# Patient Record
Sex: Male | Born: 1950 | Race: White | Hispanic: Yes | Smoking: Former smoker
Health system: Southern US, Community
[De-identification: ages and names within clinical notes are randomized; demographics above are authoritative.]

## PROBLEM LIST (undated history)

## (undated) DIAGNOSIS — I4891 Unspecified atrial fibrillation: Secondary | ICD-10-CM

## (undated) DIAGNOSIS — J45909 Unspecified asthma, uncomplicated: Secondary | ICD-10-CM

## (undated) DIAGNOSIS — Z993 Dependence on wheelchair: Secondary | ICD-10-CM

## (undated) DIAGNOSIS — I10 Essential (primary) hypertension: Secondary | ICD-10-CM

## (undated) DIAGNOSIS — F1011 Alcohol abuse, in remission: Secondary | ICD-10-CM

## (undated) DIAGNOSIS — Z87442 Personal history of urinary calculi: Secondary | ICD-10-CM

## (undated) DIAGNOSIS — M1990 Unspecified osteoarthritis, unspecified site: Secondary | ICD-10-CM

## (undated) HISTORY — PX: HIP SURGERY: SHX245

## (undated) HISTORY — PX: ANKLE SURGERY: SHX546

## (undated) HISTORY — PX: KNEE SURGERY: SHX244

---

## 2020-02-06 DIAGNOSIS — N401 Enlarged prostate with lower urinary tract symptoms: Secondary | ICD-10-CM | POA: Insufficient documentation

## 2020-02-06 DIAGNOSIS — E119 Type 2 diabetes mellitus without complications: Secondary | ICD-10-CM | POA: Insufficient documentation

## 2020-02-06 DIAGNOSIS — I10 Essential (primary) hypertension: Secondary | ICD-10-CM | POA: Diagnosis present

## 2020-02-06 DIAGNOSIS — G500 Trigeminal neuralgia: Secondary | ICD-10-CM | POA: Insufficient documentation

## 2020-02-06 DIAGNOSIS — I4891 Unspecified atrial fibrillation: Secondary | ICD-10-CM | POA: Diagnosis present

## 2020-02-06 DIAGNOSIS — Z7901 Long term (current) use of anticoagulants: Secondary | ICD-10-CM

## 2020-02-06 DIAGNOSIS — I480 Paroxysmal atrial fibrillation: Secondary | ICD-10-CM | POA: Diagnosis present

## 2020-02-06 DIAGNOSIS — J45909 Unspecified asthma, uncomplicated: Secondary | ICD-10-CM | POA: Insufficient documentation

## 2020-02-06 DIAGNOSIS — M109 Gout, unspecified: Secondary | ICD-10-CM | POA: Insufficient documentation

## 2020-02-06 DIAGNOSIS — G8929 Other chronic pain: Secondary | ICD-10-CM | POA: Insufficient documentation

## 2020-02-06 DIAGNOSIS — F1011 Alcohol abuse, in remission: Secondary | ICD-10-CM | POA: Insufficient documentation

## 2020-02-19 ENCOUNTER — Ambulatory Visit: Payer: Self-pay | Admitting: Cardiology

## 2020-07-02 ENCOUNTER — Other Ambulatory Visit: Payer: Self-pay

## 2020-07-02 ENCOUNTER — Ambulatory Visit (HOSPITAL_COMMUNITY)
Admission: RE | Admit: 2020-07-02 | Discharge: 2020-07-02 | Disposition: A | Discharge status: Home / Self Care | Payer: Medicare Other | Source: Ambulatory Visit | Attending: Internal Medicine | Admitting: Internal Medicine

## 2020-07-02 ENCOUNTER — Other Ambulatory Visit (HOSPITAL_COMMUNITY): Payer: Self-pay | Admitting: Internal Medicine

## 2020-07-02 DIAGNOSIS — M7989 Other specified soft tissue disorders: Secondary | ICD-10-CM | POA: Insufficient documentation

## 2020-09-19 DIAGNOSIS — E8809 Other disorders of plasma-protein metabolism, not elsewhere classified: Secondary | ICD-10-CM | POA: Insufficient documentation

## 2020-11-23 ENCOUNTER — Other Ambulatory Visit: Payer: Self-pay

## 2020-11-23 ENCOUNTER — Ambulatory Visit (HOSPITAL_COMMUNITY): Payer: Self-pay

## 2020-11-23 ENCOUNTER — Encounter (HOSPITAL_COMMUNITY): Payer: Self-pay | Admitting: Emergency Medicine

## 2020-11-23 ENCOUNTER — Emergency Department (HOSPITAL_COMMUNITY)
Admission: EM | Admit: 2020-11-23 | Discharge: 2020-11-23 | Disposition: A | Discharge status: Home / Self Care | Payer: Medicare Other | Attending: Student | Admitting: Student

## 2020-11-23 DIAGNOSIS — H1031 Unspecified acute conjunctivitis, right eye: Secondary | ICD-10-CM | POA: Insufficient documentation

## 2020-11-23 DIAGNOSIS — Z5321 Procedure and treatment not carried out due to patient leaving prior to being seen by health care provider: Secondary | ICD-10-CM | POA: Insufficient documentation

## 2020-11-23 DIAGNOSIS — H538 Other visual disturbances: Secondary | ICD-10-CM | POA: Insufficient documentation

## 2020-11-23 HISTORY — DX: Unspecified osteoarthritis, unspecified site: M19.90

## 2020-11-23 HISTORY — DX: Essential (primary) hypertension: I10

## 2020-11-23 LAB — CBC WITH DIFFERENTIAL/PLATELET
Abs Immature Granulocytes: 0.03 10*3/uL (ref 0.00–0.07)
Basophils Absolute: 0.1 10*3/uL (ref 0.0–0.1)
Basophils Relative: 1 %
Eosinophils Absolute: 0.3 10*3/uL (ref 0.0–0.5)
Eosinophils Relative: 4 %
HCT: 38.8 % — ABNORMAL LOW (ref 39.0–52.0)
Hemoglobin: 12.7 g/dL — ABNORMAL LOW (ref 13.0–17.0)
Immature Granulocytes: 0 %
Lymphocytes Relative: 24 %
Lymphs Abs: 1.8 10*3/uL (ref 0.7–4.0)
MCH: 31.1 pg (ref 26.0–34.0)
MCHC: 32.7 g/dL (ref 30.0–36.0)
MCV: 95.1 fL (ref 80.0–100.0)
Monocytes Absolute: 0.6 10*3/uL (ref 0.1–1.0)
Monocytes Relative: 8 %
Neutro Abs: 4.9 10*3/uL (ref 1.7–7.7)
Neutrophils Relative %: 63 %
Platelets: 203 10*3/uL (ref 150–400)
RBC: 4.08 MIL/uL — ABNORMAL LOW (ref 4.22–5.81)
RDW: 15 % (ref 11.5–15.5)
WBC: 7.7 10*3/uL (ref 4.0–10.5)
nRBC: 0 % (ref 0.0–0.2)

## 2020-11-23 LAB — BASIC METABOLIC PANEL
Anion gap: 7 (ref 5–15)
BUN: 12 mg/dL (ref 8–23)
CO2: 29 mmol/L (ref 22–32)
Calcium: 8.7 mg/dL — ABNORMAL LOW (ref 8.9–10.3)
Chloride: 107 mmol/L (ref 98–111)
Creatinine, Ser: 0.84 mg/dL (ref 0.61–1.24)
GFR, Estimated: >60 mL/min (ref >60)
Glucose, Bld: 92 mg/dL (ref 70–99)
Potassium: 3.5 mmol/L (ref 3.5–5.1)
Sodium: 143 mmol/L (ref 135–145)

## 2020-11-23 NOTE — ED Notes (Signed)
Pt stated "he was going to have to leave. He has his father at home with sundowners and he needed to get home to take care of him". Moving him off the floor.

## 2020-11-23 NOTE — ED Triage Notes (Signed)
C/o R eye infection since Tuesday.  Using antibiotic ointment since Wednesday.  Reports blurred vision.

## 2020-11-23 NOTE — ED Provider Notes (Signed)
Patient placed in Quick Look pathway, seen and evaluated   Chief Complaint: R eye infection  HPI: 70 year old male arrives with worsening right eye infection.  He has been using her conjunctivitis since Wednesday but feels that the eye infection worsening over the weekend despite using antibiotics.  He reports increasing redness and swelling of the eye and increasing purulent drainage.  Blurred vision reported.  ROS: + Eye pain, redness and drainage, blurry vision  -Fever  Physical Exam:   Gen: No distress  Neuro: Awake and Alert  Skin: Warm    Focused Exam: Right is erythematous with purulent drainage noted, no proptosis, slight periorbital swelling, no crepitus or significant periorbital tenderness   Initiation of care has begun. The patient has been counseled on the process, plan, and necessity for staying for the completion/evaluation, and the remainder of the medical screening examination MSE was initiated and I personally evaluated the patient and placed orders (if any) at  3:26 PM on November 23, 2020.  The patient appears stable so that the remainder of the MSE may be completed by another provider.   Dartha Lodge, PA-C 11/23/20 1539    Koleen Distance, MD 11/23/20 [phone]

## 2020-11-28 DIAGNOSIS — Z9889 Other specified postprocedural states: Secondary | ICD-10-CM | POA: Insufficient documentation

## 2020-12-01 ENCOUNTER — Other Ambulatory Visit: Payer: Self-pay

## 2020-12-01 ENCOUNTER — Ambulatory Visit: Payer: Medicare Other | Attending: Internal Medicine

## 2020-12-01 DIAGNOSIS — M6281 Muscle weakness (generalized): Secondary | ICD-10-CM | POA: Diagnosis present

## 2020-12-01 DIAGNOSIS — M25571 Pain in right ankle and joints of right foot: Secondary | ICD-10-CM | POA: Diagnosis present

## 2020-12-01 DIAGNOSIS — R269 Unspecified abnormalities of gait and mobility: Secondary | ICD-10-CM | POA: Diagnosis present

## 2020-12-02 NOTE — Therapy (Signed)
[facility] [address] [place], [place], [address] Phone: [phone]   Fax:  [phone]  Physical Therapy Evaluation  Patient Details  Name: Taylor Myers MRN: [id number] Date of Birth: [birth year] Referring Provider (PT): Tisovec, Adelfa Koh, MD   Encounter Date: 12/01/2020   PT End of Session - 12/01/20 1453    Visit Number 1    Number of Visits 13    Date for PT Re-Evaluation 01/24/21    Authorization Type MEDICARE PART A AND B; [employer] OTHER    Authorization Time Period Re-assess FOTO on the 6th and 10th visits    PT Start Time 1400    PT Stop Time 1455    PT Time Calculation (min) 55 min    Equipment Utilized During Treatment Other (comment)   manual WC   Activity Tolerance Patient tolerated treatment well    Behavior During Therapy [facility] for tasks assessed/performed           Past Medical History:  Diagnosis Date  . Arthritis   . Hypertension     Past Surgical History:  Procedure Laterality Date  . ANKLE SURGERY    . HIP SURGERY    . KNEE SURGERY      There were no vitals filed for this visit.    Subjective Assessment - 12/02/20 0940    Subjective Pt reports a decline in strength, balance, and functional mobility over the last 9 months related to various health problems. Pt notes losing 75lbs during this time frame. See below pertintent Hx. of Trigeminal nerve pain affected eating, while R ankle pain limited activity level. Pt reports his 72 yo father lives with him. Pt expressed a desire to participate in aquatics rehab.    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 Chronic isses- R THA, L and R TKA, R ankle re-built with chronic pain.    Limitations Lifting;Standing;Walking;House hold activities    How long can you sit comfortably? Not an issue    How long can you stand  comfortably? Not long    How long can you walk comfortably? 60 ft    Patient Stated Goals To improve strength so he is able to walk better, assess the community more easily, and do more around his home with cleaning and cooking    Currently in Pain? Yes    Pain Score 4    Pain range 4-7/10   Pain Location Ankle    Pain Orientation Right    Pain Descriptors / Indicators Aching;Sharp    Pain Type Chronic pain    Pain Onset More than a month ago    Pain Frequency Constant    Aggravating Factors  wt bearing    Pain Relieving Factors rest              [facility] PT Assessment - 12/02/20 0001      Assessment   Medical Diagnosis Muscle weakness (generalized); Unspecified abnormalities of gait and mobility    Referring Provider (PT) Tisovec, Adelfa Koh, MD    Onset Date/Surgical Date --   9 months   Hand Dominance Right    Prior Therapy yes      Precautions   Precautions None      Restrictions   Weight Bearing Restrictions No      Balance Screen   Has the patient fallen in the past 6 months No  Home Environment   Living Environment Private residence    Living Arrangements Parent   39 yo father   Type of Home House    Home Access Elevator    Home Layout One level    Home Equipment Wheelchair - manual;Wheelchair - power;Grab bars - tub/shower;Grab bars - toilet;Walker - 2 wheels;Tub bench;Hand held shower head      Prior Function   Level of Independence Independent with basic ADLs    Vocation Retired;On disability      Cognition   Overall Cognitive Status Within Functional Limits for tasks assessed      Observation/Other Assessments   Focus on Therapeutic Outcomes (FOTO)  36% functional ability      Sensation   Light Touch Appears Intact      Posture/Postural Control   Posture/Postural Control Postural limitations    Postural Limitations Flexed trunk   flexed hips and knees     ROM / Strength   AROM / PROM / Strength AROM;Strength      AROM   AROM Assessment  Site Shoulder;Knee    Right/Left Shoulder Right;Left    Right Shoulder Flexion 100 Degrees    Left Shoulder Flexion 100 Degrees    Left Shoulder ABduction --    Right/Left Knee Right;Left    Right Knee Extension -20    Left Knee Extension -20      Strength   Overall Strength Comments B Shoulder flexion = 4/5 bilat; B elb = 5/5; B grip = 5/5; B hip = 4/5; B knee 4+/5; R ankle 4/5, L ankle 5/5      Palpation   Palpation comment TTP to the R ankle area; muscle atrophy noted of the post shoulder girdle      Transfers   Transfers Sit to Stand;Stand to Sit    Sit to Stand 6: Modified independent (Device/Increase time)   squat pivot     Ambulation/Gait   Ambulation/Gait Yes    Ambulation/Gait Assistance 5: Supervision    Ambulation Distance (Feet) 60 Feet    Assistive device Rolling walker    Gait Pattern Step-to pattern   knees flexed with forward flexed trunk.   Gait velocity Decreased pace                      Objective measurements completed on examination: See above findings.               PT Education - 12/02/20 9937    Education Details Eval findings, POC, to continue with HEP provided by HHPT, use of RICE for pain management with R ankle.    Person(s) Educated Patient    Methods Explanation    Comprehension Verbalized understanding            PT Short Term Goals - 12/02/20 1610      PT SHORT TERM GOAL #1   Title Pt will be Ind in an initial HEP    Status New    Target Date 12/23/20      PT SHORT TERM GOAL #2   Title Pt will voice understanding of measure for the management of R ankle pain.    Status New    Target Date 12/23/20             PT Long Term Goals - 12/02/20 1611      PT LONG TERM GOAL #1   Title Pt will be Ind in a final HEP to maintain or progress achieved LOF  Status New    Target Date 01/24/21      PT LONG TERM GOAL #2   Title Improve B shoulder, B hip, B knee and R ankle strength by at least .5 MMT grade     Baseline see flowsheets    Status New    Target Date 01/24/21      PT LONG TERM GOAL #3   Title Pt will improve his walking distance to 300 ft c a RW for improved function for community ambulation    Baseline 60 ft with RW    Status New    Target Date 01/24/21      PT LONG TERM GOAL #4   Title Pt's FOTO score for functional ability will improved to predicted value of 44%    Baseline 36% functional ability    Status New    Target Date 01/24/21                  Plan - 12/02/20 0925    Clinical Impression Statement Pt presents to PT using a WC for mobility. Pt's functional mobility is impacted my weakness of mutliple areas and R ankle pain. Pt is able to walk 73ft c supervision for safety with his distance limited by both weakness and chronic R ankle pain. Pt will benefit from PT 2w6 for strengthening and balance to improve his able to manage his home and to progress to a community ambulator walking 300 ft with a RW. Degree of R ankle pain may limit progress. Will make referral to aquatic therapy.    Personal Factors and Comorbidities Past/Current Experience;Social Background;Comorbidity 1;Comorbidity 2    Comorbidities arthritis, HTN    Examination-Activity Limitations Caring for Others;Carry;Stand;Stairs;Squat;Transfers;Locomotion Level;Lift    Stability/Clinical Decision Making Evolving/Moderate complexity    Clinical Decision Making Moderate    Rehab Potential Fair    PT Frequency 2x / week    PT Duration 6 weeks    PT Treatment/Interventions ADLs/Self Care Home Management;Cryotherapy;Physicist, medical;Therapeutic exercise;Therapeutic activities;Functional mobility training;Stair training;Gait training;Patient/family education;Manual techniques;Dry needling;Passive range of motion;Taping;Vasopneumatic Device;Joint Manipulations;Aquatic Therapy    PT Next Visit Plan Initiate HEP. Complete TUG and 5xSTS and set goals. Review results of  FOTO. Make referral to aquatic therapy.    Consulted and Agree with Plan of Care Patient           Patient will benefit from skilled therapeutic intervention in order to improve the following deficits and impairments:  Abnormal gait,Decreased range of motion,Difficulty walking,Decreased activity tolerance,Pain,Decreased strength,Postural dysfunction  Visit Diagnosis: Muscle weakness (generalized) - Plan: PT plan of care cert/re-cert  Unspecified abnormalities of gait and mobility - Plan: PT plan of care cert/re-cert  Pain in right ankle and joints of right foot - Plan: PT plan of care cert/re-cert     Problem List There are no problems to display for this patient.   Joellyn Rued MS, PT 12/02/20 4:26 PM   [facility] Out[facility] Rehabilitation [facility] [address] [place], [place], [address] Phone: [phone]   Fax:  [phone]  Name: Taylor Myers MRN: [id number] Date of Birth: [birth year]

## 2020-12-16 ENCOUNTER — Other Ambulatory Visit: Payer: Self-pay

## 2020-12-16 ENCOUNTER — Ambulatory Visit: Payer: Medicare Other | Admitting: Physical Therapy

## 2020-12-16 DIAGNOSIS — M25571 Pain in right ankle and joints of right foot: Secondary | ICD-10-CM

## 2020-12-16 DIAGNOSIS — M6281 Muscle weakness (generalized): Secondary | ICD-10-CM | POA: Diagnosis not present

## 2020-12-16 DIAGNOSIS — R269 Unspecified abnormalities of gait and mobility: Secondary | ICD-10-CM

## 2020-12-16 NOTE — Therapy (Signed)
[facility]patient [facility] [address] [place], [place], [address] Phone: [phone]   Fax:  [phone]  Physical Therapy Treatment  Patient Details  Name: Taylor Myers MRN: [id number] Date of Birth: Jul 03, 1951 Referring Provider (PT): Tisovec, Adelfa Koh, MD   Encounter Date: 12/16/2020   PT End of Session - 12/16/20 1302    Visit Number 2    Number of Visits 13    Date for PT Re-Evaluation 01/24/21    Authorization Type MEDICARE PART A AND B; [employer] HE[employer] OTHER    Authorization Time Period Re-assess FOTO on the 6th and 10th visits    PT Start Time 1150    PT Stop Time 1235    PT Time Calculation (min) 45 min    Equipment Utilized During Treatment Other (comment)   manual WC   Activity Tolerance Patient tolerated treatment well    Behavior During Therapy [facility] for tasks assessed/performed           Past Medical History:  Diagnosis Date  . Arthritis   . Hypertension     Past Surgical History:  Procedure Laterality Date  . ANKLE SURGERY    . HIP SURGERY    . KNEE SURGERY      There were no vitals filed for this visit.   Subjective Assessment - 12/16/20 1156    Subjective Pt states he got a R eye implant yesterday -- no precautions. Pt reports he will see orthopedist in 3 weeks to address R ankle.    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 Chronic isses- R THA, L and R TKA, R ankle re-built with chronic pain.    Limitations Lifting;Standing;Walking;House hold activities    How long can you sit comfortably? Not an issue    How long can you stand comfortably? Not long    How long can you walk comfortably? 60 ft    Patient Stated Goals To improve strength so he is able to walk better, assess the community more easily, and do more around his home with cleaning and cooking    Pain Onset More than a month ago               [facility] PT Assessment - 12/16/20 0001      Balance   Balance Assessed Yes      Standardized Balance Assessment   Standardized Balance Assessment Timed Up and Go Test      Timed Up and Go Test   Normal TUG (seconds) 29                         [facility] Adult PT Treatment/Exercise - 12/16/20 0001      Transfers   Five time sit to stand comments  13 sec   Needs use of UEs; had at least 2 LOBs     Ambulation/Gait   Ambulation Distance (Feet) 25 Feet    Assistive device Rolling walker    Gait Pattern Step-through pattern   R ankle/tibia everts/externally rotates prior to stance/weightbearing with steppage pattern     Exercises   Exercises Knee/Hip      Knee/Hip Exercises: Stretches   Active Hamstring Stretch Right;Left;30 seconds    Hip Flexor Stretch Both;30 seconds    Hip Flexor Stretch Limitations Prone (modified thomas pulled on low back too much)      Knee/Hip Exercises: Seated  Clamshell with TheraBand Green    Other Seated Knee/Hip Exercises SLR 2x10 bilat    Other Seated Knee/Hip Exercises Quad set x5 on R    Sit to Sand with UE support;20 reps   "push through heels"     Manual Therapy   Manual Therapy Joint mobilization    Joint Mobilization grade III inferior and lateral hip mob bilat                    PT Short Term Goals - 12/16/20 1256      PT SHORT TERM GOAL #1   Title Pt will be Ind in an initial HEP    Status New    Target Date 12/23/20      PT SHORT TERM GOAL #2   Title Pt will voice understanding of measure for the management of R ankle pain.    Status New    Target Date 12/23/20      PT SHORT TERM GOAL #3   Title Pt will have improved TUG score to </=15 sec    Baseline 20 sec on 12/16/20    Status New    Target Date 01/06/21      PT SHORT TERM GOAL #4   Title Pt will have improved 5x STS to </=13 sec without use of UEs    Baseline 13 sec with UE support and 2 LOBs    Status New    Target Date 01/06/21              PT Long Term Goals - 12/02/20 1611      PT LONG TERM GOAL #1   Title Pt will be Ind in a final HEP to maintain or progress achieved LOF    Status New    Target Date 01/24/21      PT LONG TERM GOAL #2   Title Improve B shoulder, B hip, B knee and R ankle strength by at least .5 MMT grade    Baseline see flowsheets    Status New    Target Date 01/24/21      PT LONG TERM GOAL #3   Title Pt will improve his walking distance to 300 ft c a RW for improved function for community ambulation    Baseline 60 ft with RW    Status New    Target Date 01/24/21      PT LONG TERM GOAL #4   Title Pt's FOTO score for functional ability will improved to predicted value of 44%    Baseline 36% functional ability    Status New    Target Date 01/24/21                 Plan - 12/16/20 1252    Clinical Impression Statement Treatment focused on initiating strengthening HEP. On attempting clamshell, pt notes hip tightness. Provided manual therapy and stretching. Discussed aquatics. Assessed 5x STS and TUG.    Personal Factors and Comorbidities Past/Current Experience;Social Background;Comorbidity 1;Comorbidity 2    Comorbidities arthritis, HTN    Examination-Activity Limitations Caring for Others;Carry;Stand;Stairs;Squat;Transfers;Locomotion Level;Lift    Stability/Clinical Decision Making Evolving/Moderate complexity    Rehab Potential Fair    PT Frequency 2x / week    PT Duration 6 weeks    PT Treatment/Interventions ADLs/Self Care Home Management;Cryotherapy;Physicist, medical;Therapeutic exercise;Therapeutic activities;Functional mobility training;Stair training;Gait training;Patient/family education;Manual techniques;Dry needling;Passive range of motion;Taping;Vasopneumatic Device;Joint Manipulations;Aquatic Therapy    PT Next Visit Plan Assess response to HEP. Review results of FOTO. Continue  knee and hip strengthening. Add hip  stretches and mobilizations as needed. Follow-up on aquatics.    PT Home Exercise Plan Access Code: [email or web address]    Consulted and Agree with Plan of Care Patient           Patient will benefit from skilled therapeutic intervention in order to improve the following deficits and impairments:  Abnormal gait,Decreased range of motion,Difficulty walking,Decreased activity tolerance,Pain,Decreased strength,Postural dysfunction  Visit Diagnosis: Muscle weakness (generalized)  Unspecified abnormalities of gait and mobility  Pain in right ankle and joints of right foot     Problem List There are no problems to display for this patient.   [facility] [address] PT, DPT 12/16/2020, 1:04 PM  [facility] [address] [place], [place], [address] Phone: [phone]   Fax:  [phone]  Name: Jakob Kimberlin MRN: [id number] Date of Birth: [birth year]

## 2020-12-18 ENCOUNTER — Telehealth: Payer: Self-pay | Admitting: Internal Medicine

## 2020-12-18 NOTE — Telephone Encounter (Signed)
   Taylor Myers DOB: 26-Apr-1951 MRN: [id number]   RIDER WAIVER AND RELEASE OF LIABILITY  For purposes of improving physical access to our facilities, [facility] is pleased to partner with third parties to provide [facility] patients or other authorized individuals the option of convenient, on-demand ground transportation services (the [employer]") through use of the technology service that enables users to request on-demand ground transportation from independent third-party providers.  By opting to use and accept these [employer], I, the undersigned, hereby agree on behalf of myself, and on behalf of any minor child using the [employer] for whom I am the parent or legal guardian, as follows:  1. Science writer provided to me are provided by independent third-party transportation providers who are not [employer] or employees and who are unaffiliated with [employer]. 2. [facility] is neither a transportation carrier nor a common or public carrier. 3. [facility] has no control over the quality or safety of the transportation that occurs as a result of the [employer]. 4. [facility] [facility] cannot guarantee that any third-party transportation provider will complete any arranged transportation service. 5. [facility] makes no representation, warranty, or guarantee regarding the reliability, timeliness, quality, safety, suitability, or availability of any of the Transport Services or that they will be error free. 6. I fully understand that traveling by vehicle involves risks and dangers of serious bodily injury, including permanent disability, paralysis, and death. I agree, on behalf of myself and on behalf of any minor child using the Transport Services for whom I am the parent or legal guardian, that the entire risk arising out of my use of the [employer] remains solely with me, to the maximum extent permitted under applicable law. 7. The [employer]  are provided "as is" and "as available." [facility] disclaims all representations and warranties, express, implied or statutory, not expressly set out in these terms, including the implied warranties of merchantability and fitness for a particular purpose. 8. I hereby waive and release [facility], its agents, employees, officers, directors, representatives, insurers, attorneys, assigns, successors, subsidiaries, and affiliates from any and all past, present, or future claims, demands, liabilities, actions, causes of action, or suits of any kind directly or indirectly arising from acceptance and use of the [employer]. 9. I further waive and release [facility] and its affiliates from all present and future liability and responsibility for any injury or death to persons or damages to property caused by or related to the use of the [employer]. 10. I have read this Waiver and Release of Liability, and I understand the terms used in it and their legal significance. This Waiver is freely and voluntarily given with the understanding that my right (as well as the right of any minor child for whom I am the parent or legal guardian using the [employer]) to legal recourse against [place] in connection with the [employer] is knowingly surrendered in return for use of these services.   I attest that I read the consent document to Taylor Myers, gave Taylor Myers the opportunity to ask questions and answered the questions asked (if any). I affirm that Taylor Myers then provided consent for he's participation in this program.     Taylor Myers

## 2020-12-19 ENCOUNTER — Other Ambulatory Visit: Payer: Self-pay

## 2020-12-19 ENCOUNTER — Ambulatory Visit: Payer: Medicare Other | Admitting: Physical Therapy

## 2020-12-19 DIAGNOSIS — M25571 Pain in right ankle and joints of right foot: Secondary | ICD-10-CM

## 2020-12-19 DIAGNOSIS — R269 Unspecified abnormalities of gait and mobility: Secondary | ICD-10-CM

## 2020-12-19 DIAGNOSIS — M6281 Muscle weakness (generalized): Secondary | ICD-10-CM | POA: Diagnosis not present

## 2020-12-19 NOTE — Therapy (Addendum)
[place], [place], [address] Phone: [phone]   Fax:  [phone]  Physical Therapy Treatment/Discharge  Patient Details  Name: Taylor Myers MRN: [id number] Date of Birth: [birth year] Referring Provider (PT): Tisovec, Fransico Him, MD   Encounter Date: 12/19/2020   PT End of Session - 12/19/20 1157    Visit Number 3    Number of Visits 13    Date for PT Re-Evaluation 01/24/21    Authorization Type MEDICARE PART A AND B; [employer] OTHER    Authorization Time Period Re-assess FOTO on the 6th and 10th visits    PT Start Time 1100    PT Stop Time 1145    PT Time Calculation (min) 45 min    Equipment Utilized During Treatment Other (comment)   manual WC   Activity Tolerance Patient tolerated treatment well    Behavior During Therapy [facility] for tasks assessed/performed           Past Medical History:  Diagnosis Date  . Arthritis   . Hypertension     Past Surgical History:  Procedure Laterality Date  . ANKLE SURGERY    . HIP SURGERY    . KNEE SURGERY      There were no vitals filed for this visit.   Subjective Assessment - 12/19/20 1104    Subjective Pt will f/u with opthamologist to see if it would be okay to do aquatics. Otherwise, pt is okay to do aquatics at [employer] with Denton Meek. Pt reports he was okay after last session. Pt states he was able to do everything but the hip stretch.    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 Chronic isses- R THA, L and R TKA, R ankle re-built with chronic pain.    Limitations Lifting;Standing;Walking;House hold activities    How long can you sit comfortably? Not an issue    How long can you stand comfortably? Not long    How long can you walk comfortably? 12 ft    Patient Stated Goals To improve strength so he is able to walk  better, assess the community more easily, and do more around his home with cleaning and cooking    Pain Onset More than a month ago                             [facility] Adult PT Treatment/Exercise - 12/19/20 0001      Knee/Hip Exercises: Aerobic   Nustep L5 x 5 min      Knee/Hip Exercises: Standing   Other Standing Knee Exercises Attempted standing with ankle inversion x10      Knee/Hip Exercises: Seated   Other Seated Knee/Hip Exercises ankle inversion 2x10 yellow tband on R      Knee/Hip Exercises: Supine   Bridges Strengthening;Both;2 sets;10 reps      Knee/Hip Exercises: Sidelying   Clams green tband 2x10 bilat      Manual Therapy   Manual Therapy Joint mobilization    Joint Mobilization grade III inferior and lateral hip mob bilat; patellar mobilizatoins bilat in all directions; R ankle talocrural distraction & PA mobs                    PT Short Term Goals - 12/16/20 1256      PT SHORT TERM GOAL #1  Title Pt will be Ind in an initial HEP    Status New    Target Date 12/23/20      PT SHORT TERM GOAL #2   Title Pt will voice understanding of measure for the management of R ankle pain.    Status New    Target Date 12/23/20      PT SHORT TERM GOAL #3   Title Pt will have improved TUG score to </=15 sec    Baseline 20 sec on 12/16/20    Status New    Target Date 01/06/21      PT SHORT TERM GOAL #4   Title Pt will have improved 5x STS to </=13 sec without use of UEs    Baseline 13 sec with UE support and 2 LOBs    Status New    Target Date 01/06/21             PT Long Term Goals - 12/02/20 1611      PT LONG TERM GOAL #1   Title Pt will be Ind in a final HEP to maintain or progress achieved LOF    Status New    Target Date 01/24/21      PT LONG TERM GOAL #2   Title Improve B shoulder, B hip, B knee and R ankle strength by at least .5 MMT grade    Baseline see flowsheets    Status New    Target Date 01/24/21      PT LONG  TERM GOAL #3   Title Pt will improve his walking distance to 300 ft c a RW for improved function for community ambulation    Baseline 60 ft with RW    Status New    Target Date 01/24/21      PT LONG TERM GOAL #4   Title Pt's FOTO score for functional ability will improved to predicted value of 44%    Baseline 36% functional ability    Status New    Target Date 01/24/21                 Plan - 12/19/20 1155    Clinical Impression Statement Treatment focused on reviewing hip exercises. Added bridges and ankle strengthening this session. Initiated manual therapy and mobilizations to improve joint motion.    Personal Factors and Comorbidities Past/Current Experience;Social Background;Comorbidity 1;Comorbidity 2    Comorbidities arthritis, HTN    Examination-Activity Limitations Caring for Others;Carry;Stand;Stairs;Squat;Transfers;Locomotion Level;Lift    Stability/Clinical Decision Making Evolving/Moderate complexity    Rehab Potential Fair    PT Frequency 2x / week    PT Duration 6 weeks    PT Treatment/Interventions ADLs/Self Care Home Management;Cryotherapy;Presenter, broadcasting;Therapeutic exercise;Therapeutic activities;Functional mobility training;Stair training;Gait training;Patient/family education;Manual techniques;Dry needling;Passive range of motion;Taping;Vasopneumatic Device;Joint Manipulations;Aquatic Therapy    PT Next Visit Plan Assess response to HEP. Review results of FOTO. Continue knee and hip strengthening. Add hip stretches and mobilizations as needed. Pt to f/u with opthamologist about if R eye will be good to do aquatics -- can schedule with [facility] [place].    PT Home Exercise Plan Access Code: [email or web address]    Consulted and Agree with Plan of Care Patient           Patient will benefit from skilled therapeutic intervention in order to improve the following deficits and impairments:  Abnormal gait,Decreased range of  motion,Difficulty walking,Decreased activity tolerance,Pain,Decreased strength,Postural dysfunction  Visit Diagnosis: Muscle weakness (generalized)  Unspecified abnormalities of gait and mobility  Pain  in right ankle and joints of right foot     Problem List There are no problems to display for this patient.   Amandeep Hogston April Ma L Jameria Bradway PT, DPT 12/19/2020, 11:58 AM  [facility] [address] [place], [place], [address] Phone: [phone]   Fax:  [phone]  Name: Taylor Myers MRN: [id number] Date of Birth: [birth year]   PHYSICAL THERAPY DISCHARGE SUMMARY  Visits from Start of Care: 3  Current functional level related to goals / functional outcomes: See above   Remaining deficits: See above   Education / Equipment: HEP Plan: Patient agrees to discharge.  Patient goals were not met. Patient is being discharged due to the physician's request.  ?????        DCed due to upcoming cornea surgery. Pt to obtain a referral for return to PT post surgery when indicated.

## 2020-12-23 ENCOUNTER — Ambulatory Visit: Payer: Medicare Other | Admitting: Physical Therapy

## 2020-12-25 ENCOUNTER — Ambulatory Visit: Payer: Medicare Other | Admitting: Physical Therapy

## 2020-12-30 ENCOUNTER — Ambulatory Visit: Payer: Medicare Other | Admitting: Physical Therapy

## 2021-01-01 ENCOUNTER — Ambulatory Visit: Payer: Medicare Other

## 2021-01-06 ENCOUNTER — Ambulatory Visit: Payer: Medicare Other | Attending: Internal Medicine

## 2021-01-07 ENCOUNTER — Telehealth: Payer: Self-pay

## 2021-01-07 NOTE — Telephone Encounter (Signed)
LVM. Advised pt re: attendance policy. 1st no show visit. Reminded of next appt on 01/13/21.

## 2021-01-08 ENCOUNTER — Ambulatory Visit: Payer: Medicare Other

## 2021-01-13 ENCOUNTER — Encounter: Payer: 59 | Admitting: Physical Therapy

## 2021-03-28 ENCOUNTER — Emergency Department (HOSPITAL_COMMUNITY): Payer: Medicare Other

## 2021-03-28 ENCOUNTER — Other Ambulatory Visit: Payer: Self-pay

## 2021-03-28 ENCOUNTER — Emergency Department (HOSPITAL_COMMUNITY)
Admission: EM | Admit: 2021-03-28 | Discharge: 2021-03-28 | Disposition: A | Discharge status: Other Acute Inpatient Hospital | Payer: Medicare Other | Attending: Emergency Medicine | Admitting: Emergency Medicine

## 2021-03-28 DIAGNOSIS — S1093XA Contusion of unspecified part of neck, initial encounter: Secondary | ICD-10-CM

## 2021-03-28 DIAGNOSIS — R040 Epistaxis: Secondary | ICD-10-CM

## 2021-03-28 DIAGNOSIS — R935 Abnormal findings on diagnostic imaging of other abdominal regions, including retroperitoneum: Secondary | ICD-10-CM

## 2021-03-28 DIAGNOSIS — T1490XA Injury, unspecified, initial encounter: Secondary | ICD-10-CM

## 2021-03-28 DIAGNOSIS — S0285XA Fracture of orbit, unspecified, initial encounter for closed fracture: Secondary | ICD-10-CM

## 2021-03-28 DIAGNOSIS — H05829 Myopathy of extraocular muscles, unspecified orbit: Secondary | ICD-10-CM

## 2021-03-28 DIAGNOSIS — S0231XA Fracture of orbital floor, right side, initial encounter for closed fracture: Secondary | ICD-10-CM | POA: Diagnosis not present

## 2021-03-28 DIAGNOSIS — W19XXXA Unspecified fall, initial encounter: Secondary | ICD-10-CM | POA: Insufficient documentation

## 2021-03-28 DIAGNOSIS — Z20822 Contact with and (suspected) exposure to covid-19: Secondary | ICD-10-CM | POA: Diagnosis not present

## 2021-03-28 DIAGNOSIS — Z8679 Personal history of other diseases of the circulatory system: Secondary | ICD-10-CM | POA: Insufficient documentation

## 2021-03-28 DIAGNOSIS — R0789 Other chest pain: Secondary | ICD-10-CM | POA: Insufficient documentation

## 2021-03-28 DIAGNOSIS — Z7901 Long term (current) use of anticoagulants: Secondary | ICD-10-CM | POA: Insufficient documentation

## 2021-03-28 DIAGNOSIS — S0990XA Unspecified injury of head, initial encounter: Secondary | ICD-10-CM | POA: Diagnosis present

## 2021-03-28 DIAGNOSIS — H50689 Extraocular muscle entrapment, unspecified, unspecified eye: Secondary | ICD-10-CM

## 2021-03-28 LAB — CBC
HCT: 37.8 % — ABNORMAL LOW (ref 39.0–52.0)
Hemoglobin: 12.9 g/dL — ABNORMAL LOW (ref 13.0–17.0)
MCH: 34.2 pg — ABNORMAL HIGH (ref 26.0–34.0)
MCHC: 34.1 g/dL (ref 30.0–36.0)
MCV: 100.3 fL — ABNORMAL HIGH (ref 80.0–100.0)
Platelets: 235 10*3/uL (ref 150–400)
RBC: 3.77 MIL/uL — ABNORMAL LOW (ref 4.22–5.81)
RDW: 13.4 % (ref 11.5–15.5)
WBC: 8.6 10*3/uL (ref 4.0–10.5)
nRBC: 0 % (ref 0.0–0.2)

## 2021-03-28 LAB — BASIC METABOLIC PANEL
Anion gap: 10 (ref 5–15)
BUN: 14 mg/dL (ref 8–23)
CO2: 26 mmol/L (ref 22–32)
Calcium: 8.6 mg/dL — ABNORMAL LOW (ref 8.9–10.3)
Chloride: 103 mmol/L (ref 98–111)
Creatinine, Ser: 0.74 mg/dL (ref 0.61–1.24)
GFR, Estimated: >60 mL/min (ref >60)
Glucose, Bld: 104 mg/dL — ABNORMAL HIGH (ref 70–99)
Potassium: 3.7 mmol/L (ref 3.5–5.1)
Sodium: 139 mmol/L (ref 135–145)

## 2021-03-28 LAB — SARS CORONAVIRUS 2 (TAT 6-24 HRS): SARS Coronavirus 2: NEGATIVE

## 2021-03-28 MED ORDER — MORPHINE SULFATE (PF) 4 MG/ML IV SOLN: 4.0000 mg | INTRAVENOUS | Status: DC | PRN

## 2021-03-28 MED ORDER — TETRACAINE HCL 0.5 % OP SOLN
2.0000 [drp] | Freq: Once | OPHTHALMIC | Status: AC
  Administered 2021-03-28: 2 [drp] via OPHTHALMIC
  Filled 2021-03-28: qty 4

## 2021-03-28 MED ORDER — IOHEXOL 350 MG/ML SOLN
100.0000 mL | Freq: Once | INTRAVENOUS | Status: AC | PRN
  Administered 2021-03-28: 100 mL via INTRAVENOUS

## 2021-03-28 MED ORDER — OXYMETAZOLINE HCL 0.05 % NA SOLN
1.0000 | Freq: Once | NASAL | Status: AC
  Administered 2021-03-28: 1 via NASAL
  Filled 2021-03-28: qty 30

## 2021-03-28 NOTE — Progress Notes (Signed)
   03/28/21 0830  Clinical Encounter Type  Visited With Patient not available  Visit Type Initial;Trauma  Referral From Nurse  Consult/Referral To Chaplain   Chaplain responded to Level 2 trauma. Pt being treated and no support person present. No current need for chaplain. Chaplain remains available.   This note was prepared by Chaplain Resident, Tacy Learn, MDiv. Chaplain remains available as needed through the on-call pager: [phone].

## 2021-03-28 NOTE — ED Notes (Signed)
Patient transported to CT, by RN.

## 2021-03-28 NOTE — ED Notes (Signed)
Pt's nose started to bleed again. I notified Greta Doom, PA and Dr Lockie Mola and obtained orders.

## 2021-03-28 NOTE — ED Notes (Signed)
Clean blood off patient face with Saline and Peroxide.

## 2021-03-28 NOTE — ED Notes (Signed)
Pt has a 0.5 in lac on upper lid of left eye. Left is erythematous and has 2+ swelling. Pt has blood in the entire sclera of left eye. Pt denies new vision issues out of left eye. Pt states has HA, but denies dizziness. Pt denies neck and back pain. Pt denies numbness and tingling.

## 2021-03-28 NOTE — Progress Notes (Signed)
Orthopedic Tech Progress Note Patient Details:  Taylor Myers 1951/02/12 [id number] Level 2 Trauma Patient ID: Taylor Myers, male   DOB: 25-Mar-1951, 70 y.o.   MRN: [id number]  Taylor Myers 03/28/2021, 8:48 AM

## 2021-03-28 NOTE — ED Notes (Signed)
Patient transported to CT by this RN 

## 2021-03-28 NOTE — ED Provider Notes (Addendum)
I personally evaluated the patient during the encounter and completed a history, physical, procedures, medical decision making to contribute to the overall care of the patient and decision making for the patient briefly, the patient is a 70 y.o. male is here after a fall.  History of A. fib on blood thinner.  Larey Seat while try to go to the bathroom.  Thinks that he had a bloody nose.  Does not remember much of what happened because he thinks that he lost consciousness when he fell.  Typically uses a wheelchair and overall suspect mechanical fall.  He is overall well-appearing.  There is no active bleeding from his nose.  There is some swelling around the left eye.  May be some mild entrapment with upward gaze and there is some inflammation of the conjunctiva of the left eye.  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  Will get CT scan of head, face, neck check basic labs.  EKG shows atrial fibrillation but rate controlled.  Nasal tampon has been placed in left nare as bleeding was not able to be controlled with Afrin and direct pressure.  This was placed by my PA.  CT scan of face does show left orbital floor fracture with large fracture fragment displaced up to 1 cm into the left maxillary sinus.  The inferior left ocular musculature does extend inferiorly through the white fracture site causing some inflammation and stranding on the left optic nerve.  He appears to still have good vision in the left eye but as previously stated there is some mild entrapment possibly with upward gaze.  Otherwise CT imaging shows may be a hematoma in the left supraclavicular area.  We will get a CTA of the neck, chest, abdomen, pelvis to further evaluate for any other injuries.  We will touch base with ophthalmology.  Please see PA note for further results, evaluation, disposition of the patient.  This chart was dictated using voice recognition software.  Despite best efforts to proofread,  errors can occur which  can change the documentation meaning.    EKG Interpretation  Date/Time:  Saturday March 28 2021 08:57:49 EDT Ventricular Rate:  63 PR Interval:    QRS Duration: 152 QT Interval:  433 QTC Calculation: 444 R Axis:   88 Text Interpretation: Atrial fibrillation Ventricular premature complex Right bundle branch block Confirmed by Lockie Mola, Chilton Sallade ([id number]) on 03/28/2021 9:00:22 AM            Virgina Norfolk, DO 03/28/21 1607    Virgina Norfolk, DO 03/28/21 1113    Agata Lucente, DO 03/28/21 1118

## 2021-03-28 NOTE — ED Provider Notes (Signed)
[facility] EMERGENCY DEPARTMENT Provider Note   CSN: [id number] Arrival date & time: 03/28/21  8115     History No chief complaint on file.   Taylor Myers is a 70 y.o. male.  The history is provided by the patient and medical records. No language interpreter was used.   70 year old male brought here via EMS as a level 2 trauma due to being in a fall on blood thinner medication.  Does have a significant history of atrial fibrillation currently on Eliquis.  Patient reports morning he was trying to get up to use the bathroom and may have fallen and found himself on the ground.  He is unsure if he may have some loss of consciousness as he did not recall exactly what happened however initially he reportedly he was unable to get up from the floor and however was able to call EMS to seek help.  Initially felt that both of his arms were numb but that has since resolved.  He denies having any significant pain at this time but EMS did found patient to have a nosebleed on initial exam.  At this time he denies any significant headache, neck pain, chest pain, trouble breathing, abdominal pain, back pain or pain to his extremities.  Denies any focal numbness or focal weakness.  He is usually wheelchair-bound.  He lives at home with his father.  At this time he denies feeling confused.  Unsure last tetanus status.  No past medical history on file.  There are no problems to display for this patient.   The histories are not reviewed yet. Please review them in the "History" navigator section and refresh this SmartLink.     No family history on file.     Home Medications Prior to Admission medications   Not on File    Allergies    Patient has no allergy information on record.  Review of Systems   Review of Systems  All other systems reviewed and are negative.  Physical Exam Updated Vital Signs BP 120/82   Pulse (!) 54   Temp 98.2 F (36.8 C) (Oral)   Resp (!) 21   SpO2  99%   Physical Exam Vitals and nursing note reviewed.  Constitutional:      General: He is not in acute distress.    Appearance: He is well-developed.  HENT:     Head: Normocephalic.     Comments: No significant scalp tenderness however there are dried blood noted on his hair.  Dried blood noted to left nares and left side of face without any obvious laceration.  Ecchymosis noted to left orbital region with some tenderness to palpation.  No significant midface tenderness and no malocclusion. Eyes:     Extraocular Movements: Extraocular movements intact.     Conjunctiva/sclera:     Right eye: Right conjunctiva is not injected. No chemosis, exudate or hemorrhage.    Left eye: Left conjunctiva is not injected. Chemosis and hemorrhage present. No exudate.    Pupils: Pupils are equal, round, and reactive to light.     Comments: Left eyelid edematous and ecchymotic.  Left subconjunctival hemorrhage.  Cardiovascular:     Rate and Rhythm: Normal rate and regular rhythm.     Pulses: Normal pulses.     Heart sounds: Normal heart sounds.  Pulmonary:     Effort: Pulmonary effort is normal.     Breath sounds: Normal breath sounds.  Chest:     Chest wall: Tenderness (Tenderness to  left upper chest wall on palpation no crepitus or emphysema noted.) present.  Abdominal:     Palpations: Abdomen is soft.  Musculoskeletal:        General: Normal range of motion.     Cervical back: Neck supple.     Comments: Able to move all 4 extremities with equal strength  No significant midline spine tenderness crepitus or step-off  Skin:    Findings: No rash.  Neurological:     Mental Status: He is alert and oriented to person, place, and time.  Psychiatric:        Mood and Affect: Mood normal.    ED Results / Procedures / Treatments   Labs (all labs ordered are listed, but only abnormal results are displayed) Labs Reviewed  BASIC METABOLIC PANEL - Abnormal; Notable for the following components:       Result Value   Glucose, Bld 104 (*)    Calcium 8.6 (*)    All other components within normal limits  CBC - Abnormal; Notable for the following components:   RBC 3.77 (*)    Hemoglobin 12.9 (*)    HCT 37.8 (*)    MCV 100.3 (*)    MCH 34.2 (*)    All other components within normal limits  SARS CORONAVIRUS 2 (TAT 6-24 HRS)    EKG EKG Interpretation  Date/Time:  Saturday March 28 2021 08:57:49 EDT Ventricular Rate:  63 PR Interval:    QRS Duration: 152 QT Interval:  433 QTC Calculation: 444 R Axis:   88 Text Interpretation: Atrial fibrillation Ventricular premature complex Right bundle branch block Confirmed by Lockie Mola, Adam ([id number]) on 03/28/2021 9:00:22 AM  Radiology CT HEAD WO CONTRAST ( )  Result Date: 03/28/2021 CLINICAL DATA:  70 year old male with head, neck and facial injury from syncopal fall. Patient on blood thinners. EXAM: CT HEAD WITHOUT CONTRAST CT MAXILLOFACIAL WITHOUT CONTRAST CT CERVICAL SPINE WITHOUT CONTRAST TECHNIQUE: Multidetector CT imaging of the head, cervical spine, and maxillofacial structures were performed using the standard protocol without intravenous contrast. Multiplanar CT image reconstructions of the cervical spine and maxillofacial structures were also generated. COMPARISON:  None. FINDINGS: CT HEAD FINDINGS Brain: No evidence of acute infarction, hemorrhage, hydrocephalus, extra-axial collection or mass lesion/mass effect. Calcifications in the region of the RIGHT CP angle is noted. Atrophy, mild probable chronic small-vessel white matter ischemic changes and remote LEFT cerebellar infarct noted. Vascular: No hyperdense vessel or unexpected calcification. Skull: No acute skull abnormality. RIGHT suboccipital craniotomy changes are identified. Other: LEFT facial soft tissue swelling is noted. CT MAXILLOFACIAL FINDINGS Osseous: A LEFT orbital floor fracture is noted with large fracture fragment displaced up to 1 cm into the LEFT maxillary sinus. The INFERIOR  LEFT ocular musculature extends inferiorly through the wide fracture site. LEFT Postseptal and intraconal inflammation is identified with stranding along the LEFT optic nerve. No other acute fractures are identified. Multiple dental caries and periapical abscesses are noted. Orbits: LEFT orbital abnormalities as discussed above. The globes retain their spherical shape. The RIGHT orbit is unremarkable. Sinuses: Blood within the LEFT maxillary sinus is noted. The remainder of the paranasal sinuses, mastoid air cells and middle/INNER ears are clear. Soft tissues: LEFT facial soft tissue swelling is noted. CT CERVICAL SPINE FINDINGS Alignment: 2 mm anterolisthesis of C2 on C3, C4 on C5 and 4 mm anterolisthesis of C7 on T1 are all likely degenerative. Skull base and vertebrae: No acute fracture or suspicious bony lesion. Soft tissues and spinal canal: No prevertebral fluid or swelling.  No visible canal hematoma. Disc levels: Moderate to severe multilevel degenerative disc disease/spondylosis noted with mild to moderate multilevel facet arthropathy. This contributes to some degree of central spinal and foraminal narrowing at several levels. Upper chest: A 2.5 cm ill-defined density in the LEFT supraclavicular soft tissues, deep to the sternocleidomastoid likely represents hemorrhage/hematoma. Other: None IMPRESSION: 1. LEFT orbital floor fracture with large fracture fragment displaced up to 1 cm into the LEFT maxillary sinus. The INFERIOR LEFT ocular musculature extends inferiorly through the wide fracture site. LEFT postseptal and intraconal inflammation with stranding along the LEFT optic nerve. 2. 2.5 cm ill-defined density in the LEFT supraclavicular soft tissues, deep to the sternocleidomastoid, likely an area of hemorrhage/hematoma. 3. No evidence of acute intracranial abnormality. Atrophy, mild probable chronic small-vessel white matter ischemic changes and remote LEFT cerebellar infarct. Calcification in the  region of the RIGHT CP angle, which could represent a calcified mass. Correlate with any prior outside studies or symptoms referable to this area. 4. No evidence of acute injury to the cervical spine. Moderate to severe multilevel degenerative changes. Electronically Signed   By: Harmon PierJeffrey  Hu M.D.   On: 03/28/2021 10:00   CT Angio Neck W and/or Wo Contrast  Result Date: 03/28/2021 CLINICAL DATA:  Facial trauma. EXAM: CT ANGIOGRAPHY NECK TECHNIQUE: Multidetector CT imaging of the neck was performed using the standard protocol during bolus administration of intravenous contrast. Multiplanar CT image reconstructions and MIPs were obtained to evaluate the vascular anatomy. Carotid stenosis measurements (when applicable) are obtained utilizing NASCET criteria, using the distal internal carotid diameter as the denominator. CONTRAST:  100mL OMNIPAQUE IOHEXOL 350 MG/ML SOLN COMPARISON:  Head, maxillofacial, and cervical spine CTs 03/28/2021 FINDINGS: Aortic arch: Normal variant aortic arch branching pattern with common origin of the brachiocephalic and left common carotid arteries. Mild atherosclerotic plaque in the aortic arch without a significant arch vessel origin stenosis. Right carotid system: Patent with a moderate amount of calcified plaque at the carotid bifurcation. No evidence of a significant stenosis or dissection. Tortuous mid cervical ICA. Left carotid system: Patent with a large amount of calcified plaque at the carotid bifurcation resulting in 50% proximal ICA stenosis. Tortuous distal cervical ICA. Vertebral arteries: The vertebral arteries are patent and codominant. There is calcified plaque at the left vertebral artery origin without evidence of a significant stenosis. Skeleton: Left orbital floor fracture more fully evaluated on today's earlier maxillofacial CT. Cervical spine more fully evaluated on today's earlier dedicated spine CT. Other neck: Left facial and left neck hematomas as previously  described. Small stones in the left submandibular duct. Upper chest: More fully evaluated on today's chest CT. IMPRESSION: 1. No evidence of acute arterial injury in the neck. 2. 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 3. Aortic Atherosclerosis (ICD10-I70.0). Electronically Signed   By: Sebastian AcheAllen  Grady M.D.   On: 03/28/2021 12:23   CT Cervical Spine Wo Contrast  Result Date: 03/28/2021 CLINICAL DATA:  70 year old male with head, neck and facial injury from syncopal fall. Patient on blood thinners. EXAM: CT HEAD WITHOUT CONTRAST CT MAXILLOFACIAL WITHOUT CONTRAST CT CERVICAL SPINE WITHOUT CONTRAST TECHNIQUE: Multidetector CT imaging of the head, cervical spine, and maxillofacial structures were performed using the standard protocol without intravenous contrast. Multiplanar CT image reconstructions of the cervical spine and maxillofacial structures were also generated. COMPARISON:  None. FINDINGS: CT HEAD FINDINGS Brain: No evidence of acute infarction, hemorrhage, hydrocephalus, extra-axial collection or mass lesion/mass effect. Calcifications in the region of the RIGHT CP angle is noted.  Atrophy, mild probable chronic small-vessel white matter ischemic changes and remote LEFT cerebellar infarct noted. Vascular: No hyperdense vessel or unexpected calcification. Skull: No acute skull abnormality. RIGHT suboccipital craniotomy changes are identified. Other: LEFT facial soft tissue swelling is noted. CT MAXILLOFACIAL FINDINGS Osseous: A LEFT orbital floor fracture is noted with large fracture fragment displaced up to 1 cm into the LEFT maxillary sinus. The INFERIOR LEFT ocular musculature extends inferiorly through the wide fracture site. LEFT Postseptal and intraconal inflammation is identified with stranding along the LEFT optic nerve. No other acute fractures are identified. Multiple dental caries and periapical abscesses are noted. Orbits: LEFT orbital abnormalities as discussed above. The globes retain  their spherical shape. The RIGHT orbit is unremarkable. Sinuses: Blood within the LEFT maxillary sinus is noted. The remainder of the paranasal sinuses, mastoid air cells and middle/INNER ears are clear. Soft tissues: LEFT facial soft tissue swelling is noted. CT CERVICAL SPINE FINDINGS Alignment: 2 mm anterolisthesis of C2 on C3, C4 on C5 and 4 mm anterolisthesis of C7 on T1 are all likely degenerative. Skull base and vertebrae: No acute fracture or suspicious bony lesion. Soft tissues and spinal canal: No prevertebral fluid or swelling. No visible canal hematoma. Disc levels: Moderate to severe multilevel degenerative disc disease/spondylosis noted with mild to moderate multilevel facet arthropathy. This contributes to some degree of central spinal and foraminal narrowing at several levels. Upper chest: A 2.5 cm ill-defined density in the LEFT supraclavicular soft tissues, deep to the sternocleidomastoid likely represents hemorrhage/hematoma. Other: None IMPRESSION: 1. LEFT orbital floor fracture with large fracture fragment displaced up to 1 cm into the LEFT maxillary sinus. The INFERIOR LEFT ocular musculature extends inferiorly through the wide fracture site. LEFT postseptal and intraconal inflammation with stranding along the LEFT optic nerve. 2. 2.5 cm ill-defined density in the LEFT supraclavicular soft tissues, deep to the sternocleidomastoid, likely an area of hemorrhage/hematoma. 3. No evidence of acute intracranial abnormality. Atrophy, mild probable chronic small-vessel white matter ischemic changes and remote LEFT cerebellar infarct. Calcification in the region of the RIGHT CP angle, which could represent a calcified mass. Correlate with any prior outside studies or symptoms referable to this area. 4. No evidence of acute injury to the cervical spine. Moderate to severe multilevel degenerative changes. Electronically Signed   By: Harmon Pier M.D.   On: 03/28/2021 10:00   CT CHEST ABDOMEN PELVIS W  CONTRAST  Result Date: 03/28/2021 CLINICAL DATA:  70 year old male with chest, abdomen and pelvic pain following fall. Patient on blood thinner. EXAM: CT CHEST, ABDOMEN, AND PELVIS WITH CONTRAST TECHNIQUE: Multidetector CT imaging of the chest, abdomen and pelvis was performed following the standard protocol during bolus administration of intravenous contrast. CONTRAST:  OMNIPAQUE IOHEXOL 350 MG/ML SOLN COMPARISON:  None FINDINGS: CT CHEST FINDINGS Cardiovascular: Cardiomegaly identified. Coronary artery and aortic atherosclerotic calcification noted without evidence of thoracic aortic aneurysm. No pericardial effusion. Mediastinum/Nodes: Ill-defined density in the LEFT supraclavicular region measuring up to 2.5 cm may represent a small area of ill-defined hemorrhage. No active arterial extravasation identified. No mediastinal hematoma. No enlarged mediastinal, hilar, or axillary lymph nodes. Thyroid gland, trachea, and esophagus demonstrate no significant findings. Lungs/Pleura: Mild RIGHT lung scarring noted. There is no evidence of mass, consolidation, airspace disease, nodule, pleural effusion or pneumothorax. Musculoskeletal: No acute or suspicious bony abnormalities are identified. Severe degenerative changes within the shoulders are noted. Degenerative changes in the LOWER cervical spine and LOWER thoracic spine are present. CT ABDOMEN PELVIS FINDINGS Hepatobiliary: The  liver and gallbladder are unremarkable. No biliary dilatation. Pancreas: Unremarkable Spleen: Unremarkable Adrenals/Urinary Tract: Bilateral renal cysts and a nonobstructing 5 mm RIGHT renal calculus are noted. The adrenal glands are unremarkable. There is equivocal wall irregularity along the posterior RIGHT bladder near the UVJ, but only delayed images were performed due to technical difficulties during the scan. No other bladder abnormalities are identified. Stomach/Bowel: Stomach is within normal limits. No evidence of bowel wall  thickening, distention, or inflammatory changes. Vascular/Lymphatic: Aortic atherosclerosis. No enlarged abdominal or pelvic lymph nodes. Reproductive: No definite prostate abnormality. Other: No ascites, focal collection or pneumoperitoneum. Musculoskeletal: RIGHT hip arthroplasty changes obscures some detail within the pelvis. No acute bony abnormalities are identified. Degenerative changes within the lumbar spine and LEFT hip are present. Compression of L4 and L5 appear remote. IMPRESSION: 1. Ill-defined density in the LEFT supraclavicular region measuring up to 2.5 cm, which may represent a small area of ill-defined hemorrhage. No active arterial extravasation identified. 2. No other evidence of acute traumatic injury within the chest abdomen or pelvis. 3. Equivocal wall irregularity along the posterior RIGHT bladder near the UVJ. Recommend urology consultation/possible direct inspection or follow-up to exclude bladder mass/transitional cell carcinoma. 4. Cardiomegaly and coronary artery disease. 5. Nonobstructing 5 mm RIGHT renal calculus. 6. Aortic Atherosclerosis (ICD10-I70.0). Electronically Signed   By: Harmon Pier M.D.   On: 03/28/2021 12:21   CT Maxillofacial Wo Contrast  Result Date: 03/28/2021 CLINICAL DATA:  70 year old male with head, neck and facial injury from syncopal fall. Patient on blood thinners. EXAM: CT HEAD WITHOUT CONTRAST CT MAXILLOFACIAL WITHOUT CONTRAST CT CERVICAL SPINE WITHOUT CONTRAST TECHNIQUE: Multidetector CT imaging of the head, cervical spine, and maxillofacial structures were performed using the standard protocol without intravenous contrast. Multiplanar CT image reconstructions of the cervical spine and maxillofacial structures were also generated. COMPARISON:  None. FINDINGS: CT HEAD FINDINGS Brain: No evidence of acute infarction, hemorrhage, hydrocephalus, extra-axial collection or mass lesion/mass effect. Calcifications in the region of the RIGHT CP angle is noted.  Atrophy, mild probable chronic small-vessel white matter ischemic changes and remote LEFT cerebellar infarct noted. Vascular: No hyperdense vessel or unexpected calcification. Skull: No acute skull abnormality. RIGHT suboccipital craniotomy changes are identified. Other: LEFT facial soft tissue swelling is noted. CT MAXILLOFACIAL FINDINGS Osseous: A LEFT orbital floor fracture is noted with large fracture fragment displaced up to 1 cm into the LEFT maxillary sinus. The INFERIOR LEFT ocular musculature extends inferiorly through the wide fracture site. LEFT Postseptal and intraconal inflammation is identified with stranding along the LEFT optic nerve. No other acute fractures are identified. Multiple dental caries and periapical abscesses are noted. Orbits: LEFT orbital abnormalities as discussed above. The globes retain their spherical shape. The RIGHT orbit is unremarkable. Sinuses: Blood within the LEFT maxillary sinus is noted. The remainder of the paranasal sinuses, mastoid air cells and middle/INNER ears are clear. Soft tissues: LEFT facial soft tissue swelling is noted. CT CERVICAL SPINE FINDINGS Alignment: 2 mm anterolisthesis of C2 on C3, C4 on C5 and 4 mm anterolisthesis of C7 on T1 are all likely degenerative. Skull base and vertebrae: No acute fracture or suspicious bony lesion. Soft tissues and spinal canal: No prevertebral fluid or swelling. No visible canal hematoma. Disc levels: Moderate to severe multilevel degenerative disc disease/spondylosis noted with mild to moderate multilevel facet arthropathy. This contributes to some degree of central spinal and foraminal narrowing at several levels. Upper chest: A 2.5 cm ill-defined density in the LEFT supraclavicular soft tissues, deep to  the sternocleidomastoid likely represents hemorrhage/hematoma. Other: None IMPRESSION: 1. LEFT orbital floor fracture with large fracture fragment displaced up to 1 cm into the LEFT maxillary sinus. The INFERIOR LEFT  ocular musculature extends inferiorly through the wide fracture site. LEFT postseptal and intraconal inflammation with stranding along the LEFT optic nerve. 2. 2.5 cm ill-defined density in the LEFT supraclavicular soft tissues, deep to the sternocleidomastoid, likely an area of hemorrhage/hematoma. 3. No evidence of acute intracranial abnormality. Atrophy, mild probable chronic small-vessel white matter ischemic changes and remote LEFT cerebellar infarct. Calcification in the region of the RIGHT CP angle, which could represent a calcified mass. Correlate with any prior outside studies or symptoms referable to this area. 4. No evidence of acute injury to the cervical spine. Moderate to severe multilevel degenerative changes. Electronically Signed   By: Harmon Pier M.D.   On: 03/28/2021 10:00    Procedures .Epistaxis Management  Date/Time: 03/28/2021 9:52 AM Performed by: Fayrene Helper, PA-C Authorized by: Fayrene Helper, PA-C   Consent:    Consent obtained:  Verbal   Consent given by:  Patient   Risks, benefits, and alternatives were discussed: yes     Risks discussed:  Bleeding, infection, nasal injury and pain   Alternatives discussed:  Alternative treatment Universal protocol:    Procedure explained and questions answered to patient or proxy's satisfaction: yes     Relevant documents present and verified: yes     Patient identity confirmed:  Verbally with patient and arm band Anesthesia:    Anesthesia method:  None Procedure details:    Treatment site:  L anterior   Treatment method:  Nasal tampon   Treatment complexity:  Limited   Treatment episode: initial   Post-procedure details:    Assessment:  Bleeding stopped   Procedure completion:  Tolerated   Medications Ordered in ED Medications  morphine 4 MG/ML injection 4 mg (has no administration in time range)  oxymetazoline (AFRIN) 0.05 % nasal spray 1 spray (1 spray Each Nare Given 03/28/21 0942)  iohexol (OMNIPAQUE) 350 MG/ML injection  100 mL (100 mLs Intravenous Contrast Given 03/28/21 1201)  tetracaine (PONTOCAINE) 0.5 % ophthalmic solution 2 drop (2 drops Right Eye Given by Other 03/28/21 1246)    ED Course  I have reviewed the triage vital signs and the nursing notes.  Pertinent labs & imaging results that were available during my care of the patient were reviewed by me and considered in my medical decision making (see chart for details).    MDM Rules/Calculators/A&P                           BP 137/72   Pulse 72   Temp 98.2 F (36.8 C)   Resp 20   Ht  (1.753 m)   Wt 86.2 kg   SpO2 97%   BMI 28.06 kg/m   Final Clinical Impression(s) / ED Diagnoses Final diagnoses:  Closed fracture of orbit, initial encounter (HCC)  Hematoma of neck, initial encounter  Abnormal CT of the abdomen  Epistaxis due to trauma  Entrapment of extraocular muscle    Rx / DC Orders ED Discharge Orders     None      9:04 AM Patient brought here as a level 2 trauma due to falling while on blood thinner medication.  At this time he is alert and oriented x4 able to answer questions appropriately.  Felt that he was getting up to use the bathroom  and may have fallen to the ground with possible loss of consciousness and was having difficult getting up from that position.  At this time he is able to move all 4 extremities with equal strength, answer questions appropriately.  Exam revealed some dried blood in his left nares and blood into the left side of face without any laceration noted.  He does have ecchymosis involving his left orbital region likely from the impact of the fall.  No chest pain no abdominal pain and no other significant signs of injury.  He is able to move appropriately.  Will obtain head lateral facial and cervical spine CT we will check basic labs.  Care discussed with Dr. Lockie Mola.   9:52 AM Left nares nosebleed persists, was able to insert a nasal tampon and to provide hemostasis after Afrin spray was used.   Patient tolerates procedure.  11:12 AM CT of head, and maxillofacial demonstrate a left orbital floor fracture with a large fracture fragment displaced upwards just 1 cm into the left maxillary sinus.  The inferior left ocular musculature extending.  Through the right fracture site.  This is concerning for potential muscle/nerve entrapment of the left optic nerve.  On reexamination patient does have some decreased left upward gaze.  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  Plan to consult ophthalmology for recommendation.  Additional imaging ordered which include neck CTA, along with chest abdomen pelvis CT with contrast.  I have probably consulted our on-call ophthalmologist, Dr. Allena Katz in regards to potential muscle entrapment of the left eye.  Dr. Allena Katz states he does not specialize in ocular plasty and request consulting either week for [facility] ophthalmology for further management.  12:07 PM Appreciate consultation from on-call ophthalmology resident from [facility] who agrees to accept patient ER to ER for further evaluation of his injury.  Plan is if patient needs surgical intervention, the ENT department can be involved he also request for [place] to check patient's intraocular pressure and if it is greater than 20-25 then he will need to be re-consulted  12:32 PM Fortunately intraocular pressure of the left eye is 18.  I have low suspicion for acute angle glaucoma.  Patient states that he does receive ophthalmology care through [facility] therefore it is appropriate to send him there.  1:12 PM I have spoken with the EDP at [facility] through the PALS line.  I spoke with Dr. Cira Rue who agrees to accept pt. patient made aware of findings on CT scan as well as abnormal incidental finding.  Will need outpatient follow-up for further care.   Fayrene Helper, PA-C 03/28/21 1351    Curatolo, Adam, DO 03/28/21 1452

## 2021-05-11 ENCOUNTER — Other Ambulatory Visit: Payer: Self-pay

## 2021-05-11 ENCOUNTER — Encounter (HOSPITAL_BASED_OUTPATIENT_CLINIC_OR_DEPARTMENT_OTHER): Payer: Self-pay | Admitting: Obstetrics and Gynecology

## 2021-05-11 ENCOUNTER — Emergency Department (HOSPITAL_BASED_OUTPATIENT_CLINIC_OR_DEPARTMENT_OTHER)
Admission: EM | Admit: 2021-05-11 | Discharge: 2021-05-11 | Disposition: A | Discharge status: Home / Self Care | Payer: Medicare Other | Attending: Emergency Medicine | Admitting: Emergency Medicine

## 2021-05-11 ENCOUNTER — Emergency Department (HOSPITAL_BASED_OUTPATIENT_CLINIC_OR_DEPARTMENT_OTHER): Payer: Medicare Other | Admitting: Radiology

## 2021-05-11 DIAGNOSIS — R079 Chest pain, unspecified: Secondary | ICD-10-CM | POA: Insufficient documentation

## 2021-05-11 DIAGNOSIS — Z5321 Procedure and treatment not carried out due to patient leaving prior to being seen by health care provider: Secondary | ICD-10-CM | POA: Insufficient documentation

## 2021-05-11 DIAGNOSIS — R2243 Localized swelling, mass and lump, lower limb, bilateral: Secondary | ICD-10-CM | POA: Insufficient documentation

## 2021-05-11 DIAGNOSIS — R600 Localized edema: Secondary | ICD-10-CM | POA: Insufficient documentation

## 2021-05-11 DIAGNOSIS — R0602 Shortness of breath: Secondary | ICD-10-CM | POA: Insufficient documentation

## 2021-05-11 LAB — CBC
HCT: 34.1 % — ABNORMAL LOW (ref 39.0–52.0)
Hemoglobin: 11.6 g/dL — ABNORMAL LOW (ref 13.0–17.0)
MCH: 33.7 pg (ref 26.0–34.0)
MCHC: 34 g/dL (ref 30.0–36.0)
MCV: 99.1 fL (ref 80.0–100.0)
Platelets: 121 10*3/uL — ABNORMAL LOW (ref 150–400)
RBC: 3.44 MIL/uL — ABNORMAL LOW (ref 4.22–5.81)
RDW: 12.5 % (ref 11.5–15.5)
WBC: 5.4 10*3/uL (ref 4.0–10.5)
nRBC: 0 % (ref 0.0–0.2)

## 2021-05-11 LAB — PROTIME-INR
INR: 1.1 (ref 0.8–1.2)
Prothrombin Time: 14.1 seconds (ref 11.4–15.2)

## 2021-05-11 LAB — BASIC METABOLIC PANEL
Anion gap: 15 (ref 5–15)
BUN: 10 mg/dL (ref 8–23)
CO2: 24 mmol/L (ref 22–32)
Calcium: 8.4 mg/dL — ABNORMAL LOW (ref 8.9–10.3)
Chloride: 105 mmol/L (ref 98–111)
Creatinine, Ser: 0.63 mg/dL (ref 0.61–1.24)
GFR, Estimated: >60 mL/min (ref >60)
Glucose, Bld: 79 mg/dL (ref 70–99)
Potassium: 3.3 mmol/L — ABNORMAL LOW (ref 3.5–5.1)
Sodium: 144 mmol/L (ref 135–145)

## 2021-05-11 LAB — BRAIN NATRIURETIC PEPTIDE: B Natriuretic Peptide: 89.7 pg/mL (ref 0.0–100.0)

## 2021-05-11 NOTE — ED Notes (Signed)
Patient called to update vitals with no answer.  Checked all areas of the lobby and restroom.

## 2021-05-11 NOTE — ED Triage Notes (Signed)
Patient reports bilateral leg swelling. Patient denies hx of CHF. Patient reports he wroked out today and was walking and thinks he overdid it.

## 2021-05-19 ENCOUNTER — Other Ambulatory Visit: Payer: Self-pay

## 2021-05-19 ENCOUNTER — Emergency Department (HOSPITAL_COMMUNITY): Payer: Medicare Other

## 2021-05-19 ENCOUNTER — Emergency Department (HOSPITAL_BASED_OUTPATIENT_CLINIC_OR_DEPARTMENT_OTHER): Payer: Medicare Other

## 2021-05-19 ENCOUNTER — Encounter (HOSPITAL_COMMUNITY): Payer: Self-pay

## 2021-05-19 ENCOUNTER — Inpatient Hospital Stay (HOSPITAL_COMMUNITY)
Admission: EM | Admit: 2021-05-19 | Discharge: 2021-05-22 | DRG: 683 | Disposition: A | Discharge status: Home Health | Payer: Medicare Other | Attending: Internal Medicine | Admitting: Internal Medicine

## 2021-05-19 DIAGNOSIS — Z7901 Long term (current) use of anticoagulants: Secondary | ICD-10-CM | POA: Diagnosis not present

## 2021-05-19 DIAGNOSIS — D7589 Other specified diseases of blood and blood-forming organs: Secondary | ICD-10-CM | POA: Diagnosis present

## 2021-05-19 DIAGNOSIS — Z87891 Personal history of nicotine dependence: Secondary | ICD-10-CM

## 2021-05-19 DIAGNOSIS — R7989 Other specified abnormal findings of blood chemistry: Secondary | ICD-10-CM | POA: Diagnosis not present

## 2021-05-19 DIAGNOSIS — E46 Unspecified protein-calorie malnutrition: Secondary | ICD-10-CM | POA: Insufficient documentation

## 2021-05-19 DIAGNOSIS — E440 Moderate protein-calorie malnutrition: Secondary | ICD-10-CM | POA: Insufficient documentation

## 2021-05-19 DIAGNOSIS — S0232XA Fracture of orbital floor, left side, initial encounter for closed fracture: Secondary | ICD-10-CM

## 2021-05-19 DIAGNOSIS — I480 Paroxysmal atrial fibrillation: Secondary | ICD-10-CM | POA: Diagnosis present

## 2021-05-19 DIAGNOSIS — R609 Edema, unspecified: Secondary | ICD-10-CM

## 2021-05-19 DIAGNOSIS — Z9114 Patient's other noncompliance with medication regimen: Secondary | ICD-10-CM | POA: Insufficient documentation

## 2021-05-19 DIAGNOSIS — F4310 Post-traumatic stress disorder, unspecified: Secondary | ICD-10-CM | POA: Diagnosis present

## 2021-05-19 DIAGNOSIS — Z6828 Body mass index (BMI) 28.0-28.9, adult: Secondary | ICD-10-CM

## 2021-05-19 DIAGNOSIS — R531 Weakness: Secondary | ICD-10-CM

## 2021-05-19 DIAGNOSIS — Z9181 History of falling: Secondary | ICD-10-CM

## 2021-05-19 DIAGNOSIS — K760 Fatty (change of) liver, not elsewhere classified: Secondary | ICD-10-CM | POA: Diagnosis present

## 2021-05-19 DIAGNOSIS — W050XXA Fall from non-moving wheelchair, initial encounter: Secondary | ICD-10-CM | POA: Diagnosis present

## 2021-05-19 DIAGNOSIS — M6281 Muscle weakness (generalized): Secondary | ICD-10-CM | POA: Insufficient documentation

## 2021-05-19 DIAGNOSIS — H547 Unspecified visual loss: Secondary | ICD-10-CM | POA: Diagnosis present

## 2021-05-19 DIAGNOSIS — M6282 Rhabdomyolysis: Secondary | ICD-10-CM | POA: Diagnosis present

## 2021-05-19 DIAGNOSIS — Z79899 Other long term (current) drug therapy: Secondary | ICD-10-CM

## 2021-05-19 DIAGNOSIS — I4891 Unspecified atrial fibrillation: Secondary | ICD-10-CM | POA: Diagnosis present

## 2021-05-19 DIAGNOSIS — W19XXXA Unspecified fall, initial encounter: Secondary | ICD-10-CM

## 2021-05-19 DIAGNOSIS — R7401 Elevation of levels of liver transaminase levels: Secondary | ICD-10-CM

## 2021-05-19 DIAGNOSIS — I6522 Occlusion and stenosis of left carotid artery: Secondary | ICD-10-CM | POA: Insufficient documentation

## 2021-05-19 DIAGNOSIS — Z20822 Contact with and (suspected) exposure to covid-19: Secondary | ICD-10-CM | POA: Diagnosis present

## 2021-05-19 DIAGNOSIS — F10239 Alcohol dependence with withdrawal, unspecified: Secondary | ICD-10-CM | POA: Diagnosis not present

## 2021-05-19 DIAGNOSIS — R296 Repeated falls: Secondary | ICD-10-CM | POA: Diagnosis present

## 2021-05-19 DIAGNOSIS — N179 Acute kidney failure, unspecified: Secondary | ICD-10-CM | POA: Diagnosis not present

## 2021-05-19 DIAGNOSIS — I10 Essential (primary) hypertension: Secondary | ICD-10-CM | POA: Diagnosis present

## 2021-05-19 DIAGNOSIS — M7989 Other specified soft tissue disorders: Secondary | ICD-10-CM | POA: Diagnosis present

## 2021-05-19 DIAGNOSIS — R269 Unspecified abnormalities of gait and mobility: Secondary | ICD-10-CM | POA: Insufficient documentation

## 2021-05-19 DIAGNOSIS — E860 Dehydration: Secondary | ICD-10-CM | POA: Diagnosis present

## 2021-05-19 DIAGNOSIS — Z993 Dependence on wheelchair: Secondary | ICD-10-CM

## 2021-05-19 DIAGNOSIS — N400 Enlarged prostate without lower urinary tract symptoms: Secondary | ICD-10-CM | POA: Diagnosis present

## 2021-05-19 LAB — CBC WITH DIFFERENTIAL/PLATELET
Abs Immature Granulocytes: 0.06 10*3/uL (ref 0.00–0.07)
Basophils Absolute: 0 10*3/uL (ref 0.0–0.1)
Basophils Relative: 0 %
Eosinophils Absolute: 0 10*3/uL (ref 0.0–0.5)
Eosinophils Relative: 0 %
HCT: 36.7 % — ABNORMAL LOW (ref 39.0–52.0)
Hemoglobin: 12.1 g/dL — ABNORMAL LOW (ref 13.0–17.0)
Immature Granulocytes: 1 %
Lymphocytes Relative: 7 %
Lymphs Abs: 0.7 10*3/uL (ref 0.7–4.0)
MCH: 34.6 pg — ABNORMAL HIGH (ref 26.0–34.0)
MCHC: 33 g/dL (ref 30.0–36.0)
MCV: 104.9 fL — ABNORMAL HIGH (ref 80.0–100.0)
Monocytes Absolute: 0.5 10*3/uL (ref 0.1–1.0)
Monocytes Relative: 6 %
Neutro Abs: 7.8 10*3/uL — ABNORMAL HIGH (ref 1.7–7.7)
Neutrophils Relative %: 86 %
Platelets: 106 10*3/uL — ABNORMAL LOW (ref 150–400)
RBC: 3.5 MIL/uL — ABNORMAL LOW (ref 4.22–5.81)
RDW: 12.7 % (ref 11.5–15.5)
WBC: 9.1 10*3/uL (ref 4.0–10.5)
nRBC: 0 % (ref 0.0–0.2)

## 2021-05-19 LAB — URINALYSIS, ROUTINE W REFLEX MICROSCOPIC
Bacteria, UA: NONE SEEN
Glucose, UA: NEGATIVE mg/dL
Hgb urine dipstick: NEGATIVE
Ketones, ur: 20 mg/dL — AB
Leukocytes,Ua: NEGATIVE
Nitrite: NEGATIVE
Protein, ur: 100 mg/dL — AB
Specific Gravity, Urine: 1.03 (ref 1.005–1.030)
pH: 5 (ref 5.0–8.0)

## 2021-05-19 LAB — PROTIME-INR
INR: 1.4 — ABNORMAL HIGH (ref 0.8–1.2)
Prothrombin Time: 17.3 seconds — ABNORMAL HIGH (ref 11.4–15.2)

## 2021-05-19 LAB — COMPREHENSIVE METABOLIC PANEL
ALT: 85 U/L — ABNORMAL HIGH (ref 0–44)
AST: 247 U/L — ABNORMAL HIGH (ref 15–41)
Albumin: 3.1 g/dL — ABNORMAL LOW (ref 3.5–5.0)
Alkaline Phosphatase: 68 U/L (ref 38–126)
Anion gap: 12 (ref 5–15)
BUN: 16 mg/dL (ref 8–23)
CO2: 24 mmol/L (ref 22–32)
Calcium: 8.2 mg/dL — ABNORMAL LOW (ref 8.9–10.3)
Chloride: 103 mmol/L (ref 98–111)
Creatinine, Ser: 1.33 mg/dL — ABNORMAL HIGH (ref 0.61–1.24)
GFR, Estimated: 57 mL/min — ABNORMAL LOW (ref >60)
Glucose, Bld: 103 mg/dL — ABNORMAL HIGH (ref 70–99)
Potassium: 3.5 mmol/L (ref 3.5–5.1)
Sodium: 139 mmol/L (ref 135–145)
Total Bilirubin: 3.3 mg/dL — ABNORMAL HIGH (ref 0.3–1.2)
Total Protein: 5.9 g/dL — ABNORMAL LOW (ref 6.5–8.1)

## 2021-05-19 LAB — RESP PANEL BY RT-PCR (FLU A&B, COVID) ARPGX2
Influenza A by PCR: NEGATIVE
Influenza B by PCR: NEGATIVE
SARS Coronavirus 2 by RT PCR: NEGATIVE

## 2021-05-19 LAB — ACETAMINOPHEN LEVEL: Acetaminophen (Tylenol), Serum: <10 ug/mL — ABNORMAL LOW (ref 10–30)

## 2021-05-19 LAB — AMMONIA: Ammonia: 20 umol/L (ref 9–35)

## 2021-05-19 LAB — ETHANOL: Alcohol, Ethyl (B): <10 mg/dL (ref <10)

## 2021-05-19 MED ORDER — ALLOPURINOL 300 MG PO TABS
300.0000 mg | ORAL_TABLET | Freq: Every day | ORAL | Status: DC
  Administered 2021-05-19 – 2021-05-21 (×3): 300 mg via ORAL
  Filled 2021-05-19 (×3): qty 1

## 2021-05-19 MED ORDER — IOHEXOL 350 MG/ML SOLN
80.0000 mL | Freq: Once | INTRAVENOUS | Status: AC | PRN
  Administered 2021-05-19: 80 mL via INTRAVENOUS

## 2021-05-19 MED ORDER — LORAZEPAM 2 MG/ML IJ SOLN
1.0000 mg | INTRAMUSCULAR | Status: DC | PRN
  Administered 2021-05-20 (×2): 2 mg via INTRAVENOUS
  Administered 2021-05-20: 1 mg via INTRAVENOUS
  Filled 2021-05-19 (×3): qty 1

## 2021-05-19 MED ORDER — ADULT MULTIVITAMIN W/MINERALS CH
1.0000 | ORAL_TABLET | Freq: Every day | ORAL | Status: DC
  Administered 2021-05-20 – 2021-05-22 (×3): 1 via ORAL
  Filled 2021-05-19 (×3): qty 1

## 2021-05-19 MED ORDER — METOPROLOL TARTRATE 25 MG PO TABS
25.0000 mg | ORAL_TABLET | Freq: Two times a day (BID) | ORAL | Status: DC
  Administered 2021-05-19 – 2021-05-22 (×4): 25 mg via ORAL
  Filled 2021-05-19 (×6): qty 1

## 2021-05-19 MED ORDER — AMLODIPINE BESYLATE 5 MG PO TABS
5.0000 mg | ORAL_TABLET | Freq: Every day | ORAL | Status: DC
  Administered 2021-05-20 – 2021-05-22 (×2): 5 mg via ORAL
  Filled 2021-05-19 (×3): qty 1

## 2021-05-19 MED ORDER — OXYBUTYNIN CHLORIDE 5 MG PO TABS
5.0000 mg | ORAL_TABLET | Freq: Every day | ORAL | Status: DC
  Administered 2021-05-19 – 2021-05-21 (×3): 5 mg via ORAL
  Filled 2021-05-19 (×4): qty 1

## 2021-05-19 MED ORDER — POTASSIUM CHLORIDE CRYS ER 20 MEQ PO TBCR
40.0000 meq | EXTENDED_RELEASE_TABLET | Freq: Once | ORAL | Status: AC
  Administered 2021-05-19: 40 meq via ORAL
  Filled 2021-05-19: qty 2

## 2021-05-19 MED ORDER — SODIUM CHLORIDE 0.9 % IV BOLUS
500.0000 mL | Freq: Once | INTRAVENOUS | Status: AC
  Administered 2021-05-19: 500 mL via INTRAVENOUS

## 2021-05-19 MED ORDER — THIAMINE HCL 100 MG PO TABS
100.0000 mg | ORAL_TABLET | Freq: Every day | ORAL | Status: DC
  Administered 2021-05-21 – 2021-05-22 (×2): 100 mg via ORAL
  Filled 2021-05-19 (×2): qty 1

## 2021-05-19 MED ORDER — LACTATED RINGERS IV SOLN: INTRAVENOUS | Status: AC

## 2021-05-19 MED ORDER — ATORVASTATIN CALCIUM 40 MG PO TABS
40.0000 mg | ORAL_TABLET | Freq: Every day | ORAL | Status: DC
  Administered 2021-05-19 – 2021-05-21 (×3): 40 mg via ORAL
  Filled 2021-05-19 (×3): qty 1

## 2021-05-19 MED ORDER — FOLIC ACID 1 MG PO TABS
1.0000 mg | ORAL_TABLET | Freq: Every day | ORAL | Status: DC
  Administered 2021-05-20 – 2021-05-22 (×3): 1 mg via ORAL
  Filled 2021-05-19 (×3): qty 1

## 2021-05-19 MED ORDER — LORAZEPAM 1 MG PO TABS
1.0000 mg | ORAL_TABLET | ORAL | Status: DC | PRN
  Administered 2021-05-19: 3 mg via ORAL
  Filled 2021-05-19: qty 3

## 2021-05-19 MED ORDER — SODIUM CHLORIDE 0.9 % IV SOLN: Freq: Once | INTRAVENOUS | Status: AC

## 2021-05-19 MED ORDER — ONDANSETRON HCL 4 MG/2ML IJ SOLN
4.0000 mg | Freq: Four times a day (QID) | INTRAMUSCULAR | Status: DC | PRN
  Administered 2021-05-20: 4 mg via INTRAVENOUS
  Filled 2021-05-19: qty 2

## 2021-05-19 MED ORDER — TAMSULOSIN HCL 0.4 MG PO CAPS
0.4000 mg | ORAL_CAPSULE | Freq: Every day | ORAL | Status: DC
  Administered 2021-05-19 – 2021-05-21 (×3): 0.4 mg via ORAL
  Filled 2021-05-19 (×4): qty 1

## 2021-05-19 MED ORDER — ONDANSETRON HCL 4 MG PO TABS: 4.0000 mg | ORAL_TABLET | Freq: Four times a day (QID) | ORAL | Status: DC | PRN

## 2021-05-19 MED ORDER — THIAMINE HCL 100 MG/ML IJ SOLN
100.0000 mg | Freq: Every day | INTRAMUSCULAR | Status: DC
  Administered 2021-05-20: 100 mg via INTRAVENOUS
  Filled 2021-05-19: qty 2

## 2021-05-19 NOTE — ED Provider Notes (Signed)
[facility] EMERGENCY DEPARTMENT Provider Note   CSN: [id number] Arrival date & time: 05/19/21  1115     History Chief Complaint  Patient presents with   Weakness    Taylor Myers is a 70 y.o. male.  HPI He presents for evaluation of difficulty with transferring from wheelchair to bed, and apparently has slumped to the floor couple of different times.  He lives with his elderly father, and has to be in a wheelchair because of chronic lower extremity weakness.  He has a complicated past history and states that he is due to have some surgery for a left suborbital fracture, this week.  He is a somewhat vague historian.  He states he has some services at home including an aide, but that he had his father, who is 75, "help each other."  Patient denies fever, chills, vomiting or dizziness.  He states he is taking his usual medications.  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   Past Surgical History:  Procedure Laterality Date   ANKLE SURGERY     HIP SURGERY     KNEE SURGERY         History reviewed. No pertinent family history.  Social History   Tobacco Use   Smoking status: Former    Types: Cigarettes   Smokeless tobacco: Never  Vaping Use   Vaping Use: Never used  Substance Use Topics   Alcohol use: Yes    Alcohol/week: 2.0 standard drinks    Types: 2 Standard drinks or equivalent per week   Drug use: Not Currently    Home Medications Prior to Admission medications   Not on File    Allergies    Patient has no known allergies.  Review of Systems   Review of Systems  All other systems reviewed and are negative.  Physical Exam Updated Vital Signs BP 127/78   Pulse 78   Temp 99.2 F (37.3 C) (Oral)    Resp 18   Ht 5\' 9"  (1.753 m)   Wt 84.4 kg   SpO2 96%   BMI 27.47 kg/m   Physical Exam Vitals and nursing note reviewed.  Constitutional:      Appearance: He is well-developed. He is not ill-appearing.     Comments: Disheveled, appears older than stated age  HENT:     Head: Normocephalic and atraumatic.     Right Ear: External ear normal.     Left Ear: External ear normal.  Eyes:     Comments: Proptotic and erythematous right eye.  External ocular muscles appear intact.  Neck:     Trachea: Phonation normal.  Cardiovascular:     Rate and Rhythm: Normal rate and regular rhythm.     Heart sounds: Normal heart sounds.  Pulmonary:     Effort: Pulmonary effort is normal.     Breath sounds: Normal breath sounds.  Abdominal:     Palpations: Abdomen is soft.     Tenderness: There is no abdominal tenderness.  Musculoskeletal:     Cervical back: Normal range of motion and neck supple.     Comments: Moderate right lower leg swelling.  Chronic appearing foot deformity of the right ankle.  No swelling left leg.  He is able to lift both legs, independently off the stretcher.  No problems with moving arms, bilaterally.  No visual arm injuries.  Skin:    General: Skin is warm and dry.  Neurological:     Mental Status: He is alert and oriented to person, place, and time.     Cranial Nerves: No cranial nerve deficit.     Sensory: No sensory deficit.     Motor: No abnormal muscle tone.     Coordination: Coordination normal.  Psychiatric:        Mood and Affect: Mood normal.        Behavior: Behavior normal.    ED Results / Procedures / Treatments   Labs (all labs ordered are listed, but only abnormal results are displayed) Labs Reviewed  COMPREHENSIVE METABOLIC PANEL - Abnormal; Notable for the following components:      Result Value   Glucose, Bld 103 (*)    Creatinine, Ser 1.33 (*)    Calcium 8.2 (*)    Total Protein 5.9 (*)    Albumin 3.1 (*)    AST 247 (*)    ALT 85 (*)     Total Bilirubin 3.3 (*)    GFR, Estimated 57 (*)    All other components within normal limits  CBC WITH DIFFERENTIAL/PLATELET - Abnormal; Notable for the following components:   RBC 3.50 (*)    Hemoglobin 12.1 (*)    HCT 36.7 (*)    MCV 104.9 (*)    MCH 34.6 (*)    Platelets 106 (*)    Neutro Abs 7.8 (*)    All other components within normal limits  URINALYSIS, ROUTINE W REFLEX MICROSCOPIC - Abnormal; Notable for the following components:   Color, Urine AMBER (*)    APPearance HAZY (*)    Bilirubin Urine SMALL (*)    Ketones, ur 20 (*)    Protein, ur 100 (*)    All other components within normal limits  RESP PANEL BY RT-PCR (FLU A&B, COVID) [id number]    EKG None  Radiology DG Chest Port 1 View  Result Date: 05/19/2021 CLINICAL DATA:  Larey Seat this morning, weakness today, history hypertension EXAM: PORTABLE CHEST 1 VIEW COMPARISON:  Portable exam 1206 hours compared to 05/11/2021 FINDINGS: Rotated to the RIGHT. Enlargement of cardiac silhouette. Mediastinal contours and pulmonary vascularity normal. Atherosclerotic calcification aorta. Lungs clear. No acute infiltrate, pleural effusion, or pneumothorax. Advanced LEFT glenohumeral degenerative changes and old posttraumatic deformity of proximal LEFT humerus. Osseous demineralization. IMPRESSION: Enlargement of cardiac silhouette. No acute abnormalities. Electronically Signed   By: Ulyses Southward M.D.   On: 05/19/2021 12:50   VAS [place] LOWER EXTREMITY VENOUS (DVT) (ONLY [facility] & [facility] 7a-7p)  Result Date: 05/19/2021  Lower Venous DVT Study Patient Name:  Taylor Myers  Date of Exam:   05/19/2021 Medical Rec #: [id number]    Accession #:    [id number] Date of Birth: 1951/08/13   Patient Gender: M Patient Age:   38 years Exam Location:  [facility] Procedure:      VAS [place] LOWER EXTREMITY VENOUS (DVT) Referring Phys: Mancel Bale --------------------------------------------------------------------------------  Indications: Edema.  Limitations: Body  habitus, poor ultrasound/tissue interface and restricted mobility, patient position. Comparison Study: 07/02/2020 negative bilateral lower extremity venous duplex Performing Technologist: Gertie Fey MHA, RDMS, RVT, RDCS  Examination Guidelines: A complete evaluation includes B-mode imaging, spectral Doppler, color Doppler, and power Doppler as needed of all accessible portions of each vessel. Bilateral testing is considered an integral  part of a complete examination. Limited examinations for reoccurring indications may be performed as noted. The reflux portion of the exam is performed with the patient in reverse Trendelenburg.  +---------+---------------+---------+-----------+----------+--------------+ RIGHT    CompressibilityPhasicitySpontaneityPropertiesThrombus Aging +---------+---------------+---------+-----------+----------+--------------+ CFV      Full           Yes      Yes                                 +---------+---------------+---------+-----------+----------+--------------+ SFJ      Full                                                        +---------+---------------+---------+-----------+----------+--------------+ FV Prox  Full                                                        +---------+---------------+---------+-----------+----------+--------------+ FV Mid   Full                                                        +---------+---------------+---------+-----------+----------+--------------+ FV DistalFull                                                        +---------+---------------+---------+-----------+----------+--------------+ PFV      Full                                                        +---------+---------------+---------+-----------+----------+--------------+ POP      Full           Yes      Yes                                 +---------+---------------+---------+-----------+----------+--------------+ PTV       Full                                                        +---------+---------------+---------+-----------+----------+--------------+ PERO     Full                                                        +---------+---------------+---------+-----------+----------+--------------+   +---------+---------------+---------+-----------+----------+--------------+ LEFT     CompressibilityPhasicitySpontaneityPropertiesThrombus Aging +---------+---------------+---------+-----------+----------+--------------+ CFV  Full           Yes      Yes                                 +---------+---------------+---------+-----------+----------+--------------+ SFJ      Full                                                        +---------+---------------+---------+-----------+----------+--------------+ FV Prox  Full                                                        +---------+---------------+---------+-----------+----------+--------------+ FV Mid   Full                                                        +---------+---------------+---------+-----------+----------+--------------+ FV DistalFull                                                        +---------+---------------+---------+-----------+----------+--------------+ PFV      Full                                                        +---------+---------------+---------+-----------+----------+--------------+ POP      Full           Yes      Yes                                 +---------+---------------+---------+-----------+----------+--------------+ PTV      Full                                                        +---------+---------------+---------+-----------+----------+--------------+ PERO     Full                                                        +---------+---------------+---------+-----------+----------+--------------+     Summary: RIGHT: - There is no evidence of deep vein  thrombosis in the lower extremity.  - No cystic structure found in the popliteal fossa.  LEFT: - There is no evidence of deep vein thrombosis in the lower extremity.  - No cystic structure found in the popliteal fossa.  *See table(s) above for measurements and  observations. Electronically signed by Waverly Ferrari MD on 05/19/2021 at 3:44:07 PM.    Final     Procedures Procedures   Medications Ordered in ED Medications  sodium chloride 0.9 % bolus 500 mL (0 mLs Intravenous Stopped 05/19/21 1400)    ED Course  I have reviewed the triage vital signs and the nursing notes.  Pertinent labs & imaging results that were available during my care of the patient were reviewed by me and considered in my medical decision making (see chart for details).  Clinical Course as of 05/19/21 1829  Tue May 19, 2021  1555 Per nursing, the patient had some confusion and had to be redirected back to his bed. [EW]  1611 He has been seen by [facility] who help to arrange some home health services.  I wrote the orders for home health nursing, PT, OT, home health aide, and social work.  This will be useful in case he can be discharged. [EW]    Clinical Course User Index [EW] Mancel Bale, MD   MDM Rules/Calculators/A&P                            Patient Vitals for the past 24 hrs:  BP Temp Temp src Pulse Resp SpO2 Height Weight  05/19/21 1438 127/78 -- -- 78 18 96 % -- --  05/19/21 1300 122/74 -- -- 88 19 99 % -- --  05/19/21 1145 (!) 128/116 -- -- 92 18 99 % -- --  05/19/21 1125 -- -- -- -- -- -- 5\' 9"  (1.753 m) 84.4 kg  05/19/21 1122 116/85 99.2 F (37.3 C) Oral 87 18 100 % -- --      Medical Decision Making:  This patient is presenting for evaluation of multiple falls and weakness, which does require a range of treatment options, and is a complaint that involves a moderate risk of morbidity and mortality. The differential diagnoses include acute illness, chronic weakness, acute injury. I decided to  review old records, and in summary elderly male, living at home, debilitated, chronically, using assistive devices, presenting with weakness and following.  I did not require additional historical information from anyone.  Clinical Laboratory Tests Ordered, included CBC, Metabolic panel, Urinalysis, and viral panel . Review indicates normal except urine with small amount of bili, ketones and protein; hemoglobin low, MCV high, glucose high, creatinine high, calcium low, total protein low, albumin low, AST high, ALT high, total bilirubin high, GFR low. Radiologic Tests Ordered, included chest x-ray, CT abdomen pelvis.  I independently Visualized: Radiographic images, which show chest x-ray no acute abnormalities, CT pending at time of transfer of care.     Critical Interventions-clinical evaluation, laboratory testing, observation and reassessment.  TOC consultation for social services at home if he can be discharged.  After These Interventions, the Patient was reevaluated and was found with nonspecific disabilities.  He has chronic baseline difficulty ambulating due to his orthopedic injuries.  He is apparently had other falls in his injuries, requiring facial fracture repair.  ED evaluation today consistent with new transaminitis and hyperbilirubinemia.  CT abdomen pelvis ordered to evaluate that.  Vital signs stable.  Patient with transient status changes of nonspecific nature.  He appears to be at high risk for discharge home, and with very least would need home health services that he can go there.  CRITICAL CARE- No Performed by: 05/21/21  Nursing Notes Reviewed/ Care Coordinated Applicable Imaging Reviewed Interpretation of Laboratory  Data incorporated into ED treatment  Disposition by DrLockie Molaatolo, following return of imaging and ongoing treatment.    Final Clinical Impression(s) / ED Diagnoses Final diagnoses:  Weakness  Fall, initial encounter  Transaminitis   Hyperbilirubinemia  Right leg swelling    Rx / DC Orders ED Discharge Orders     None        Mancel Bale, MD 05/19/21 1831

## 2021-05-19 NOTE — Assessment & Plan Note (Signed)
Pt sustained orbital floor fracture last month(03-2021) after falling out of wheelchair. Pt seen at [facility]. Has surgery scheduled for 05-22-2021 at [facility]. Holding Eliquis per pt request.

## 2021-05-19 NOTE — Assessment & Plan Note (Signed)
Patient is wheelchair-bound.  He had a fall in August 2022 resulting in a blowout fracture of his orbit.  Reportedly patient lives at home and takes care of his 72 year old father.  Will need social worker involved to see if they need any other home services.

## 2021-05-19 NOTE — Progress Notes (Signed)
Bilateral lower extremity venous duplex completed. Refer to "CV Proc" under chart review to view preliminary results.  05/19/2021 2:18 PM Eula Fried., MHA, RVT, RDCS, RDMS

## 2021-05-19 NOTE — ED Provider Notes (Signed)
Patient handed off to me awaiting CT scan of abdomen and pelvis.  Elevated liver enzymes and bilirubin.  No particular abdominal pain.  Overall generalized weakness.  He has had some right lower leg swelling but DVT studies negative.  Had a recent fall and is due for facial surgery later this week.  Creatinine is also elevated.  Suspect may be some dehydration.  Does admit heavy alcohol use in the past but not much anymore.  Denies any recent Tylenol use.  CT scan of abdomen and pelvis overall unremarkable.  Some fatty liver changes.  Right upper quadrant ultrasound has been ordered.  INR is 1.4.  Tylenol level unremarkable.  Overall suspect may be dehydration causing lab abnormalities.  Will admit for observation stay for hydration.  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  This chart was dictated using voice recognition software.  Despite best efforts to proofread,  errors can occur which can change the documentation meaning.    Taylor Norfolk, DO 05/19/21 2022

## 2021-05-19 NOTE — Assessment & Plan Note (Signed)
Patient reportedly on Eliquis for history of A. fib.

## 2021-05-19 NOTE — Discharge Planning (Addendum)
Taylor Myers J. Lucretia Roers, RN, BSN, [place] [phone] RNCM spoke with pt at bedside regarding discharge planning for Home Health Services. Offered pt medicare.gov list of home health agencies to choose from.  Pt chose [facility] to render services. Elnita Maxwell of [facility] notified. Patient made aware that [facility] will be in contact in 24 hours.  No DME needs identified at this time.

## 2021-05-19 NOTE — Assessment & Plan Note (Signed)
Stable

## 2021-05-19 NOTE — ED Notes (Signed)
Pt reports that he drinks 3/4 a bottle of vodka or gin everyday, pt having constant hallucinations and talking to people in room that are not there as well as visual hallucinations.

## 2021-05-19 NOTE — Assessment & Plan Note (Addendum)
Elevated LFTs noted on labs.  Patient admits to drinking 1/5 liquor bottle per week.  Place him on CIWA.

## 2021-05-19 NOTE — Assessment & Plan Note (Deleted)
Check A1c.  Add sliding Scale insulin.

## 2021-05-19 NOTE — Subjective & Objective (Addendum)
CC: fall out of wheelchair HPI: 70 year old white male with a history of atrial fibrillation on chronic anticoagulation, hypertension, wheelchair-bound status due to weakness presents to the ER today after falling out of his wheelchair.  Patient had a recent ER visit last month where he sustained a blowout orbital fracture requiring transfer to [facility] for ophthalmologic evaluation.  Patient has upcoming orbital surgery on 05/22/2021.  Today patient states he is try to get out of bed.  He states he had his wheelchair placed against the side of his bed.  He states that he normally transfers from his bed over to his wheelchair without difficulty.  He thinks that the wheel was not locked in his wheelchair.  He fell between the bed and the wheelchair.  Patient's brother is visiting from [place].  His brother Became concerned.  Brother called EMS.  Patient brought to the ER.  Patient states that he has been drinking more in the last several weeks.  He states that he is drinking 1/5 of liquor every 7 days.  This equates to about 8 drinks per day.  Initially said he only had 3-4 drinks per day.  Patient states he has gotten lazy has not been doing his physical exercises.  He is also states he has not been drinking a lot of water.  Likely due to him drinking more alcohol.  Patient states that he is dehydrated.  Laboratory evaluation ER demonstrated acute kidney injury with a serum creatinine of 1.33.  Baseline serum creatinine 0.63.  LFTs were also elevated AST of 247 ALT of 85.  Total bili was elevated at 3.3.  Right upper quadrant ultrasound was negative for gallstones.  Patient has increased echogenic texture of his liver consistent with hepatic steatosis.  Patient states that he lives with his 16 year old father (going to be 78 years old next month).  To the patient's acute kidney injury, [facility] hospitalist contacted for admission.

## 2021-05-19 NOTE — Assessment & Plan Note (Addendum)
Patient A. fib on EKG today.  Pt has upcoming eye surgery on 05-22-2021 to repair orbital fracture he sustained last month after falling out of his wheelchair. Pt states he is suppose to stop Eliquis today for his surgery on 05-22-2021. Will hold eliquis. Continue lopressor 25 mg bid.

## 2021-05-19 NOTE — ED Triage Notes (Signed)
[facility] reports pt coming from home. C/o increased weakness and sliding out of his wheelchair. Also having hallucinations of people at the end of his driveway. Pt lives with his 72 year old father.

## 2021-05-19 NOTE — Assessment & Plan Note (Signed)
Admit to observation bed. Continue with IVF. Repeat labs in AM. Check PVR. Pt is on flomax at home. Will need to rule out obstructive prostate as cause of AKI. No hydronephrosis or hydroureter noted on CT scan.  Avoid nephrotoxic agents.

## 2021-05-19 NOTE — ED Notes (Signed)
Went into to room due to hearing him talking to someone. No one in pt room, pt having full conversation with "someone" in the room. When asked who he was talking to he stated he was trying to help someone not hurt themselves. Pt was attempted to get out of bed and able to be redirected back into the bed.

## 2021-05-19 NOTE — H&P (Signed)
History and Physical    Taylor Myers ZOX:[id number] DOB: May 14, 1951 DOA: 05/19/2021  PCP: Gaspar Garbe, MD   Patient coming from: Home  I have personally briefly reviewed patient's old medical records in [facility]  CC: fall out of wheelchair HPI: 70 year old white male with a history of atrial fibrillation on chronic anticoagulation, hypertension, wheelchair-bound status due to weakness presents to the ER today after falling out of his wheelchair.  Patient had a recent ER visit last month where he sustained a blowout orbital fracture requiring transfer to [facility] for ophthalmologic evaluation.  Patient has upcoming orbital surgery on 05/22/2021.  Today patient states he is try to get out of bed.  He states he had his wheelchair placed against the side of his bed.  He states that he normally transfers from his bed over to his wheelchair without difficulty.  He thinks that the wheel was not locked in his wheelchair.  He fell between the bed and the wheelchair.  Patient's brother is visiting from [place].  His brother Became concerned.  Brother called EMS.  Patient brought to the ER.  Patient states that he has been drinking more in the last several weeks.  He states that he is drinking 1/5 of liquor every 7 days.  This equates to about 8 drinks per day.  Initially said he only had 3-4 drinks per day.  Patient states he has gotten lazy has not been doing his physical exercises.  He is also states he has not been drinking a lot of water.  Likely due to him drinking more alcohol.  Patient states that he is dehydrated.  Laboratory evaluation ER demonstrated acute kidney injury with a serum creatinine of 1.33.  Baseline serum creatinine 0.63.  LFTs were also elevated AST of 247 ALT of 85.  Total bili was elevated at 3.3.  Right upper quadrant ultrasound was negative for gallstones.  Patient has increased echogenic texture of his liver consistent with hepatic  steatosis.  Patient states that he lives with his 37 year old father (going to be 91 years old next month).  To the patient's acute kidney injury, [facility] hospitalist contacted for admission.   ED Course: labs showed AKI with Scr 1.33 and elevated LFTs. RUQ negative. Given IVF.  Review of Systems:  Review of Systems  Constitutional:  Positive for malaise/fatigue.  HENT: Negative.    Eyes:  Positive for discharge.       Chronic right eye discharge  Respiratory: Negative.    Cardiovascular: Negative.   Gastrointestinal: Negative.   Genitourinary: Negative.   Musculoskeletal: Negative.   Skin: Negative.   Neurological:  Positive for weakness.  Endo/Heme/Allergies: Negative.   Psychiatric/Behavioral: Negative.    All other systems reviewed and are negative.  Past Medical History:  Diagnosis Date   Arthritis    Hypertension     Past Surgical History:  Procedure Laterality Date   ANKLE SURGERY     HIP SURGERY     KNEE SURGERY       reports that he has quit smoking. His smoking use included cigarettes. He has never used smokeless tobacco. He reports current alcohol use of about 2.0 standard drinks per week. He reports that he does not currently use drugs.  No Known Allergies  History reviewed. No pertinent family history.  Prior to Admission medications   Not on File    Physical Exam: Vitals:   05/19/21 1300 05/19/21 1438 05/19/21 1700 05/19/21 1750  BP: 122/74 127/78 (!) 136/116  140/71  Pulse: 88 78 85 82  Resp: 19 18 18 18   Temp:      TempSrc:      SpO2: 99% 96% 97% 98%  Weight:      Height:        Physical Exam Vitals and nursing note reviewed.  Constitutional:      General: He is not in acute distress.    Appearance: He is obese. He is not ill-appearing, toxic-appearing or diaphoretic.  HENT:     Head: Normocephalic.     Nose: Nose normal.  Eyes:     General:        Right eye: Discharge present.  Cardiovascular:     Rate and Rhythm: Normal rate.  Rhythm irregular.     Pulses: Normal pulses.  Pulmonary:     Effort: Pulmonary effort is normal. No respiratory distress.     Breath sounds: No wheezing or rales.  Abdominal:     General: Abdomen is protuberant. Bowel sounds are normal. There is no distension.     Palpations: Abdomen is soft.     Tenderness: There is no abdominal tenderness. There is no guarding or rebound.  Skin:    General: Skin is warm and dry.     Capillary Refill: Capillary refill takes less than 2 seconds.  Neurological:     General: No focal deficit present.     Mental Status: He is alert and oriented to person, place, and time.     Labs on Admission: I have personally reviewed following labs and imaging studies  CBC: Recent Labs  Lab 05/19/21 1157  WBC 9.1  NEUTROABS 7.8*  HGB 12.1*  HCT 36.7*  MCV 104.9*  PLT 106*   Basic Metabolic Panel: Recent Labs  Lab 05/19/21 1157  NA 139  K 3.5  CL 103  CO2 24  GLUCOSE 103*  BUN 16  CREATININE 1.33*  CALCIUM 8.2*   GFR: Estimated Creatinine Clearance: 50.9 mL/min (A) (by C-G formula based on SCr of 1.33 mg/dL (H)). Liver Function Tests: Recent Labs  Lab 05/19/21 1157  AST 247*  ALT 85*  ALKPHOS 68  BILITOT 3.3*  PROT 5.9*  ALBUMIN 3.1*   No results for input(s): LIPASE, AMYLASE in the last 168 hours. No results for input(s): AMMONIA in the last 168 hours. Coagulation Profile: Recent Labs  Lab 05/19/21 1737  INR 1.4*   Cardiac Enzymes: No results for input(s): CKTOTAL, CKMB, CKMBINDEX, TROPONINI in the last 168 hours. BNP (last 3 results) No results for input(s): PROBNP in the last 8760 hours. HbA1C: No results for input(s): HGBA1C in the last 72 hours. CBG: No results for input(s): GLUCAP in the last 168 hours. Lipid Profile: No results for input(s): CHOL, HDL, LDLCALC, TRIG, CHOLHDL, LDLDIRECT in the last 72 hours. Thyroid Function Tests: No results for input(s): TSH, T4TOTAL, FREET4, T3FREE, THYROIDAB in the last 72  hours. Anemia Panel: No results for input(s): VITAMINB12, FOLATE, FERRITIN, TIBC, IRON, RETICCTPCT in the last 72 hours. Urine analysis:    Component Value Date/Time   COLORURINE AMBER (A) 05/19/2021 1303   APPEARANCEUR HAZY (A) 05/19/2021 1303   LABSPEC 1.030 05/19/2021 1303   PHURINE 5.0 05/19/2021 1303   GLUCOSEU NEGATIVE 05/19/2021 1303   HGBUR NEGATIVE 05/19/2021 1303   BILIRUBINUR SMALL (A) 05/19/2021 1303   KETONESUR 20 (A) 05/19/2021 1303   PROTEINUR 100 (A) 05/19/2021 1303   NITRITE NEGATIVE 05/19/2021 1303   LEUKOCYTESUR NEGATIVE 05/19/2021 1303    Radiological Exams on Admission:  I have personally reviewed images CT Abdomen Pelvis W Contrast  Result Date: 05/19/2021 CLINICAL DATA:  Abdominal abscess/infection suspected EXAM: CT ABDOMEN AND PELVIS WITH CONTRAST TECHNIQUE: Multidetector CT imaging of the abdomen and pelvis was performed using the standard protocol following bolus administration of intravenous contrast. CONTRAST:  80mL OMNIPAQUE IOHEXOL 350 MG/ML SOLN COMPARISON:  None. FINDINGS: Lower chest: Lung bases are clear. Hepatobiliary: Low-attenuation liver consistent hepatic steatosis. Gallbladder normal. Pancreas: Pancreas is normal. No ductal dilatation. No pancreatic inflammation. Spleen: Normal spleen Adrenals/urinary tract: Adrenal glands normal. Nonobstructing calculus in the mid RIGHT kidney. Bilateral renal cortical scarring. Large benign cyst of the RIGHT kidney. Ureters and bladder normal. Stomach/Bowel: Stomach, small bowel, appendix, and cecum are normal. The colon and rectosigmoid colon are normal. Vascular/Lymphatic: Abdominal aorta is normal caliber with atherosclerotic calcification. There is no retroperitoneal or periportal lymphadenopathy. No pelvic lymphadenopathy. Reproductive: Prostate unremarkable Other: No free fluid.  No abscess Musculoskeletal: Degenerative changes of the spine. No acute findings IMPRESSION: 1. No abscess or infection identified in  the abdomen pelvis. 2. Hepatic steatosis. 3. No bowel obstruction or inflammation. Electronically Signed   By: Genevive Bi M.D.   On: 05/19/2021 18:58   DG Chest Port 1 View  Result Date: 05/19/2021 CLINICAL DATA:  Larey Seat this morning, weakness today, history hypertension EXAM: PORTABLE CHEST 1 VIEW COMPARISON:  Portable exam 1206 hours compared to 05/11/2021 FINDINGS: Rotated to the RIGHT. Enlargement of cardiac silhouette. Mediastinal contours and pulmonary vascularity normal. Atherosclerotic calcification aorta. Lungs clear. No acute infiltrate, pleural effusion, or pneumothorax. Advanced LEFT glenohumeral degenerative changes and old posttraumatic deformity of proximal LEFT humerus. Osseous demineralization. IMPRESSION: Enlargement of cardiac silhouette. No acute abnormalities. Electronically Signed   By: Ulyses Southward M.D.   On: 05/19/2021 12:50   VAS [place] LOWER EXTREMITY VENOUS (DVT) (ONLY [facility] & [facility] 7a-7p)  Result Date: 05/19/2021  Lower Venous DVT Study Patient Name:  MARLOS CARMEN  Date of Exam:   05/19/2021 Medical Rec #: [id number]    Accession #:    [id number] Date of Birth: June 19, 1951   Patient Gender: M Patient Age:   46 years Exam Location:  [facility] Procedure:      VAS [place] LOWER EXTREMITY VENOUS (DVT) Referring Phys: Mancel Bale --------------------------------------------------------------------------------  Indications: Edema.  Limitations: Body habitus, poor ultrasound/tissue interface and restricted mobility, patient position. Comparison Study: 07/02/2020 negative bilateral lower extremity venous duplex Performing Technologist: Gertie Fey MHA, RDMS, RVT, RDCS  Examination Guidelines: A complete evaluation includes B-mode imaging, spectral Doppler, color Doppler, and power Doppler as needed of all accessible portions of each vessel. Bilateral testing is considered an integral part of a complete examination. Limited examinations for reoccurring indications may be performed as  noted. The reflux portion of the exam is performed with the patient in reverse Trendelenburg.  +---------+---------------+---------+-----------+----------+--------------+ RIGHT    CompressibilityPhasicitySpontaneityPropertiesThrombus Aging +---------+---------------+---------+-----------+----------+--------------+ CFV      Full           Yes      Yes                                 +---------+---------------+---------+-----------+----------+--------------+ SFJ      Full                                                        +---------+---------------+---------+-----------+----------+--------------+  FV Prox  Full                                                        +---------+---------------+---------+-----------+----------+--------------+ FV Mid   Full                                                        +---------+---------------+---------+-----------+----------+--------------+ FV DistalFull                                                        +---------+---------------+---------+-----------+----------+--------------+ PFV      Full                                                        +---------+---------------+---------+-----------+----------+--------------+ POP      Full           Yes      Yes                                 +---------+---------------+---------+-----------+----------+--------------+ PTV      Full                                                        +---------+---------------+---------+-----------+----------+--------------+ PERO     Full                                                        +---------+---------------+---------+-----------+----------+--------------+   +---------+---------------+---------+-----------+----------+--------------+ LEFT     CompressibilityPhasicitySpontaneityPropertiesThrombus Aging +---------+---------------+---------+-----------+----------+--------------+ CFV      Full            Yes      Yes                                 +---------+---------------+---------+-----------+----------+--------------+ SFJ      Full                                                        +---------+---------------+---------+-----------+----------+--------------+ FV Prox  Full                                                        +---------+---------------+---------+-----------+----------+--------------+  FV Mid   Full                                                        +---------+---------------+---------+-----------+----------+--------------+ FV DistalFull                                                        +---------+---------------+---------+-----------+----------+--------------+ PFV      Full                                                        +---------+---------------+---------+-----------+----------+--------------+ POP      Full           Yes      Yes                                 +---------+---------------+---------+-----------+----------+--------------+ PTV      Full                                                        +---------+---------------+---------+-----------+----------+--------------+ PERO     Full                                                        +---------+---------------+---------+-----------+----------+--------------+     Summary: RIGHT: - There is no evidence of deep vein thrombosis in the lower extremity.  - No cystic structure found in the popliteal fossa.  LEFT: - There is no evidence of deep vein thrombosis in the lower extremity.  - No cystic structure found in the popliteal fossa.  *See table(s) above for measurements and observations. Electronically signed by Waverly Ferrari MD on 05/19/2021 at 3:44:07 PM.    Final    US Abdomen Limited RUQ (LIVER/GB)  Result Date: 05/19/2021 CLINICAL DATA:  Elevated liver function tests EXAM: ULTRASOUND ABDOMEN LIMITED RIGHT UPPER QUADRANT COMPARISON:  CT  05/19/2021 FINDINGS: Gallbladder: No gallstones or wall thickening visualized. No sonographic Murphy sign noted by sonographer. Common bile duct: Diameter: 5 mm in proximal diameter Liver: The hepatic parenchymal echogenicity is diffusely increased and there is coarsening of the hepatic echotexture and poor acoustic through transmission in keeping with changes of moderate hepatic steatosis. No focal intrahepatic masses are seen. There is no intrahepatic biliary ductal dilation. Portal vein is patent on color Doppler imaging with normal direction of blood flow towards the liver. Other: 6.7 cm simple cyst noted within the visualized right kidney. IMPRESSION: Moderate hepatic steatosis. Electronically Signed   By: Helyn Numbers M.D.   On: 05/19/2021 20:34    EKG: I have personally reviewed EKG: shows afib    Assessment/Plan Principal  Problem:   AKI (acute kidney injury) (HCC) Active Problems:   Elevated LFTs   Closed fracture of left orbital floor (HCC)   Essential hypertension   Long term (current) use of anticoagulants   Paroxysmal atrial fibrillation (HCC)   Wheelchair bound    AKI (acute kidney injury) (HCC) Admit to observation bed. Continue with IVF. Repeat labs in AM. Check PVR. Pt is on flomax at home. Will need to rule out obstructive prostate as cause of AKI. No hydronephrosis or hydroureter noted on CT scan.  Avoid nephrotoxic agents.  Elevated LFTs Elevated LFTs noted on labs.  Patient admits to drinking 1/5 liquor bottle per week.  Place him on CIWA.  Wheelchair bound Patient is wheelchair-bound.  He had a fall in August 2022 resulting in a blowout fracture of his orbit.  Reportedly patient lives at home and takes care of his 50 year old father.  Will need social worker involved to see if they need any other home services.  Essential hypertension Stable.  Long term (current) use of anticoagulants Patient reportedly on Eliquis for history of A. fib.  Paroxysmal atrial  fibrillation (HCC) Patient A. fib on EKG today.  Pt has upcoming eye surgery on 05-22-2021 to repair orbital fracture he sustained last month after falling out of his wheelchair. Pt states he is suppose to stop Eliquis today for his surgery on 05-22-2021. Will hold eliquis. Continue lopressor 25 mg bid.  Closed fracture of left orbital floor (HCC) Pt sustained orbital floor fracture last month(03-2021) after falling out of wheelchair. Pt seen at [facility]. Has surgery scheduled for 05-22-2021 at [facility]. Holding Eliquis per pt request.  DVT prophylaxis: SCDs Code Status: Full Code Family Communication: no family at bedside  Disposition Plan: pt desires to return home  Consults called: none  Admission status: Observation, Med-Surg   Carollee Herter, DO [facility] 05/19/2021, 9:01 PM

## 2021-05-20 DIAGNOSIS — F4310 Post-traumatic stress disorder, unspecified: Secondary | ICD-10-CM | POA: Diagnosis present

## 2021-05-20 DIAGNOSIS — F1020 Alcohol dependence, uncomplicated: Secondary | ICD-10-CM | POA: Diagnosis not present

## 2021-05-20 DIAGNOSIS — E440 Moderate protein-calorie malnutrition: Secondary | ICD-10-CM | POA: Diagnosis not present

## 2021-05-20 DIAGNOSIS — W050XXA Fall from non-moving wheelchair, initial encounter: Secondary | ICD-10-CM | POA: Diagnosis present

## 2021-05-20 DIAGNOSIS — Z9181 History of falling: Secondary | ICD-10-CM | POA: Diagnosis not present

## 2021-05-20 DIAGNOSIS — D7589 Other specified diseases of blood and blood-forming organs: Secondary | ICD-10-CM | POA: Diagnosis not present

## 2021-05-20 DIAGNOSIS — M7989 Other specified soft tissue disorders: Secondary | ICD-10-CM | POA: Diagnosis not present

## 2021-05-20 DIAGNOSIS — N179 Acute kidney failure, unspecified: Secondary | ICD-10-CM | POA: Diagnosis not present

## 2021-05-20 DIAGNOSIS — N400 Enlarged prostate without lower urinary tract symptoms: Secondary | ICD-10-CM | POA: Diagnosis present

## 2021-05-20 DIAGNOSIS — Z87891 Personal history of nicotine dependence: Secondary | ICD-10-CM | POA: Diagnosis not present

## 2021-05-20 DIAGNOSIS — H547 Unspecified visual loss: Secondary | ICD-10-CM | POA: Diagnosis present

## 2021-05-20 DIAGNOSIS — Z20822 Contact with and (suspected) exposure to covid-19: Secondary | ICD-10-CM | POA: Diagnosis not present

## 2021-05-20 DIAGNOSIS — R296 Repeated falls: Secondary | ICD-10-CM | POA: Diagnosis present

## 2021-05-20 DIAGNOSIS — Z7901 Long term (current) use of anticoagulants: Secondary | ICD-10-CM | POA: Diagnosis not present

## 2021-05-20 DIAGNOSIS — S0232XA Fracture of orbital floor, left side, initial encounter for closed fracture: Secondary | ICD-10-CM | POA: Diagnosis not present

## 2021-05-20 DIAGNOSIS — I480 Paroxysmal atrial fibrillation: Secondary | ICD-10-CM | POA: Diagnosis not present

## 2021-05-20 DIAGNOSIS — F10239 Alcohol dependence with withdrawal, unspecified: Secondary | ICD-10-CM | POA: Diagnosis not present

## 2021-05-20 DIAGNOSIS — I10 Essential (primary) hypertension: Secondary | ICD-10-CM | POA: Diagnosis not present

## 2021-05-20 DIAGNOSIS — Z79899 Other long term (current) drug therapy: Secondary | ICD-10-CM | POA: Diagnosis not present

## 2021-05-20 DIAGNOSIS — M6282 Rhabdomyolysis: Secondary | ICD-10-CM | POA: Diagnosis not present

## 2021-05-20 DIAGNOSIS — E860 Dehydration: Secondary | ICD-10-CM | POA: Diagnosis not present

## 2021-05-20 DIAGNOSIS — R7989 Other specified abnormal findings of blood chemistry: Secondary | ICD-10-CM | POA: Diagnosis present

## 2021-05-20 DIAGNOSIS — Z6828 Body mass index (BMI) 28.0-28.9, adult: Secondary | ICD-10-CM | POA: Diagnosis not present

## 2021-05-20 DIAGNOSIS — Z993 Dependence on wheelchair: Secondary | ICD-10-CM | POA: Diagnosis not present

## 2021-05-20 DIAGNOSIS — K760 Fatty (change of) liver, not elsewhere classified: Secondary | ICD-10-CM | POA: Diagnosis not present

## 2021-05-20 LAB — CBC WITH DIFFERENTIAL/PLATELET
Abs Immature Granulocytes: 0.05 10*3/uL (ref 0.00–0.07)
Basophils Absolute: 0 10*3/uL (ref 0.0–0.1)
Basophils Relative: 0 %
Eosinophils Absolute: 0.1 10*3/uL (ref 0.0–0.5)
Eosinophils Relative: 1 %
HCT: 32.3 % — ABNORMAL LOW (ref 39.0–52.0)
Hemoglobin: 10.7 g/dL — ABNORMAL LOW (ref 13.0–17.0)
Immature Granulocytes: 1 %
Lymphocytes Relative: 16 %
Lymphs Abs: 1.2 10*3/uL (ref 0.7–4.0)
MCH: 35 pg — ABNORMAL HIGH (ref 26.0–34.0)
MCHC: 33.1 g/dL (ref 30.0–36.0)
MCV: 105.6 fL — ABNORMAL HIGH (ref 80.0–100.0)
Monocytes Absolute: 0.7 10*3/uL (ref 0.1–1.0)
Monocytes Relative: 10 %
Neutro Abs: 5.5 10*3/uL (ref 1.7–7.7)
Neutrophils Relative %: 72 %
Platelets: 123 10*3/uL — ABNORMAL LOW (ref 150–400)
RBC: 3.06 MIL/uL — ABNORMAL LOW (ref 4.22–5.81)
RDW: 12.9 % (ref 11.5–15.5)
WBC: 7.6 10*3/uL (ref 4.0–10.5)
nRBC: 0 % (ref 0.0–0.2)

## 2021-05-20 LAB — COMPREHENSIVE METABOLIC PANEL
ALT: 109 U/L — ABNORMAL HIGH (ref 0–44)
AST: 331 U/L — ABNORMAL HIGH (ref 15–41)
Albumin: 2.6 g/dL — ABNORMAL LOW (ref 3.5–5.0)
Alkaline Phosphatase: 57 U/L (ref 38–126)
Anion gap: 10 (ref 5–15)
BUN: 15 mg/dL (ref 8–23)
CO2: 23 mmol/L (ref 22–32)
Calcium: 7.5 mg/dL — ABNORMAL LOW (ref 8.9–10.3)
Chloride: 106 mmol/L (ref 98–111)
Creatinine, Ser: 1.06 mg/dL (ref 0.61–1.24)
GFR, Estimated: >60 mL/min (ref >60)
Glucose, Bld: 80 mg/dL (ref 70–99)
Potassium: 4 mmol/L (ref 3.5–5.1)
Sodium: 139 mmol/L (ref 135–145)
Total Bilirubin: 2.6 mg/dL — ABNORMAL HIGH (ref 0.3–1.2)
Total Protein: 5 g/dL — ABNORMAL LOW (ref 6.5–8.1)

## 2021-05-20 LAB — MAGNESIUM: Magnesium: 1.4 mg/dL — ABNORMAL LOW (ref 1.7–2.4)

## 2021-05-20 MED ORDER — ACETAMINOPHEN 325 MG PO TABS
650.0000 mg | ORAL_TABLET | Freq: Four times a day (QID) | ORAL | Status: DC | PRN
  Administered 2021-05-20 – 2021-05-22 (×5): 650 mg via ORAL
  Filled 2021-05-20 (×5): qty 2

## 2021-05-20 MED ORDER — HYDRALAZINE HCL 20 MG/ML IJ SOLN: 10.0000 mg | INTRAMUSCULAR | Status: DC | PRN

## 2021-05-20 MED ORDER — METOPROLOL TARTRATE 5 MG/5ML IV SOLN: 5.0000 mg | INTRAVENOUS | Status: DC | PRN

## 2021-05-20 MED ORDER — IPRATROPIUM-ALBUTEROL 0.5-2.5 (3) MG/3ML IN SOLN: 3.0000 mL | RESPIRATORY_TRACT | Status: DC | PRN

## 2021-05-20 MED ORDER — SENNOSIDES-DOCUSATE SODIUM 8.6-50 MG PO TABS: 1.0000 | ORAL_TABLET | Freq: Every evening | ORAL | Status: DC | PRN

## 2021-05-20 MED ORDER — TRAZODONE HCL 50 MG PO TABS: 50.0000 mg | ORAL_TABLET | Freq: Every evening | ORAL | Status: DC | PRN

## 2021-05-20 MED ORDER — LACTATED RINGERS IV SOLN: INTRAVENOUS | Status: DC

## 2021-05-20 NOTE — ED Notes (Signed)
O2 drops to 85% on RA when pt is sleeping. O2 at 2LPM initiated.

## 2021-05-20 NOTE — Plan of Care (Signed)
  Problem: Education: Goal: Knowledge of General Education information will improve Description Including pain rating scale, medication(s)/side effects and non-pharmacologic comfort measures Outcome: Progressing   

## 2021-05-20 NOTE — ED Notes (Signed)
Pt is easy to arouse but falls back to sleep. Talks to himself when awake. Took PO meds then fell back to sleep. Cardiac monitoring in place. O2 initiated for comfort. Pt refused O2 at this time. Will continue to monitor.

## 2021-05-20 NOTE — ED Notes (Signed)
Pt woke up at this time very confused, sweaty and complains of headache. Redirected by staff at this time. Pt continues to talk to himself and started moving around the bed. Removed his primofit which was reapplied by this RN. Pt redirected multiple times. Pt is confused at this time. CIWA done at this time, see documentation. Will continue to monitor.

## 2021-05-20 NOTE — ED Notes (Signed)
Angelique Blonder Difilippi niece [place] [phone] requesting an update on the patient

## 2021-05-20 NOTE — ED Notes (Signed)
Pt is now observed sleeping. Pt is calm and no acute distress observed. Cardiac monitoring in place.

## 2021-05-20 NOTE — Progress Notes (Signed)
PROGRESS NOTE    Taylor Myers  DXA:[id number] DOB: [birth year] DOA: 05/19/2021 PCP: Gaspar Garbe, MD   Brief Narrative:  70 year old with history of A. fib on Eliquis, HTN, wheelchair-bound due to weakness fell out of wheelchair last month and went to [facility] for evaluation of orbital fracture.  He is scheduled for surgery on 05/22/2021.  In the meantime patient has been progressively feeling weak and continues to drink every day.  His brother brought him to the hospital for further evaluation.  He was found to be in acute kidney injury with transaminitis.  He was admitted to the hospital for further evaluation.   Assessment & Plan:   Principal Problem:   AKI (acute kidney injury) (HCC) Active Problems:   Essential hypertension   Long term (current) use of anticoagulants   Paroxysmal atrial fibrillation (HCC)   Elevated LFTs   Wheelchair bound   Closed fracture of left orbital floor (HCC)  Acute kidney injury History of BPH - Baseline creatinine 0.7.  Admission creatinine 1.33>1.06.  Continue IV fluids.  Avoid nephrotoxic agents.  CT abdomen pelvis is negative for any acute obstruction - Continue Flomax and Ditropan  Transaminitis - Alcohol 5 9.  Closely continue to monitor.  RUQ ultrasound shows hepatic steatosis  Closed left orbital fracture - Secondary to trauma he sustained about a month ago, follows with ophthalmology and has scheduled surgery on 05/22/2021.  His Eliquis is on hold  Macrocytosis - Suspect from alcohol use.  Check B12, folate and TSH  Paroxysmal A. fib - Eliquis currently on hold - Lopressor 25 mg twice daily  Wheelchair-bound due to generalized weakness - Supportive care.  Essential hypertension - Stable.  Norvasc 5 mg orally daily     DVT prophylaxis: SCDs Start: 05/19/21 2108 Code Status: Full code Family Communication:  Called His brother  Status is: Observation  The patient remains OBS appropriate and will d/c  before 2 midnights.  Patient is still extremely weak and drowsy.  Requires IV fluids to help with his renal function and monitoring of his LFTs which is trending upwards for now.  Dispo: The patient is from: Home              Anticipated d/c is to: Home              Patient currently is not medically stable to d/c.   Difficult to place patient No       Subjective: Drowsy but wakes up easily.  No focal neurodeficits.  Overall very poor historian  Review of Systems Otherwise negative except as per HPI, including: General: Denies fever, chills, night sweats or unintended weight loss. Resp: Denies cough, wheezing, shortness of breath. Cardiac: Denies chest pain, palpitations, orthopnea, paroxysmal nocturnal dyspnea. GI: Denies abdominal pain, nausea, vomiting, diarrhea or constipation GU: Denies dysuria, frequency, hesitancy or incontinence MS: Denies muscle aches, joint pain or swelling Neuro: Denies headache, neurologic deficits (focal weakness, numbness, tingling), abnormal gait Psych: Denies anxiety, depression, SI/HI/AVH Skin: Denies new rashes or lesions ID: Denies sick contacts, exotic exposures, travel  Examination:  General exam: Not in acute distress.  Drowsy but easily arousable le  Respiratory system: Clear to auscultation. Respiratory effort normal. Cardiovascular system: S1 & S2 heard, RRR. No JVD, murmurs, rubs, gallops or clicks. No pedal edema. Gastrointestinal system: Abdomen is nondistended, soft and nontender. No organomegaly or masses felt. Normal bowel sounds heard. Central nervous system: Alert and oriented. No focal neurological deficits. Extremities: Symmetric 5 x 5  power. Skin: No rashes, lesions or ulcers Psychiatry: Poor judgment and insight    Objective: Vitals:   05/20/21 0342 05/20/21 0500 05/20/21 0712 05/20/21 0800  BP: 104/89 112/70 107/75 112/64  Pulse: 94 75 80 77  Resp: 18  20 18   Temp:   98.9 F (37.2 C)   TempSrc:   Oral   SpO2: 98%  97% 96% 94%  Weight:      Height:        Intake/Output Summary (Last 24 hours) at 05/20/2021 0841 Last data filed at 05/19/2021 2332 Gross per 24 hour  Intake 1305 ml  Output --  Net 1305 ml   Filed Weights   05/19/21 1125  Weight: 84.4 kg     Data Reviewed:   CBC: Recent Labs  Lab 05/19/21 1157 05/20/21 0346  WBC 9.1 7.6  NEUTROABS 7.8* 5.5  HGB 12.1* 10.7*  HCT 36.7* 32.3*  MCV 104.9* 105.6*  PLT 106* 123*   Basic Metabolic Panel: Recent Labs  Lab 05/19/21 1157 05/20/21 0346  NA 139 139  K 3.5 4.0  CL 103 106  CO2 24 23  GLUCOSE 103* 80  BUN 16 15  CREATININE 1.33* 1.06  CALCIUM 8.2* 7.5*  MG  --  1.4*   GFR: Estimated Creatinine Clearance: 63.9 mL/min (by C-G formula based on SCr of 1.06 mg/dL). Liver Function Tests: Recent Labs  Lab 05/19/21 1157 05/20/21 0346  AST 247* 331*  ALT 85* 109*  ALKPHOS 68 57  BILITOT 3.3* 2.6*  PROT 5.9* 5.0*  ALBUMIN 3.1* 2.6*   No results for input(s): LIPASE, AMYLASE in the last 168 hours. Recent Labs  Lab 05/19/21 1952  AMMONIA 20   Coagulation Profile: Recent Labs  Lab 05/19/21 1737  INR 1.4*   Cardiac Enzymes: No results for input(s): CKTOTAL, CKMB, CKMBINDEX, TROPONINI in the last 168 hours. BNP (last 3 results) No results for input(s): PROBNP in the last 8760 hours. HbA1C: No results for input(s): HGBA1C in the last 72 hours. CBG: No results for input(s): GLUCAP in the last 168 hours. Lipid Profile: No results for input(s): CHOL, HDL, LDLCALC, TRIG, CHOLHDL, LDLDIRECT in the last 72 hours. Thyroid Function Tests: No results for input(s): TSH, T4TOTAL, FREET4, T3FREE, THYROIDAB in the last 72 hours. Anemia Panel: No results for input(s): VITAMINB12, FOLATE, FERRITIN, TIBC, IRON, RETICCTPCT in the last 72 hours. Sepsis Labs: No results for input(s): PROCALCITON, LATICACIDVEN in the last 168 hours.  Recent Results (from the past 240 hour(s))  Resp Panel by RT-PCR (Flu A&B, Covid)  Nasopharyngeal Swab     Status: None   Collection Time: 05/19/21 11:57 AM   Specimen: Nasopharyngeal Swab; Nasopharyngeal(NP) swabs in vial transport medium  Result Value Ref Range Status   SARS Coronavirus 2 by RT PCR NEGATIVE NEGATIVE Final    Comment: (NOTE) SARS-CoV-2 target nucleic acids are NOT DETECTED.  The SARS-CoV-2 RNA is generally detectable in upper respiratory specimens during the acute phase of infection. The lowest concentration of SARS-CoV-2 viral copies this assay can detect is 138 copies/mL. A negative result does not preclude SARS-Cov-2 infection and should not be used as the sole basis for treatment or other patient management decisions. A negative result may occur with  improper specimen collection/handling, submission of specimen other than nasopharyngeal swab, presence of viral mutation(s) within the areas targeted by this assay, and inadequate number of viral copies(<138 copies/mL). A negative result must be combined with clinical observations, patient history, and epidemiological information. The expected result is Negative.  Fact Sheet for Patients:  BloggerCourse.com  Fact Sheet for Healthcare Providers:  SeriousBroker.it  This test is no t yet approved or cleared by the [place] FDA and  has been authorized for detection and/or diagnosis of SARS-CoV-2 by FDA under an Emergency Use Authorization (EUA). This EUA will remain  in effect (meaning this test can be used) for the duration of the COVID-19 declaration under Section 564(b)(1) of the Act, 21 U.S.C.section 360bbb-3(b)(1), unless the authorization is terminated  or revoked sooner.       Influenza A by PCR NEGATIVE NEGATIVE Final   Influenza B by PCR NEGATIVE NEGATIVE Final    Comment: (NOTE) The Xpert Xpress SARS-CoV-2/FLU/RSV plus assay is intended as an aid in the diagnosis of influenza from Nasopharyngeal swab specimens and should not be  used as a sole basis for treatment. Nasal washings and aspirates are unacceptable for Xpert Xpress SARS-CoV-2/FLU/RSV testing.  Fact Sheet for Patients: BloggerCourse.com  Fact Sheet for Healthcare Providers: SeriousBroker.it  This test is not yet approved or cleared by the [place] FDA and has been authorized for detection and/or diagnosis of SARS-CoV-2 by FDA under an Emergency Use Authorization (EUA). This EUA will remain in effect (meaning this test can be used) for the duration of the COVID-19 declaration under Section 564(b)(1) of the Act, 21 U.S.C. section 360bbb-3(b)(1), unless the authorization is terminated or revoked.  Performed at [facility], [address] [place], [place] [address]          Radiology Studies: CT Abdomen Pelvis W Contrast  Result Date: 05/19/2021 CLINICAL DATA:  Abdominal abscess/infection suspected EXAM: CT ABDOMEN AND PELVIS WITH CONTRAST TECHNIQUE: Multidetector CT imaging of the abdomen and pelvis was performed using the standard protocol following bolus administration of intravenous contrast. CONTRAST:  81mL OMNIPAQUE IOHEXOL 350 MG/ML SOLN COMPARISON:  None. FINDINGS: Lower chest: Lung bases are clear. Hepatobiliary: Low-attenuation liver consistent hepatic steatosis. Gallbladder normal. Pancreas: Pancreas is normal. No ductal dilatation. No pancreatic inflammation. Spleen: Normal spleen Adrenals/urinary tract: Adrenal glands normal. Nonobstructing calculus in the mid RIGHT kidney. Bilateral renal cortical scarring. Large benign cyst of the RIGHT kidney. Ureters and bladder normal. Stomach/Bowel: Stomach, small bowel, appendix, and cecum are normal. The colon and rectosigmoid colon are normal. Vascular/Lymphatic: Abdominal aorta is normal caliber with atherosclerotic calcification. There is no retroperitoneal or periportal lymphadenopathy. No pelvic lymphadenopathy. Reproductive: Prostate  unremarkable Other: No free fluid.  No abscess Musculoskeletal: Degenerative changes of the spine. No acute findings IMPRESSION: 1. No abscess or infection identified in the abdomen pelvis. 2. Hepatic steatosis. 3. No bowel obstruction or inflammation. Electronically Signed   By: Genevive Bi M.D.   On: 05/19/2021 18:58   DG Chest Port 1 View  Result Date: 05/19/2021 CLINICAL DATA:  Larey Seat this morning, weakness today, history hypertension EXAM: PORTABLE CHEST 1 VIEW COMPARISON:  Portable exam 1206 hours compared to 05/11/2021 FINDINGS: Rotated to the RIGHT. Enlargement of cardiac silhouette. Mediastinal contours and pulmonary vascularity normal. Atherosclerotic calcification aorta. Lungs clear. No acute infiltrate, pleural effusion, or pneumothorax. Advanced LEFT glenohumeral degenerative changes and old posttraumatic deformity of proximal LEFT humerus. Osseous demineralization. IMPRESSION: Enlargement of cardiac silhouette. No acute abnormalities. Electronically Signed   By: Ulyses Southward M.D.   On: 05/19/2021 12:50   VAS [place] LOWER EXTREMITY VENOUS (DVT) (ONLY [facility] & [facility] 7a-7p)  Result Date: 05/19/2021  Lower Venous DVT Study Patient Name:  COLEN ELTZROTH  Date of Exam:   05/19/2021 Medical Rec #: [id number]  Accession #:    [id number] Date of Birth: [birth year]   Patient Gender: M Patient Age:   41 years Exam Location:  [facility] Procedure:      VAS [place] LOWER EXTREMITY VENOUS (DVT) Referring Phys: Mancel Bale --------------------------------------------------------------------------------  Indications: Edema.  Limitations: Body habitus, poor ultrasound/tissue interface and restricted mobility, patient position. Comparison Study: 07/02/2020 negative bilateral lower extremity venous duplex Performing Technologist: Gertie Fey MHA, RDMS, RVT, RDCS  Examination Guidelines: A complete evaluation includes B-mode imaging, spectral Doppler, color Doppler, and power Doppler as needed of all accessible  portions of each vessel. Bilateral testing is considered an integral part of a complete examination. Limited examinations for reoccurring indications may be performed as noted. The reflux portion of the exam is performed with the patient in reverse Trendelenburg.  +---------+---------------+---------+-----------+----------+--------------+ RIGHT    CompressibilityPhasicitySpontaneityPropertiesThrombus Aging +---------+---------------+---------+-----------+----------+--------------+ CFV      Full           Yes      Yes                                 +---------+---------------+---------+-----------+----------+--------------+ SFJ      Full                                                        +---------+---------------+---------+-----------+----------+--------------+ FV Prox  Full                                                        +---------+---------------+---------+-----------+----------+--------------+ FV Mid   Full                                                        +---------+---------------+---------+-----------+----------+--------------+ FV DistalFull                                                        +---------+---------------+---------+-----------+----------+--------------+ PFV      Full                                                        +---------+---------------+---------+-----------+----------+--------------+ POP      Full           Yes      Yes                                 +---------+---------------+---------+-----------+----------+--------------+ PTV      Full                                                        +---------+---------------+---------+-----------+----------+--------------+  PERO     Full                                                        +---------+---------------+---------+-----------+----------+--------------+   +---------+---------------+---------+-----------+----------+--------------+ LEFT      CompressibilityPhasicitySpontaneityPropertiesThrombus Aging +---------+---------------+---------+-----------+----------+--------------+ CFV      Full           Yes      Yes                                 +---------+---------------+---------+-----------+----------+--------------+ SFJ      Full                                                        +---------+---------------+---------+-----------+----------+--------------+ FV Prox  Full                                                        +---------+---------------+---------+-----------+----------+--------------+ FV Mid   Full                                                        +---------+---------------+---------+-----------+----------+--------------+ FV DistalFull                                                        +---------+---------------+---------+-----------+----------+--------------+ PFV      Full                                                        +---------+---------------+---------+-----------+----------+--------------+ POP      Full           Yes      Yes                                 +---------+---------------+---------+-----------+----------+--------------+ PTV      Full                                                        +---------+---------------+---------+-----------+----------+--------------+ PERO     Full                                                        +---------+---------------+---------+-----------+----------+--------------+       Summary: RIGHT: - There is no evidence of deep vein thrombosis in the lower extremity.  - No cystic structure found in the popliteal fossa.  LEFT: - There is no evidence of deep vein thrombosis in the lower extremity.  - No cystic structure found in the popliteal fossa.  *See table(s) above for measurements and observations. Electronically signed by Waverly Ferrari MD on 05/19/2021 at 3:44:07 PM.    Final    US Abdomen Limited  RUQ (LIVER/GB)  Result Date: 05/19/2021 CLINICAL DATA:  Elevated liver function tests EXAM: ULTRASOUND ABDOMEN LIMITED RIGHT UPPER QUADRANT COMPARISON:  CT 05/19/2021 FINDINGS: Gallbladder: No gallstones or wall thickening visualized. No sonographic Murphy sign noted by sonographer. Common bile duct: Diameter: 5 mm in proximal diameter Liver: The hepatic parenchymal echogenicity is diffusely increased and there is coarsening of the hepatic echotexture and poor acoustic through transmission in keeping with changes of moderate hepatic steatosis. No focal intrahepatic masses are seen. There is no intrahepatic biliary ductal dilation. Portal vein is patent on color Doppler imaging with normal direction of blood flow towards the liver. Other: 6.7 cm simple cyst noted within the visualized right kidney. IMPRESSION: Moderate hepatic steatosis. Electronically Signed   By: Helyn Numbers M.D.   On: 05/19/2021 20:34        Scheduled Meds:  allopurinol  300 mg Oral QHS   amLODipine  5 mg Oral Daily   atorvastatin  40 mg Oral QHS   folic acid  1 mg Oral Daily   metoprolol tartrate  25 mg Oral BID   multivitamin with minerals  1 tablet Oral Daily   oxybutynin  5 mg Oral QHS   tamsulosin  0.4 mg Oral QHS   thiamine  100 mg Oral Daily   Or   thiamine  100 mg Intravenous Daily   Continuous Infusions:  lactated ringers 100 mL/hr at 05/20/21 0714     LOS: 0 days   Time spent= 35 mins    Dari Carpenito Joline Maxcy, MD [facility]  If 7PM-7AM, please contact night-coverage  05/20/2021, 8:41 AM

## 2021-05-20 NOTE — ED Notes (Signed)
Pt is currently awake and observed with tremors. Also complains of 10/10 headache. Pt is fidgety on bed. CIWA is currently 15. Will give Ativan accordingly, see MAR. Cardiac monitoring continued.

## 2021-05-21 DIAGNOSIS — E440 Moderate protein-calorie malnutrition: Secondary | ICD-10-CM | POA: Insufficient documentation

## 2021-05-21 LAB — CBC
HCT: 33.6 % — ABNORMAL LOW (ref 39.0–52.0)
Hemoglobin: 10.7 g/dL — ABNORMAL LOW (ref 13.0–17.0)
MCH: 34.1 pg — ABNORMAL HIGH (ref 26.0–34.0)
MCHC: 31.8 g/dL (ref 30.0–36.0)
MCV: 107 fL — ABNORMAL HIGH (ref 80.0–100.0)
Platelets: 104 10*3/uL — ABNORMAL LOW (ref 150–400)
RBC: 3.14 MIL/uL — ABNORMAL LOW (ref 4.22–5.81)
RDW: 12.8 % (ref 11.5–15.5)
WBC: 7.4 10*3/uL (ref 4.0–10.5)
nRBC: 0 % (ref 0.0–0.2)

## 2021-05-21 LAB — CK: Total CK: 2388 U/L — ABNORMAL HIGH (ref 49–397)

## 2021-05-21 LAB — COMPREHENSIVE METABOLIC PANEL
ALT: 160 U/L — ABNORMAL HIGH (ref 0–44)
AST: 467 U/L — ABNORMAL HIGH (ref 15–41)
Albumin: 2.4 g/dL — ABNORMAL LOW (ref 3.5–5.0)
Alkaline Phosphatase: 61 U/L (ref 38–126)
Anion gap: 11 (ref 5–15)
BUN: 10 mg/dL (ref 8–23)
CO2: 23 mmol/L (ref 22–32)
Calcium: 7.7 mg/dL — ABNORMAL LOW (ref 8.9–10.3)
Chloride: 106 mmol/L (ref 98–111)
Creatinine, Ser: 0.98 mg/dL (ref 0.61–1.24)
GFR, Estimated: >60 mL/min (ref >60)
Glucose, Bld: 71 mg/dL (ref 70–99)
Potassium: 3.6 mmol/L (ref 3.5–5.1)
Sodium: 140 mmol/L (ref 135–145)
Total Bilirubin: 2.4 mg/dL — ABNORMAL HIGH (ref 0.3–1.2)
Total Protein: 5.1 g/dL — ABNORMAL LOW (ref 6.5–8.1)

## 2021-05-21 LAB — AMMONIA
Ammonia: 22 umol/L (ref 9–35)
Ammonia: 23 umol/L (ref 9–35)

## 2021-05-21 LAB — MAGNESIUM: Magnesium: 1.3 mg/dL — ABNORMAL LOW (ref 1.7–2.4)

## 2021-05-21 LAB — VITAMIN B12: Vitamin B-12: 995 pg/mL — ABNORMAL HIGH (ref 180–914)

## 2021-05-21 LAB — TSH: TSH: 1.145 u[IU]/mL (ref 0.350–4.500)

## 2021-05-21 MED ORDER — MAGNESIUM SULFATE 4 GM/100ML IV SOLN
4.0000 g | Freq: Once | INTRAVENOUS | Status: AC
  Administered 2021-05-21: 4 g via INTRAVENOUS
  Filled 2021-05-21: qty 100

## 2021-05-21 MED ORDER — ENSURE ENLIVE PO LIQD
237.0000 mL | Freq: Three times a day (TID) | ORAL | Status: DC
  Administered 2021-05-21 – 2021-05-22 (×3): 237 mL via ORAL

## 2021-05-21 MED ORDER — POTASSIUM CHLORIDE CRYS ER 20 MEQ PO TBCR
40.0000 meq | EXTENDED_RELEASE_TABLET | Freq: Once | ORAL | Status: AC
  Administered 2021-05-21: 40 meq via ORAL
  Filled 2021-05-21: qty 2

## 2021-05-21 MED ORDER — LACTATED RINGERS IV SOLN: INTRAVENOUS | Status: AC
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
## 2021-05-21 NOTE — Evaluation (Signed)
Physical Therapy Evaluation Patient Details Name: Taylor Myers MRN: [id number] DOB: October 20, 1950 Today's Date: 05/21/2021  History of Present Illness  70 year old white male presented to the ER 05/19/21 after falling out of his wheelchair. Recent incr consumption ETOH with dehydration and AKI; CIWA initiated   PMH- atrial fibrillation on chronic anticoagulation, hypertension, wheelchair-bound status due to weakness, recent orbital fx with surgery pending 9/30  Clinical Impression  Pt admitted secondary to problem above with deficits below. Patient currently very lethargic and unable to provide a reliable history (his differs from H&P). Per pt, PTA patient was independently mobilizing with wheelchair (including in community). He reports h/o one fall during transfer.  Pt currently requires +2 min assist for standing with simulated grab bar. MD in during session and plans to adjust meds to try to improve pt's alertness. If this improves, he may progress to a point where SNF will not be needed, but currently is unable to mobilize independently and remains high fall risk.  Anticipate patient will benefit from PT to address problems listed below.Will continue to follow acutely to maximize functional mobility independence and safety.          Recommendations for follow up therapy are one component of a multi-disciplinary discharge planning process, led by the attending physician.  Recommendations may be updated based on patient status, additional functional criteria and insurance authorization.  Follow Up Recommendations SNF (if pt becomes more alert and better able to mobilize, may be able to go home with [facility])    Equipment Recommendations  None recommended by PT (will need to further assess when pt more alert)    Recommendations for Other Services       Precautions / Restrictions Precautions Precautions: Fall Precaution Comments: recent fall (?falls) during transfers      Mobility  Bed  Mobility Overal bed mobility: Needs Assistance Bed Mobility: Supine to Sit;Sit to Supine     Supine to sit: Min assist Sit to supine: Mod assist   General bed mobility comments: assist to initiate legs toward EOB, assist to raise torso to fully upright; pt able to raise legs up onto bed but very crosswise in bed and mod assist with max cues to center himself in bed    Transfers Overall transfer level: Needs assistance Equipment used:  (holding handles on back of recliner) Transfers: Sit to/from Stand Sit to Stand: Min assist;+2 physical assistance;+2 safety/equipment         General transfer comment: used handles on recliner to simulate grab bars that pt reports he uses at home to stand  Ambulation/Gait Ambulation/Gait assistance: Min assist;+2 physical assistance;+2 safety/equipment Gait Distance (Feet): 1 Feet Assistive device:  (handles on back of recliner) Gait Pattern/deviations: Step-to pattern     General Gait Details: side-stepping toward [facility]; pt very groggy/lethargic (even in standing with eye-closing) and further gait not safe  Stairs            Wheelchair Mobility    Modified Rankin (Stroke Patients Only)       Balance Overall balance assessment: Needs assistance Sitting-balance support: No upper extremity supported;Feet supported Sitting balance-Leahy Scale: Fair     Standing balance support: Bilateral upper extremity supported Standing balance-Leahy Scale: Poor                               Pertinent Vitals/Pain Pain Assessment: Faces Faces Pain Scale: Hurts a little bit Pain Location: all over Pain Descriptors / Indicators: Aching  Pain Intervention(s): Limited activity within patient's tolerance;Patient requesting pain meds-RN notified;Repositioned    Home Living Family/patient expects to be discharged to:: Private residence Living Arrangements: Parent (Father, 72 yo) Available Help at Discharge: Personal care  attendant;Available PRN/intermittently (2-3 hrs/day, 2-3 days/week)           Home Equipment: Wheelchair - manual Additional Comments: pt very groggy and tangential; unable to get accurate home set-up    Prior Function Level of Independence: Needs assistance   Gait / Transfers Assistance Needed: reports independently transfers to wheelchair (sometimes scooting, sometimes standing with grab bar), self-propels; reports went to jazz concert earlier this week (?accuracy); takes a taxi to get his hair cut  ADL's / Homemaking Assistance Needed: aide assists with homemaking and assists father with ADLs        Hand Dominance        Extremity/Trunk Assessment   Upper Extremity Assessment Upper Extremity Assessment: Defer to OT evaluation    Lower Extremity Assessment Lower Extremity Assessment: Generalized weakness       Communication   Communication: Other (comment) (?confabulating)  Cognition Arousal/Alertness: Lethargic;Suspect due to medications (CIWA protocol) Behavior During Therapy: Flat affect Overall Cognitive Status: Impaired/Different from baseline Area of Impairment: Orientation;Attention;Following commands;Awareness;Problem solving                 Orientation Level: Situation (provides differing history than H&P for reason for admission (?accurate)) Current Attention Level: Sustained   Following Commands: Follows one step commands with increased time   Awareness: Intellectual Problem Solving: Slow processing;Decreased initiation;Difficulty sequencing;Requires verbal cues General Comments: pt reporting he thinks he hallucinated that he saw his brother (who lives in [place]) wearing a wig; tangential talk about rolling in his wheelchair up/down the street at 2 a.m. with cane and samurai sword      General Comments      Exercises     Assessment/Plan    PT Assessment Patient needs continued PT services  PT Problem List Decreased strength;Decreased  activity tolerance;Decreased balance;Decreased mobility;Decreased cognition;Decreased knowledge of use of DME;Decreased safety awareness       PT Treatment Interventions DME instruction;Gait training;Functional mobility training;Therapeutic activities;Therapeutic exercise;Balance training;Cognitive remediation;Patient/family education;Wheelchair mobility training    PT Goals (Current goals can be found in the Care Plan section)  Acute Rehab PT Goals Patient Stated Goal: unable to state; agrees to therapy PT Goal Formulation: With patient Time For Goal Achievement: 06/04/21 Potential to Achieve Goals: Good    Frequency Min 3X/week   Barriers to discharge Decreased caregiver support      Co-evaluation               AM-PAC PT "6 Clicks" Mobility  Outcome Measure Help needed turning from your back to your side while in a flat bed without using bedrails?: A Little Help needed moving from lying on your back to sitting on the side of a flat bed without using bedrails?: A Little Help needed moving to and from a bed to a chair (including a wheelchair)?: Total Help needed standing up from a chair using your arms (e.g., wheelchair or bedside chair)?: Total Help needed to walk in hospital room?: Total Help needed climbing 3-5 steps with a railing? : Total 6 Click Score: 10    End of Session Equipment Utilized During Treatment: Gait belt Activity Tolerance: Patient limited by lethargy Patient left: in bed;with call bell/phone within reach;with bed alarm set Nurse Communication: Patient requests pain meds PT Visit Diagnosis: Unsteadiness on feet (R26.81);History of falling (Z91.81);Muscle  weakness (generalized) (M62.81)    Time: 3532-9924 PT Time Calculation (min) (ACUTE ONLY): 33 min   Charges:   PT Evaluation $PT Eval Moderate Complexity: 1 Mod           Jerolyn Center, PT Pager [phone]   Zena Amos 05/21/2021, 10:42 AM

## 2021-05-21 NOTE — Progress Notes (Signed)
Initial Nutrition Assessment  DOCUMENTATION CODES:   Non-severe (moderate) malnutrition in context of chronic illness  INTERVENTION:  Encourage PO intake Ensure TID  NUTRITION DIAGNOSIS:   Moderate Malnutrition related to chronic illness (alcohol abuse) as evidenced by moderate muscle depletion, moderate fat depletion.  GOAL:   Patient will meet greater than or equal to 90% of their needs  MONITOR:   PO intake, Skin  REASON FOR ASSESSMENT:   Malnutrition Screening Tool    ASSESSMENT:   Pt presents to hospital due to fall from wheelchair. Admitted (9/28) for concerns with AKI and elevated LFTs, possible hepatitis. Prior fall August 2022 resulting in L orbital fracture. PMH includes afib on chronic anticoagulation and HTN.  Pt was drowsy during visit, no family present to obtain information from. Per chart review, pt is wheelchair bound at home for generalized weakness and takes care of his 23 year old father. Recent increase in alcohol use noted, pt on CIWA protocol. Pt on IV fluids for rhabdomyolysis and AKI. PT states may need SNF.  Medications: folic acid, MVI, potassium chloride, thiamine, magnesium sulfate  Labs: hypomagnesemia (1.3), elevated LFTs (AST 467, ALT 160)   NUTRITION - FOCUSED PHYSICAL EXAM:  Flowsheet Row Most Recent Value  Orbital Region Moderate depletion  Upper Arm Region No depletion  Thoracic and Lumbar Region No depletion  Buccal Region Moderate depletion  Temple Region Moderate depletion  Clavicle Bone Region No depletion  Clavicle and Acromion Bone Region Mild depletion  Scapular Bone Region Mild depletion  Dorsal Hand Mild depletion  Patellar Region Moderate depletion  Anterior Thigh Region Moderate depletion  Posterior Calf Region Moderate depletion  Edema (RD Assessment) --  [non-pitting rt side]  Hair Reviewed  Eyes Reviewed  Mouth Reviewed  Skin Reviewed  Nails Reviewed       Diet Order:   Diet Order             Diet  regular Room service appropriate? Yes; Fluid consistency: Thin  Diet effective now                   EDUCATION NEEDS:   Not appropriate for education at this time  Skin:  Skin Assessment: Skin Integrity Issues: Skin Integrity Issues:: DTI DTI: right buttocks  Last BM:  Unknown  Height:   Ht Readings from Last 1 Encounters:  05/19/21 5\' 9"  (1.753 m)    Weight:   Wt Readings from Last 1 Encounters:  05/21/21 87.1 kg    Ideal Body Weight:  72.7 kg  BMI:  Body mass index is 28.36 kg/m.  Estimated Nutritional Needs:   Kcal:  2000-2200  Protein:  90-110g  Fluid:  >2L  05/23/21, RDN, LDN Clinical Nutrition

## 2021-05-21 NOTE — Progress Notes (Signed)
PROGRESS NOTE    Taylor Myers  NWG:[id number] DOB: 1951/03/01 DOA: 05/19/2021 PCP: Gaspar Garbe, MD   Brief Narrative:  70 year old with history of A. fib on Eliquis, HTN, wheelchair-bound due to weakness fell out of wheelchair last month and went to [facility] for evaluation of orbital fracture.  He is scheduled for surgery on 05/22/2021.  In the meantime patient has been progressively feeling weak and continues to drink every day.  His brother brought him to the hospital for further evaluation.  He was found to be in acute kidney injury with transaminitis.  He was admitted to the hospital for further evaluation.   Assessment & Plan:   Principal Problem:   AKI (acute kidney injury) (HCC) Active Problems:   Essential hypertension   Long term (current) use of anticoagulants   Paroxysmal atrial fibrillation (HCC)   Elevated LFTs   Wheelchair bound   Closed fracture of left orbital floor (HCC)  Acute kidney injury, improving History of BPH - Baseline creatinine 0.7.  Admission creatinine 1.33>1.06 >0.98.  Continue IV fluids.  Avoid nephrotoxic agents.  CT abdomen pelvis is negative for any acute obstruction - Continue Flomax and Ditropan  Rhabdomyolysis - CK 2388, continue IV fluids  Transaminitis, worsening - LFTs rising up along with total bilirubin.  Right quadrant ultrasound unremarkable.  Suspect hepatitis from alcohol use which should improve.  INR 1.4, PT 17.3, T bili 2.4. No indication for steroid use.   Closed left orbital fracture - Secondary to trauma he sustained about a month ago, follows with ophthalmology and has scheduled surgery on 05/22/2021.  His Eliquis is on hold  Macrocytosis - Suspect from alcohol use.  B12, TSH - WNL. Folate pending.   Paroxysmal A. fib - Eliquis currently on hold - Lopressor 25 mg twice daily  Wheelchair-bound due to generalized weakness - Supportive care.  Essential hypertension - Stable.  Norvasc 5 mg orally  daily     DVT prophylaxis: SCDs Start: 05/19/21 2108 Code Status: Full code Family Communication:  Called His brother and Jonny Ruiz- no answer.    The patient remains OBS appropriate and will d/c before 2 midnights.  Patient is still extremely weak and drowsy.  Requires IV fluids to help with his renal function and monitoring of his LFTs which is trending upwards for now.  Dispo: The patient is from: Home              Anticipated d/c is to: Home              Patient currently is not medically stable to d/c.   Difficult to place patient No       Subjective: Drowsy but carries on a conversation without any issues.  He is getting ready to work with physical therapy.  Tells me he recently he had quite a bit to drink.  Denies any abdominal pain or other complaints at this time.  I explained him that his eye surgery will need to be postponed until he is medically doing better  Review of Systems Otherwise negative except as per HPI, including: General: Denies fever, chills, night sweats or unintended weight loss. Resp: Denies cough, wheezing, shortness of breath. Cardiac: Denies chest pain, palpitations, orthopnea, paroxysmal nocturnal dyspnea. GI: Denies abdominal pain, nausea, vomiting, diarrhea or constipation GU: Denies dysuria, frequency, hesitancy or incontinence MS: Denies muscle aches, joint pain or swelling Neuro: Denies headache, neurologic deficits (focal weakness, numbness, tingling), abnormal gait Psych: Denies anxiety, depression, SI/HI/AVH Skin: Denies new  rashes or lesions ID: Denies sick contacts, exotic exposures, travel  Examination:  Constitutional: drowsy but answers appropriately. Swollen left eye- ~3 weeks now.  Respiratory: Clear to auscultation bilaterally Cardiovascular: Normal sinus rhythm, no rubs Abdomen: Nontender nondistended good bowel sounds Musculoskeletal: No edema noted Skin: No rashes seen Neurologic: CN 2-12 grossly intact.  And  nonfocal Psychiatric: Normal judgment and insight. Alert and oriented x 3. Normal mood.      Objective: Vitals:   05/21/21 0324 05/21/21 0414 05/21/21 0500 05/21/21 0800  BP:  124/74  (!) 105/52  Pulse:  83  82  Resp:    18  Temp:  99.4 F (37.4 C)  98.3 F (36.8 C)  TempSrc:  Oral  Axillary  SpO2: 94%   98%  Weight:   87.1 kg   Height:       No intake or output data in the 24 hours ending 05/21/21 1100  Filed Weights   05/19/21 1125 05/21/21 0500  Weight: 84.4 kg 87.1 kg     Data Reviewed:   CBC: Recent Labs  Lab 05/19/21 1157 05/20/21 0346 05/21/21 0313  WBC 9.1 7.6 7.4  NEUTROABS 7.8* 5.5  --   HGB 12.1* 10.7* 10.7*  HCT 36.7* 32.3* 33.6*  MCV 104.9* 105.6* 107.0*  PLT 106* 123* 104*   Basic Metabolic Panel: Recent Labs  Lab 05/19/21 1157 05/20/21 0346 05/21/21 0313  NA 139 139 140  K 3.5 4.0 3.6  CL 103 106 106  CO2 24 23 23   GLUCOSE 103* 80 71  BUN 16 15 10   CREATININE 1.33* 1.06 0.98  CALCIUM 8.2* 7.5* 7.7*  MG  --  1.4* 1.3*   GFR: Estimated Creatinine Clearance: 75.6 mL/min (by C-G formula based on SCr of 0.98 mg/dL). Liver Function Tests: Recent Labs  Lab 05/19/21 1157 05/20/21 0346 05/21/21 0313  AST 247* 331* 467*  ALT 85* 109* 160*  ALKPHOS 68 57 61  BILITOT 3.3* 2.6* 2.4*  PROT 5.9* 5.0* 5.1*  ALBUMIN 3.1* 2.6* 2.4*   No results for input(s): LIPASE, AMYLASE in the last 168 hours. Recent Labs  Lab 05/19/21 1952  AMMONIA 20   Coagulation Profile: Recent Labs  Lab 05/19/21 1737  INR 1.4*   Cardiac Enzymes: Recent Labs  Lab 05/21/21 0313  CKTOTAL 2,388*   BNP (last 3 results) No results for input(s): PROBNP in the last 8760 hours. HbA1C: No results for input(s): HGBA1C in the last 72 hours. CBG: No results for input(s): GLUCAP in the last 168 hours. Lipid Profile: No results for input(s): CHOL, HDL, LDLCALC, TRIG, CHOLHDL, LDLDIRECT in the last 72 hours. Thyroid Function Tests: Recent Labs     05/21/21 0313  TSH 1.145   Anemia Panel: Recent Labs    05/21/21 0313  VITAMINB12 995*   Sepsis Labs: No results for input(s): PROCALCITON, LATICACIDVEN in the last 168 hours.  Recent Results (from the past 240 hour(s))  Resp Panel by RT-PCR (Flu A&B, Covid) Nasopharyngeal Swab     Status: None   Collection Time: 05/19/21 11:57 AM   Specimen: Nasopharyngeal Swab; Nasopharyngeal(NP) swabs in vial transport medium  Result Value Ref Range Status   SARS Coronavirus 2 by RT PCR NEGATIVE NEGATIVE Final    Comment: (NOTE) SARS-CoV-2 target nucleic acids are NOT DETECTED.  The SARS-CoV-2 RNA is generally detectable in upper respiratory specimens during the acute phase of infection. The lowest concentration of SARS-CoV-2 viral copies this assay can detect is 138 copies/mL. A negative result does not preclude  SARS-Cov-2 infection and should not be used as the sole basis for treatment or other patient management decisions. A negative result may occur with  improper specimen collection/handling, submission of specimen other than nasopharyngeal swab, presence of viral mutation(s) within the areas targeted by this assay, and inadequate number of viral copies(<138 copies/mL). A negative result must be combined with clinical observations, patient history, and epidemiological information. The expected result is Negative.  Fact Sheet for Patients:  BloggerCourse.com  Fact Sheet for Healthcare Providers:  SeriousBroker.it  This test is no t yet approved or cleared by the [place] FDA and  has been authorized for detection and/or diagnosis of SARS-CoV-2 by FDA under an Emergency Use Authorization (EUA). This EUA will remain  in effect (meaning this test can be used) for the duration of the COVID-19 declaration under Section 564(b)(1) of the Act, 21 U.S.C.section 360bbb-3(b)(1), unless the authorization is terminated  or revoked sooner.        Influenza A by PCR NEGATIVE NEGATIVE Final   Influenza B by PCR NEGATIVE NEGATIVE Final    Comment: (NOTE) The Xpert Xpress SARS-CoV-2/FLU/RSV plus assay is intended as an aid in the diagnosis of influenza from Nasopharyngeal swab specimens and should not be used as a sole basis for treatment. Nasal washings and aspirates are unacceptable for Xpert Xpress SARS-CoV-2/FLU/RSV testing.  Fact Sheet for Patients: BloggerCourse.com  Fact Sheet for Healthcare Providers: SeriousBroker.it  This test is not yet approved or cleared by the [place] FDA and has been authorized for detection and/or diagnosis of SARS-CoV-2 by FDA under an Emergency Use Authorization (EUA). This EUA will remain in effect (meaning this test can be used) for the duration of the COVID-19 declaration under Section 564(b)(1) of the Act, 21 U.S.C. section 360bbb-3(b)(1), unless the authorization is terminated or revoked.  Performed at [facility], [address] [place], [place] [address]          Radiology Studies: CT Abdomen Pelvis W Contrast  Result Date: 05/19/2021 CLINICAL DATA:  Abdominal abscess/infection suspected EXAM: CT ABDOMEN AND PELVIS WITH CONTRAST TECHNIQUE: Multidetector CT imaging of the abdomen and pelvis was performed using the standard protocol following bolus administration of intravenous contrast. CONTRAST:  73mL OMNIPAQUE IOHEXOL 350 MG/ML SOLN COMPARISON:  None. FINDINGS: Lower chest: Lung bases are clear. Hepatobiliary: Low-attenuation liver consistent hepatic steatosis. Gallbladder normal. Pancreas: Pancreas is normal. No ductal dilatation. No pancreatic inflammation. Spleen: Normal spleen Adrenals/urinary tract: Adrenal glands normal. Nonobstructing calculus in the mid RIGHT kidney. Bilateral renal cortical scarring. Large benign cyst of the RIGHT kidney. Ureters and bladder normal. Stomach/Bowel: Stomach, small bowel,  appendix, and cecum are normal. The colon and rectosigmoid colon are normal. Vascular/Lymphatic: Abdominal aorta is normal caliber with atherosclerotic calcification. There is no retroperitoneal or periportal lymphadenopathy. No pelvic lymphadenopathy. Reproductive: Prostate unremarkable Other: No free fluid.  No abscess Musculoskeletal: Degenerative changes of the spine. No acute findings IMPRESSION: 1. No abscess or infection identified in the abdomen pelvis. 2. Hepatic steatosis. 3. No bowel obstruction or inflammation. Electronically Signed   By: Genevive Bi M.D.   On: 05/19/2021 18:58   DG Chest Port 1 View  Result Date: 05/19/2021 CLINICAL DATA:  Larey Seat this morning, weakness today, history hypertension EXAM: PORTABLE CHEST 1 VIEW COMPARISON:  Portable exam 1206 hours compared to 05/11/2021 FINDINGS: Rotated to the RIGHT. Enlargement of cardiac silhouette. Mediastinal contours and pulmonary vascularity normal. Atherosclerotic calcification aorta. Lungs clear. No acute infiltrate, pleural effusion, or pneumothorax. Advanced LEFT glenohumeral degenerative changes  and old posttraumatic deformity of proximal LEFT humerus. Osseous demineralization. IMPRESSION: Enlargement of cardiac silhouette. No acute abnormalities. Electronically Signed   By: Ulyses Southward M.D.   On: 05/19/2021 12:50   VAS [place] LOWER EXTREMITY VENOUS (DVT) (ONLY [facility] & [facility] 7a-7p)  Result Date: 05/19/2021  Lower Venous DVT Study Patient Name:  GREYSYN VANDERBERG  Date of Exam:   05/19/2021 Medical Rec #: [id number]    Accession #:    [id number] Date of Birth: Dec 25, 1950   Patient Gender: M Patient Age:   63 years Exam Location:  [facility] Procedure:      VAS [place] LOWER EXTREMITY VENOUS (DVT) Referring Phys: Mancel Bale --------------------------------------------------------------------------------  Indications: Edema.  Limitations: Body habitus, poor ultrasound/tissue interface and restricted mobility, patient position. Comparison Study:  07/02/2020 negative bilateral lower extremity venous duplex Performing Technologist: Gertie Fey MHA, RDMS, RVT, RDCS  Examination Guidelines: A complete evaluation includes B-mode imaging, spectral Doppler, color Doppler, and power Doppler as needed of all accessible portions of each vessel. Bilateral testing is considered an integral part of a complete examination. Limited examinations for reoccurring indications may be performed as noted. The reflux portion of the exam is performed with the patient in reverse Trendelenburg.  +---------+---------------+---------+-----------+----------+--------------+ RIGHT    CompressibilityPhasicitySpontaneityPropertiesThrombus Aging +---------+---------------+---------+-----------+----------+--------------+ CFV      Full           Yes      Yes                                 +---------+---------------+---------+-----------+----------+--------------+ SFJ      Full                                                        +---------+---------------+---------+-----------+----------+--------------+ FV Prox  Full                                                        +---------+---------------+---------+-----------+----------+--------------+ FV Mid   Full                                                        +---------+---------------+---------+-----------+----------+--------------+ FV DistalFull                                                        +---------+---------------+---------+-----------+----------+--------------+ PFV      Full                                                        +---------+---------------+---------+-----------+----------+--------------+ POP      Full           Yes  Yes                                 +---------+---------------+---------+-----------+----------+--------------+ PTV      Full                                                         +---------+---------------+---------+-----------+----------+--------------+ PERO     Full                                                        +---------+---------------+---------+-----------+----------+--------------+   +---------+---------------+---------+-----------+----------+--------------+ LEFT     CompressibilityPhasicitySpontaneityPropertiesThrombus Aging +---------+---------------+---------+-----------+----------+--------------+ CFV      Full           Yes      Yes                                 +---------+---------------+---------+-----------+----------+--------------+ SFJ      Full                                                        +---------+---------------+---------+-----------+----------+--------------+ FV Prox  Full                                                        +---------+---------------+---------+-----------+----------+--------------+ FV Mid   Full                                                        +---------+---------------+---------+-----------+----------+--------------+ FV DistalFull                                                        +---------+---------------+---------+-----------+----------+--------------+ PFV      Full                                                        +---------+---------------+---------+-----------+----------+--------------+ POP      Full           Yes      Yes                                 +---------+---------------+---------+-----------+----------+--------------+ PTV      Full                                                        +---------+---------------+---------+-----------+----------+--------------+  PERO     Full                                                        +---------+---------------+---------+-----------+----------+--------------+     Summary: RIGHT: - There is no evidence of deep vein thrombosis in the lower extremity.  - No cystic structure found in  the popliteal fossa.  LEFT: - There is no evidence of deep vein thrombosis in the lower extremity.  - No cystic structure found in the popliteal fossa.  *See table(s) above for measurements and observations. Electronically signed by Waverly Ferrari MD on 05/19/2021 at 3:44:07 PM.    Final    US Abdomen Limited RUQ (LIVER/GB)  Result Date: 05/19/2021 CLINICAL DATA:  Elevated liver function tests EXAM: ULTRASOUND ABDOMEN LIMITED RIGHT UPPER QUADRANT COMPARISON:  CT 05/19/2021 FINDINGS: Gallbladder: No gallstones or wall thickening visualized. No sonographic Murphy sign noted by sonographer. Common bile duct: Diameter: 5 mm in proximal diameter Liver: The hepatic parenchymal echogenicity is diffusely increased and there is coarsening of the hepatic echotexture and poor acoustic through transmission in keeping with changes of moderate hepatic steatosis. No focal intrahepatic masses are seen. There is no intrahepatic biliary ductal dilation. Portal vein is patent on color Doppler imaging with normal direction of blood flow towards the liver. Other: 6.7 cm simple cyst noted within the visualized right kidney. IMPRESSION: Moderate hepatic steatosis. Electronically Signed   By: Helyn Numbers M.D.   On: 05/19/2021 20:34        Scheduled Meds:  allopurinol  300 mg Oral QHS   amLODipine  5 mg Oral Daily   atorvastatin  40 mg Oral QHS   folic acid  1 mg Oral Daily   metoprolol tartrate  25 mg Oral BID   multivitamin with minerals  1 tablet Oral Daily   oxybutynin  5 mg Oral QHS   tamsulosin  0.4 mg Oral QHS   thiamine  100 mg Oral Daily   Continuous Infusions:  lactated ringers 125 mL/hr at 05/21/21 1042   magnesium sulfate bolus IVPB 4 g (05/21/21 1046)     LOS: 1 day   Time spent= 35 mins    Carlye Panameno Joline Maxcy, MD [facility]  If 7PM-7AM, please contact night-coverage  05/21/2021, 11:00 AM

## 2021-05-21 NOTE — Evaluation (Signed)
Occupational Therapy Evaluation Patient Details Name: Taylor Myers MRN: [id number] DOB: 1950-11-11 Today's Date: 05/21/2021   History of Present Illness 70 year old white male presented to the ER 05/19/21 after falling out of his wheelchair. Recent incr consumption ETOH with dehydration and AKI; CIWA initiated   PMH- atrial fibrillation on chronic anticoagulation, hypertension, wheelchair-bound status due to weakness, recent orbital fx with surgery pending 9/30   Clinical Impression   PTA patient was living with his 16 y.o. father and was grossly Mod I with ADLs/IADLs with use of DME/AD. Hx of falls with recent admission. Patient with suborbital fx with surgical intervention initially scheduled for 9/30. Patient currently functioning below baseline demonstrating observed ADLs including LB dressing with Mod to Max A. Patient also limited by deficits listed below including decreased balance, decreased activity tolerance and decreased cognition and would benefit from continued acute OT services in prep for safe d/c to next level of care pending patient progress. OT will continue to follow acutely.        Recommendations for follow up therapy are one component of a multi-disciplinary discharge planning process, led by the attending physician.  Recommendations may be updated based on patient status, additional functional criteria and insurance authorization.   Follow Up Recommendations  SNF;Supervision/Assistance - 24 hour;Home health OT    Equipment Recommendations  3 in 1 bedside commode    Recommendations for Other Services       Precautions / Restrictions Precautions Precautions: Fall Precaution Comments: recent fall (?falls) during transfers Restrictions Weight Bearing Restrictions: No      Mobility Bed Mobility Overal bed mobility: Needs Assistance Bed Mobility: Supine to Sit;Sit to Supine     Supine to sit: Min assist Sit to supine: Mod assist   General bed mobility  comments: assist to initiate legs toward EOB, assist to raise torso to fully upright; pt able to raise legs up onto bed but very crosswise in bed and mod assist with max cues to center himself in bed    Transfers Overall transfer level: Needs assistance Equipment used:  (holding handles on back of recliner) Transfers: Sit to/from Stand Sit to Stand: Min assist;+2 physical assistance;+2 safety/equipment         General transfer comment: used handles on recliner to simulate grab bars that pt reports he uses at home to stand    Balance Overall balance assessment: Needs assistance Sitting-balance support: No upper extremity supported;Feet supported Sitting balance-Leahy Scale: Fair     Standing balance support: Bilateral upper extremity supported Standing balance-Leahy Scale: Poor                             ADL either performed or assessed with clinical judgement   ADL Overall ADL's : Needs assistance/impaired     Grooming: Minimal assistance;Bed level   Upper Body Bathing: Minimal assistance;Sitting   Lower Body Bathing: Maximal assistance;Sit to/from stand   Upper Body Dressing : Minimal assistance;Sitting Upper Body Dressing Details (indicate cue type and reason): Min A to doff/don anterior hospital gown seated EOB. Lower Body Dressing: Maximal assistance Lower Body Dressing Details (indicate cue type and reason): Max A to don footwear seated EOB. Toilet Transfer: Maximal Cabin crew Details (indicate cue type and reason): Simulated with lateral steps toward HOB holding onto back of recliner.                 Vision Baseline Vision/History: 1 Wears glasses (Readers) Ability to See in Adequate  Light: 1 Impaired (R suborbital fx. 2/2 fall; surgery initially scheduled for 9/30.) Patient Visual Report: Blurring of vision Vision Assessment?: Vision impaired- to be further tested in functional context Additional Comments: Patient  very lethargic at time of evaluation; unable to formally assess vision.     Perception     Praxis      Pertinent Vitals/Pain Pain Assessment: Faces Faces Pain Scale: Hurts a little bit Pain Location: "all over" Pain Descriptors / Indicators: Aching Pain Intervention(s): Limited activity within patient's tolerance;Monitored during session;Repositioned;Patient requesting pain meds-RN notified     Hand Dominance Right   Extremity/Trunk Assessment Upper Extremity Assessment Upper Extremity Assessment: RUE deficits/detail;LUE deficits/detail RUE Deficits / Details: Limited AROM at shoulder joint; Hx of OA; generalized weakness RUE Coordination: decreased fine motor LUE Deficits / Details: Limited AROM at shoulder joint; Hx of OA; generalized weakness LUE Coordination: decreased fine motor   Lower Extremity Assessment Lower Extremity Assessment: Defer to PT evaluation   Cervical / Trunk Assessment Cervical / Trunk Assessment: Kyphotic   Communication Communication Communication: Other (comment) (?confabulating)   Cognition Arousal/Alertness: Lethargic;Suspect due to medications (CIWA protocol) Behavior During Therapy: Flat affect Overall Cognitive Status: Impaired/Different from baseline Area of Impairment: Orientation;Attention;Following commands;Awareness;Problem solving                 Orientation Level: Situation (provides differing history than H&P for reason for admission (?accurate)) Current Attention Level: Sustained   Following Commands: Follows one step commands with increased time   Awareness: Intellectual Problem Solving: Slow processing;Decreased initiation;Difficulty sequencing;Requires verbal cues General Comments: pt reporting he thinks he hallucinated that he saw his brother (who lives in [place]) wearing a wig; tangential talk about rolling in his wheelchair up/down the street at 2 a.m. with cane and samurai sword   General Comments        Exercises     Shoulder Instructions      Home Living Family/patient expects to be discharged to:: Private residence Living Arrangements: Parent (Father, 48 yo) Available Help at Discharge: Personal care attendant;Available PRN/intermittently (2-3 hrs/day, 2-3 days/week (assists both patient and patient's father))                         Home Equipment: Wheelchair - manual   Additional Comments: pt very groggy and tangential; unable to get accurate home set-up      Prior Functioning/Environment Level of Independence: Needs assistance  Gait / Transfers Assistance Needed: reports independently transfers to wheelchair (sometimes scooting, sometimes standing with grab bar), self-propels; reports went to jazz concert earlier this week (?accuracy); takes a taxi to get his hair cut ADL's / Homemaking Assistance Needed: aide assists with homemaking and assists father with ADLs; patient manages his own medication.            OT Problem List: Decreased strength;Decreased range of motion;Decreased activity tolerance;Impaired balance (sitting and/or standing);Decreased coordination;Decreased cognition;Decreased safety awareness;Decreased knowledge of use of DME or AE;Cardiopulmonary status limiting activity;Impaired UE functional use;Pain      OT Treatment/Interventions: Self-care/ADL training;Therapeutic exercise;Energy conservation;DME and/or AE instruction;Therapeutic activities;Cognitive remediation/compensation;Visual/perceptual remediation/compensation;Patient/family education;Balance training    OT Goals(Current goals can be found in the care plan section) Acute Rehab OT Goals Patient Stated Goal: unable to state; agrees to therapy OT Goal Formulation: With patient Time For Goal Achievement: 06/04/21 Potential to Achieve Goals: Good ADL Goals Pt Will Perform Eating: Independently Pt Will Perform Grooming: Independently;sitting (wc level at sink) Pt Will Perform Upper Body  Dressing: Independently;sitting (wc  level) Pt Will Perform Lower Body Dressing: with modified independence;sitting/lateral leans Pt Will Transfer to Toilet: with modified independence;stand pivot transfer Pt Will Perform Toileting - Clothing Manipulation and hygiene: with modified independence;sitting/lateral leans Pt Will Perform Tub/Shower Transfer: Tub transfer;Shower transfer;tub bench;grab bars;rolling walker Additional ADL Goal #1: Patient will score <4/28 on SBT indicating improved congition in prep for ADLs.  OT Frequency: Min 2X/week   Barriers to D/C: Decreased caregiver support  Lives with 12 y.o. father; limited assist from PCA.       Co-evaluation              AM-PAC OT "6 Clicks" Daily Activity     Outcome Measure Help from another person eating meals?: A Little Help from another person taking care of personal grooming?: A Little Help from another person toileting, which includes using toliet, bedpan, or urinal?: A Lot Help from another person bathing (including washing, rinsing, drying)?: A Lot Help from another person to put on and taking off regular upper body clothing?: A Little Help from another person to put on and taking off regular lower body clothing?: A Lot 6 Click Score: 15   End of Session Equipment Utilized During Treatment: Gait belt;Oxygen Nurse Communication: Mobility status  Activity Tolerance: Patient limited by lethargy;Patient tolerated treatment well Patient left: in bed;with call bell/phone within reach;with bed alarm set  OT Visit Diagnosis: Unsteadiness on feet (R26.81);Other abnormalities of gait and mobility (R26.89);Repeated falls (R29.6);Muscle weakness (generalized) (M62.81);Other symptoms and signs involving cognitive function;Pain                Time: 9767-3419 OT Time Calculation (min): 33 min Charges:  OT General Charges $OT Visit: 1 Visit OT Evaluation $OT Eval Moderate Complexity: 1 Mod  Cottrell Gentles H. OTR/L Supplemental OT,  Department of rehab services [phone]  Hamzeh Tall R H. 05/21/2021, 11:11 AM

## 2021-05-22 DIAGNOSIS — F4310 Post-traumatic stress disorder, unspecified: Secondary | ICD-10-CM

## 2021-05-22 DIAGNOSIS — F1020 Alcohol dependence, uncomplicated: Secondary | ICD-10-CM

## 2021-05-22 LAB — FOLATE RBC
Folate, Hemolysate: 302 ng/mL
Folate, RBC: 953 ng/mL (ref >498)
Hematocrit: 31.7 % — ABNORMAL LOW (ref 37.5–51.0)

## 2021-05-22 LAB — CBC
HCT: 32.9 % — ABNORMAL LOW (ref 39.0–52.0)
Hemoglobin: 10.6 g/dL — ABNORMAL LOW (ref 13.0–17.0)
MCH: 34.2 pg — ABNORMAL HIGH (ref 26.0–34.0)
MCHC: 32.2 g/dL (ref 30.0–36.0)
MCV: 106.1 fL — ABNORMAL HIGH (ref 80.0–100.0)
Platelets: 116 10*3/uL — ABNORMAL LOW (ref 150–400)
RBC: 3.1 MIL/uL — ABNORMAL LOW (ref 4.22–5.81)
RDW: 12.8 % (ref 11.5–15.5)
WBC: 5.6 10*3/uL (ref 4.0–10.5)
nRBC: 0 % (ref 0.0–0.2)

## 2021-05-22 LAB — COMPREHENSIVE METABOLIC PANEL
ALT: 132 U/L — ABNORMAL HIGH (ref 0–44)
AST: 267 U/L — ABNORMAL HIGH (ref 15–41)
Albumin: 2.3 g/dL — ABNORMAL LOW (ref 3.5–5.0)
Alkaline Phosphatase: 73 U/L (ref 38–126)
Anion gap: 6 (ref 5–15)
BUN: 9 mg/dL (ref 8–23)
CO2: 28 mmol/L (ref 22–32)
Calcium: 7.7 mg/dL — ABNORMAL LOW (ref 8.9–10.3)
Chloride: 105 mmol/L (ref 98–111)
Creatinine, Ser: 0.85 mg/dL (ref 0.61–1.24)
GFR, Estimated: >60 mL/min (ref >60)
Glucose, Bld: 142 mg/dL — ABNORMAL HIGH (ref 70–99)
Potassium: 3.6 mmol/L (ref 3.5–5.1)
Sodium: 139 mmol/L (ref 135–145)
Total Bilirubin: 1.2 mg/dL (ref 0.3–1.2)
Total Protein: 4.7 g/dL — ABNORMAL LOW (ref 6.5–8.1)

## 2021-05-22 LAB — CK: Total CK: 1840 U/L — ABNORMAL HIGH (ref 49–397)

## 2021-05-22 LAB — MAGNESIUM: Magnesium: 1.9 mg/dL (ref 1.7–2.4)

## 2021-05-22 MED ORDER — MAGNESIUM OXIDE -MG SUPPLEMENT 400 (240 MG) MG PO TABS
800.0000 mg | ORAL_TABLET | Freq: Once | ORAL | Status: AC
  Administered 2021-05-22: 800 mg via ORAL
  Filled 2021-05-22: qty 2

## 2021-05-22 MED ORDER — POTASSIUM CHLORIDE CRYS ER 20 MEQ PO TBCR
40.0000 meq | EXTENDED_RELEASE_TABLET | Freq: Once | ORAL | Status: AC
  Administered 2021-05-22: 40 meq via ORAL
  Filled 2021-05-22: qty 2

## 2021-05-22 NOTE — Consult Note (Signed)
Reason for Consult: EtOH detox Referring Physician: Dimple Nanas, MD  Taylor Myers is a 70 y.o. male admitted medically for 05/19/2021 11:15 AM for . He carries the psychiatric diagnoses of PTSD and EtOH use disorder and has a past medical history of  atrial fibrillation on chronic anticoagulation, hypertension, wheelchair-bound status .Psychiatry was consulted for EtOH Detox.   He does not meet criteria for involuntary inpatient psych admission and declined both inpt psych and rehab.  On initial examination, patient was lying in bed, pleasant and answered all questions.    When asked why he is here he states because he had a "nervous breakdown" and while he was drinking had hallucinations.  He reports that he fell down and could not get up and that his 21 yr old father is making too much of a big deal out of this.  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  He reports that recently he has been drinking 1 beer a day and he has been over indulging the last couple of days.  He reports having slurred words because of Trigeminal Nerve decompression. He reports that his father not calling 911 when he fell was really hurtful.  He states that since his mother died they having been butting heads.  He states he was previously married, has no kids.  He reports that after 9/11 he saw a therapist but not since.  He reports that he has never seen a psychiatrist and never been on any medications.  He reports that he does have guns at his house but that they are locked up.  He reports that he is not depressed this is all related to 9/11 anniversary.  He reports that he has an irregular sleep schedule but that's just him.  He reports no manic symptoms  He reports hallucinations 10 years ago and after that stopped drinking for about 10 years until recently he started drinking again.  He reports not drinking  every day only 3-4 days a week for about a year.  Reports never having any cravings for drinking.  He reports no SI or HI.   He reports for fun he sits in his wheelchair with a cane and dances like [employer].  He is alert and oriented to name, place and time.  He is able to do DOWB.  Is able to name months backwards except skipped March.  He reports no interest in going to Rehab.  He reports that someone gave him info for AA meetings and is considering going but states no problems. Discussed APS would most likely come over and he reports no issues with this.  When asked again if his guns were locked up he confirms this (see other concerns below).  He reports that if he had thoughts about hurting himself he would tell his nurse here and once discharged would call his friend who is also a Engineer, civil (consulting).  Friend is Taylor Myers [phone] and he gave [place] consent to call her if needed. He also later gave [place] permission to speak with his brother.   Called Denise for collateral. She reports that she thinks his brother might know more information because he talks to him more often. She reports that he went off the rails after 9/11 because she knew he was drinking but did not know how much. She states he continued to ruminate on all of  his academy buddies died on 9/11. She reports that she and her husband never bought him alcohol but she states he started going out on his own to get drinks. She states that he would report that he only "had a couple." She reports he never made any threats of self harm but she states she knew he was depressed and continually asked him to seek help. She stressed the importance of talking with his brother to get a better idea of what's going on since the brother talks with their father often. She states that the father would know what has been going on because he lives with the patient.     Called patient's brother Taylor Myers [phone]. He reports that he is still in  [place]. Discussed that Medical team had filled an APS report to look out for his father. He reports that his brother has "gone through this before" about 2 years ago where he was drinking a lot and taking pain medication. He reports that the father has reported finding empty vodka bottles in his room. He reports that he has talked with his brother and he seems to have a lot of anger towards his father. Discussed that we think the patient would benefit from AA and therapy. Discussed that patient was interested in AA but not in any inpatient stay. Brother states that patient keeps loaded guns in the house often on his wheel chair.  He reports that his father has started the patient before and that he is concerned about an accidental shooting.  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  He asked if a Child psychotherapist was going to call him and discussed that we do not know that answer. Discussed we would have a Social Work reach out to him.    Past Medical History:  Diagnosis Date   Arthritis    Hypertension     Past Surgical History:  Procedure Laterality Date   ANKLE SURGERY     HIP SURGERY     KNEE SURGERY      History reviewed. No pertinent family history.  Social History:  reports that he has quit smoking. His smoking use included cigarettes. He has never used smokeless tobacco. He reports current alcohol use of about 2.0 standard drinks per week. He reports that he does not currently use drugs.  Allergies: No Known Allergies  Medications: I have reviewed the patient's current medications. Prior to Admission:  No medications prior to admission.    Results for orders placed or performed during the hospital encounter of 05/19/21 (from the past 48 hour(s))  Comprehensive metabolic panel     Status: Abnormal   Collection  Time: 05/21/21  3:13 AM  Result Value Ref Range   Sodium 140 135 - 145 mmol/L   Potassium 3.6 3.5 - 5.1 mmol/L   Chloride 106 98 - 111 mmol/L   CO2 23 22 - 32 mmol/L   Glucose, Bld 71 70 - 99 mg/dL    Comment: Glucose reference range applies only to samples taken after fasting for at least 8 hours.   BUN 10 8 - 23 mg/dL   Creatinine, Ser 4.85 0.61 - 1.24 mg/dL   Calcium 7.7 (L) 8.9 - 10.3 mg/dL   Total Protein 5.1 (L) 6.5 - 8.1 g/dL   Albumin 2.4 (L)  3.5 - 5.0 g/dL   AST 893 (H) 15 - 41 U/L   ALT 160 (H) 0 - 44 U/L   Alkaline Phosphatase 61 38 - 126 U/L   Total Bilirubin 2.4 (H) 0.3 - 1.2 mg/dL   GFR, Estimated >81 >01 mL/min    Comment: (NOTE) Calculated using the CKD-EPI Creatinine Equation (2021)    Anion gap 11 5 - 15    Comment: Performed at [facility], [address] [place], [place] [address]  CBC     Status: Abnormal   Collection Time: 05/21/21  3:13 AM  Result Value Ref Range   WBC 7.4 4.0 - 10.5 K/uL   RBC 3.14 (L) 4.22 - 5.81 MIL/uL   Hemoglobin 10.7 (L) 13.0 - 17.0 g/dL   HCT 58.5 (L) 27.7 - 82.4 %   MCV 107.0 (H) 80.0 - 100.0 fL   MCH 34.1 (H) 26.0 - 34.0 pg   MCHC 31.8 30.0 - 36.0 g/dL   RDW 23.5 36.1 - 44.3 %   Platelets 104 (L) 150 - 400 K/uL    Comment: Immature Platelet Fraction may be clinically indicated, consider ordering this additional test [id number] REPEATED TO VERIFY PLATELET COUNT CONFIRMED BY SMEAR    nRBC 0.0 0.0 - 0.2 %    Comment: Performed at [facility], [address], [place], [place] [address]  Magnesium     Status: Abnormal   Collection Time: 05/21/21  3:13 AM  Result Value Ref Range   Magnesium 1.3 (L) 1.7 - 2.4 mg/dL    Comment: Performed at [facility], [address] [place], [place] [address]  Folate RBC     Status: Abnormal   Collection Time: 05/21/21  3:13 AM  Result Value Ref Range   Folate, Hemolysate 302.0 Not Estab. ng/mL   Hematocrit 31.7 (L) 37.5 - 51.0 %   Folate, RBC 953 >498 ng/mL     Comment: (NOTE) Performed At: [facility] [address] [place], [place] [id number] Jolene Schimke MD PJ:[id number]   Vitamin B12     Status: Abnormal   Collection Time: 05/21/21  3:13 AM  Result Value Ref Range   Vitamin B-12 995 (H) 180 - 914 pg/mL    Comment: (NOTE) This assay is not validated for testing neonatal or myeloproliferative syndrome specimens for Vitamin B12 levels. Performed at [facility], [address] [place], [place] [address]   CK     Status: Abnormal   Collection Time: 05/21/21  3:13 AM  Result Value Ref Range   Total CK 2,388 (H) 49 - 397 U/L    Comment: Performed at [facility], [address] [place], [place] [address]  TSH     Status: None   Collection Time: 05/21/21  3:13 AM  Result Value Ref Range   TSH 1.145 0.350 - 4.500 uIU/mL    Comment: Performed by a 3rd Generation assay with a functional sensitivity of <=0.01 uIU/mL. Performed at [facility], [address] [place], [place] [address]   Ammonia     Status: None   Collection Time: 05/21/21 11:13 AM  Result Value Ref Range   Ammonia 22 9 - 35 umol/L    Comment: Performed at [facility], [address] [place], [place] [address]  Ammonia     Status: None   Collection Time: 05/21/21  2:19 PM  Result Value Ref Range   Ammonia 23 9 - 35 umol/L  Comment: Performed at [facility], [address] [place], [place] [address]  Comprehensive metabolic panel     Status: Abnormal   Collection Time: 05/22/21  1:25 AM  Result Value Ref Range   Sodium 139 135 - 145 mmol/L   Potassium 3.6 3.5 - 5.1 mmol/L   Chloride 105 98 - 111 mmol/L   CO2 28 22 - 32 mmol/L   Glucose, Bld 142 (H) 70 - 99 mg/dL    Comment: Glucose reference range applies only to samples taken after fasting for at least 8 hours.   BUN 9 8 - 23 mg/dL   Creatinine, Ser 6.83 0.61 - 1.24 mg/dL   Calcium 7.7 (L) 8.9 - 10.3 mg/dL   Total Protein 4.7 (L) 6.5 - 8.1 g/dL   Albumin 2.3 (L) 3.5  - 5.0 g/dL   AST 419 (H) 15 - 41 U/L   ALT 132 (H) 0 - 44 U/L   Alkaline Phosphatase 73 38 - 126 U/L   Total Bilirubin 1.2 0.3 - 1.2 mg/dL   GFR, Estimated >62 >22 mL/min    Comment: (NOTE) Calculated using the CKD-EPI Creatinine Equation (2021)    Anion gap 6 5 - 15    Comment: Performed at [facility], [address], [place], [place] [address]  CBC     Status: Abnormal   Collection Time: 05/22/21  1:25 AM  Result Value Ref Range   WBC 5.6 4.0 - 10.5 K/uL   RBC 3.10 (L) 4.22 - 5.81 MIL/uL   Hemoglobin 10.6 (L) 13.0 - 17.0 g/dL   HCT 21.1 (L) 94.1 - 74.0 %   MCV 106.1 (H) 80.0 - 100.0 fL   MCH 34.2 (H) 26.0 - 34.0 pg   MCHC 32.2 30.0 - 36.0 g/dL   RDW 81.4 48.1 - 85.6 %   Platelets 116 (L) 150 - 400 K/uL    Comment: Immature Platelet Fraction may be clinically indicated, consider ordering this additional test [id number] CONSISTENT WITH PREVIOUS RESULT REPEATED TO VERIFY    nRBC 0.0 0.0 - 0.2 %    Comment: Performed at [facility], [address] [place], [place] [address]  Magnesium     Status: None   Collection Time: 05/22/21  1:25 AM  Result Value Ref Range   Magnesium 1.9 1.7 - 2.4 mg/dL    Comment: Performed at [facility], [address] [place], [place] [address]  CK     Status: Abnormal   Collection Time: 05/22/21  1:25 AM  Result Value Ref Range   Total CK 1,840 (H) 49 - 397 U/L    Comment: Performed at [facility], [address], [place], [place] [address]    No results found.  Review of Systems  Respiratory:  Negative for shortness of breath.   Cardiovascular:  Negative for chest pain.  Gastrointestinal:  Negative for abdominal pain, constipation, diarrhea, nausea and vomiting.  Neurological:  Negative for headaches.  Psychiatric/Behavioral:  Negative for suicidal ideas.   Blood pressure 109/67, pulse 77, temperature 98.1 F (36.7 C), temperature source Oral, resp. rate 20, height 5\' 9"  (1.753 m), weight 88.3 kg, SpO2 97  %. Physical Exam Vitals and nursing note reviewed.  Constitutional:      Appearance: Normal appearance. He is normal weight.  HENT:     Head: Normocephalic and atraumatic.  Pulmonary:     Effort: Pulmonary effort is normal.  Neurological:     General: No focal  deficit present.     Mental Status: He is alert.     05/22/21 1421  Presentation  General Appearance Disheveled;Casual  Eye Contact Good  Speech Clear and Coherent;Normal Rate  Speech Volume Normal  Mood and Affect  Mood Euthymic  Affect  (congruent but some guarding)  Thought Processes  Thought Process Coherent  Descriptions of Associations Intact  Orientation Full (Time, Place and Person)  Thought Content Logical  Hallucinations None  Ideas of Reference None  Suicidal Thoughts No  Homicidal Thoughts No  Sensorium  Memory Immediate Fair;Recent Fair  Judgment Poor  Insight Poor  Executive Functions  Concentration Good  Attention Span Good  Recall Good  Fund of Knowledge Good  Language Good  Psychomotor Activity  Psychomotor Activity Normal  Assets  Assets Resilience;Housing  Sleep  Sleep Good     Assessment/Plan: Jameil Whitmoyer is a 70 y.o. male admitted medically for 05/19/2021 11:15 AM for . He carries the psychiatric diagnoses of PTSD and EtOH use disorder and has a past medical history of  atrial fibrillation on chronic anticoagulation, hypertension, wheelchair-bound status .Psychiatry was consulted for EtOH Detox.   He does not meet criteria for involuntary inpatient psych admission; declined resources offered (did take outpt referrals from SW).  On initial examination, patient was lying in bed, pleasant and answered all questions.    At this time patient is not interested in Detox or Rehab programs.  He is not currently a threat to himself or others so does not meet criteria for IVC. We do support APS referral for pt's father based on brother's collateral above. We will not start any medications at this  time.  As he does not want to talk to psychiatry any more we will sign off at this time. We have discussed these recommendations with the primary care team.    Continue rest of care per Primary Team Psychiatry will sign off at this time, please reconsult if any questions arise   Taylor Myers 05/22/2021, 11:44 PM    Dr. Renaldo Fiddler functioned as my scribe for this note and placed orders; I was present for and conducted the entirety of the interview and formulated treatment plan.

## 2021-05-22 NOTE — TOC Transition Note (Signed)
Transition of Care [facility]) - CM/SW Discharge Note   Patient Details  Name: Payne Garske MRN: [id number] Date of Birth: 07-Sep-1950  Transition of Care [facility]) CM/SW Contact:  Lockie Pares, RN Phone Number: 05/22/2021, 3:32 PM   Clinical Narratiive Patient will  have [facility] home health Is cleared by Psychiatric services. Wants to return home.  Has transportation home Brother has concerns of him going home but he is alert and oriented  and able to make secisions   Final next level of care: Home w Home Health Services Barriers to Discharge: No Barriers Identified   Patient Goals and CMS Choice Patient states their goals for this hospitalization and ongoing recovery are:: Rehab CMS Medicare.gov Compare Post Acute Care list provided to:: Patient Choice offered to / list presented to : Patient  Discharge Placement                       Discharge Plan and Services In-house Referral: Clinical Social Work Discharge Planning Services: CM Consult Post Acute Care Choice: Home Health                    HH Arranged: RN, PT, OT, Social Work [employer] Agency: [employer] Home Health Services Date [facility] Agency Contacted: 05/22/21 Time HH Agency Contacted: [id number] Representative spoke with at [facility] Agency: Becky Sax  Social Determinants of Health (SDOH) Interventions     Readmission Risk Interventions No flowsheet data found.

## 2021-05-22 NOTE — Discharge Summary (Signed)
Physician Discharge Summary  Taylor Myers WVP:[id number] DOB: 1950/10/16 DOA: 05/19/2021  PCP: Taylor Garbe, MD  Admit date: 05/19/2021 Discharge date: 05/22/2021  Admitted From: Home Disposition: Home  Recommendations for Outpatient Follow-up:  Follow up with PCP in 1-2 weeks Repeat CMP with PCP in next 1-2 weeks Resources for outpatient alcohol use rehab given Advised to hold atorvastatin until LFTs have normalized Counseled to quit using alcohol   Discharge Condition: Stable CODE STATUS: Full code Diet recommendation: Regular  Brief/Interim Summary: 70 year old with history of A. fib on Eliquis, HTN, wheelchair-bound due to weakness fell out of wheelchair last month and went to [facility] for evaluation of orbital fracture.  He is scheduled for surgery on 05/22/2021.  In the meantime patient has been progressively feeling weak and continues to drink every day.  His brother brought him to the hospital for further evaluation.  He was found to be in acute kidney injury with transaminitis.  He was admitted to the hospital for further evaluation.  Upon admission patient was noted to have acute kidney injury, rhabdomyolysis, transaminitis and confusion likely from alcohol use.  He was started on IV fluids and over the course of 24-48 hours his renal function improved, CK levels trended down along with LFTs.  Right upper quadrant ultrasound was negative for any acute pathology but did show hepatic steatosis.  During initial PT evaluation and SNF was recommended but patient was quite drowsy at that time because he was receiving periodic Ativan for alcohol withdrawal.  Eventually he was no longer withdrawing from alcohol and requiring minimal to no Ativan.  On the day of his discharge his mentation was back to baseline, he was alert awake oriented X4.  He was also evaluated by psychiatry and offered detox program but patient refused and wanted to go outpatient.  He was cleared by  their service as well.  I had extensive discussion with the patient's brother and father during the hospital stay and even prior to discharge.  Brother initially mentioned that there was concerns of safety at home due to weapons and that he lives in [place] and patient lives with his father.  I spoke with the father who is alert awake oriented X4 and tells me that he feels overall safe at home but is only concerned about Taylor Myers that he will start drinking again.  I explained them that patient is alert awake oriented X4 and has capacity to make his decisions therefore I am not able to make any decisions against his wishes.  Otherwise patient is medically stable to be discharged.     Assessment & Plan:   Principal Problem:   AKI (acute kidney injury) (HCC) Active Problems:   Essential hypertension   Long term (current) use of anticoagulants   Paroxysmal atrial fibrillation (HCC)   Elevated LFTs   Wheelchair bound   Closed fracture of left orbital floor (HCC)   Acute kidney injury, resolved History of BPH - Baseline creatinine 0.7.  Admission creatinine 1.33 which is resolved with IV fluids..  Avoid nephrotoxic agents.  CT abdomen pelvis is negative for any acute obstruction - Continue Flomax and Ditropan   Rhabdomyolysis - CK 2388 > 1840.  Improved, advised oral hydration at home  Transaminitis, improving - LFTs have alcohol pattern suggestive of mild alcoholic hepatitis.  He does not qualify for steroid use.  Right upper quadrant ultrasound is negative for acute pathology besides alcohol steatosis.  I advised him to continue hold his atorvastatin until his  outpatient lab work has been repeated within the next 1-2 weeks.  Closed left orbital fracture - Currently his Eliquis is on hold.  He follows outpatient ophthalmology who had scheduled him for surgery on 9/30.  I requested him to call his ophthalmologist and reschedule this.  If this is going to take several days, he will need to  resume his Eliquis otherwise continue to hold at their discretion   Macrocytosis - Suspect from alcohol use.  B12, TSH - WNL.   Paroxysmal A. fib - Eliquis currently on hold, but needs to speak with his outpatient ophthalmology and resume if his surgery is going to be postponed for a prolonged period time - Lopressor 25 mg twice daily  Wheelchair-bound due to generalized weakness - Supportive care.  Essential hypertension - Stable.  Norvasc 5 mg orally daily        Body mass index is 28.75 kg/m.  Pressure Injury 05/20/21 Buttocks Right Deep Tissue Pressure Injury - Purple or maroon localized area of discolored intact skin or blood-filled blister due to damage of underlying soft tissue from pressure and/or shear. (Active)  05/20/21 2200  Location: Buttocks  Location Orientation: Right  Staging: Deep Tissue Pressure Injury - Purple or maroon localized area of discolored intact skin or blood-filled blister due to damage of underlying soft tissue from pressure and/or shear.  Wound Description (Comments):   Present on Admission: Yes        Discharge Diagnoses:  Principal Problem:   AKI (acute kidney injury) (HCC) Active Problems:   Essential hypertension   Long term (current) use of anticoagulants   Paroxysmal atrial fibrillation (HCC)   Elevated LFTs   Wheelchair bound   Closed fracture of left orbital floor (HCC)   Malnutrition of moderate degree      Consultations: Psychiatry  Subjective: Patient was seen and examined at bedside this morning, he does not have any complaints and is adamant about going home.  He is alert awake oriented X4 and has capacity to make his decisions.  He understands why he was in the hospital.  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  He understands that him and his father who live at home and there is no safety concern according to him and also his father.  He does not wish to  seek inpatient detox and would like to be discharged.  Discharge Exam: Vitals:   05/22/21 0800 05/22/21 1307  BP:  109/67  Pulse: 74 77  Resp: 19 20  Temp:  98.1 F (36.7 C)  SpO2: 97%    Vitals:   05/22/21 0627 05/22/21 0753 05/22/21 0800 05/22/21 1307  BP:  117/72  109/67  Pulse:  69 74 77  Resp:  17 19 20   Temp:  97.8 F (36.6 C)  98.1 F (36.7 C)  TempSrc:  Oral  Oral  SpO2:  92% 97%   Weight: 88.3 kg     Height:        General: Pt is alert, awake, not in acute distress Cardiovascular: RRR, S1/S2 +, no rubs, no gallops Respiratory: CTA bilaterally, no wheezing, no rhonchi Abdominal: Soft, NT, ND, bowel sounds + Extremities: no edema, no cyanosis  Discharge Instructions   Allergies as of 05/22/2021   No Known Allergies      Medication List     STOP taking these medications    valACYclovir 1000 MG tablet Commonly known as: VALTREX  TAKE these medications    allopurinol 300 MG tablet Commonly known as: ZYLOPRIM Take 300 mg by mouth daily.   amLODipine 5 MG tablet Commonly known as: NORVASC Take 5 mg by mouth daily.   atorvastatin 40 MG tablet Commonly known as: LIPITOR Take 40 mg by mouth daily.   Eliquis 5 MG Tabs tablet Generic drug: apixaban Take 5 mg by mouth 2 (two) times daily.   metoprolol succinate 25 MG 24 hr tablet Commonly known as: TOPROL-XL Take 25 mg by mouth daily.   oxybutynin 5 MG tablet Commonly known as: DITROPAN Take 5 mg by mouth daily.   venlafaxine XR 150 MG 24 hr capsule Commonly known as: EFFEXOR-XR Take 150 mg by mouth daily.        Follow-up Information     [facility] Follow up.   Why: [facility] Contact information: [address] [place] [address] [phone]         Taylor Garbe, MD Follow up in 1 week(s).   Specialty: Internal Medicine Contact information: [address] [place] [address] [phone]                No Known  Allergies  You were cared for by a hospitalist during your hospital stay. If you have any questions about your discharge medications or the care you received while you were in the hospital after you are discharged, you can call the unit and asked to speak with the hospitalist on call if the hospitalist that took care of you is not available. Once you are discharged, your primary care physician will handle any further medical issues. Please note that no refills for any discharge medications will be authorized once you are discharged, as it is imperative that you return to your primary care physician (or establish a relationship with a primary care physician if you do not have one) for your aftercare needs so that they can reassess your need for medications and monitor your lab values.   Procedures/Studies: DG Chest 1 View  Result Date: 05/11/2021 CLINICAL DATA:  Shortness of breath with peripheral swelling. EXAM: CHEST  1 VIEW COMPARISON:  CT chest abdomen and pelvis 03/28/2021. FINDINGS: The heart is enlarged, unchanged. There are atelectatic changes in the lung bases. There is no pleural effusion or pneumothorax. No acute fractures are seen. There are severe degenerative changes of the left shoulder as seen on the prior examination with healed proximal left humeral fracture. IMPRESSION: No active disease. Electronically Signed   By: Darliss Cheney M.D.   On: 05/11/2021 19:18   CT Abdomen Pelvis W Contrast  Result Date: 05/19/2021 CLINICAL DATA:  Abdominal abscess/infection suspected EXAM: CT ABDOMEN AND PELVIS WITH CONTRAST TECHNIQUE: Multidetector CT imaging of the abdomen and pelvis was performed using the standard protocol following bolus administration of intravenous contrast. CONTRAST:  80mL OMNIPAQUE IOHEXOL 350 MG/ML SOLN COMPARISON:  None. FINDINGS: Lower chest: Lung bases are clear. Hepatobiliary: Low-attenuation liver consistent hepatic steatosis. Gallbladder normal. Pancreas: Pancreas is  normal. No ductal dilatation. No pancreatic inflammation. Spleen: Normal spleen Adrenals/urinary tract: Adrenal glands normal. Nonobstructing calculus in the mid RIGHT kidney. Bilateral renal cortical scarring. Large benign cyst of the RIGHT kidney. Ureters and bladder normal. Stomach/Bowel: Stomach, small bowel, appendix, and cecum are normal. The colon and rectosigmoid colon are normal. Vascular/Lymphatic: Abdominal aorta is normal caliber with atherosclerotic calcification. There is no retroperitoneal or periportal lymphadenopathy. No pelvic lymphadenopathy. Reproductive: Prostate unremarkable Other: No free fluid.  No  abscess Musculoskeletal: Degenerative changes of the spine. No acute findings IMPRESSION: 1. No abscess or infection identified in the abdomen pelvis. 2. Hepatic steatosis. 3. No bowel obstruction or inflammation. Electronically Signed   By: Genevive Bi M.D.   On: 05/19/2021 18:58   DG Chest Port 1 View  Result Date: 05/19/2021 CLINICAL DATA:  Larey Seat this morning, weakness today, history hypertension EXAM: PORTABLE CHEST 1 VIEW COMPARISON:  Portable exam 1206 hours compared to 05/11/2021 FINDINGS: Rotated to the RIGHT. Enlargement of cardiac silhouette. Mediastinal contours and pulmonary vascularity normal. Atherosclerotic calcification aorta. Lungs clear. No acute infiltrate, pleural effusion, or pneumothorax. Advanced LEFT glenohumeral degenerative changes and old posttraumatic deformity of proximal LEFT humerus. Osseous demineralization. IMPRESSION: Enlargement of cardiac silhouette. No acute abnormalities. Electronically Signed   By: Ulyses Southward M.D.   On: 05/19/2021 12:50   VAS [place] LOWER EXTREMITY VENOUS (DVT) (ONLY [facility] & [facility] 7a-7p)  Result Date: 05/19/2021  Lower Venous DVT Study Patient Name:  Taylor Myers  Date of Exam:   05/19/2021 Medical Rec #: [id number]    Accession #:    [id number] Date of Birth: [birth year]   Patient Gender: M Patient Age:   70 years Exam Location:  [facility] Procedure:      VAS [place] LOWER EXTREMITY VENOUS (DVT) Referring Phys: Mancel Bale --------------------------------------------------------------------------------  Indications: Edema.  Limitations: Body habitus, poor ultrasound/tissue interface and restricted mobility, patient position. Comparison Study: 07/02/2020 negative bilateral lower extremity venous duplex Performing Technologist: Gertie Fey MHA, RDMS, RVT, RDCS  Examination Guidelines: A complete evaluation includes B-mode imaging, spectral Doppler, color Doppler, and power Doppler as needed of all accessible portions of each vessel. Bilateral testing is considered an integral part of a complete examination. Limited examinations for reoccurring indications may be performed as noted. The reflux portion of the exam is performed with the patient in reverse Trendelenburg.  +---------+---------------+---------+-----------+----------+--------------+ RIGHT    CompressibilityPhasicitySpontaneityPropertiesThrombus Aging +---------+---------------+---------+-----------+----------+--------------+ CFV      Full           Yes      Yes                                 +---------+---------------+---------+-----------+----------+--------------+ SFJ      Full                                                        +---------+---------------+---------+-----------+----------+--------------+ FV Prox  Full                                                        +---------+---------------+---------+-----------+----------+--------------+ FV Mid   Full                                                        +---------+---------------+---------+-----------+----------+--------------+ FV DistalFull                                                        +---------+---------------+---------+-----------+----------+--------------+  PFV      Full                                                         +---------+---------------+---------+-----------+----------+--------------+ POP      Full           Yes      Yes                                 +---------+---------------+---------+-----------+----------+--------------+ PTV      Full                                                        +---------+---------------+---------+-----------+----------+--------------+ PERO     Full                                                        +---------+---------------+---------+-----------+----------+--------------+   +---------+---------------+---------+-----------+----------+--------------+ LEFT     CompressibilityPhasicitySpontaneityPropertiesThrombus Aging +---------+---------------+---------+-----------+----------+--------------+ CFV      Full           Yes      Yes                                 +---------+---------------+---------+-----------+----------+--------------+ SFJ      Full                                                        +---------+---------------+---------+-----------+----------+--------------+ FV Prox  Full                                                        +---------+---------------+---------+-----------+----------+--------------+ FV Mid   Full                                                        +---------+---------------+---------+-----------+----------+--------------+ FV DistalFull                                                        +---------+---------------+---------+-----------+----------+--------------+ PFV      Full                                                        +---------+---------------+---------+-----------+----------+--------------+  POP      Full           Yes      Yes                                 +---------+---------------+---------+-----------+----------+--------------+ PTV      Full                                                         +---------+---------------+---------+-----------+----------+--------------+ PERO     Full                                                        +---------+---------------+---------+-----------+----------+--------------+     Summary: RIGHT: - There is no evidence of deep vein thrombosis in the lower extremity.  - No cystic structure found in the popliteal fossa.  LEFT: - There is no evidence of deep vein thrombosis in the lower extremity.  - No cystic structure found in the popliteal fossa.  *See table(s) above for measurements and observations. Electronically signed by Waverly Ferrari MD on 05/19/2021 at 3:44:07 PM.    Final    US Abdomen Limited RUQ (LIVER/GB)  Result Date: 05/19/2021 CLINICAL DATA:  Elevated liver function tests EXAM: ULTRASOUND ABDOMEN LIMITED RIGHT UPPER QUADRANT COMPARISON:  CT 05/19/2021 FINDINGS: Gallbladder: No gallstones or wall thickening visualized. No sonographic Murphy sign noted by sonographer. Common bile duct: Diameter: 5 mm in proximal diameter Liver: The hepatic parenchymal echogenicity is diffusely increased and there is coarsening of the hepatic echotexture and poor acoustic through transmission in keeping with changes of moderate hepatic steatosis. No focal intrahepatic masses are seen. There is no intrahepatic biliary ductal dilation. Portal vein is patent on color Doppler imaging with normal direction of blood flow towards the liver. Other: 6.7 cm simple cyst noted within the visualized right kidney. IMPRESSION: Moderate hepatic steatosis. Electronically Signed   By: Helyn Numbers M.D.   On: 05/19/2021 20:34     The results of significant diagnostics from this hospitalization (including imaging, microbiology, ancillary and laboratory) are listed below for reference.     Microbiology: Recent Results (from the past 240 hour(s))  Resp Panel by RT-PCR (Flu A&B, Covid) Nasopharyngeal Swab     Status: None   Collection Time: 05/19/21 11:57 AM   Specimen:  Nasopharyngeal Swab; Nasopharyngeal(NP) swabs in vial transport medium  Result Value Ref Range Status   SARS Coronavirus 2 by RT PCR NEGATIVE NEGATIVE Final    Comment: (NOTE) SARS-CoV-2 target nucleic acids are NOT DETECTED.  The SARS-CoV-2 RNA is generally detectable in upper respiratory specimens during the acute phase of infection. The lowest concentration of SARS-CoV-2 viral copies this assay can detect is 138 copies/mL. A negative result does not preclude SARS-Cov-2 infection and should not be used as the sole basis for treatment or other patient management decisions. A negative result may occur with  improper specimen collection/handling, submission of specimen other than nasopharyngeal swab, presence of viral mutation(s) within the areas targeted by this assay, and inadequate number of viral copies(<138 copies/mL). A negative result must be combined with clinical observations,  patient history, and epidemiological information. The expected result is Negative.  Fact Sheet for Patients:  BloggerCourse.com  Fact Sheet for Healthcare Providers:  SeriousBroker.it  This test is no t yet approved or cleared by the [place] FDA and  has been authorized for detection and/or diagnosis of SARS-CoV-2 by FDA under an Emergency Use Authorization (EUA). This EUA will remain  in effect (meaning this test can be used) for the duration of the COVID-19 declaration under Section 564(b)(1) of the Act, 21 U.S.C.section 360bbb-3(b)(1), unless the authorization is terminated  or revoked sooner.       Influenza A by PCR NEGATIVE NEGATIVE Final   Influenza B by PCR NEGATIVE NEGATIVE Final    Comment: (NOTE) The Xpert Xpress SARS-CoV-2/FLU/RSV plus assay is intended as an aid in the diagnosis of influenza from Nasopharyngeal swab specimens and should not be used as a sole basis for treatment. Nasal washings and aspirates are unacceptable for  Xpert Xpress SARS-CoV-2/FLU/RSV testing.  Fact Sheet for Patients: BloggerCourse.com  Fact Sheet for Healthcare Providers: SeriousBroker.it  This test is not yet approved or cleared by the [place] FDA and has been authorized for detection and/or diagnosis of SARS-CoV-2 by FDA under an Emergency Use Authorization (EUA). This EUA will remain in effect (meaning this test can be used) for the duration of the COVID-19 declaration under Section 564(b)(1) of the Act, 21 U.S.C. section 360bbb-3(b)(1), unless the authorization is terminated or revoked.  Performed at [facility], [address] [place], [place] [address]      Labs: BNP (last 3 results) Recent Labs    05/11/21 1840  BNP 89.7   Basic Metabolic Panel: Recent Labs  Lab 05/19/21 1157 05/20/21 0346 05/21/21 0313 05/22/21 0125  NA 139 139 140 139  K 3.5 4.0 3.6 3.6  CL 103 106 106 105  CO2 24 23 23 28   GLUCOSE 103* 80 71 142*  BUN 16 15 10 9   CREATININE 1.33* 1.06 0.98 0.85  CALCIUM 8.2* 7.5* 7.7* 7.7*  MG  --  1.4* 1.3* 1.9   Liver Function Tests: Recent Labs  Lab 05/19/21 1157 05/20/21 0346 05/21/21 0313 05/22/21 0125  AST 247* 331* 467* 267*  ALT 85* 109* 160* 132*  ALKPHOS 68 57 61 73  BILITOT 3.3* 2.6* 2.4* 1.2  PROT 5.9* 5.0* 5.1* 4.7*  ALBUMIN 3.1* 2.6* 2.4* 2.3*   No results for input(s): LIPASE, AMYLASE in the last 168 hours. Recent Labs  Lab 05/19/21 1952 05/21/21 1113 05/21/21 1419  AMMONIA 20 22 23    CBC: Recent Labs  Lab 05/19/21 1157 05/20/21 0346 05/21/21 0313 05/22/21 0125  WBC 9.1 7.6 7.4 5.6  NEUTROABS 7.8* 5.5  --   --   HGB 12.1* 10.7* 10.7* 10.6*  HCT 36.7* 32.3* 33.6*  31.7* 32.9*  MCV 104.9* 105.6* 107.0* 106.1*  PLT 106* 123* 104* 116*   Cardiac Enzymes: Recent Labs  Lab 05/21/21 0313 05/22/21 0125  CKTOTAL 2,388* 1,840*   BNP: Invalid input(s): POCBNP CBG: No results for input(s): GLUCAP  in the last 168 hours. D-Dimer No results for input(s): DDIMER in the last 72 hours. Hgb A1c No results for input(s): HGBA1C in the last 72 hours. Lipid Profile No results for input(s): CHOL, HDL, LDLCALC, TRIG, CHOLHDL, LDLDIRECT in the last 72 hours. Thyroid function studies Recent Labs    05/21/21 0313  TSH 1.145   Anemia work up Recent Labs    05/21/21 0313  VITAMINB12 995*   Urinalysis  Component Value Date/Time   COLORURINE AMBER (A) 05/19/2021 1303   APPEARANCEUR HAZY (A) 05/19/2021 1303   LABSPEC 1.030 05/19/2021 1303   PHURINE 5.0 05/19/2021 1303   GLUCOSEU NEGATIVE 05/19/2021 1303   HGBUR NEGATIVE 05/19/2021 1303   BILIRUBINUR SMALL (A) 05/19/2021 1303   KETONESUR 20 (A) 05/19/2021 1303   PROTEINUR 100 (A) 05/19/2021 1303   NITRITE NEGATIVE 05/19/2021 1303   LEUKOCYTESUR NEGATIVE 05/19/2021 1303   Sepsis Labs Invalid input(s): PROCALCITONIN,  WBC,  LACTICIDVEN Microbiology Recent Results (from the past 240 hour(s))  Resp Panel by RT-PCR (Flu A&B, Covid) Nasopharyngeal Swab     Status: None   Collection Time: 05/19/21 11:57 AM   Specimen: Nasopharyngeal Swab; Nasopharyngeal(NP) swabs in vial transport medium  Result Value Ref Range Status   SARS Coronavirus 2 by RT PCR NEGATIVE NEGATIVE Final    Comment: (NOTE) SARS-CoV-2 target nucleic acids are NOT DETECTED.  The SARS-CoV-2 RNA is generally detectable in upper respiratory specimens during the acute phase of infection. The lowest concentration of SARS-CoV-2 viral copies this assay can detect is 138 copies/mL. A negative result does not preclude SARS-Cov-2 infection and should not be used as the sole basis for treatment or other patient management decisions. A negative result may occur with  improper specimen collection/handling, submission of specimen other than nasopharyngeal swab, presence of viral mutation(s) within the areas targeted by this assay, and inadequate number of viral copies(<138  copies/mL). A negative result must be combined with clinical observations, patient history, and epidemiological information. The expected result is Negative.  Fact Sheet for Patients:  BloggerCourse.com  Fact Sheet for Healthcare Providers:  SeriousBroker.it  This test is no t yet approved or cleared by the [place] FDA and  has been authorized for detection and/or diagnosis of SARS-CoV-2 by FDA under an Emergency Use Authorization (EUA). This EUA will remain  in effect (meaning this test can be used) for the duration of the COVID-19 declaration under Section 564(b)(1) of the Act, 21 U.S.C.section 360bbb-3(b)(1), unless the authorization is terminated  or revoked sooner.       Influenza A by PCR NEGATIVE NEGATIVE Final   Influenza B by PCR NEGATIVE NEGATIVE Final    Comment: (NOTE) The Xpert Xpress SARS-CoV-2/FLU/RSV plus assay is intended as an aid in the diagnosis of influenza from Nasopharyngeal swab specimens and should not be used as a sole basis for treatment. Nasal washings and aspirates are unacceptable for Xpert Xpress SARS-CoV-2/FLU/RSV testing.  Fact Sheet for Patients: BloggerCourse.com  Fact Sheet for Healthcare Providers: SeriousBroker.it  This test is not yet approved or cleared by the [place] FDA and has been authorized for detection and/or diagnosis of SARS-CoV-2 by FDA under an Emergency Use Authorization (EUA). This EUA will remain in effect (meaning this test can be used) for the duration of the COVID-19 declaration under Section 564(b)(1) of the Act, 21 U.S.C. section 360bbb-3(b)(1), unless the authorization is terminated or revoked.  Performed at [facility], [address], [place], [place] [address]      Time coordinating discharge:  I have spent 35 minutes face to face with the patient and on the ward discussing the patients  care, assessment, plan and disposition with other care givers. >50% of the time was devoted counseling the patient about the risks and benefits of treatment/Discharge disposition and coordinating care.   SIGNED:   Dimple Nanas, MD  [facility] 05/22/2021, 3:00 PM   If 7PM-7AM, please contact night-coverage

## 2021-05-22 NOTE — Progress Notes (Signed)
Patient's father notified of discharge today. When asked if he had any concerns, father requested Cloud to attend alcohol rehab. Notified of Mr Dolinger refusing rehab,  and he can not be forced. Father accepting his discharge and stating he is ready.

## 2021-05-22 NOTE — Progress Notes (Signed)
Physical Therapy Treatment Patient Details Name: Taylor Myers MRN: [id number] DOB: 1950-12-14 Today's Date: 05/22/2021   History of Present Illness 70 year old white male presented to the ER 05/19/21 after falling out of his wheelchair. Recent incr consumption ETOH with dehydration and AKI; CIWA initiated   PMH- atrial fibrillation on chronic anticoagulation, hypertension, wheelchair-bound status due to weakness, recent orbital fx with surgery pending 9/30    PT Comments    Patient reports he is feeling okay. Agreeable to PT session. He reports some increased weakness from his baseline. Was able to perform bed mobility with mod independence. Transferred bed><BSC with min guard. Unable to stand fully more of a squat pivot. Patient will continue to benefit from skilled PT while here to improve strength and functional independence.       Recommendations for follow up therapy are one component of a multi-disciplinary discharge planning process, led by the attending physician.  Recommendations may be updated based on patient status, additional functional criteria and insurance authorization.  Follow Up Recommendations  Home health PT     Equipment Recommendations  None recommended by PT -he is not sure where his wheelchair is. If at home or here somewhere. It is not in his room. He will need wheelchair for discharge home.    Recommendations for Other Services       Precautions / Restrictions Precautions Precautions: Fall Precaution Comments: recent fall (?falls) during transfers Restrictions Weight Bearing Restrictions: No     Mobility  Bed Mobility Overal bed mobility: Modified Independent Bed Mobility: Supine to Sit;Sit to Supine     Supine to sit: Modified independent (Device/Increase time) Sit to supine: Modified independent (Device/Increase time)        Transfers Overall transfer level: Needs assistance Equipment used: None Transfers: Squat Pivot Transfers     Squat  pivot transfers: Min guard     General transfer comment: Patient able to pivot transfer bed><BSC min guard. Reports sometimes he is able to stand better than that.  Ambulation/Gait             General Gait Details: patient reports he does not walk, will stand some for transfers but uses wheelchair at baseline.   Stairs             Wheelchair Mobility    Modified Rankin (Stroke Patients Only)       Balance Overall balance assessment: Needs assistance Sitting-balance support: Feet supported Sitting balance-Leahy Scale: Good     Standing balance support: Bilateral upper extremity supported;During functional activity Standing balance-Leahy Scale: Fair Standing balance comment: able to transfer with min guard. B UE support                            Cognition Arousal/Alertness: Awake/alert Behavior During Therapy: WFL for tasks assessed/performed Overall Cognitive Status: Within Functional Limits for tasks assessed                                        Exercises      General Comments        Pertinent Vitals/Pain Pain Assessment: No/denies pain    Home Living                      Prior Function            PT Goals (current goals can now be  found in the care plan section) Acute Rehab PT Goals Patient Stated Goal: wants to go home, is open to HHPT PT Goal Formulation: With patient Time For Goal Achievement: 06/04/21 Potential to Achieve Goals: Good Additional Goals Additional Goal #1: Will demonstrate wheelchair management and propulsion modified independently x 75 feet. Progress towards PT goals: Progressing toward goals    Frequency           PT Plan Discharge plan needs to be updated    Co-evaluation              AM-PAC PT "6 Clicks" Mobility   Outcome Measure  Help needed turning from your back to your side while in a flat bed without using bedrails?: A Little Help needed moving from lying  on your back to sitting on the side of a flat bed without using bedrails?: A Little Help needed moving to and from a bed to a chair (including a wheelchair)?: A Little Help needed standing up from a chair using your arms (e.g., wheelchair or bedside chair)?: A Lot Help needed to walk in hospital room?: Total Help needed climbing 3-5 steps with a railing? : Total 6 Click Score: 13    End of Session Equipment Utilized During Treatment: Gait belt Activity Tolerance: Patient limited by fatigue Patient left: in bed;with call bell/phone within reach;with bed alarm set Nurse Communication: Mobility status PT Visit Diagnosis: Unsteadiness on feet (R26.81);History of falling (Z91.81);Muscle weakness (generalized) (M62.81)     Time: 3419-3790 PT Time Calculation (min) (ACUTE ONLY): 21 min  Charges:  $Therapeutic Activity: 8-22 mins                     Yoona Ishii, PT, GCS 05/22/21,1:05 PM

## 2021-05-22 NOTE — Care Management Important Message (Signed)
Important Message  Patient Details  Name: Taylor Myers MRN: [id number] Date of Birth: 04-15-1951   Medicare Important Message Given:  Yes     Ramsie Ostrander Stefan Church 05/22/2021, 3:40 PM
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
## 2021-05-22 NOTE — Progress Notes (Signed)
Permission obtained from Mr Buckley Bradly for medical team to call his brother for collateral.
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
## 2021-09-21 ENCOUNTER — Other Ambulatory Visit: Payer: Self-pay

## 2021-09-21 ENCOUNTER — Emergency Department (HOSPITAL_BASED_OUTPATIENT_CLINIC_OR_DEPARTMENT_OTHER): Payer: Medicare Other | Admitting: Radiology

## 2021-09-21 ENCOUNTER — Encounter (HOSPITAL_BASED_OUTPATIENT_CLINIC_OR_DEPARTMENT_OTHER): Payer: Self-pay | Admitting: Obstetrics and Gynecology

## 2021-09-21 ENCOUNTER — Emergency Department (HOSPITAL_BASED_OUTPATIENT_CLINIC_OR_DEPARTMENT_OTHER)
Admission: EM | Admit: 2021-09-21 | Discharge: 2021-09-21 | Disposition: A | Discharge status: Home / Self Care | Payer: Medicare Other | Attending: Emergency Medicine | Admitting: Emergency Medicine

## 2021-09-21 DIAGNOSIS — S7012XA Contusion of left thigh, initial encounter: Secondary | ICD-10-CM | POA: Insufficient documentation

## 2021-09-21 DIAGNOSIS — M25552 Pain in left hip: Secondary | ICD-10-CM | POA: Insufficient documentation

## 2021-09-21 DIAGNOSIS — I10 Essential (primary) hypertension: Secondary | ICD-10-CM | POA: Insufficient documentation

## 2021-09-21 DIAGNOSIS — W19XXXA Unspecified fall, initial encounter: Secondary | ICD-10-CM | POA: Diagnosis not present

## 2021-09-21 DIAGNOSIS — S8992XA Unspecified injury of left lower leg, initial encounter: Secondary | ICD-10-CM | POA: Diagnosis present

## 2021-09-21 DIAGNOSIS — I4891 Unspecified atrial fibrillation: Secondary | ICD-10-CM | POA: Diagnosis not present

## 2021-09-21 DIAGNOSIS — Z79899 Other long term (current) drug therapy: Secondary | ICD-10-CM | POA: Diagnosis not present

## 2021-09-21 LAB — BASIC METABOLIC PANEL
Anion gap: 11 (ref 5–15)
BUN: 18 mg/dL (ref 8–23)
CO2: 27 mmol/L (ref 22–32)
Calcium: 9 mg/dL (ref 8.9–10.3)
Chloride: 102 mmol/L (ref 98–111)
Creatinine, Ser: 0.69 mg/dL (ref 0.61–1.24)
GFR, Estimated: >60 mL/min (ref >60)
Glucose, Bld: 82 mg/dL (ref 70–99)
Potassium: 4.1 mmol/L (ref 3.5–5.1)
Sodium: 140 mmol/L (ref 135–145)

## 2021-09-21 LAB — CBC WITH DIFFERENTIAL/PLATELET
Abs Immature Granulocytes: 0.02 10*3/uL (ref 0.00–0.07)
Basophils Absolute: 0 10*3/uL (ref 0.0–0.1)
Basophils Relative: 0 %
Eosinophils Absolute: 0.2 10*3/uL (ref 0.0–0.5)
Eosinophils Relative: 3 %
HCT: 34.3 % — ABNORMAL LOW (ref 39.0–52.0)
Hemoglobin: 11.4 g/dL — ABNORMAL LOW (ref 13.0–17.0)
Immature Granulocytes: 0 %
Lymphocytes Relative: 19 %
Lymphs Abs: 1.2 10*3/uL (ref 0.7–4.0)
MCH: 33.4 pg (ref 26.0–34.0)
MCHC: 33.2 g/dL (ref 30.0–36.0)
MCV: 100.6 fL — ABNORMAL HIGH (ref 80.0–100.0)
Monocytes Absolute: 0.7 10*3/uL (ref 0.1–1.0)
Monocytes Relative: 11 %
Neutro Abs: 4.3 10*3/uL (ref 1.7–7.7)
Neutrophils Relative %: 67 %
Platelets: 239 10*3/uL (ref 150–400)
RBC: 3.41 MIL/uL — ABNORMAL LOW (ref 4.22–5.81)
RDW: 13.5 % (ref 11.5–15.5)
WBC: 6.5 10*3/uL (ref 4.0–10.5)
nRBC: 0 % (ref 0.0–0.2)

## 2021-09-21 LAB — PROTIME-INR
INR: 1.2 (ref 0.8–1.2)
Prothrombin Time: 15 seconds (ref 11.4–15.2)

## 2021-09-21 NOTE — ED Triage Notes (Signed)
Patient reports to the ER for left hip pain. Patient reports x1 week ago he missed his wheelchair and hit the arm with the left hip. Patient has a bruise to the hip and is concerned about  a break

## 2021-09-21 NOTE — Discharge Instructions (Signed)
You were evaluated in the Emergency Department and after careful evaluation, we did not find any emergent condition requiring admission or further testing in the hospital.  Your exam/testing today was overall reassuring.  You have intact distal pulses and the compartments in your left thigh are soft.  Your hematoma will take some time to be resorbed by your body.  Advise rest, ice/heating, compression, elevation of the extremity.  NSAIDs for pain control.  Physical therapy will be helpful.  Please return to the Emergency Department if you experience any worsening of your condition.  Thank you for allowing [place] to be a part of your care.

## 2021-09-21 NOTE — ED Provider Notes (Addendum)
[place] EMERGENCY DEPT Provider Note   CSN: [id number] Arrival date & time: 09/21/21  1246     History  Chief Complaint  Patient presents with   Hip Pain    Taylor Myers is a 71 y.o. male.   Hip Pain   71 year old male with history of atrial fibrillation on Eliquis, HTN, wheelchair-bound due to weakness who fell 1 week ago and struck his left hip/thigh on his wheelchair on the way down.  Denies any head trauma or loss of consciousness.  He states that after the fall, he began to develop swelling/hematoma in his left lateral thigh which continued to grow over the course of 2 to 3 days.  He presents due to worsening pain in the vicinity of the left leg.  Due to persistent pain, he was worried about possible fracture.  He also had questions about whether or not he should continue his anticoagulation.  Home Medications Prior to Admission medications   Medication Sig Start Date End Date Taking? Authorizing Provider  allopurinol (ZYLOPRIM) 300 MG tablet Take 300 mg by mouth daily. 04/18/21   [provider]  amLODipine (NORVASC) 5 MG tablet Take 5 mg by mouth daily. 03/18/21   [provider]  atorvastatin (LIPITOR) 40 MG tablet Take 40 mg by mouth daily. 03/18/21   [provider]  ELIQUIS 5 MG TABS tablet Take 5 mg by mouth 2 (two) times daily. 03/09/21   [provider]  metoprolol succinate (TOPROL-XL) 25 MG 24 hr tablet Take 25 mg by mouth daily. 04/18/21   [provider]  oxybutynin (DITROPAN) 5 MG tablet Take 5 mg by mouth daily. 04/18/21   [provider]  venlafaxine XR (EFFEXOR-XR) 150 MG 24 hr capsule Take 150 mg by mouth daily. 04/18/21   [provider]      Allergies    Patient has no known allergies.    Review of Systems   Review of Systems  All other systems reviewed and are negative.  Physical Exam Updated Vital Signs BP 139/61 (BP Location: Left Arm)    Pulse 61    Temp 98.8 F (37.1 C)     Resp 15    Ht 5\' 9"  (1.753 m)    Wt 72.6 kg    SpO2 100%    BMI 23.63 kg/m  Physical Exam Vitals and nursing note reviewed.  Constitutional:      General: He is not in acute distress. HENT:     Head: Normocephalic and atraumatic.  Eyes:     Conjunctiva/sclera: Conjunctivae normal.     Pupils: Pupils are equal, round, and reactive to light.  Cardiovascular:     Rate and Rhythm: Normal rate and regular rhythm.  Pulmonary:     Effort: Pulmonary effort is normal. No respiratory distress.  Abdominal:     General: There is no distension.     Tenderness: There is no guarding.  Musculoskeletal:        General: No deformity or signs of injury.     Cervical back: Neck supple.     Comments: 2+ DP pulses in the left lower extremity, neurologically intact.  Left thigh lateral hematoma present, mild tenderness to palpation.  Remainder of compartments of the thigh soft.  Skin:    Findings: No lesion or rash.  Neurological:     General: No focal deficit present.     Mental Status: He is alert. Mental status is at baseline.    ED Results / Procedures /  Treatments   Labs (all labs ordered are listed, but only abnormal results are displayed) Labs Reviewed  CBC WITH DIFFERENTIAL/PLATELET - Abnormal; Notable for the following components:      Result Value   RBC 3.41 (*)    Hemoglobin 11.4 (*)    HCT 34.3 (*)    MCV 100.6 (*)    All other components within normal limits  BASIC METABOLIC PANEL  PROTIME-INR    EKG None  Radiology DG Hip Unilat With Pelvis 2-3 Views Left  Result Date: 09/21/2021 CLINICAL DATA:  Left hip pain after injury 1 week prior EXAM: DG HIP (WITH OR WITHOUT PELVIS) 2-3V LEFT COMPARISON:  05/19/2021 CT abdomen/pelvis FINDINGS: Partially visualized right total hip arthroplasty with no evidence of hardware fracture or loosening. No left hip fracture or dislocation. No pelvic fracture or diastasis. Moderate left hip osteoarthritis. Degenerative changes in the visualized  lower lumbar spine. No suspicious focal osseous lesions. IMPRESSION: No left hip fracture or dislocation. Moderate left hip osteoarthritis. Partially visualized right total hip arthroplasty with no evidence of hardware complication. Degenerative changes in the visualized lower lumbar spine. Electronically Signed   By: Ilona Sorrel M.D.   On: 09/21/2021 13:42    Procedures Procedures    Medications Ordered in ED Medications - No data to display  ED Course/ Medical Decision Making/ A&P       CHA2DS2-VASc Score: 3                    Medical Decision Making Amount and/or Complexity of Data Reviewed Labs: ordered. Radiology: ordered.   71 year old male with history of atrial fibrillation on Eliquis, HTN, wheelchair-bound due to weakness who fell 1 week ago and struck his left hip/thigh on his wheelchair on the way down.  Denies any head trauma or loss of consciousness.  He states that after the fall, he began to develop swelling/hematoma in his left lateral thigh which continued to grow over the course of 2 to 3 days.  He presents due to worsening pain in the vicinity of the left leg.  Due to persistent pain, he was worried about possible fracture.  He also had questions about whether or not he should continue his anticoagulation.  On arrival, the patient was afebrile, hemodynamically stable.  Patient presenting with a left lateral thigh hematoma that enlarged over 2 to 3 days 1 week ago after a fall.  No continued enlargement noted.  On arrival and exam, the patient had compartments that were soft with intact distal pulses.  No concern for active vascular compromise at this time.  No concern for compartment syndrome.  Reviewed labs, significant for hemoglobin that is stable at 11.3.  The patient's CHA2DS2-VASc score is a 3.  Advised that he continue his anticoagulation given his history of atrial fibrillation.  The patient was advised rest, ice, elevation of the extremity and compression as  tolerated and follow-up with his PCP.     Final Clinical Impression(s) / ED Diagnoses Final diagnoses:  Hematoma of left thigh, initial encounter    Rx / DC Orders ED Discharge Orders     None         Regan Lemming, MD 09/21/21 1643        Regan Lemming, MD 09/22/21 1525

## 2021-09-21 NOTE — ED Provider Notes (Incomplete Revision)
[facility] DEPT Provider Note   CSN: [id number] Arrival date & time: 09/21/21  1246     History  Chief Complaint  Patient presents with   Hip Pain    Taylor Myers is a 71 y.o. male.   Hip Pain   71 year old male with history of atrial fibrillation on Eliquis, HTN, wheelchair-bound due to weakness who fell 1 week ago and struck his left hip/thigh on his wheelchair on the way down.  Denies any head trauma or loss of consciousness.  He states that after the fall, he began to develop swelling/hematoma in his left lateral thigh which continued to grow over the course of 2 to 3 days.  He presents due to worsening pain in the vicinity of the left leg.  Due to persistent pain, he was worried about possible fracture.  He also had questions about whether or not he should continue his anticoagulation.  Home Medications Prior to Admission medications   Medication Sig Start Date End Date Taking? Authorizing Provider  allopurinol (ZYLOPRIM) 300 MG tablet Take 300 mg by mouth daily. 04/18/21   [provider]  amLODipine (NORVASC) 5 MG tablet Take 5 mg by mouth daily. 03/18/21   [provider]  atorvastatin (LIPITOR) 40 MG tablet Take 40 mg by mouth daily. 03/18/21   [provider]  ELIQUIS 5 MG TABS tablet Take 5 mg by mouth 2 (two) times daily. 03/09/21   [provider]  metoprolol succinate (TOPROL-XL) 25 MG 24 hr tablet Take 25 mg by mouth daily. 04/18/21   [provider]  oxybutynin (DITROPAN) 5 MG tablet Take 5 mg by mouth daily. 04/18/21   [provider]  venlafaxine XR (EFFEXOR-XR) 150 MG 24 hr capsule Take 150 mg by mouth daily. 04/18/21   [provider]      Allergies    Patient has no known allergies.    Review of Systems   Review of Systems  All other systems reviewed and are negative.  Physical Exam Updated Vital Signs BP 139/61 (BP Location: Left Arm)    Pulse 61    Temp 98.8 F (37.1 C)     Resp 15    Ht 5\' 9"  (1.753 m)    Wt 72.6 kg    SpO2 100%    BMI 23.63 kg/m  Physical Exam Vitals and nursing note reviewed.  Constitutional:      General: He is not in acute distress. HENT:     Head: Normocephalic and atraumatic.  Eyes:     Conjunctiva/sclera: Conjunctivae normal.     Pupils: Pupils are equal, round, and reactive to light.  Cardiovascular:     Rate and Rhythm: Normal rate and regular rhythm.  Pulmonary:     Effort: Pulmonary effort is normal. No respiratory distress.  Abdominal:     General: There is no distension.     Tenderness: There is no guarding.  Musculoskeletal:        General: No deformity or signs of injury.     Cervical back: Neck supple.     Comments: 2+ DP pulses in the left lower extremity, neurologically intact.  Left thigh lateral hematoma present, mild tenderness to palpation.  Remainder of compartments of the thigh soft.  Skin:    Findings: No lesion or rash.  Neurological:     General: No focal deficit present.     Mental Status: He is alert. Mental status is at baseline.    ED Results / Procedures /  Treatments   Labs (all labs ordered are listed, but only abnormal results are displayed) Labs Reviewed  CBC WITH DIFFERENTIAL/PLATELET - Abnormal; Notable for the following components:      Result Value   RBC 3.41 (*)    Hemoglobin 11.4 (*)    HCT 34.3 (*)    MCV 100.6 (*)    All other components within normal limits  PROTIME-INR  BASIC METABOLIC PANEL    EKG None  Radiology DG Hip Unilat With Pelvis 2-3 Views Left  Result Date: 09/21/2021 CLINICAL DATA:  Left hip pain after injury 1 week prior EXAM: DG HIP (WITH OR WITHOUT PELVIS) 2-3V LEFT COMPARISON:  05/19/2021 CT abdomen/pelvis FINDINGS: Partially visualized right total hip arthroplasty with no evidence of hardware fracture or loosening. No left hip fracture or dislocation. No pelvic fracture or diastasis. Moderate left hip osteoarthritis. Degenerative changes in the visualized  lower lumbar spine. No suspicious focal osseous lesions. IMPRESSION: No left hip fracture or dislocation. Moderate left hip osteoarthritis. Partially visualized right total hip arthroplasty with no evidence of hardware complication. Degenerative changes in the visualized lower lumbar spine. Electronically Signed   By: Ilona Sorrel M.D.   On: 09/21/2021 13:42    Procedures Procedures    Medications Ordered in ED Medications - No data to display  ED Course/ Medical Decision Making/ A&P       CHA2DS2-VASc Score: 3                    Medical Decision Making Amount and/or Complexity of Data Reviewed Labs: ordered. Radiology: ordered.   71 year old male with history of atrial fibrillation on Eliquis, HTN, wheelchair-bound due to weakness who fell 1 week ago and struck his left hip/thigh on his wheelchair on the way down.  Denies any head trauma or loss of consciousness.  He states that after the fall, he began to develop swelling/hematoma in his left lateral thigh which continued to grow over the course of 2 to 3 days.  He presents due to worsening pain in the vicinity of the left leg.  Due to persistent pain, he was worried about possible fracture.  He also had questions about whether or not he should continue his anticoagulation.  On arrival, the patient was afebrile, hemodynamically stable.  Patient presenting with a left lateral thigh hematoma that enlarged over 2 to 3 days 1 week ago after a fall.  No continued enlargement noted.  On arrival and exam, the patient had compartments that were soft with intact distal pulses.  No concern for active vascular compromise at this time.  No concern for compartment syndrome.  The patient's CHA2DS2-VASc score is a 3.  Advised that he continue his anticoagulation given his history of atrial fibrillation.  The patient was advised rest, ice, elevation of the extremity and compression as tolerated and follow-up with his PCP.     Final Clinical Impression(s)  / ED Diagnoses Final diagnoses:  Hematoma of left thigh, initial encounter    Rx / DC Orders ED Discharge Orders     None         Regan Lemming, MD 09/21/21 1643

## 2022-03-10 ENCOUNTER — Other Ambulatory Visit: Payer: Self-pay

## 2022-03-10 ENCOUNTER — Emergency Department (HOSPITAL_BASED_OUTPATIENT_CLINIC_OR_DEPARTMENT_OTHER)
Admission: EM | Admit: 2022-03-10 | Discharge: 2022-03-10 | Disposition: A | Discharge status: Home / Self Care | Payer: Medicare Other | Attending: Emergency Medicine | Admitting: Emergency Medicine

## 2022-03-10 ENCOUNTER — Emergency Department (HOSPITAL_BASED_OUTPATIENT_CLINIC_OR_DEPARTMENT_OTHER): Payer: Medicare Other

## 2022-03-10 ENCOUNTER — Encounter (HOSPITAL_BASED_OUTPATIENT_CLINIC_OR_DEPARTMENT_OTHER): Payer: Self-pay

## 2022-03-10 DIAGNOSIS — M47812 Spondylosis without myelopathy or radiculopathy, cervical region: Secondary | ICD-10-CM | POA: Diagnosis not present

## 2022-03-10 DIAGNOSIS — S42471A Displaced transcondylar fracture of right humerus, initial encounter for closed fracture: Secondary | ICD-10-CM | POA: Insufficient documentation

## 2022-03-10 DIAGNOSIS — S42401A Unspecified fracture of lower end of right humerus, initial encounter for closed fracture: Secondary | ICD-10-CM

## 2022-03-10 DIAGNOSIS — S42421A Displaced comminuted supracondylar fracture without intercondylar fracture of right humerus, initial encounter for closed fracture: Secondary | ICD-10-CM | POA: Diagnosis not present

## 2022-03-10 DIAGNOSIS — R9082 White matter disease, unspecified: Secondary | ICD-10-CM | POA: Insufficient documentation

## 2022-03-10 DIAGNOSIS — G9389 Other specified disorders of brain: Secondary | ICD-10-CM | POA: Insufficient documentation

## 2022-03-10 DIAGNOSIS — I10 Essential (primary) hypertension: Secondary | ICD-10-CM | POA: Insufficient documentation

## 2022-03-10 DIAGNOSIS — Z7901 Long term (current) use of anticoagulants: Secondary | ICD-10-CM | POA: Diagnosis not present

## 2022-03-10 DIAGNOSIS — W050XXA Fall from non-moving wheelchair, initial encounter: Secondary | ICD-10-CM | POA: Insufficient documentation

## 2022-03-10 DIAGNOSIS — Z79899 Other long term (current) drug therapy: Secondary | ICD-10-CM | POA: Diagnosis not present

## 2022-03-10 DIAGNOSIS — S59901A Unspecified injury of right elbow, initial encounter: Secondary | ICD-10-CM | POA: Diagnosis present

## 2022-03-10 LAB — CBC WITH DIFFERENTIAL/PLATELET
Abs Immature Granulocytes: 0.03 10*3/uL (ref 0.00–0.07)
Basophils Absolute: 0 10*3/uL (ref 0.0–0.1)
Basophils Relative: 0 %
Eosinophils Absolute: 0.1 10*3/uL (ref 0.0–0.5)
Eosinophils Relative: 1 %
HCT: 37.2 % — ABNORMAL LOW (ref 39.0–52.0)
Hemoglobin: 12.7 g/dL — ABNORMAL LOW (ref 13.0–17.0)
Immature Granulocytes: 0 %
Lymphocytes Relative: 11 %
Lymphs Abs: 0.9 10*3/uL (ref 0.7–4.0)
MCH: 34.4 pg — ABNORMAL HIGH (ref 26.0–34.0)
MCHC: 34.1 g/dL (ref 30.0–36.0)
MCV: 100.8 fL — ABNORMAL HIGH (ref 80.0–100.0)
Monocytes Absolute: 0.4 10*3/uL (ref 0.1–1.0)
Monocytes Relative: 5 %
Neutro Abs: 7.3 10*3/uL (ref 1.7–7.7)
Neutrophils Relative %: 83 %
Platelets: 153 10*3/uL (ref 150–400)
RBC: 3.69 MIL/uL — ABNORMAL LOW (ref 4.22–5.81)
RDW: 12.2 % (ref 11.5–15.5)
WBC: 8.8 10*3/uL (ref 4.0–10.5)
nRBC: 0 % (ref 0.0–0.2)

## 2022-03-10 LAB — PROTIME-INR
INR: 1.1 (ref 0.8–1.2)
Prothrombin Time: 13.8 seconds (ref 11.4–15.2)

## 2022-03-10 LAB — BASIC METABOLIC PANEL
Anion gap: 11 (ref 5–15)
BUN: 15 mg/dL (ref 8–23)
CO2: 28 mmol/L (ref 22–32)
Calcium: 9.1 mg/dL (ref 8.9–10.3)
Chloride: 101 mmol/L (ref 98–111)
Creatinine, Ser: 0.65 mg/dL (ref 0.61–1.24)
GFR, Estimated: >60 mL/min (ref >60)
Glucose, Bld: 106 mg/dL — ABNORMAL HIGH (ref 70–99)
Potassium: 3 mmol/L — ABNORMAL LOW (ref 3.5–5.1)
Sodium: 140 mmol/L (ref 135–145)

## 2022-03-10 MED ORDER — OXYCODONE-ACETAMINOPHEN 5-325 MG PO TABS: 1.0000 | ORAL_TABLET | Freq: Four times a day (QID) | ORAL | 0 refills | Status: DC | PRN

## 2022-03-10 MED ORDER — FENTANYL CITRATE PF 50 MCG/ML IJ SOSY
50.0000 ug | PREFILLED_SYRINGE | Freq: Once | INTRAMUSCULAR | Status: AC
  Administered 2022-03-10: 50 ug via INTRAVENOUS
  Filled 2022-03-10: qty 1

## 2022-03-10 MED ORDER — POTASSIUM CHLORIDE CRYS ER 20 MEQ PO TBCR
40.0000 meq | EXTENDED_RELEASE_TABLET | Freq: Once | ORAL | Status: AC
  Administered 2022-03-10: 40 meq via ORAL
  Filled 2022-03-10: qty 2

## 2022-03-10 MED ORDER — HYDROCODONE-ACETAMINOPHEN 5-325 MG PO TABS
1.0000 | ORAL_TABLET | Freq: Once | ORAL | Status: AC
  Administered 2022-03-10: 1 via ORAL
  Filled 2022-03-10: qty 1

## 2022-03-10 NOTE — Discharge Instructions (Addendum)
Please follow-up outpatient with orthopedics outpatient. Percocet has been provided for pain control.

## 2022-03-10 NOTE — ED Triage Notes (Signed)
Pt BIB [facility] from home, pt was in his wheelchair going down the ramp, pt fell out of his wheelchair landing on his Right elbow.   Pt denies hitting his head, denies neck pain or LOC    BP 150/80 HR 70 RR 18 100% RA

## 2022-03-10 NOTE — ED Provider Notes (Signed)
[facility]   CSN: [id number] Arrival date & time: 03/10/22  1553     History  Chief Complaint  Patient presents with   Taylor BumpersFall    Taylor Myers is a 71 y.o. male.   Fall    71 year old male with medical history significant for hypertension, atrial fibrillation on Eliquis, wheelchair-bound due to weakness who presents to the emergency department after a fall from his wheelchair.  The patient states that he did not take his Eliquis this morning.  He did not hit his head or lose consciousness.  He denies any neck pain.  He states that he fell onto his right elbow.  Since then he has had pain with range of motion and difficulty with swelling in the right elbow.  He arrived to the emergency department GCS 15, ABC intact with no other complaints or injuries.  Home Medications Prior to Admission medications   Medication Sig Start Date End Date Taking? Authorizing Provider  oxyCODONE-acetaminophen (PERCOCET/ROXICET) 5-325 MG tablet Take 1 tablet by mouth every 6 (six) hours as needed for severe pain. 03/10/22  Yes Ernie AvenaLawsing, Dempsy Damiano, MD  allopurinol (ZYLOPRIM) 300 MG tablet Take 300 mg by mouth daily. 04/18/21   [provider]  amLODipine (NORVASC) 5 MG tablet Take 5 mg by mouth daily. 03/18/21   [provider]  atorvastatin (LIPITOR) 40 MG tablet Take 40 mg by mouth daily. 03/18/21   [provider]  ELIQUIS 5 MG TABS tablet Take 5 mg by mouth 2 (two) times daily. 03/09/21   [provider]  metoprolol succinate (TOPROL-XL) 25 MG 24 hr tablet Take 25 mg by mouth daily. 04/18/21   [provider]  oxybutynin (DITROPAN) 5 MG tablet Take 5 mg by mouth daily. 04/18/21   [provider]  venlafaxine XR (EFFEXOR-XR) 150 MG 24 hr capsule Take 150 mg by mouth daily. 04/18/21   [provider]      Allergies    Patient has no known allergies.    Review of Systems   Review of Systems  All other  systems reviewed and are negative.   Physical Exam Updated Vital Signs BP (!) 142/98 (BP Location: Right Arm)   Pulse 93   Temp 98.5 F (36.9 C) (Oral)   Resp 16   SpO2 100%  Physical Exam Vitals and nursing note reviewed.  Constitutional:      Appearance: He is well-developed.     Comments: GCS 15, ABC intact  HENT:     Head: Normocephalic.  Eyes:     Conjunctiva/sclera: Conjunctivae normal.  Neck:     Comments: No midline tenderness to palpation of the cervical spine. ROM intact. Cardiovascular:     Rate and Rhythm: Normal rate and regular rhythm.     Heart sounds: No murmur heard. Pulmonary:     Effort: Pulmonary effort is normal. No respiratory distress.     Breath sounds: Normal breath sounds.  Chest:     Comments: Chest wall stable and non-tender to AP and lateral compression. Clavicles stable and non-tender to AP compression Abdominal:     Palpations: Abdomen is soft.     Tenderness: There is no abdominal tenderness.     Comments: Pelvis stable to lateral compression.  Musculoskeletal:        General: Swelling and tenderness present.     Cervical back: Neck supple.     Comments: No midline tenderness to palpation of the thoracic or lumbar spine. Extremities atraumatic with intact  ROM with the exception of pain and swelling to the right elbow noted.  Distally neurovascularly intact.  Skin:    General: Skin is warm and dry.  Neurological:     Mental Status: He is alert.     Comments: CN II-XII grossly intact. Moving all four extremities spontaneously and sensation grossly intact.     ED Results / Procedures / Treatments   Labs (all labs ordered are listed, but only abnormal results are displayed) Labs Reviewed  CBC WITH DIFFERENTIAL/PLATELET - Abnormal; Notable for the following components:      Result Value   RBC 3.69 (*)    Hemoglobin 12.7 (*)    HCT 37.2 (*)    MCV 100.8 (*)    MCH 34.4 (*)    All other components within normal limits  BASIC METABOLIC  PANEL - Abnormal; Notable for the following components:   Potassium 3.0 (*)    Glucose, Bld 106 (*)    All other components within normal limits  PROTIME-INR    EKG None  Radiology CT Elbow Right Wo Contrast  Result Date: 03/10/2022 CLINICAL DATA:  Complex elbow fracture. EXAM: CT OF THE UPPER RIGHT EXTREMITY WITHOUT CONTRAST, 3D CT image rendering on workstation TECHNIQUE: Multidetector CT imaging of the upper right extremity was performed according to the standard protocol. RADIATION DOSE REDUCTION: This exam was performed according to the departmental dose-optimization program which includes automated exposure control, adjustment of the mA and/or kV according to patient size and/or use of iterative reconstruction technique. COMPARISON:  Radiographs, same date. FINDINGS: Bones/Joint/Cartilage Examination is quite limited due to the position of the patent shin and artifact from the patient's body. Complex comminuted transcondylar and supracondylar fracture of the distal humerus. The fracture through the base of the trochlea is comminuted and displaced maximum of 8 mm. The radius and ulna are intact. IMPRESSION: Complex comminuted transcondylar and supracondylar fracture of the distal humerus. Electronically Signed   By: Rudie Meyer M.D.   On: 03/10/2022 18:28   CT 3D RECON AT SCANNER  Result Date: 03/10/2022 CLINICAL DATA:  Complex elbow fracture. EXAM: CT OF THE UPPER RIGHT EXTREMITY WITHOUT CONTRAST, 3D CT image rendering on workstation TECHNIQUE: Multidetector CT imaging of the upper right extremity was performed according to the standard protocol. RADIATION DOSE REDUCTION: This exam was performed according to the departmental dose-optimization program which includes automated exposure control, adjustment of the mA and/or kV according to patient size and/or use of iterative reconstruction technique. COMPARISON:  Radiographs, same date. FINDINGS: Bones/Joint/Cartilage Examination is quite  limited due to the position of the patent shin and artifact from the patient's body. Complex comminuted transcondylar and supracondylar fracture of the distal humerus. The fracture through the base of the trochlea is comminuted and displaced maximum of 8 mm. The radius and ulna are intact. IMPRESSION: Complex comminuted transcondylar and supracondylar fracture of the distal humerus. Electronically Signed   By: Rudie Meyer M.D.   On: 03/10/2022 18:28   CT Head Wo Contrast  Result Date: 03/10/2022 CLINICAL DATA:  Head trauma. EXAM: CT HEAD WITHOUT CONTRAST CT CERVICAL SPINE WITHOUT CONTRAST TECHNIQUE: Multidetector CT imaging of the head and cervical spine was performed following the standard protocol without intravenous contrast. Multiplanar CT image reconstructions of the cervical spine were also generated. RADIATION DOSE REDUCTION: This exam was performed according to the departmental dose-optimization program which includes automated exposure control, adjustment of the mA and/or kV according to patient size and/or use of iterative reconstruction technique. COMPARISON:  CT  scan 03/28/2021 FINDINGS: CT HEAD FINDINGS Brain: Stable age related cerebral atrophy, ventriculomegaly and periventricular white matter disease. No extra-axial fluid collections are identified. No CT findings for acute hemispheric infarction or intracranial hemorrhage. No mass lesions. The brainstem and cerebellum are normal. Vascular: Stable vascular calcifications. No aneurysm or hyperdense vessels. Skull: Remote postsurgical changes in the right occipital area from a prior craniotomy. No acute skull fracture. Sinuses/Orbits: The paranasal sinuses and mastoid air cells are clear. The globes are intact. Other: No scalp lesions or scalp hematoma. Remote left orbital floor fracture. CT CERVICAL SPINE FINDINGS Alignment: Stable degenerative cervical spondylosis with multi level degenerative subluxations. Skull base and vertebrae:  Degenerative changes at the skull base C1 and C1-2 articulations. No acute cervical spine fracture is identified. Again demonstrated are large bridging syndesmophytes anteriorly. Soft tissues and spinal canal: No prevertebral fluid or swelling. No visible canal hematoma. Disc levels: Advanced degenerative cervical spondylosis with multilevel disc disease and facet disease but no significant canal stenosis or obvious large acute disc protrusion. There is a stable large posterior spur at C3-4 with mass effect on the thecal sac. Upper chest: The lung apices are grossly clear. Other: No neck mass or adenopathy. Significant bilateral carotid artery calcifications. IMPRESSION: 1. Stable age related cerebral atrophy, ventriculomegaly and periventricular white matter disease. 2. No acute intracranial findings or skull fracture. 3. Stable degenerative cervical spondylosis with multi level degenerative subluxations. 4. No acute cervical spine fracture. Electronically Signed   By: Rudie Meyer M.D.   On: 03/10/2022 18:20   CT Cervical Spine Wo Contrast  Result Date: 03/10/2022 CLINICAL DATA:  Head trauma. EXAM: CT HEAD WITHOUT CONTRAST CT CERVICAL SPINE WITHOUT CONTRAST TECHNIQUE: Multidetector CT imaging of the head and cervical spine was performed following the standard protocol without intravenous contrast. Multiplanar CT image reconstructions of the cervical spine were also generated. RADIATION DOSE REDUCTION: This exam was performed according to the departmental dose-optimization program which includes automated exposure control, adjustment of the mA and/or kV according to patient size and/or use of iterative reconstruction technique. COMPARISON:  CT scan 03/28/2021 FINDINGS: CT HEAD FINDINGS Brain: Stable age related cerebral atrophy, ventriculomegaly and periventricular white matter disease. No extra-axial fluid collections are identified. No CT findings for acute hemispheric infarction or intracranial hemorrhage.  No mass lesions. The brainstem and cerebellum are normal. Vascular: Stable vascular calcifications. No aneurysm or hyperdense vessels. Skull: Remote postsurgical changes in the right occipital area from a prior craniotomy. No acute skull fracture. Sinuses/Orbits: The paranasal sinuses and mastoid air cells are clear. The globes are intact. Other: No scalp lesions or scalp hematoma. Remote left orbital floor fracture. CT CERVICAL SPINE FINDINGS Alignment: Stable degenerative cervical spondylosis with multi level degenerative subluxations. Skull base and vertebrae: Degenerative changes at the skull base C1 and C1-2 articulations. No acute cervical spine fracture is identified. Again demonstrated are large bridging syndesmophytes anteriorly. Soft tissues and spinal canal: No prevertebral fluid or swelling. No visible canal hematoma. Disc levels: Advanced degenerative cervical spondylosis with multilevel disc disease and facet disease but no significant canal stenosis or obvious large acute disc protrusion. There is a stable large posterior spur at C3-4 with mass effect on the thecal sac. Upper chest: The lung apices are grossly clear. Other: No neck mass or adenopathy. Significant bilateral carotid artery calcifications. IMPRESSION: 1. Stable age related cerebral atrophy, ventriculomegaly and periventricular white matter disease. 2. No acute intracranial findings or skull fracture. 3. Stable degenerative cervical spondylosis with multi level degenerative subluxations. 4. No acute  cervical spine fracture. Electronically Signed   By: Rudie Meyer M.D.   On: 03/10/2022 18:20   DG Elbow Complete Right  Result Date: 03/10/2022 CLINICAL DATA:  Limited elbow mobility after fall EXAM: RIGHT ELBOW - COMPLETE 3+ VIEW COMPARISON:  None Available. FINDINGS: Displaced comminuted supracondylar fracture of the distal right humerus likely with intra-articular extension. The distal fracture fragment is displaced anteriorly  approximately 9 mm. Cortical irregularity about the superior right radial head is suspicious for additional minimally displaced fracture best appreciated on oblique view. Moderate elbow joint effusion. Subcutaneous soft tissue calcifications posterior to the ulna. Vascular calcifications. IMPRESSION: 1. Displaced comminuted supracondylar fracture of the distal right humerus likely with intra articular extension. 2. Possible minimally displaced fracture of the superior radial head. Electronically Signed   By: Minerva Fester M.D.   On: 03/10/2022 16:41    Procedures .Ortho Injury Treatment  Date/Time: 03/10/2022 6:50 PM  Performed by: Ernie Avena, MD Authorized by: Ernie Avena, MD   Consent:    Consent obtained:  Verbal   Consent given by:  Patient   Risks discussed:  Restricted joint movement and vascular damageInjury location: elbow Location details: right elbow Injury type: fracture Pre-procedure neurovascular assessment: neurovascularly intact  Anesthesia: Local anesthesia used: no Immobilization: splint Splint type: long arm Splint Applied by: Milon Dikes Post-procedure neurovascular assessment: post-procedure neurovascularly intact       Medications Ordered in ED Medications  HYDROcodone-acetaminophen (NORCO/VICODIN) 5-325 MG per tablet 1 tablet (has no administration in time range)  fentaNYL (SUBLIMAZE) injection 50 mcg (50 mcg Intravenous Given 03/10/22 1734)  potassium chloride SA (KLOR-CON M) CR tablet 40 mEq (40 mEq Oral Given 03/10/22 1844)    ED Course/ Medical Decision Making/ A&P                           Medical Decision Making Amount and/or Complexity of Data Reviewed Labs: ordered. Radiology: ordered.  Risk Prescription drug management.   71 year old male with medical history significant for hypertension, atrial fibrillation on Eliquis, wheelchair-bound due to weakness who presents to the emergency department after a fall from his wheelchair.  The  patient states that he did not take his Eliquis this morning.  He did not hit his head or lose consciousness.  He denies any neck pain.  He states that he fell onto his right elbow.  Since then he has had pain with range of motion and difficulty with swelling in the right elbow.  He arrived to the emergency department GCS 15, ABC intact with no other complaints or injuries.  On arrival, the patient was vitally stable.  Concern for fracture about the elbow.  X-ray imaging was performed from triage and was concerning for supracondylar fracture that is displaced and intra-articular.  I did speak with on-call orthopedics, Dr. Raynald Kemp who recommended CT imaging of the right elbow with a 3D recon and splinting once complete.  Ct Elbow: IMPRESSION:  Complex comminuted transcondylar and supracondylar fracture of the  distal humerus.   CT elbow did confirm a complex comminuted transcondylar and supracondylar fracture of the distal humerus.  A CT head and CT cervical spine was performed due to a distracting injury and the patient being on anticoagulation which was negative for acute intracranial abnormality, negative for fracture or malalignment of the cervical spine. Screening laboratory evaluation significant for mild hypokalemia to 3.0, replenished orally, PT/INR normal, CBC without a leukocytosis, stable anemia 12.7, improved from prior measurements.  The patient's right arm was splinted in a long-arm splint per the procedural note bed by an orthopedics tech and the patient was a ministered a sling for comfort.  He was advised follow-up with Dr. Roda Shutters outpatient.  He was advised outpatient narcotics for short course for pain control.   Neurovascularly intact status post splinting.  Stable for discharge.   Final Clinical Impression(s) / ED Diagnoses Final diagnoses:  Closed fracture of right elbow, initial encounter    Rx / DC Orders ED Discharge Orders          Ordered    AMB referral to orthopedics         03/10/22 1843    oxyCODONE-acetaminophen (PERCOCET/ROXICET) 5-325 MG tablet  Every 6 hours PRN        03/10/22 1954              Ernie Avena, MD 03/10/22 2001

## 2022-03-12 ENCOUNTER — Ambulatory Visit (INDEPENDENT_AMBULATORY_CARE_PROVIDER_SITE_OTHER): Payer: Medicare Other | Admitting: Orthopaedic Surgery

## 2022-03-12 ENCOUNTER — Encounter: Payer: Self-pay | Admitting: Orthopaedic Surgery

## 2022-03-12 DIAGNOSIS — S42411A Displaced simple supracondylar fracture without intercondylar fracture of right humerus, initial encounter for closed fracture: Secondary | ICD-10-CM

## 2022-03-12 NOTE — Progress Notes (Signed)
Office Visit Note   Patient: Taylor Myers           Date of Birth: [birth year]           MRN: [id number] Visit Date: 03/12/2022              Requested by: Gaspar Garbe, MD [address] [place],  [place] [address] PCP: Gaspar Garbe, MD   Assessment & Plan: Visit Diagnoses:  1. Right supracondylar humerus fracture, closed, initial encounter     Plan: Impression is intra-articular right distal humerus fracture.  I have reviewed the x-rays and CT scan and I have recommended ORIF.  Risk benefits prognosis of the surgery reviewed with the patient.  Given his social situation I think it is best to do this in the hospital and keep him there so he can be evaluated by therapy.  May need short-term stay in a SNF.  We put a long-arm splint back on him.  He will need to stop taking his Eliquis 3 days before the surgery.  Total face to face encounter time was greater than 45 minutes and over half of this time was spent in counseling and/or coordination of care.  Follow-Up Instructions: No follow-ups on file.   Orders:  No orders of the defined types were placed in this encounter.  No orders of the defined types were placed in this encounter.     Procedures: No procedures performed   Clinical Data: No additional findings.   Subjective: No chief complaint on file.   HPI Taylor Myers is a follow-up from the ER for right distal humerus fracture.  Mainly a wheelchair and rolling walker ambulator.  His wheelchair rolled off the left and he hit his elbow directly.  Takes Eliquis for A-fib.  Right-hand-dominant and retired.  Lives at home with 22 year old father.  Review of Systems  Constitutional: Negative.   All other systems reviewed and are negative.   Objective: Vital Signs: There were no vitals taken for this visit.  Physical Exam Vitals and nursing note reviewed.  Constitutional:      Appearance: He is well-developed.  HENT:     Head: Normocephalic and atraumatic.   Eyes:     Pupils: Pupils are equal, round, and reactive to light.  Pulmonary:     Effort: Pulmonary effort is normal.  Abdominal:     Palpations: Abdomen is soft.  Musculoskeletal:        General: Normal range of motion.     Cervical back: Neck supple.  Skin:    General: Skin is warm.  Neurological:     Mental Status: He is alert and oriented to person, place, and time.  Psychiatric:        Behavior: Behavior normal.        Thought Content: Thought content normal.        Judgment: Judgment normal.   Ortho Exam Examination of the right elbow shows mild swelling.  Skin intact.  Light bruising.  Neurovascular intact distally. Specialty Comments:  No specialty comments available.  Imaging: No results found.   PMFS History: Patient Active Problem List   Diagnosis Date Noted   Right supracondylar humerus fracture, closed, initial encounter 03/12/2022   Malnutrition of moderate degree 05/21/2021   Malnutrition (HCC) 05/19/2021   Gait difficulty 05/19/2021   Muscle weakness 05/19/2021   Noncompliance with medication regimen 05/19/2021   Stenosis of left carotid artery 05/19/2021   AKI (acute kidney injury) (HCC) 05/19/2021   Elevated LFTs 05/19/2021  Wheelchair bound 05/19/2021   Closed fracture of left orbital floor (HCC) 05/19/2021   S/P craniotomy 11/28/2020   Hypoalbuminemia 09/19/2020   Alcohol abuse, 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  Past  Surgical History:  Procedure Laterality Date   ANKLE SURGERY     HIP SURGERY     KNEE SURGERY     Social History   Occupational History   Not on file  Tobacco Use   Smoking status: Former    Types: Cigarettes   Smokeless tobacco: Never  Vaping Use   Vaping Use: Never used  Substance and Sexual Activity   Alcohol use: Yes    Alcohol/week: 2.0 standard drinks of alcohol    Types: 2 Standard drinks or equivalent per week   Drug use: Not Currently   Sexual activity: Not on file

## 2022-03-15 ENCOUNTER — Other Ambulatory Visit: Payer: Self-pay

## 2022-03-15 ENCOUNTER — Other Ambulatory Visit: Payer: Self-pay | Admitting: Physician Assistant

## 2022-03-15 MED ORDER — OXYCODONE-ACETAMINOPHEN 5-325 MG PO TABS: 1.0000 | ORAL_TABLET | Freq: Four times a day (QID) | ORAL | 0 refills | Status: DC | PRN

## 2022-03-15 MED ORDER — METHOCARBAMOL 500 MG PO TABS: 500.0000 mg | ORAL_TABLET | Freq: Two times a day (BID) | ORAL | 2 refills | Status: DC | PRN

## 2022-03-15 MED ORDER — ONDANSETRON HCL 4 MG PO TABS: 4.0000 mg | ORAL_TABLET | Freq: Three times a day (TID) | ORAL | 0 refills | Status: DC | PRN

## 2022-03-16 ENCOUNTER — Telehealth: Payer: Self-pay | Admitting: Orthopaedic Surgery

## 2022-03-16 ENCOUNTER — Other Ambulatory Visit: Payer: Self-pay | Admitting: Physician Assistant

## 2022-03-16 NOTE — Telephone Encounter (Signed)
Patient called. He would like pain medication sent to [facility] on [address]. His call back number is [phone]

## 2022-03-16 NOTE — Telephone Encounter (Signed)
I sent this in yesterday to that location

## 2022-03-16 NOTE — Telephone Encounter (Signed)
Tried to call patient. No answer. Mailbox is full. °

## 2022-03-18 ENCOUNTER — Encounter (HOSPITAL_COMMUNITY): Payer: Self-pay | Admitting: Orthopaedic Surgery

## 2022-03-18 ENCOUNTER — Other Ambulatory Visit: Payer: Self-pay

## 2022-03-18 MED ORDER — TRANEXAMIC ACID 1000 MG/10ML IV SOLN
2000.0000 mg | INTRAVENOUS | Status: DC
  Filled 2022-03-18: qty 20

## 2022-03-18 NOTE — Anesthesia Preprocedure Evaluation (Addendum)
Anesthesia Evaluation  Patient identified by MRN, date of birth, ID band Patient awake    Reviewed: Allergy & Precautions, NPO status , Patient's Chart, lab work & pertinent test results  Airway Mallampati: III  TM Distance: >3 FB Neck ROM: Limited    Dental  (+) Poor Dentition, Chipped, Missing   Pulmonary asthma , former smoker,     + decreased breath sounds      Cardiovascular hypertension, Pt. on medications Normal cardiovascular exam+ dysrhythmias (take eliquis) Atrial Fibrillation  Rhythm:Regular Rate:Normal     Neuro/Psych  Neuromuscular disease (trigeminal neuralgia right face) negative psych ROS   GI/Hepatic negative GI ROS, (+)     substance abuse (alcohol use in remission)  ,   Endo/Other  diabetes  Renal/GU negative Renal ROS  negative genitourinary   Musculoskeletal  (+) Arthritis , Muscle weakness, wheelchair bound   Abdominal   Peds negative pediatric ROS (+)  Hematology negative hematology ROS (+)   Anesthesia Other Findings   Reproductive/Obstetrics negative OB ROS                          Anesthesia Physical Anesthesia Plan  ASA: 3  Anesthesia Plan: General and Regional   Post-op Pain Management: Tylenol PO (pre-op)*   Induction:   PONV Risk Score and Plan: 2 and Treatment may vary due to age or medical condition, Midazolam, Ondansetron and Dexamethasone  Airway Management Planned: Oral ETT and Video Laryngoscope Planned  Additional Equipment: None  Intra-op Plan:   Post-operative Plan: Extubation in OR  Informed Consent: I have reviewed the patients History and Physical, chart, labs and discussed the procedure including the risks, benefits and alternatives for the proposed anesthesia with the patient or authorized representative who has indicated his/her understanding and acceptance.     Dental advisory given  Plan Discussed with: CRNA and  Anesthesiologist  Anesthesia Plan Comments: (Supraclavicular block. GETA. Tanna Furry, MD    PAT note by Antionette Poles, PA-C:" History of atrial fibrillation , previously followed by cardiologist in [facility], now followed locally by PCP Dr. Wylene Simmer.  Records requested and reviewed from Dr. Lorenza Chick at [facility] in [place], [place].  Per notes, only cardiac disease was A-fib, no other cardiac conditions.  Echo 05/07/2015 showed grossly normal left ventricular systolic function, normal RV size and function, no significant valvular abnormalities.  He is maintained on Eliquis, metoprolol and digoxin prescribed by Dr. Wylene Simmer.  He reports last dose 03/09/2022 in preparation for surgery.  History of trigeminal neuralgia s/p ablation 2013, gamma knife treatment 01/2020, craniotomy with microvascular decompression 09/22/2020 at [facility].  History of EtOH abuse.  Admitted September 2022 for AKI with transaminitis, rhabdomyolysis, confusion felt secondary to EtOH.  Improved with IVFs.  Right upper quadrant ultrasound negative for acute pathology, but did reveal hepatic steatosis.  Patient is wheelchair-bound secondary to weakness/debility.  BMP and CBC from 03/10/2022 reviewed.  Mild hypokalemia with potassium 3.0 (Per ED notes, he was repleted during admission), mild anemia with hemoglobin 12.7, otherwise unremarkable.  Patient will need day of surgery evaluation.  EKG 05/21/2021: Atrial fibrillation.  Rate 68.  LAD.  Right lateral infarct.  TTE 05/07/2015 (copy on chart): Conclusions: 1.  Increased relative wall thickness with normal left ventricular mass index, consistent with concentric left ventricular remodeling. 2.  Endocardium not well visualized; grossly normal left ventricular systolic function. 3.  Normal right ventricular size and function.  )      Anesthesia Quick Evaluation

## 2022-03-18 NOTE — Progress Notes (Signed)
Anesthesia Chart Review: Same day workup  History of atrial fibrillation , previously followed by cardiologist in [facility], now followed locally by PCP Dr. Wylene Simmer.  Records requested and reviewed from Dr. Lorenza Chick at [facility] in [place], [place].  Per notes, only cardiac disease was A-fib, no other cardiac conditions.  Echo 05/07/2015 showed grossly normal left ventricular systolic function, normal RV size and function, no significant valvular abnormalities.  He is maintained on Eliquis, metoprolol and digoxin prescribed by Dr. Wylene Simmer.  He reports last dose 03/09/2022 in preparation for surgery.  History of trigeminal neuralgia s/p ablation 2013, gamma knife treatment 01/2020, craniotomy with microvascular decompression 09/22/2020 at [facility].  History of EtOH abuse.  Admitted September 2022 for AKI with transaminitis, rhabdomyolysis, confusion felt secondary to EtOH.  Improved with IVFs.  Right upper quadrant ultrasound negative for acute pathology, but did reveal hepatic steatosis.  Patient is wheelchair-bound secondary to weakness/debility.  BMP and CBC from 03/10/2022 reviewed.  Mild hypokalemia with potassium 3.0 (Per ED notes, he was repleted during admission), mild anemia with hemoglobin 12.7, otherwise unremarkable.  Patient will need day of surgery evaluation.  EKG 05/21/2021: Atrial fibrillation.  Rate 68.  LAD.  Right lateral infarct.  TTE 05/07/2015 (copy on chart): Conclusions: 1.  Increased relative wall thickness with normal left ventricular mass index, consistent with concentric left ventricular remodeling. 2.  Endocardium not well visualized; grossly normal left ventricular systolic function. 3.  Normal right ventricular size and function.    Zannie Cove [facility] Stay [facility]/Anesthesiology Phone [phone] 03/18/2022 10:18 AM

## 2022-03-18 NOTE — Pre-Procedure Instructions (Addendum)
SDW CALL  Patient was given pre-op instructions over the phone. The opportunity was given for the patient to ask questions. No further questions asked. Patient verbalized understanding of instructions given.   PCP - Dr Guerry Bruin- last office note requested Cardiologist - reports he had cardiologist in [place], but transferred care to Dr. Wylene Simmer when he moved here  PPM/ICD - denies   Chest x-ray - N/A EKG - 9//29/22 Stress Test - years ago in [place]- normal per patient ECHO - years ago in [place]- normal per patient Cardiac Cath - denies  Sleep Study - denies   Blood Thinner Instructions: last dose of Eliquis 7/18 per patient Aspirin Instructions: N/A  ERAS Protcol -Eras Order   COVID TEST- N/A   Anesthesia review: yes, requested any cardiac records, and last office note from PCP.   Patient with no complaints of shortness of breath, fever, cough and chest pain over the phone call    Surgical Instructions    Your procedure is scheduled on Friday, July 28  Report to [facility] Main Entrance "A" at 9:15 A.M., then check in with the Admitting office.  Call this number if you have problems the morning of surgery:  [phone]    Remember:  Do not eat after midnight the night before your surgery  You may drink clear liquids until 9:15AM the morning of your surgery.   Clear liquids allowed are: Water, Non-Citrus Juices (without pulp), Carbonated Beverages, Clear Tea, Black Coffee ONLY (NO MILK, CREAM OR POWDERED CREAMER of any kind), and Gatorade   Take these medicines the morning of surgery with A SIP OF WATER: NONE (takes meds in PM)   Follow your surgeon's instructions on when to stop Eliquis.  If no instructions were given by your surgeon then you will need to call the office to get those instructions.    As of today, STOP taking any Aspirin (unless otherwise instructed by your surgeon) Aleve, Naproxen, Ibuprofen, Motrin, Advil, Goody's, BC's, all herbal medications, fish  oil, and all vitamins.  [facility] is not responsible for any belongings or valuables.     Contacts, glasses, hearing aids, dentures or partials may not be worn into surgery, please bring cases for these belongings   Patients discharged the day of surgery will not be allowed to drive home, and someone needs to stay with them for 24 hours.   SURGICAL WAITING ROOM VISITATION 1 visitor is allowed in pre-op area with patient.  Patients having surgery or a procedure in a hospital may have two support people in the waiting room. Children under the age of 52 must have an adult with them who is not the patient. They may stay in the waiting area during the procedure and may switch out with other visitors. If the patient needs to stay at the hospital during part of their recovery, the visitor guidelines for inpatient rooms apply.  Please refer to the [facility] website for the visitor guidelines for Inpatients (after your surgery is over and you are in a regular room).     Special instructions:    Day of Surgery:  Take a shower the day of or night before with antibacterial soap. Wear Clean/Comfortable clothing the morning of surgery Do not apply any deodorants/lotions.   Do not wear jewelry or makeup Do not wear lotions, powders, perfumes/colognes, or deodorant. Do not shave 48 hours prior to surgery.  Men may shave face and neck. Do not bring valuables to the hospital. Do not wear nail  polish, gel polish, artificial nails, or any other type of covering on natural nails (fingers and toes) If you have artificial nails or gel coating that need to be removed by a nail salon, please have this removed prior to surgery. Artificial nails or gel coating may interfere with anesthesia's ability to adequately monitor your vital signs. Remember to brush your teeth WITH YOUR REGULAR TOOTHPASTE.

## 2022-03-19 ENCOUNTER — Inpatient Hospital Stay (HOSPITAL_COMMUNITY)
Admission: AD | Admit: 2022-03-19 | Discharge: 2022-03-23 | DRG: 512 | Disposition: A | Discharge status: Skilled Nursing Facility | Payer: Medicare Other | Attending: Orthopaedic Surgery | Admitting: Orthopaedic Surgery

## 2022-03-19 ENCOUNTER — Ambulatory Visit (HOSPITAL_COMMUNITY): Payer: Medicare Other | Admitting: Physician Assistant

## 2022-03-19 ENCOUNTER — Other Ambulatory Visit: Payer: Self-pay

## 2022-03-19 ENCOUNTER — Ambulatory Visit (HOSPITAL_COMMUNITY): Payer: Medicare Other

## 2022-03-19 ENCOUNTER — Encounter (HOSPITAL_COMMUNITY): Payer: Self-pay | Admitting: Orthopaedic Surgery

## 2022-03-19 ENCOUNTER — Encounter (HOSPITAL_COMMUNITY)
Admission: AD | Disposition: A | Discharge status: Skilled Nursing Facility | Payer: Self-pay | Source: Home / Self Care | Attending: Orthopaedic Surgery

## 2022-03-19 ENCOUNTER — Ambulatory Visit (HOSPITAL_BASED_OUTPATIENT_CLINIC_OR_DEPARTMENT_OTHER): Payer: Medicare Other | Admitting: Physician Assistant

## 2022-03-19 DIAGNOSIS — I959 Hypotension, unspecified: Secondary | ICD-10-CM | POA: Diagnosis present

## 2022-03-19 DIAGNOSIS — R001 Bradycardia, unspecified: Secondary | ICD-10-CM | POA: Diagnosis present

## 2022-03-19 DIAGNOSIS — W050XXA Fall from non-moving wheelchair, initial encounter: Secondary | ICD-10-CM | POA: Diagnosis present

## 2022-03-19 DIAGNOSIS — Z79899 Other long term (current) drug therapy: Secondary | ICD-10-CM | POA: Diagnosis not present

## 2022-03-19 DIAGNOSIS — Z7901 Long term (current) use of anticoagulants: Secondary | ICD-10-CM | POA: Diagnosis not present

## 2022-03-19 DIAGNOSIS — Y92009 Unspecified place in unspecified non-institutional (private) residence as the place of occurrence of the external cause: Secondary | ICD-10-CM | POA: Diagnosis not present

## 2022-03-19 DIAGNOSIS — I4891 Unspecified atrial fibrillation: Secondary | ICD-10-CM | POA: Diagnosis present

## 2022-03-19 DIAGNOSIS — Z87891 Personal history of nicotine dependence: Secondary | ICD-10-CM

## 2022-03-19 DIAGNOSIS — F1011 Alcohol abuse, in remission: Secondary | ICD-10-CM | POA: Diagnosis present

## 2022-03-19 DIAGNOSIS — Z993 Dependence on wheelchair: Secondary | ICD-10-CM

## 2022-03-19 DIAGNOSIS — I10 Essential (primary) hypertension: Secondary | ICD-10-CM | POA: Diagnosis present

## 2022-03-19 DIAGNOSIS — S43411A Sprain of right coracohumeral (ligament), initial encounter: Secondary | ICD-10-CM | POA: Diagnosis present

## 2022-03-19 DIAGNOSIS — J45909 Unspecified asthma, uncomplicated: Secondary | ICD-10-CM

## 2022-03-19 DIAGNOSIS — Z9889 Other specified postprocedural states: Secondary | ICD-10-CM

## 2022-03-19 DIAGNOSIS — M810 Age-related osteoporosis without current pathological fracture: Secondary | ICD-10-CM | POA: Diagnosis present

## 2022-03-19 DIAGNOSIS — S42411A Displaced simple supracondylar fracture without intercondylar fracture of right humerus, initial encounter for closed fracture: Secondary | ICD-10-CM

## 2022-03-19 HISTORY — DX: Alcohol abuse, in remission: F10.11

## 2022-03-19 HISTORY — PX: APPLICATION OF WOUND VAC: SHX5189

## 2022-03-19 HISTORY — DX: Dependence on wheelchair: Z99.3

## 2022-03-19 HISTORY — DX: Unspecified atrial fibrillation: I48.91

## 2022-03-19 HISTORY — PX: ORIF HUMERUS FRACTURE: SHX2126

## 2022-03-19 HISTORY — DX: Personal history of urinary calculi: Z87.442

## 2022-03-19 HISTORY — DX: Unspecified asthma, uncomplicated: J45.909

## 2022-03-19 SURGERY — OPEN REDUCTION INTERNAL FIXATION (ORIF) DISTAL HUMERUS FRACTURE
Anesthesia: Regional | Site: Elbow | Laterality: Right

## 2022-03-19 MED ORDER — AMISULPRIDE (ANTIEMETIC) 5 MG/2ML IV SOLN: 10.0000 mg | Freq: Once | INTRAVENOUS | Status: DC | PRN

## 2022-03-19 MED ORDER — LIDOCAINE 2% (20 MG/ML) 5 ML SYRINGE
INTRAMUSCULAR | Status: AC
  Filled 2022-03-19: qty 5

## 2022-03-19 MED ORDER — OXYCODONE HCL 5 MG PO TABS
5.0000 mg | ORAL_TABLET | ORAL | Status: DC | PRN
  Administered 2022-03-20 (×4): 10 mg via ORAL
  Filled 2022-03-19 (×7): qty 2

## 2022-03-19 MED ORDER — ALLOPURINOL 300 MG PO TABS
300.0000 mg | ORAL_TABLET | Freq: Every day | ORAL | Status: DC
Start: 2022-03-19 — End: 2022-03-23
  Administered 2022-03-19 – 2022-03-23 (×5): 300 mg via ORAL
  Filled 2022-03-19 (×5): qty 1

## 2022-03-19 MED ORDER — FENTANYL CITRATE (PF) 250 MCG/5ML IJ SOLN
INTRAMUSCULAR | Status: AC
  Filled 2022-03-19: qty 5

## 2022-03-19 MED ORDER — ONDANSETRON HCL 4 MG/2ML IJ SOLN: 4.0000 mg | Freq: Four times a day (QID) | INTRAMUSCULAR | Status: DC | PRN

## 2022-03-19 MED ORDER — SORBITOL 70 % SOLN: 30.0000 mL | Freq: Every day | Status: DC | PRN

## 2022-03-19 MED ORDER — MIDAZOLAM HCL 2 MG/2ML IJ SOLN
INTRAMUSCULAR | Status: AC
  Administered 2022-03-19: 1 mg via INTRAVENOUS
  Filled 2022-03-19: qty 2

## 2022-03-19 MED ORDER — FENTANYL CITRATE (PF) 100 MCG/2ML IJ SOLN: 25.0000 ug | INTRAMUSCULAR | Status: DC | PRN

## 2022-03-19 MED ORDER — PROPOFOL 10 MG/ML IV BOLUS
INTRAVENOUS | Status: DC | PRN
  Administered 2022-03-19: 150 mg via INTRAVENOUS

## 2022-03-19 MED ORDER — FENTANYL CITRATE (PF) 250 MCG/5ML IJ SOLN
INTRAMUSCULAR | Status: DC | PRN
  Administered 2022-03-19: 25 ug via INTRAVENOUS
  Administered 2022-03-19: 50 ug via INTRAVENOUS
  Administered 2022-03-19: 25 ug via INTRAVENOUS

## 2022-03-19 MED ORDER — LACTATED RINGERS IV SOLN: INTRAVENOUS | Status: DC

## 2022-03-19 MED ORDER — ONDANSETRON HCL 4 MG PO TABS: 4.0000 mg | ORAL_TABLET | Freq: Four times a day (QID) | ORAL | Status: DC | PRN

## 2022-03-19 MED ORDER — ROCURONIUM BROMIDE 10 MG/ML (PF) SYRINGE
PREFILLED_SYRINGE | INTRAVENOUS | Status: AC
  Filled 2022-03-19: qty 10

## 2022-03-19 MED ORDER — CEFAZOLIN SODIUM-DEXTROSE 2-4 GM/100ML-% IV SOLN
2.0000 g | INTRAVENOUS | Status: AC
  Administered 2022-03-19: 2 g via INTRAVENOUS
  Filled 2022-03-19: qty 100

## 2022-03-19 MED ORDER — DEXAMETHASONE SODIUM PHOSPHATE 10 MG/ML IJ SOLN
INTRAMUSCULAR | Status: AC
  Filled 2022-03-19: qty 1

## 2022-03-19 MED ORDER — ONDANSETRON HCL 4 MG/2ML IJ SOLN
INTRAMUSCULAR | Status: DC | PRN
  Administered 2022-03-19: 4 mg via INTRAVENOUS

## 2022-03-19 MED ORDER — ACETAMINOPHEN 500 MG PO TABS
1000.0000 mg | ORAL_TABLET | Freq: Once | ORAL | Status: AC
  Administered 2022-03-19: 1000 mg via ORAL
  Filled 2022-03-19: qty 2

## 2022-03-19 MED ORDER — DIPHENHYDRAMINE HCL 12.5 MG/5ML PO ELIX: 25.0000 mg | ORAL_SOLUTION | ORAL | Status: DC | PRN

## 2022-03-19 MED ORDER — DIGOXIN 250 MCG PO TABS
0.2500 mg | ORAL_TABLET | Freq: Every day | ORAL | Status: DC
  Administered 2022-03-20 – 2022-03-22 (×3): 0.25 mg via ORAL
  Filled 2022-03-19 (×4): qty 1

## 2022-03-19 MED ORDER — ONDANSETRON HCL 4 MG/2ML IJ SOLN
INTRAMUSCULAR | Status: AC
  Filled 2022-03-19: qty 2

## 2022-03-19 MED ORDER — TAMSULOSIN HCL 0.4 MG PO CAPS
0.4000 mg | ORAL_CAPSULE | Freq: Every day | ORAL | Status: DC
  Administered 2022-03-19 – 2022-03-23 (×5): 0.4 mg via ORAL
  Filled 2022-03-19 (×5): qty 1

## 2022-03-19 MED ORDER — APIXABAN 5 MG PO TABS: 5.0000 mg | ORAL_TABLET | Freq: Every day | ORAL | Status: DC

## 2022-03-19 MED ORDER — ORAL CARE MOUTH RINSE
15.0000 mL | Freq: Once | OROMUCOSAL | Status: AC
Start: 2022-03-19 — End: 2022-03-19

## 2022-03-19 MED ORDER — OXYCODONE HCL 5 MG/5ML PO SOLN: 5.0000 mg | Freq: Once | ORAL | Status: DC | PRN

## 2022-03-19 MED ORDER — PHENYLEPHRINE HCL-NACL 20-0.9 MG/250ML-% IV SOLN
INTRAVENOUS | Status: DC | PRN
  Administered 2022-03-19: 25 ug/min via INTRAVENOUS

## 2022-03-19 MED ORDER — MIDAZOLAM HCL 2 MG/2ML IJ SOLN: 1.0000 mg | Freq: Once | INTRAMUSCULAR | Status: AC

## 2022-03-19 MED ORDER — METOPROLOL SUCCINATE ER 25 MG PO TB24
25.0000 mg | ORAL_TABLET | Freq: Every day | ORAL | Status: DC
  Administered 2022-03-19 – 2022-03-22 (×4): 25 mg via ORAL
  Filled 2022-03-19 (×5): qty 1

## 2022-03-19 MED ORDER — ACETAMINOPHEN 325 MG PO TABS
325.0000 mg | ORAL_TABLET | Freq: Four times a day (QID) | ORAL | Status: DC | PRN
  Administered 2022-03-21: 650 mg via ORAL
  Filled 2022-03-19: qty 2

## 2022-03-19 MED ORDER — METOCLOPRAMIDE HCL 5 MG/ML IJ SOLN: 5.0000 mg | Freq: Three times a day (TID) | INTRAMUSCULAR | Status: DC | PRN

## 2022-03-19 MED ORDER — TRANEXAMIC ACID-NACL 1000-0.7 MG/100ML-% IV SOLN
1000.0000 mg | Freq: Once | INTRAVENOUS | Status: AC
  Administered 2022-03-19: 1000 mg via INTRAVENOUS
  Filled 2022-03-19: qty 100

## 2022-03-19 MED ORDER — SODIUM CHLORIDE 0.9 % IV SOLN: INTRAVENOUS | Status: DC

## 2022-03-19 MED ORDER — TRANEXAMIC ACID 1000 MG/10ML IV SOLN
INTRAVENOUS | Status: DC | PRN
  Administered 2022-03-19: 2000 mg via TOPICAL

## 2022-03-19 MED ORDER — ROCURONIUM BROMIDE 10 MG/ML (PF) SYRINGE
PREFILLED_SYRINGE | INTRAVENOUS | Status: DC | PRN
  Administered 2022-03-19: 20 mg via INTRAVENOUS
  Administered 2022-03-19: 80 mg via INTRAVENOUS

## 2022-03-19 MED ORDER — FENTANYL CITRATE (PF) 100 MCG/2ML IJ SOLN
INTRAMUSCULAR | Status: AC
  Administered 2022-03-19: 50 ug via INTRAVENOUS
  Filled 2022-03-19: qty 2

## 2022-03-19 MED ORDER — OXYCODONE HCL 5 MG PO TABS
10.0000 mg | ORAL_TABLET | ORAL | Status: DC | PRN
  Administered 2022-03-20: 15 mg via ORAL
  Administered 2022-03-21: 10 mg via ORAL
  Administered 2022-03-21 (×3): 15 mg via ORAL
  Administered 2022-03-22 – 2022-03-23 (×4): 10 mg via ORAL
  Administered 2022-03-23: 15 mg via ORAL
  Filled 2022-03-19: qty 2
  Filled 2022-03-19 (×3): qty 3
  Filled 2022-03-19: qty 2
  Filled 2022-03-19 (×2): qty 3
  Filled 2022-03-19: qty 2
  Filled 2022-03-19: qty 3
  Filled 2022-03-19: qty 2

## 2022-03-19 MED ORDER — FENTANYL CITRATE (PF) 100 MCG/2ML IJ SOLN: 50.0000 ug | Freq: Once | INTRAMUSCULAR | Status: AC

## 2022-03-19 MED ORDER — PROPOFOL 10 MG/ML IV BOLUS
INTRAVENOUS | Status: AC
  Filled 2022-03-19: qty 20

## 2022-03-19 MED ORDER — CEFAZOLIN SODIUM-DEXTROSE 2-4 GM/100ML-% IV SOLN
2.0000 g | Freq: Four times a day (QID) | INTRAVENOUS | Status: AC
  Administered 2022-03-19 – 2022-03-20 (×3): 2 g via INTRAVENOUS
  Filled 2022-03-19 (×3): qty 100

## 2022-03-19 MED ORDER — OXYCODONE HCL 5 MG PO TABS: 5.0000 mg | ORAL_TABLET | Freq: Once | ORAL | Status: DC | PRN

## 2022-03-19 MED ORDER — VENLAFAXINE HCL ER 150 MG PO CP24
150.0000 mg | ORAL_CAPSULE | Freq: Every day | ORAL | Status: DC
  Administered 2022-03-19 – 2022-03-23 (×5): 150 mg via ORAL
  Filled 2022-03-19 (×5): qty 1

## 2022-03-19 MED ORDER — PHENYLEPHRINE 80 MCG/ML (10ML) SYRINGE FOR IV PUSH (FOR BLOOD PRESSURE SUPPORT)
PREFILLED_SYRINGE | INTRAVENOUS | Status: AC
  Filled 2022-03-19: qty 10

## 2022-03-19 MED ORDER — DOCUSATE SODIUM 100 MG PO CAPS
100.0000 mg | ORAL_CAPSULE | Freq: Two times a day (BID) | ORAL | Status: DC
  Administered 2022-03-20 – 2022-03-23 (×7): 100 mg via ORAL
  Filled 2022-03-19 (×8): qty 1

## 2022-03-19 MED ORDER — BUPIVACAINE HCL (PF) 0.25 % IJ SOLN
INTRAMUSCULAR | Status: AC
  Filled 2022-03-19: qty 30

## 2022-03-19 MED ORDER — DEXAMETHASONE SODIUM PHOSPHATE 10 MG/ML IJ SOLN
INTRAMUSCULAR | Status: DC | PRN
  Administered 2022-03-19: 10 mg via INTRAVENOUS

## 2022-03-19 MED ORDER — POLYETHYLENE GLYCOL 3350 17 G PO PACK
17.0000 g | PACK | Freq: Every day | ORAL | Status: DC | PRN
  Administered 2022-03-23: 17 g via ORAL
  Filled 2022-03-19: qty 1

## 2022-03-19 MED ORDER — PHENYLEPHRINE 80 MCG/ML (10ML) SYRINGE FOR IV PUSH (FOR BLOOD PRESSURE SUPPORT)
PREFILLED_SYRINGE | INTRAVENOUS | Status: DC | PRN
  Administered 2022-03-19: 80 ug via INTRAVENOUS

## 2022-03-19 MED ORDER — AMLODIPINE BESYLATE 5 MG PO TABS
5.0000 mg | ORAL_TABLET | Freq: Every day | ORAL | Status: DC
Start: 2022-03-19 — End: 2022-03-23
  Administered 2022-03-19 – 2022-03-23 (×5): 5 mg via ORAL
  Filled 2022-03-19 (×5): qty 1

## 2022-03-19 MED ORDER — LIDOCAINE 2% (20 MG/ML) 5 ML SYRINGE
INTRAMUSCULAR | Status: DC | PRN
  Administered 2022-03-19: 80 mg via INTRAVENOUS

## 2022-03-19 MED ORDER — ACETAMINOPHEN 500 MG PO TABS
1000.0000 mg | ORAL_TABLET | Freq: Four times a day (QID) | ORAL | Status: AC
  Administered 2022-03-19 – 2022-03-20 (×4): 1000 mg via ORAL
  Filled 2022-03-19 (×4): qty 2

## 2022-03-19 MED ORDER — HYDROMORPHONE HCL 1 MG/ML IJ SOLN
0.5000 mg | INTRAMUSCULAR | Status: DC | PRN
  Administered 2022-03-20 – 2022-03-23 (×6): 1 mg via INTRAVENOUS
  Filled 2022-03-19 (×7): qty 1

## 2022-03-19 MED ORDER — ONDANSETRON HCL 4 MG/2ML IJ SOLN: 4.0000 mg | Freq: Once | INTRAMUSCULAR | Status: DC | PRN

## 2022-03-19 MED ORDER — METHOCARBAMOL 500 MG PO TABS
500.0000 mg | ORAL_TABLET | Freq: Four times a day (QID) | ORAL | Status: DC | PRN
  Administered 2022-03-20 – 2022-03-22 (×5): 500 mg via ORAL
  Filled 2022-03-19 (×6): qty 1

## 2022-03-19 MED ORDER — METHOCARBAMOL 1000 MG/10ML IJ SOLN: 500.0000 mg | Freq: Four times a day (QID) | INTRAVENOUS | Status: DC | PRN

## 2022-03-19 MED ORDER — SUGAMMADEX SODIUM 200 MG/2ML IV SOLN
INTRAVENOUS | Status: DC | PRN
  Administered 2022-03-19: 200 mg via INTRAVENOUS

## 2022-03-19 MED ORDER — TRANEXAMIC ACID-NACL 1000-0.7 MG/100ML-% IV SOLN
1000.0000 mg | INTRAVENOUS | Status: AC
Start: 2022-03-19 — End: 2022-03-19
  Administered 2022-03-19: 1000 mg via INTRAVENOUS
  Filled 2022-03-19: qty 100

## 2022-03-19 MED ORDER — APIXABAN 2.5 MG PO TABS
2.5000 mg | ORAL_TABLET | Freq: Two times a day (BID) | ORAL | Status: DC
  Administered 2022-03-20 – 2022-03-23 (×6): 2.5 mg via ORAL
  Filled 2022-03-19 (×7): qty 1

## 2022-03-19 MED ORDER — METOCLOPRAMIDE HCL 5 MG PO TABS: 5.0000 mg | ORAL_TABLET | Freq: Three times a day (TID) | ORAL | Status: DC | PRN

## 2022-03-19 MED ORDER — 0.9 % SODIUM CHLORIDE (POUR BTL) OPTIME
TOPICAL | Status: DC | PRN
  Administered 2022-03-19 (×2): 1000 mL

## 2022-03-19 MED ORDER — CHLORHEXIDINE GLUCONATE 0.12 % MT SOLN
15.0000 mL | Freq: Once | OROMUCOSAL | Status: AC
  Administered 2022-03-19: 15 mL via OROMUCOSAL
  Filled 2022-03-19: qty 15

## 2022-03-19 SURGICAL SUPPLY — 67 items
BAG COUNTER SPONGE SURGICOUNT (BAG) ×3 IMPLANT
BIT DRILL 4.5 TYB 9 (BIT) ×1 IMPLANT
BIT DRILL CALIBRATED 2.7 (BIT) ×1 IMPLANT
BLADE AVERAGE 25X9 (BLADE) ×1 IMPLANT
BNDG ELASTIC 3X5.8 VLCR STR LF (GAUZE/BANDAGES/DRESSINGS) ×1 IMPLANT
BNDG ELASTIC 4X5.8 VLCR STR LF (GAUZE/BANDAGES/DRESSINGS) ×1 IMPLANT
CANISTER SUCT 3000ML PPV (MISCELLANEOUS) IMPLANT
CANISTER WOUND CARE 500ML ATS (WOUND CARE) ×1 IMPLANT
CORD BIPOLAR FORCEPS 12FT (ELECTRODE) ×1 IMPLANT
COVER SURGICAL LIGHT HANDLE (MISCELLANEOUS) ×3 IMPLANT
CUFF TOURN SGL QUICK 18X4 (TOURNIQUET CUFF) ×3 IMPLANT
CUFF TOURN SGL QUICK 24 (TOURNIQUET CUFF)
CUFF TRNQT CYL 24X4X16.5-23 (TOURNIQUET CUFF) IMPLANT
DRAPE C-ARM 42X72 X-RAY (DRAPES) ×3 IMPLANT
DRAPE EXTREMITY T 121X128X90 (DISPOSABLE) ×1 IMPLANT
DRAPE IMP U-DRAPE 54X76 (DRAPES) ×3 IMPLANT
DRAPE INCISE IOBAN 66X45 STRL (DRAPES) ×3 IMPLANT
DRAPE U-SHAPE 47X51 STRL (DRAPES) ×3 IMPLANT
DRESSING PEEL AND PLAC PRVNA20 (GAUZE/BANDAGES/DRESSINGS) IMPLANT
DRSG PEEL AND PLACE PREVENA 20 (GAUZE/BANDAGES/DRESSINGS) ×3
DRSG TEGADERM 4X4.75 (GAUZE/BANDAGES/DRESSINGS) ×12 IMPLANT
ELECT CAUTERY BLADE 6.4 (BLADE) ×3 IMPLANT
ELECT REM PT RETURN 9FT ADLT (ELECTROSURGICAL) ×3
ELECTRODE REM PT RTRN 9FT ADLT (ELECTROSURGICAL) ×2 IMPLANT
GAUZE SPONGE 4X4 12PLY STRL (GAUZE/BANDAGES/DRESSINGS) ×3 IMPLANT
GAUZE XEROFORM 1X8 LF (GAUZE/BANDAGES/DRESSINGS) ×3 IMPLANT
GAUZE XEROFORM 5X9 LF (GAUZE/BANDAGES/DRESSINGS) ×3 IMPLANT
GLOVE BIOGEL PI IND STRL 7.0 (GLOVE) ×2 IMPLANT
GLOVE BIOGEL PI INDICATOR 7.0 (GLOVE) ×1
GLOVE ECLIPSE 7.0 STRL STRAW (GLOVE) ×3 IMPLANT
GLOVE SURG UNDER POLY LF SZ7 (GLOVE) ×57 IMPLANT
GLOVE SURG UNDER POLY LF SZ7.5 (GLOVE) ×24 IMPLANT
GOWN STRL REIN XL XLG (GOWN DISPOSABLE) ×6 IMPLANT
GUIDEWIRE TYB 2X9 (WIRE) ×1 IMPLANT
KIT BASIN OR (CUSTOM PROCEDURE TRAY) ×3 IMPLANT
KIT TURNOVER KIT B (KITS) ×3 IMPLANT
LOOP VESSEL MAXI BLUE (MISCELLANEOUS) ×1 IMPLANT
MANIFOLD NEPTUNE II (INSTRUMENTS) ×3 IMPLANT
NS IRRIG 1000ML POUR BTL (IV SOLUTION) ×3 IMPLANT
PACK SHOULDER (CUSTOM PROCEDURE TRAY) ×3 IMPLANT
PAD ARMBOARD 7.5X6 YLW CONV (MISCELLANEOUS) ×6 IMPLANT
PAD CAST 3X4 CTTN HI CHSV (CAST SUPPLIES) IMPLANT
PAD CAST 4YDX4 CTTN HI CHSV (CAST SUPPLIES) ×2 IMPLANT
PADDING CAST COTTON 3X4 STRL (CAST SUPPLIES) ×1
PADDING CAST COTTON 4X4 STRL (CAST SUPPLIES) ×2
PLATE LOCK RT SM (Plate) ×1 IMPLANT
PLATE LOCK RT SM 74X10.7X3.5X9 (Plate) IMPLANT
SCREW CANN HDLS 6.5X105 (Screw) ×1 IMPLANT
SCREW CORT T15 30X3.5XST LCK (Screw) IMPLANT
SCREW CORTICAL 3.5X30MM (Screw) ×1 IMPLANT
SCREW NON LOCKING LP 3.5 14MM (Screw) ×1 IMPLANT
SLING ARM FOAM STRAP LRG (SOFTGOODS) ×1 IMPLANT
SLING ARM IMMOBILIZER LRG (SOFTGOODS) ×3 IMPLANT
SPONGE T-LAP 18X18 ~~LOC~~+RFID (SPONGE) ×5 IMPLANT
STRIP CLOSURE SKIN 1/2X4 (GAUZE/BANDAGES/DRESSINGS) IMPLANT
SUCTION FRAZIER HANDLE 10FR (MISCELLANEOUS)
SUCTION TUBE FRAZIER 10FR DISP (MISCELLANEOUS) IMPLANT
SUT ETHILON 3 0 PS 1 (SUTURE) ×8 IMPLANT
SUT VIC AB 0 CT1 27 (SUTURE) ×1
SUT VIC AB 0 CT1 27XBRD ANBCTR (SUTURE) ×2 IMPLANT
SUT VIC AB 2-0 CT1 27 (SUTURE) ×3
SUT VIC AB 2-0 CT1 TAPERPNT 27 (SUTURE) ×2 IMPLANT
SYR CONTROL 10ML LL (SYRINGE) IMPLANT
TOWEL GREEN STERILE (TOWEL DISPOSABLE) ×3 IMPLANT
TOWEL GREEN STERILE FF (TOWEL DISPOSABLE) IMPLANT
UNDERPAD 30X36 HEAVY ABSORB (UNDERPADS AND DIAPERS) ×3 IMPLANT
WATER STERILE IRR 1000ML POUR (IV SOLUTION) ×3 IMPLANT

## 2022-03-19 NOTE — Anesthesia Procedure Notes (Signed)
Procedure Name: Intubation Date/Time: 03/19/2022 12:15 PM  Performed by: Elliot Dally, CRNAPre-anesthesia Checklist: Patient identified, Emergency Drugs available, Suction available and Patient being monitored Patient Re-evaluated:Patient Re-evaluated prior to induction Oxygen Delivery Method: Circle System Utilized Preoxygenation: Pre-oxygenation with 100% oxygen Induction Type: IV induction Ventilation: Mask ventilation without difficulty Laryngoscope Size: Miller and 2 Grade View: Grade I Tube type: Oral Tube size: 7.5 mm Number of attempts: 1 Airway Equipment and Method: Stylet and Oral airway Placement Confirmation: ETT inserted through vocal cords under direct vision, positive ETCO2 and breath sounds checked- equal and bilateral Secured at: 22 cm Tube secured with: Tape Dental Injury: Teeth and Oropharynx as per pre-operative assessment

## 2022-03-19 NOTE — Anesthesia Postprocedure Evaluation (Signed)
Anesthesia Post Note  Patient: Taylor Myers  Procedure(s) Performed: OPEN REDUCTION INTERNAL FIXATION (ORIF) RIGHT DISTAL HUMERUS FRACTURE (Right: Elbow) APPLICATION OF WOUND VAC (Right: Arm Upper)     Patient location during evaluation: PACU Anesthesia Type: Regional and General Level of consciousness: awake Pain management: pain level controlled Vital Signs Assessment: post-procedure vital signs reviewed and stable Respiratory status: spontaneous breathing and respiratory function stable Cardiovascular status: stable Postop Assessment: no apparent nausea or vomiting Anesthetic complications: no   No notable events documented.  Last Vitals:  Vitals:   03/19/22 1545 03/19/22 1600  BP: 123/88 135/68  Pulse: 93 86  Resp: 17   Temp:  37.1 C  SpO2: 94% 93%    Last Pain:  Vitals:   03/19/22 1600  TempSrc:   PainSc: 0-No pain                 Mellody Dance

## 2022-03-19 NOTE — Op Note (Signed)
Date of Surgery: 03/19/2022  INDICATIONS: Mr. Taylor Myers is a 71 y.o.-year-old male with a right distal humerus fracture.  The patient did consent to the procedure after discussion of the risks and benefits.  PREOPERATIVE DIAGNOSIS: Right supracondylar distal humerus fracture with intra-articular extension  POSTOPERATIVE DIAGNOSIS: Same.  OPERATIVE FINDINGS: Comminution of trochlea and capitellum fractures Osteoporotic bone Significant bone loss  PROCEDURE:  Neurolysis of right ulnar nerve Resection arthroplasty of right elbow joint Osteotomy and subsequent ORIF of olecranon Application of incisional wound VAC Application of long arm splint  SURGEON: N. Glee Arvin, M.D.  ASSIST: Starlyn Skeans [place], [place]; necessary for the timely completion of procedure and due to complexity of procedure.  ANESTHESIA:  general  IV FLUIDS AND URINE: See anesthesia.  ESTIMATED BLOOD LOSS: minimal mL.  IMPLANTS:  Implant Name Type Inv. Item Serial No. Manufacturer Lot No. LRB No. Used Action  PLATE LOCK RT SM - [id number] Plate PLATE LOCK RT SM  [employer](ORTH,TRAU,BIO,SG)  Right 1 Implanted and Explanted  SCREW NON LOCKING LP 3.5 - [id number] Screw SCREW NON LOCKING LP 3.5  [employer](ORTH,TRAU,BIO,SG)  Right 1 Implanted and Explanted  SCREW CORTICAL 3.5X30MM - [id number] Screw SCREW CORTICAL 3.5X30MM  [employer](ORTH,TRAU,BIO,SG)  Right 1 Implanted and Explanted  6.5 Headless Compression Screw [id number]      Right 1 Implanted    DRAINS: none  COMPLICATIONS: see description of procedure.  DESCRIPTION OF PROCEDURE: The patient was brought to the operating room.  The patient had been signed prior to the procedure and this was documented. The patient had the anesthesia placed by the anesthesiologist.  A time-out was performed to confirm that this was the correct patient, site, side and location. The patient did receive antibiotics prior to the incision and was re-dosed during the  procedure as needed at indicated intervals.  The patient had the operative extremity prepped and draped in the standard surgical fashion.  A sterile tourniquet was placed.      A posterior incision was made over the elbow.  Full-thickness flaps were raised.  Fracture hematoma was evacuated.  The ulnar nerve was located in the expected anatomic position in the medial arm which was then mobilized anteriorly.  Neurolysis was performed.  The ulnar nerve did appear to be fairly contused likely from the recent elbow injury.  After neurolysis we then turned our attention to fixation of the distal humerus fractures.  Given the intra-articular nature of the fracture olecranon osteotomy was first performed.  The triceps along with the olecranon piece was reflected off of the posterior surface of the humerus.  Soft tissues were cleared away from the fracture site.  We immediately encountered significant comminution to the fracture.  The fracture was quite distal as well.  Innumerable attempts at anatomic reduction of the trochlea and the capitellar and lateral column fracture were unsuccessful.  I was able to bring the lateral column capitellar fragment out to relative length and alignment.  Unfortunately bone quality was poor and was not able to achieve any fixation with the screws.  There was also a significant amount of bone loss.  Given these findings I felt that the best option for Mr. Doughtie given the fact that he has a sedentary low demand lifestyle who is a wheelchair ambulator would be a resection arthroplasty.  Recognized that a total elbow replacement was not an option due to the olecranon osteotomy that was previously made.  Therefore the fracture fragments were resected.  The olecranon osteotomy  was repaired with a single 6.5 mm headless compression screw.  Surgical site was thoroughly irrigated with 3 L of normal saline.  Hemostasis was obtained.  Layered closure was performed.  Due to the tenuous soft tissues I  felt the best thing would be to put an incisional VAC as well.  Long-arm splint was then placed with the elbow at 90 degrees.  Patient tolerated procedure well had no immediate complications.  Tessa Lerner was necessary for opening, closing, retracting, limb positioning and overall facilitation and timely completion of the procedure.  POSTOPERATIVE PLAN: Patient will be admitted to the hospital for evaluation by PT and OT and will likely need a short-term stay in a SNF.  Mayra Reel, MD 3:02 PM

## 2022-03-19 NOTE — H&P (Signed)
PREOPERATIVE H&P  Chief Complaint: RIGHT DISTAL HUMERUS FRACTURE  HPI: Taylor Myers is a 71 y.o. male who presents for surgical treatment of RIGHT DISTAL HUMERUS FRACTURE.  He denies any changes in medical history.  Past Medical History:  Diagnosis Date   A-fib (HCC)    Alcohol abuse, in remission    Arthritis    Asthma    child hood asthma   History of kidney stones    "dormant kidney stone"   Hypertension    Wheelchair dependent    Past Surgical History:  Procedure Laterality Date   ANKLE SURGERY Right    HIP SURGERY Right    total replacement   KNEE SURGERY     bilateral replacements   Social History   Socioeconomic History   Marital status: Divorced    Spouse name: Not on file   Number of children: Not on file   Years of education: Not on file   Highest education level: Not on file  Occupational History   Not on file  Tobacco Use   Smoking status: Former    Types: Cigarettes   Smokeless tobacco: Never  Vaping Use   Vaping Use: Never used  Substance and Sexual Activity   Alcohol use: Yes    Alcohol/week: 2.0 standard drinks of alcohol    Types: 2 Standard drinks or equivalent per week    Comment: occasionally   Drug use: Not Currently   Sexual activity: Not on file  Other Topics Concern   Not on file  Social History Narrative   Not on file   Social Determinants of Health   Financial Resource Strain: Not on file  Food Insecurity: Not on file  Transportation Needs: Not on file  Physical Activity: Not on file  Stress: Not on file  Social Connections: Not on file   No family history on file. No Known Allergies Prior to Admission medications   Medication Sig Start Date End Date Taking? Authorizing Provider  allopurinol (ZYLOPRIM) 300 MG tablet Take 300 mg by mouth daily. 04/18/21  Yes [provider]  amLODipine (NORVASC) 5 MG tablet Take 5 mg by mouth daily. 03/18/21  Yes [provider]  ARTIFICIAL TEAR SOLUTION OP Place 1  drop into both eyes daily.   Yes [provider]  atorvastatin (LIPITOR) 40 MG tablet Take 40 mg by mouth daily. 03/18/21  Yes [provider]  DHA-EPA-Flaxseed Oil-Vitamin E (THERATEARS NUTRITION) CAPS Take 1 capsule by mouth daily.   Yes [provider]  digoxin (LANOXIN) 0.25 MG tablet Take 0.25 mg by mouth daily.   Yes [provider]  erythromycin ophthalmic ointment Place 1 Application into both eyes 2 (two) times daily. 01/04/22  Yes [provider]  Menthol, Topical Analgesic, (ICY HOT EX) Apply 1 Application topically daily as needed (pain).   Yes [provider]  metoprolol succinate (TOPROL-XL) 25 MG 24 hr tablet Take 25 mg by mouth daily. 04/18/21  Yes [provider]  Multiple Vitamins-Minerals (LIVER DETOX PO) Take 2 tablets by mouth daily.   Yes [provider]  naproxen sodium (ALEVE) 220 MG tablet Take 440 mg by mouth 2 (two) times daily as needed (pain).   Yes [provider]  OVER THE COUNTER MEDICATION Take 1 tablet by mouth 2 (two) times daily. Beet extract   Yes [provider]  oxybutynin (DITROPAN) 5 MG tablet Take 5 mg by mouth daily. 04/18/21  Yes [provider]  tamsulosin (FLOMAX) 0.4 MG  CAPS capsule Take 0.4 mg by mouth daily.   Yes [provider]  TURMERIC PO Take 1 tablet by mouth daily.   Yes [provider]  venlafaxine XR (EFFEXOR-XR) 150 MG 24 hr capsule Take 150 mg by mouth daily. 04/18/21  Yes [provider]  Vitamin D, Ergocalciferol, (DRISDOL) 1.25 MG (50000 UNIT) CAPS capsule Take 50,000 Units by mouth every Monday.   Yes [provider]  ELIQUIS 5 MG TABS tablet Take 5 mg by mouth daily. 03/09/21   [provider]  methocarbamol (ROBAXIN) 500 MG tablet Take 1 tablet (500 mg total) by mouth 2 (two) times daily as needed. 03/15/22   Cristie Hem, PA-C  ondansetron (ZOFRAN) 4 MG tablet Take 1 tablet (4 mg total) by mouth  every 8 (eight) hours as needed for nausea or vomiting. 03/15/22   Cristie Hem, PA-C  oxyCODONE-acetaminophen (PERCOCET) 5-325 MG tablet Take 1 tablet by mouth every 6 (six) hours as needed. To be taken after surgery 03/15/22   Cristie Hem, PA-C     Positive ROS: All other systems have been reviewed and were otherwise negative with the exception of those mentioned in the HPI and as above.  Physical Exam: General: Alert, no acute distress Cardiovascular: No pedal edema Respiratory: No cyanosis, no use of accessory musculature GI: abdomen soft Skin: No lesions in the area of chief complaint Neurologic: Sensation intact distally Psychiatric: Patient is competent for consent with normal mood and affect Lymphatic: no lymphedema  MUSCULOSKELETAL: exam stable  Assessment: RIGHT DISTAL HUMERUS FRACTURE  Plan: Plan for Procedure(s): OPEN REDUCTION INTERNAL FIXATION (ORIF) RIGHT DISTAL HUMERUS FRACTURE  The risks benefits and alternatives were discussed with the patient including but not limited to the risks of nonoperative treatment, versus surgical intervention including infection, bleeding, nerve injury,  blood clots, cardiopulmonary complications, morbidity, mortality, among others, and they were willing to proceed.   Glee Arvin, MD 03/19/2022 9:39 AM

## 2022-03-19 NOTE — Discharge Instructions (Signed)

## 2022-03-19 NOTE — Transfer of Care (Signed)
Immediate Anesthesia Transfer of Care Note  Patient: Taylor Myers  Procedure(s) Performed: OPEN REDUCTION INTERNAL FIXATION (ORIF) RIGHT DISTAL HUMERUS FRACTURE (Right: Elbow) APPLICATION OF WOUND VAC (Right: Arm Upper)  Patient Location: PACU  Anesthesia Type:General  Level of Consciousness: drowsy  Airway & Oxygen Therapy: Patient Spontanous Breathing  Post-op Assessment: Report given to RN and Post -op Vital signs reviewed and stable  Post vital signs: Reviewed and stable  Last Vitals:  Vitals Value Taken Time  BP 143/77 03/19/22 1530  Temp    Pulse 91 03/19/22 1533  Resp 20 03/19/22 1533  SpO2 95 % 03/19/22 1533  Vitals shown include unvalidated device data.  Last Pain:  Vitals:   03/19/22 1052  TempSrc:   PainSc: 4       Patients Stated Pain Goal: 0 (03/19/22 1052)  Complications: No notable events documented.

## 2022-03-20 LAB — BASIC METABOLIC PANEL
Anion gap: 6 (ref 5–15)
BUN: 10 mg/dL (ref 8–23)
CO2: 27 mmol/L (ref 22–32)
Calcium: 8 mg/dL — ABNORMAL LOW (ref 8.9–10.3)
Chloride: 101 mmol/L (ref 98–111)
Creatinine, Ser: 0.76 mg/dL (ref 0.61–1.24)
GFR, Estimated: >60 mL/min (ref >60)
Glucose, Bld: 231 mg/dL — ABNORMAL HIGH (ref 70–99)
Potassium: 4.4 mmol/L (ref 3.5–5.1)
Sodium: 134 mmol/L — ABNORMAL LOW (ref 135–145)

## 2022-03-20 NOTE — Progress Notes (Signed)
     Subjective: 1 Day Post-Op Procedure(s) (LRB): OPEN REDUCTION INTERNAL FIXATION (ORIF) RIGHT DISTAL HUMERUS FRACTURE (Right) APPLICATION OF WOUND VAC (Right) Awake, alert and oriented x 4. Dressing is dry and intact, oxygen sat monitor is intact right ring finger. Sats 97% Right hand is warm and sensate  Patient reports pain as moderate.    Objective:   VITALS:  Temp:  [97.6 F (36.4 C)-98.7 F (37.1 C)] 98.6 F (37 C) (07/29 0759) Pulse Rate:  [57-93] 57 (07/29 0759) Resp:  [15-19] 16 (07/29 0759) BP: (98-144)/(43-91) 106/72 (07/29 0759) SpO2:  [93 %-98 %] 97 % (07/29 0759) Weight:  [68 kg] 68 kg (07/28 0942)  Neurologically intact ABD soft Neurovascular intact Sensation intact distally Intact pulses distally Incision: dressing C/D/I and no drainage   LABS No results for input(s): "HGB", "WBC", "PLT" in the last 72 hours. Recent Labs    03/20/22 0159  NA 134*  K 4.4  CL 101  CO2 27  BUN 10  CREATININE 0.76  GLUCOSE 231*   No results for input(s): "LABPT", "INR" in the last 72 hours.   Assessment/Plan: 1 Day Post-Op Procedure(s) (LRB): OPEN REDUCTION INTERNAL FIXATION (ORIF) RIGHT DISTAL HUMERUS FRACTURE (Right) APPLICATION OF WOUND VAC (Right)  Advance diet Up with therapy D/C IV fluids Discharge to SNF  Vira Browns 03/20/2022, 9:39 AM Patient ID: Taylor Myers, male   DOB: April 20, 1951, 71 y.o.   MRN: [id number]

## 2022-03-20 NOTE — NC FL2 (Signed)
[place] MEDICAID FL2 LEVEL OF CARE SCREENING TOOL     IDENTIFICATION  Patient Name: Taylor Myers Birthdate: 19-Oct-1950 Sex: male Admission Date (Current Location): 03/19/2022  [place] and [place] Number:  Producer, television/film/video and Address:  [facility] [place], [address] [place], [place] [address]      Provider Number: [id number]  Attending Physician Name and Address:  Tarry Kos, MD  Relative Name and Phone Number:  Dmoni, [facility])   [phone]    Current Level of Care: Hospital Recommended Level of Care: Skilled Nursing Facility Prior Approval Number:    Date Approved/Denied:   PASRR Number: [id number]  Discharge Plan: SNF    Current Diagnoses: Patient Active Problem List   Diagnosis Date Noted   History of open reduction and internal fixation (ORIF) procedure 03/19/2022   Right supracondylar humerus fracture, closed, initial encounter 03/12/2022   Malnutrition of moderate degree 05/21/2021   Malnutrition (HCC) 05/19/2021   Gait difficulty 05/19/2021   Muscle weakness 05/19/2021   Noncompliance with medication regimen 05/19/2021   Stenosis of left carotid artery 05/19/2021   AKI (acute kidney injury) (HCC) 05/19/2021   Elevated LFTs 05/19/2021   Wheelchair bound 05/19/2021   Closed fracture of left orbital floor (HCC) 05/19/2021   S/P craniotomy 11/28/2020   Hypoalbuminemia 09/19/2020   Alcohol abuse, in remission 02/06/2020   Benign prostatic hyperplasia with lower urinary tract symptoms 02/06/2020   Chronic pain 02/06/2020   Essential hypertension 02/06/2020   Long term (current) use of anticoagulants 02/06/2020   Gout 02/06/2020   Asthma without status asthmaticus 02/06/2020   Paroxysmal atrial fibrillation (HCC) 02/06/2020   Type 2 diabetes mellitus without complications (HCC) 02/06/2020   Trigeminal neuralgia of right side of face 02/06/2020    Orientation RESPIRATION BLADDER Height & Weight     Self, Time,  Situation, Place  Normal Continent Weight: 150 lb (68 kg) Height:  5\' 9"  (175.3 cm)  BEHAVIORAL SYMPTOMS/MOOD NEUROLOGICAL BOWEL NUTRITION STATUS      Continent Diet (see d/c summary)  AMBULATORY STATUS COMMUNICATION OF NEEDS Skin   Extensive Assist Verbally Surgical wounds (closed incision R Arm)                       Personal Care Assistance Level of Assistance  Bathing, Feeding, Dressing Bathing Assistance: Limited assistance Feeding assistance: Independent Dressing Assistance: Limited assistance     Functional Limitations Info  Sight, Hearing, Speech Sight Info: Impaired Hearing Info: Adequate      SPECIAL CARE FACTORS FREQUENCY  PT (By licensed PT), OT (By licensed OT)     PT Frequency: 5x/ week OT Frequency: 5x/ week            Contractures Contractures Info: Not present    Additional Factors Info  Code Status, Allergies Code Status Info: Full Allergies Info: NKA           Current Medications (03/20/2022):  This is the current hospital active medication list Current Facility-Administered Medications  Medication Dose Route Frequency Provider Last Rate Last Admin   0.9 %  sodium chloride infusion   Intravenous Continuous 03/22/2022, MD 125 mL/hr at 03/20/22 0139 New Bag at 03/20/22 0139   acetaminophen (TYLENOL) tablet 325-650 mg  325-650 mg Oral Q6H PRN 03/22/22, MD       allopurinol (ZYLOPRIM) tablet 300 mg  300 mg Oral Daily Tarry Kos, MD   300 mg at 03/20/22 1041   amLODipine (  NORVASC) tablet 5 mg  5 mg Oral Daily Tarry Kos, MD   5 mg at 03/20/22 1041   apixaban (ELIQUIS) tablet 2.5 mg  2.5 mg Oral BID Kerrin Champagne, MD       digoxin Margit Banda) tablet 0.25 mg  0.25 mg Oral Daily Tarry Kos, MD   0.25 mg at 03/20/22 1041   diphenhydrAMINE (BENADRYL) 12.5 MG/5ML elixir 25 mg  25 mg Oral Q4H PRN Tarry Kos, MD       docusate sodium (COLACE) capsule 100 mg  100 mg Oral BID Tarry Kos, MD   100 mg at 03/20/22 1040   HYDROmorphone  (DILAUDID) injection 0.5-1 mg  0.5-1 mg Intravenous Q4H PRN Tarry Kos, MD   1 mg at 03/20/22 1228   methocarbamol (ROBAXIN) tablet 500 mg  500 mg Oral Q6H PRN Tarry Kos, MD       Or   methocarbamol (ROBAXIN) 500 mg in dextrose 5 % 50 mL IVPB  500 mg Intravenous Q6H PRN Tarry Kos, MD       metoCLOPramide (REGLAN) tablet 5-10 mg  5-10 mg Oral Q8H PRN Tarry Kos, MD       Or   metoCLOPramide (REGLAN) injection 5-10 mg  5-10 mg Intravenous Q8H PRN Tarry Kos, MD       metoprolol succinate (TOPROL-XL) 24 hr tablet 25 mg  25 mg Oral Daily Tarry Kos, MD   25 mg at 03/20/22 1040   ondansetron (ZOFRAN) tablet 4 mg  4 mg Oral Q6H PRN Tarry Kos, MD       Or   ondansetron [facility]) injection 4 mg  4 mg Intravenous Q6H PRN Tarry Kos, MD       oxyCODONE (Oxy IR/ROXICODONE) immediate release tablet 10-15 mg  10-15 mg Oral Q4H PRN Tarry Kos, MD       oxyCODONE (Oxy IR/ROXICODONE) immediate release tablet 5-10 mg  5-10 mg Oral Q4H PRN Tarry Kos, MD   10 mg at 03/20/22 1041   polyethylene glycol (MIRALAX / GLYCOLAX) packet 17 g  17 g Oral Daily PRN Tarry Kos, MD       sorbitol 70 % solution 30 mL  30 mL Oral Daily PRN Tarry Kos, MD       tamsulosin [facility]) capsule 0.4 mg  0.4 mg Oral Daily Gershon Mussel M, MD   0.4 mg at 03/20/22 1040   venlafaxine XR (EFFEXOR-XR) 24 hr capsule 150 mg  150 mg Oral Daily Tarry Kos, MD   150 mg at 03/20/22 1041     Discharge Medications: Please see discharge summary for a list of discharge medications.  Relevant Imaging Results:  Relevant Lab Results:   Additional Information SSN: [id number]; COVID vax x2;  5'9" 150lbs  Bayla Mcgovern F Aerianna Losey, LCSWA

## 2022-03-20 NOTE — TOC Initial Note (Signed)
Transition of Care [facility]) - Initial/Assessment Note    Patient Details  Name: Taylor Myers MRN: [id number] Date of Birth: October 31, 1950  [facility] CM/SW Contact:    Ralene Bathe, LCSWA Phone Number: 03/20/2022, 1:14 PM  Clinical Narrative:                 CSW received consult for possible SNF placement at time of discharge. CSW spoke with patient at bedside. Patient expressed understanding of PT recommendation and is agreeable to SNF placement at time of discharge. Patient reports preference for a facility in [place] near his home as he lives with his 73 year old father. CSW discussed insurance authorization process and will provide Medicare SNF ratings list. CSW will send out referrals for review and provide bed offers as available.   Skilled Nursing Rehab Facilities-   [email or web address]   Ratings out of 5 stars (the highest)   Name Address  Phone # Quality Care Staffing Health Inspection Overall  [facility] [address] [place] [phone] 5 5 2 4   [facility]  [address], [place] [phone] 4 2 5 5   [facility] [address] [place], [address] [place] 1 1 1 1   [facility] [address] 2 2 4 4   [facility] [address], [phone] 1 1 2 1   [facility] [id number] [address], [phone] 3 2 4 4   [facility] [address] [place] 5 1 3 3   [facility] [address], [facility] [place] 5 2 3 4   [address] ([place]) [address] [facility] 4 1 2 1   [facility] [phone], [phone] 4 1 1 1   [facility] [address], [facility] [phone] 3 1 2 1   [facility] ([place]) [address], [place] [phone] 4 1 1 1   [address] [facility] 4 2 4 4           [facility] [address] [facility]      [facility] [address] [place] [phone] 4 1 3 2   [facility] [place] [address], Eligha Bridegroom [phone] 3 1 5 4   [address] [place] [address], [place] [phone] 1 1 1 1   [facility] [address], [place] [phone] 2 2 3 3           [address] (no [facility]) [address], [place] [phone] 5 5 5 5   [facility] (No Humana) [address] [place] 4 1 4 3   [facility] (No UHC) [address], [place] [phone] 5 5 5 5   [facility] [address] [phone] 2 3 5 5   [facility] [address], [address] 1 1 2 1   [facility] [address] [place] [phone] 3 1 1 1   [place] [address] [place] [phone] 5 2 4 5   [facility]  [address], [place] 2 2 1 1   [facility] [address] [facility] [place] 3 2 1 1   [facility] [address] [place], [address] [place] 2 2 2 2           [facility] [address] [place] [phone] 1 1 1 1   [place] [address], [phone]  [phone] 2 4 2 2   [facility] [address], [address] [phone] 4  2 3 3   [facility] [place] [address], [place] [phone] 2 1 1 1   [facility] (No Humana) [address] 2 2 3 3   [facility] [address], [phone] 2 1 1 1           [facility] [address] [place], [facility] 5 4 5 5   [facility])  [address], [phone] 2 1 2 1   [facility]) [address], [address] [place] 3 1 4 3   [facility] [place] [address] [place] 3 3 4 4   [address] [place], [place] 2 3 1 1   [facility]) [address] [place] [phone] 2 1  4 3      Expected Discharge Plan: Skilled Nursing Facility Barriers to Discharge: Continued Medical Work up, SNF Pending bed offer   Patient Goals and CMS Choice Patient states their goals for this hospitalization and ongoing recovery are:: To go to rehab near his home CMS Medicare.gov Compare Post Acute Care list provided to:: Patient Choice offered to / list presented to : Patient  Expected Discharge Plan and Services Expected Discharge Plan: Skilled Nursing Facility       Living arrangements for the past 2 months: Single Family Home                                      Prior Living Arrangements/Services Living arrangements for the past 2 months: Single Family Home Lives with:: Self, Parents Patient language and need for interpreter reviewed:: Yes Do you feel safe going back to the place where you live?: Yes      Need for Family Participation in Patient Care: Yes (Comment) Care giver support system in place?: No (comment)   Criminal Activity/Legal Involvement Pertinent to Current Situation/Hospitalization: No - Comment as needed  Activities of Daily Living Home Assistive Devices/Equipment: Blood pressure cuff, Walker (specify type), Wheelchair, Lewayne Bunting (specify quad or straight), Eyeglasses, Grab bars in shower, Grab bars around toilet ADL Screening (condition at time of admission) Patient's cognitive ability adequate to safely complete daily activities?: Yes Is the patient deaf or have difficulty hearing?: No Does the patient have difficulty seeing, even when wearing glasses/contacts?: Yes (Bilateral Cataracts) Does the patient have difficulty concentrating, remembering, or making decisions?: No Patient able to express need for assistance with ADLs?: Yes Does the patient have difficulty dressing or bathing?: Yes Independently performs ADLs?: Yes (appropriate for developmental age) Does the patient have difficulty walking or climbing stairs?: Yes Weakness of Legs:  Both Weakness of Arms/Hands: Both  Permission Sought/Granted Permission sought to share information with : Other (comment) ([facility]) Permission granted to share information with : Yes, Verbal Permission Granted     Permission granted to share info w AGENCY: SNFs        Emotional Assessment Appearance:: Appears stated age Attitude/Demeanor/Rapport: Engaged Affect (typically observed): Accepting Orientation: : Oriented to Self, Oriented to Place, Oriented to  Time, Oriented to Situation Alcohol / Substance Use: Alcohol Use (in remission) Psych Involvement: No (comment)  Admission diagnosis:  History of open reduction and internal fixation (ORIF) procedure [Z98.890] Patient Active Problem List   Diagnosis Date Noted   History of open reduction and internal fixation (ORIF) procedure 03/19/2022   Right supracondylar humerus fracture, closed, initial encounter 03/12/2022   Malnutrition  of moderate degree 05/21/2021   Malnutrition (HCC) 05/19/2021   Gait difficulty 05/19/2021   Muscle weakness 05/19/2021   Noncompliance with medication regimen 05/19/2021   Stenosis of left carotid artery 05/19/2021   AKI (acute kidney injury) (HCC) 05/19/2021   Elevated LFTs 05/19/2021   Wheelchair bound 05/19/2021   Closed fracture of left orbital floor (HCC) 05/19/2021   S/P craniotomy 11/28/2020   Hypoalbuminemia 09/19/2020   Alcohol abuse, in remission 02/06/2020   Benign prostatic hyperplasia with lower urinary tract symptoms 02/06/2020   Chronic pain 02/06/2020   Essential hypertension 02/06/2020   Long term (current) use of anticoagulants 02/06/2020   Gout 02/06/2020   Asthma without status asthmaticus 02/06/2020   Paroxysmal atrial fibrillation (HCC) 02/06/2020   Type 2 diabetes mellitus without complications (HCC) 02/06/2020   Trigeminal neuralgia of right side of face 02/06/2020   PCP:  Gaspar Garbe, MD Pharmacy:   [facility] [phone] [place], [place] - [address] AT [facility] [address] & [facility] [address] [place] [address] Phone: [phone] Fax: [phone]  [facility] at [facility] [address] [place] [address] Phone: [phone] Fax: [phone]  [id number] [place],  - [address] AT [facility] OF [address] [place] [address] Phone: [phone] Fax: [phone]     Social Determinants of Health (SDOH) Interventions    Readmission Risk Interventions     No data to display

## 2022-03-20 NOTE — Evaluation (Signed)
Physical Therapy Evaluation Patient Details Name: Taylor Myers MRN: [id number] DOB: Feb 25, 1951 Today's Date: 03/20/2022  History of Present Illness  Pt is a 71yo M admitted after fall from [facility] at home on 7/19. CT showed complex commuinuted trasncondylar and supracondylar fx of R distal humerus. S/p ORIF on 7/28. PMH: afib, ETOH, HTN, R THA, B TKA  Clinical Impression  PTA, pt was modI with manual WC for mobility and independent with all ADLs. Pt reports ability to ambulate 235ft with RW with OPPT. Pt currently requiring minA for bed mobility and min/modA for transfers. Requiring modA +2 for safety/lines for ambulation of 43ft with hemiwalker. Pt lives with 104yo father that is unable to provide any assist at discharge. Pt would benefit from continued acute PT while inpatient to address deficits in strength, balance, and functional mobility. Recommending SNF at discharge due to pts current level of assistance required and lack of available assistance at home.      Recommendations for follow up therapy are one component of a multi-disciplinary discharge planning process, led by the attending physician.  Recommendations may be updated based on patient status, additional functional criteria and insurance authorization.  Follow Up Recommendations Skilled nursing-short term rehab (<3 hours/day) Can patient physically be transported by private vehicle: Yes    Assistance Recommended at Discharge Frequent or constant Supervision/Assistance  Patient can return home with the following  A lot of help with walking and/or transfers;A lot of help with bathing/dressing/bathroom;Assistance with cooking/housework;Assist for transportation;Help with stairs or ramp for entrance    Equipment Recommendations None recommended by PT (Pt equipped)  Recommendations for Other Services  OT consult    Functional Status Assessment Patient has had a recent decline in their functional status and demonstrates the ability to  make significant improvements in function in a reasonable and predictable amount of time.     Precautions / Restrictions Precautions Precautions: Fall Required Braces or Orthoses: Splint/Cast;Sling Splint/Cast: RUE Restrictions Weight Bearing Restrictions: Yes RUE Weight Bearing: Non weight bearing      Mobility  Bed Mobility Overal bed mobility: Needs Assistance Bed Mobility: Supine to Sit     Supine to sit: Min assist, Min guard     General bed mobility comments: minA to achieve long sitting, min guard for safety to pivot to EOB    Transfers Overall transfer level: Needs assistance Equipment used: 1 person hand held assist, Hemi-walker Transfers: Sit to/from Stand, Bed to chair/wheelchair/BSC Sit to Stand: Min assist   Step pivot transfers: Mod assist       General transfer comment: minA for STS from EOB with HHA and modA to take pivotal steps to chair. MinA for STS transfer with hemiwalker    Ambulation/Gait Ambulation/Gait assistance: Mod assist, +2 safety/equipment Gait Distance (Feet): 10 Feet Assistive device: Hemi-walker Gait Pattern/deviations: Step-to pattern, Decreased stride length, Narrow base of support Gait velocity: decreased Gait velocity interpretation: <1.31 ft/sec, indicative of household ambulator   General Gait Details: slow unsteady gait with difficulty negotiating foot placement with hemiwalker. gait belt assist required to maintain balance  Stairs            Wheelchair Mobility    Modified Rankin (Stroke Patients Only)       Balance Overall balance assessment: Needs assistance Sitting-balance support: Feet supported, Single extremity supported Sitting balance-Leahy Scale: Fair     Standing balance support: Single extremity supported Standing balance-Leahy Scale: Poor Standing balance comment: reliant on HHA and gait belt assist to maintain  Pertinent Vitals/Pain Pain  Assessment Pain Assessment: 0-10 Pain Score: 5  Pain Location: LUE Pain Descriptors / Indicators: Discomfort, Grimacing, Operative site guarding Pain Intervention(s): Limited activity within patient's tolerance    Home Living Family/patient expects to be discharged to:: Private residence Living Arrangements: Parent Available Help at Discharge: Neighbor;Friend(s) Type of Home: House Home Access: Ramped entrance       Home Layout: One level Home Equipment: Wheelchair - manual;Grab bars - tub/shower;Grab bars - toilet;Shower seat Additional Comments: Lives with 104yo father, does not provide any physical assist, has neighbor and a former Higher education careers adviser that checks in on him and father    Prior Function Prior Level of Function : Independent/Modified Independent             Mobility Comments: using WC as primary mode of mobility but states he was working with PT and was able to ambulate 255ft with RW       Hand Dominance        Extremity/Trunk Assessment   Upper Extremity Assessment Upper Extremity Assessment: Defer to OT evaluation;LUE deficits/detail    Lower Extremity Assessment Lower Extremity Assessment: Generalized weakness    Cervical / Trunk Assessment Cervical / Trunk Assessment: Kyphotic  Communication   Communication: No difficulties  Cognition Arousal/Alertness: Awake/alert Behavior During Therapy: WFL for tasks assessed/performed Overall Cognitive Status: Within Functional Limits for tasks assessed                                          General Comments      Exercises     Assessment/Plan    PT Assessment Patient needs continued PT services  PT Problem List Decreased strength;Decreased activity tolerance;Decreased balance;Decreased mobility;Decreased knowledge of use of DME;Pain       PT Treatment Interventions DME instruction;Gait training;Functional mobility training;Therapeutic activities;Therapeutic exercise;Balance  training;Patient/family education;Wheelchair mobility training    PT Goals (Current goals can be found in the Care Plan section)  Acute Rehab PT Goals Patient Stated Goal: to return to PLOF PT Goal Formulation: With patient Time For Goal Achievement: 04/03/22 Potential to Achieve Goals: Good    Frequency Min 3X/week     Co-evaluation               AM-PAC PT "6 Clicks" Mobility  Outcome Measure Help needed turning from your back to your side while in a flat bed without using bedrails?: A Little Help needed moving from lying on your back to sitting on the side of a flat bed without using bedrails?: A Little Help needed moving to and from a bed to a chair (including a wheelchair)?: A Lot Help needed standing up from a chair using your arms (e.g., wheelchair or bedside chair)?: A Little Help needed to walk in hospital room?: A Lot Help needed climbing 3-5 steps with a railing? : Total 6 Click Score: 14    End of Session Equipment Utilized During Treatment: Gait belt Activity Tolerance: Patient tolerated treatment well Patient left: in chair;with chair alarm set;with call bell/phone within reach Nurse Communication: Mobility status PT Visit Diagnosis: Unsteadiness on feet (R26.81);Other abnormalities of gait and mobility (R26.89);History of falling (Z91.81);Difficulty in walking, not elsewhere classified (R26.2)    Time: 4401-0272 PT Time Calculation (min) (ACUTE ONLY): 32 min   Charges:   PT Evaluation $PT Eval Moderate Complexity: 1 Mod PT Treatments $Therapeutic Activity: 8-22 mins  Mackie Pai, SPT Acute Rehabilitation Services  Office: [phone]   Mackie Pai 03/20/2022, 9:32 AM

## 2022-03-21 ENCOUNTER — Inpatient Hospital Stay (HOSPITAL_COMMUNITY): Payer: Medicare Other

## 2022-03-21 NOTE — Evaluation (Signed)
Occupational Therapy Evaluation Patient Details Name: Taylor Myers MRN: [id number] DOB: 08-23-1951 Today's Date: 03/21/2022   History of Present Illness Pt is a 71yo M admitted after fall from [facility] at home on 7/19. CT showed complex commuinuted trasncondylar and supracondylar fx of R distal humerus. S/p ORIF on 7/28. PMH: afib, ETOH, HTN, R THA, B TKA   Clinical Impression   Taylor Myers was evaluated s/p the above admission list. He is generally mod I at baseline, mobilizes at wc level, sponge bathes and completes SP surface transfers. Pt does state he is active with OP PT. Upon evaluation pt was limited by RUE immobilization and pain with any movement, generalized weakness and baseline debility. Overall he requires min A for bed mobility and pivotal stepping. He needs increased assist for ADLs due to LUE impairments, requiring max A for LB ADLs and up to mod A for UB ADLs. Pt's LUE sliign is ill fitting due to positioning of splint, Dr. Louanne Skye made aware with report of no major concerns. OT to continue to follow. Recommend d/c to SNF for continued therapy.      Recommendations for follow up therapy are one component of a multi-disciplinary discharge planning process, led by the attending physician.  Recommendations may be updated based on patient status, additional functional criteria and insurance authorization.   Follow Up Recommendations  Skilled nursing-short term rehab (<3 hours/day)    Assistance Recommended at Discharge Frequent or constant Supervision/Assistance  Patient can return home with the following A little help with walking and/or transfers;A lot of help with bathing/dressing/bathroom;Assistance with cooking/housework;Assistance with feeding;Direct supervision/assist for medications management;Direct supervision/assist for financial management;Assist for transportation;Help with stairs or ramp for entrance    Functional Status Assessment  Patient has had a recent decline in their  functional status and demonstrates the ability to make significant improvements in function in a reasonable and predictable amount of time.  Equipment Recommendations  Other (comment) (defer to next venue)    Recommendations for Other Services       Precautions / Restrictions Precautions Precautions: Fall Required Braces or Orthoses: Splint/Cast;Sling Splint/Cast: RUE Restrictions Weight Bearing Restrictions: Yes RUE Weight Bearing: Non weight bearing      Mobility Bed Mobility Overal bed mobility: Needs Assistance Bed Mobility: Supine to Sit     Supine to sit: Min assist          Transfers Overall transfer level: Needs assistance Equipment used: 1 person hand held assist, Hemi-walker Transfers: Sit to/from Stand, Bed to chair/wheelchair/BSC Sit to Stand: Min assist     Step pivot transfers: Min assist     General transfer comment: pivotal/shuffeling steps ~76ft      Balance Overall balance assessment: Needs assistance Sitting-balance support: Feet supported, Single extremity supported Sitting balance-Leahy Scale: Fair     Standing balance support: Single extremity supported Standing balance-Leahy Scale: Poor Standing balance comment: reliant on HHA and gait belt assist to maintain                           ADL either performed or assessed with clinical judgement   ADL Overall ADL's : Needs assistance/impaired Eating/Feeding: Set up;Sitting Eating/Feeding Details (indicate cue type and reason): for packaging Grooming: Set up;Sitting   Upper Body Bathing: Moderate assistance;Sitting   Lower Body Bathing: Maximal assistance;Sit to/from stand   Upper Body Dressing : Moderate assistance;Sitting Upper Body Dressing Details (indicate cue type and reason): educated on LUE first in and last out Lower Body  Dressing: Maximal assistance;Sit to/from stand   Toilet Transfer: Minimal assistance;Stand-pivot;BSC/3in1   Toileting- Clothing Manipulation  and Hygiene: Maximal assistance;Sit to/from stand       Functional mobility during ADLs: Minimal assistance General ADL Comments: Limited by baseline weakness and RUE NWB/immobilization     Vision Baseline Vision/History: 0 No visual deficits Ability to See in Adequate Light: 0 Adequate Vision Assessment?: Vision impaired- to be further tested in functional context Additional Comments: 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 vision is [facility] for tasks assesses, pt confirms no change from baseline. Would benefit from further testing     Perception     Praxis      Pertinent Vitals/Pain Pain Assessment Pain Assessment: Faces Faces Pain Scale: Hurts even more Pain Location: L elbow with movement of LUE Pain Descriptors / Indicators: Discomfort, Grimacing, Operative site guarding Pain Intervention(s): Limited activity within patient's tolerance, Monitored during session     Hand Dominance Right   Extremity/Trunk Assessment Upper Extremity Assessment Upper Extremity Assessment: RUE deficits/detail RUE Deficits / Details: splinted and in sling. limited shoulder ROM due to pain at the elbow with any movement. Available digit ROM seems WFL, sensation is WFL. RUE Sensation: WNL RUE Coordination: decreased fine motor;decreased gross motor LUE Deficits / Details: generalized weakness   Lower Extremity Assessment Lower Extremity Assessment: Defer to PT evaluation   Cervical / Trunk Assessment Cervical / Trunk Assessment: Kyphotic   Communication Communication Communication: No difficulties   Cognition Arousal/Alertness: Awake/alert Behavior During Therapy: WFL for tasks assessed/performed Overall Cognitive Status: Within Functional Limits for tasks assessed                                 General Comments: great insight     General Comments  VSS on RA - messaged Dr. Otelia Sergeant about ill fitting sling    Exercises Exercises: Other exercises Other Exercises Other  Exercises: R hand composite flexion/extension Other Exercises: R shoulder ROM wtihin pain limit   Shoulder Instructions      Home Living Family/patient expects to be discharged to:: Private residence Living Arrangements: Parent Available Help at Discharge: Neighbor;Friend(s) Type of Home: House Home Access: Ramped entrance     Home Layout: One level     Bathroom Shower/Tub: Estate manager/land agent Accessibility: Yes   Home Equipment: Wheelchair - manual;Grab bars - tub/shower;Grab bars - toilet;Shower seat   Additional Comments: Lives with 104yo father, does not provide any physical assist, has neighbor and a former Higher education careers adviser that checks in on him and father      Prior Functioning/Environment Prior Level of Function : Independent/Modified Independent             Mobility Comments: using WC as primary mode of mobility but states he was working with PT and was able to ambulate 231ft with RW ADLs Comments: sponge bathes, SP to toilet from WC. Home is extremely wc accessible        OT Problem List: Decreased strength;Decreased range of motion;Decreased activity tolerance;Impaired balance (sitting and/or standing);Impaired UE functional use;Pain      OT Treatment/Interventions: Self-care/ADL training;Therapeutic exercise;DME and/or AE instruction;Therapeutic activities;Patient/family education;Balance training    OT Goals(Current goals can be found in the care plan section) Acute Rehab OT Goals Patient Stated Goal: home soon OT Goal Formulation: With patient Time For Goal Achievement: 04/04/22 Potential to Achieve Goals: Good ADL Goals Pt Will Perform Grooming: with modified independence;sitting Pt Will  Perform Upper Body Dressing: with modified independence;sitting Pt Will Perform Lower Body Dressing: sitting/lateral leans;with min guard assist;sit to/from stand Pt Will Transfer to Toilet: with modified independence;ambulating;stand pivot transfer;bedside commode Pt  Will Perform Toileting - Clothing Manipulation and hygiene: with modified independence;sitting/lateral leans  OT Frequency: Min 2X/week    Co-evaluation              AM-PAC OT "6 Clicks" Daily Activity     Outcome Measure Help from another person eating meals?: A Little Help from another person taking care of personal grooming?: A Little Help from another person toileting, which includes using toliet, bedpan, or urinal?: A Lot Help from another person bathing (including washing, rinsing, drying)?: A Lot Help from another person to put on and taking off regular upper body clothing?: A Lot Help from another person to put on and taking off regular lower body clothing?: A Lot 6 Click Score: 14   End of Session Equipment Utilized During Treatment: Oxygen Nurse Communication: Mobility status  Activity Tolerance: Patient tolerated treatment well Patient left: in chair;with call bell/phone within reach;with chair alarm set  OT Visit Diagnosis: Unsteadiness on feet (R26.81);Muscle weakness (generalized) (M62.81);Pain                Time: 1002-1026 OT Time Calculation (min): 24 min Charges:  OT General Charges $OT Visit: 1 Visit OT Evaluation $OT Eval Moderate Complexity: 1 Mod OT Treatments $Therapeutic Activity: 8-22 mins    Jewett Mcgann A Lania Zawistowski 03/21/2022, 12:53 PM

## 2022-03-21 NOTE — Progress Notes (Signed)
     Subjective: 2 Days Post-Op Procedure(s) (LRB): OPEN REDUCTION INTERNAL FIXATION (ORIF) RIGHT DISTAL HUMERUS FRACTURE (Right) APPLICATION OF WOUND VAC (Right)  Patient reports pain as moderate.    Objective:   VITALS:  Temp:  [98.3 F (36.8 C)-98.9 F (37.2 C)] 98.3 F (36.8 C) (07/30 1002) Pulse Rate:  [68-79] 72 (07/30 1002) Resp:  [14-18] 16 (07/30 1002) BP: (91-122)/(55-74) 122/70 (07/30 1002) SpO2:  [96 %-98 %] 98 % (07/30 1002)  Neurologically intact ABD soft Neurovascular intact Sensation intact distally Intact pulses distally Dorsiflexion/Plantar flexion intact Incision: dressing C/D/I, no drainage, and Vac is intact PT requests a different sling to reposition the right arm. The current splint and dressing has the elbow at about  70 degrees flexion and a VAC is inplace. VAC is functioning normally. Likely dressing will be change in next day or so and the VAC reassessed, at that time a splint that is a velpeau splint or flexion of the elbow may be increased But acutely the dressing is functioning normally and the patient will not be able to weight bear through the right arm whether the right elbow is flexed further or not. Will leave as is. Patient indicates that he feels some crepitus with slight movement of the right elbow.    LABS No results for input(s): "HGB", "WBC", "PLT" in the last 72 hours. Recent Labs    03/20/22 0159  NA 134*  K 4.4  CL 101  CO2 27  BUN 10  CREATININE 0.76  GLUCOSE 231*   No results for input(s): "LABPT", "INR" in the last 72 hours.   Assessment/Plan: 2 Days Post-Op Procedure(s) (LRB): OPEN REDUCTION INTERNAL FIXATION (ORIF) RIGHT DISTAL HUMERUS FRACTURE (Right) APPLICATION OF WOUND VAC (Right)  Advance diet Up with therapy Discharge to SNF I will order radiographs of the right elbow to assess hardware and the joint itself for any incongruity. SNF is being undertaken due to patient's inablilty to ambulate without  walker and now with inability to use the right arm Increase dependencies on personnell to assist in ambulation.  Vira Browns 03/21/2022, 11:54 AM Patient ID: Taylor Myers, male   DOB: 08/06/1951, 71 y.o.   MRN: [id number]

## 2022-03-22 ENCOUNTER — Encounter (HOSPITAL_COMMUNITY): Payer: Self-pay | Admitting: Orthopaedic Surgery

## 2022-03-22 ENCOUNTER — Other Ambulatory Visit: Payer: Self-pay | Admitting: Physician Assistant

## 2022-03-22 LAB — CBC
HCT: 31.6 % — ABNORMAL LOW (ref 39.0–52.0)
Hemoglobin: 10.4 g/dL — ABNORMAL LOW (ref 13.0–17.0)
MCH: 34 pg (ref 26.0–34.0)
MCHC: 32.9 g/dL (ref 30.0–36.0)
MCV: 103.3 fL — ABNORMAL HIGH (ref 80.0–100.0)
Platelets: 324 10*3/uL (ref 150–400)
RBC: 3.06 MIL/uL — ABNORMAL LOW (ref 4.22–5.81)
RDW: 12.5 % (ref 11.5–15.5)
WBC: 6.7 10*3/uL (ref 4.0–10.5)
nRBC: 0.3 % — ABNORMAL HIGH (ref 0.0–0.2)

## 2022-03-22 MED ORDER — SODIUM CHLORIDE 0.9 % IV BOLUS
500.0000 mL | Freq: Once | INTRAVENOUS | Status: AC
  Administered 2022-03-22: 500 mL via INTRAVENOUS

## 2022-03-22 MED ORDER — OXYCODONE-ACETAMINOPHEN 5-325 MG PO TABS: 1.0000 | ORAL_TABLET | Freq: Four times a day (QID) | ORAL | 0 refills | Status: AC | PRN

## 2022-03-22 NOTE — Significant Event (Addendum)
Rapid Response Event Note   Reason for Call :  Increased right sided weakness per physical therapist  Initial Focused Assessment: Pt on Eliquis daily. Denies headache. LKW unknown. Per PT, he is weaker on his right side today than when he worked with PT yesterday, 03/21/2022, at 1247. Now with concern for increased weakness on his right side, right side numbness and slurred speech.   NIHSS 6, see documentation for details. Patient with disoriented to month, right facial droop, right arm weakness, right decreased sensation to his face, and dysarthria on exam. 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 He endorses his deficits are unchanged. His right arm is weak related to pain and limited mobility s/p ORIF to his right distal humerus.   VS: T 97.70F, BP 89/52 , HR 56, RR 20, SpO2 97% on room air   Interventions:  No intervention from RR RN  Plan of Care:  Due to his unknown last known well and him endorsing these are chronic deficits for him; he is not activated as an acute stroke workup. He is on Eliquis. He is LVO negative on my evaluation.   -Q shift NIHSS recommended -Neurology consult if his symptoms are to deviate from baseline  Call rapid response for additional needs  Event Summary:  MD Notified: Dr. Jacklynn Barnacle, per RN Call Time: 1200  Arrival Time: 1205 End Time: 1225  Jennye Moccasin, RN

## 2022-03-22 NOTE — Progress Notes (Signed)
Physical Therapy Treatment Patient Details Name: Taylor Myers MRN: [id number] DOB: [birth year] Today's Date: 03/22/2022   History of Present Illness Pt is a 71yo M admitted after fall from [facility] at home on 7/19. CT showed complex commuinuted trasncondylar and supracondylar fx of R distal humerus. S/p ORIF on 7/28. PMH: afib, ETOH, HTN, R THA, B TKA    PT Comments    Pt demonstrating decreased tolerance to activity this session and increased lethargy. Pt reporting he did not get much sleep last night. Requiring minA for bed mobility and ModA+2 for transfers. Ambulation deferred this session due to increased unsteadiness and inability to march in place. Noted R lateral lean in sitting in standing and cueing required to maintain NWB on RUE when leaning. RN notified of change in behavior and functional status. PT will continue to follow while inpatient to address deficits in mobility and tolerance to activity.    Recommendations for follow up therapy are one component of a multi-disciplinary discharge planning process, led by the attending physician.  Recommendations may be updated based on patient status, additional functional criteria and insurance authorization.  Follow Up Recommendations  Skilled nursing-short term rehab (<3 hours/day) Can patient physically be transported by private vehicle: Yes   Assistance Recommended at Discharge Frequent or constant Supervision/Assistance  Patient can return home with the following A lot of help with walking and/or transfers;A lot of help with bathing/dressing/bathroom;Assistance with cooking/housework;Assist for transportation;Help with stairs or ramp for entrance   Equipment Recommendations  None recommended by PT    Recommendations for Other Services       Precautions / Restrictions Precautions Precautions: Fall Required Braces or Orthoses: Splint/Cast;Sling Splint/Cast: RUE Restrictions Weight Bearing Restrictions: Yes RUE Weight Bearing: Non  weight bearing     Mobility  Bed Mobility Overal bed mobility: Needs Assistance Bed Mobility: Supine to Sit     Supine to sit: Min assist     General bed mobility comments: minA for trunk assist to sit EOB. Increased time required and multimodal cueing for NWB to RUE    Transfers Overall transfer level: Needs assistance Equipment used: Hemi-walker, 2 person hand held assist Transfers: Sit to/from Stand, Bed to chair/wheelchair/BSC Sit to Stand: Mod assist, +2 physical assistance   Step pivot transfers: Mod assist, +2 physical assistance       General transfer comment: 3 STS trials from EOB with pt requesting to sit back due to feeling unsteady. Step pivot with 2 person HHA to recliner, pt overshot recliner requiring manual assist to maintain balance and correct to sit    Ambulation/Gait                   Stairs             Wheelchair Mobility    Modified Rankin (Stroke Patients Only)       Balance Overall balance assessment: Needs assistance Sitting-balance support: Feet supported, Single extremity supported Sitting balance-Leahy Scale: Fair Sitting balance - Comments: R lateral lean in sitting Postural control: Right lateral lean Standing balance support: Single extremity supported Standing balance-Leahy Scale: Poor Standing balance comment: required +2 support to maintain with R lateral lean                            Cognition Arousal/Alertness: Lethargic Behavior During Therapy: Flat affect Overall Cognitive Status: Impaired/Different from baseline Area of Impairment: Memory, Following commands, Safety/judgement, Awareness, Problem solving  Memory: Decreased short-term memory Following Commands: Follows one step commands with increased time Safety/Judgement: Decreased awareness of safety, Decreased awareness of deficits Awareness: Emergent Problem Solving: Slow processing, Decreased initiation,  Difficulty sequencing General Comments: decreased cognition versus last session. increased time required for command following. stating he feels confused but was oriented.        Exercises      General Comments        Pertinent Vitals/Pain Pain Assessment Pain Assessment: Faces Faces Pain Scale: Hurts little more Pain Location: RUE Pain Descriptors / Indicators: Discomfort, Grimacing, Operative site guarding Pain Intervention(s): Limited activity within patient's tolerance, Monitored during session    Home Living                          Prior Function            PT Goals (current goals can now be found in the care plan section) Acute Rehab PT Goals Patient Stated Goal: to return to PLOF PT Goal Formulation: With patient Time For Goal Achievement: 04/03/22 Potential to Achieve Goals: Good Progress towards PT goals: Progressing toward goals    Frequency    Min 3X/week      PT Plan Current plan remains appropriate    Co-evaluation              AM-PAC PT "6 Clicks" Mobility   Outcome Measure  Help needed turning from your back to your side while in a flat bed without using bedrails?: A Little Help needed moving from lying on your back to sitting on the side of a flat bed without using bedrails?: A Little Help needed moving to and from a bed to a chair (including a wheelchair)?: A Lot Help needed standing up from a chair using your arms (e.g., wheelchair or bedside chair)?: A Lot Help needed to walk in hospital room?: Total Help needed climbing 3-5 steps with a railing? : Total 6 Click Score: 12    End of Session Equipment Utilized During Treatment: Gait belt Activity Tolerance: Patient limited by fatigue;Patient limited by lethargy Patient left: in chair;with call bell/phone within reach;with chair alarm set Nurse Communication: Mobility status PT Visit Diagnosis: Unsteadiness on feet (R26.81);Other abnormalities of gait and mobility  (R26.89);History of falling (Z91.81);Difficulty in walking, not elsewhere classified (R26.2)     Time: 3557-3220 PT Time Calculation (min) (ACUTE ONLY): 28 min  Charges:  $Therapeutic Activity: 23-37 mins                     Davina Poke, SPT Acute Rehabilitation Services  Office: [phone]    Davina Poke 03/22/2022, 1:24 PM

## 2022-03-22 NOTE — Progress Notes (Signed)
Patient has some right sided facial weakness, numbness and slurred speech. 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  Paged Roda Shutters, MD who verified this is patients baseline.

## 2022-03-22 NOTE — Progress Notes (Addendum)
Subjective: 3 Days Post-Op Procedure(s) (LRB): OPEN REDUCTION INTERNAL FIXATION (ORIF) RIGHT DISTAL HUMERUS FRACTURE (Right) APPLICATION OF WOUND VAC (Right) Patient reports pain as mild.  Patient with a history of a-fibb currently on eliquis has not not felt right today and is having a hard time finding his words.  Mentions he did not sleep well last night.  He denies any lightheadedness/dizziness, chest pain, pressure, palpitations.    Objective: Vital signs in last 24 hours: Temp:  [97.5 F (36.4 C)-98.9 F (37.2 C)] 98.9 F (37.2 C) (07/31 1351) Pulse Rate:  [55-64] 64 (07/31 0754) Resp:  [17-22] 17 (07/31 1351) BP: (89-141)/(52-75) 98/59 (07/31 1351) SpO2:  [95 %-98 %] 98 % (07/31 1351)  Intake/Output from previous day: 07/30 0701 - 07/31 0700 In: -  Out: 400 [Urine:400] Intake/Output this shift: Total I/O In: 220 [P.O.:220] Out: 200 [Urine:200]  No results for input(s): "HGB" in the last 72 hours. No results for input(s): "WBC", "RBC", "HCT", "PLT" in the last 72 hours. Recent Labs    03/20/22 0159  NA 134*  K 4.4  CL 101  CO2 27  BUN 10  CREATININE 0.76  GLUCOSE 231*  CALCIUM 8.0*   No results for input(s): "LABPT", "INR" in the last 72 hours.  Neurovascular intact Sensation intact distally RUE- wound vac in place without fluid in canister.  Well fitting splint.  Fingers are warm and well perfused.  Able to wiggle all 5 fingers.    Assessment/Plan: 3 Days Post-Op Procedure(s) (LRB): OPEN REDUCTION INTERNAL FIXATION (ORIF) RIGHT DISTAL HUMERUS FRACTURE (Right) APPLICATION OF WOUND VAC (Right) PLAN RUE- NWB. Continue to elevate for pain and swelling.  Continue with would vac.  Please replace hospital unit for portable prevena DAY of discharge Patient with complaints of "not feeling right and not able to find words"- rapid response called earlier and did not feel "code stroke" was indicated.  Hospitalist has been consulted by Dr. Roda Shutters for further  evaluation. Currently hypotensive and bradycardic.  Stat cbc ordered.  Will order fluid bolus.   F/u with Dr. Roda Shutters 2 weeks post-op      Cristie Hem 03/22/2022, 3:07 PM

## 2022-03-22 NOTE — Care Management Important Message (Signed)
Important Message  Patient Details  Name: Taylor Myers MRN: [id number] Date of Birth: 1951/03/11   Medicare Important Message Given:  Yes     Dorena Bodo 03/22/2022, 2:25 PM

## 2022-03-22 NOTE — Plan of Care (Signed)

## 2022-03-23 NOTE — Progress Notes (Signed)
Report called to Dejah at [facility]. IV removed. Wound vac changed to prevena.

## 2022-03-23 NOTE — TOC Transition Note (Signed)
Transition of Care [facility]) - CM/SW Discharge Note   Patient Details  Name: Taylor Myers MRN: [id number] Date of Birth: [birth year]  [facility] CM/SW Contact:  Geralynn Ochs, LCSW Phone Number: 03/23/2022, 2:58 PM   Clinical Narrative:   CSW noting per chart review that patient discharged. CSW met with patient to discuss SNF offers and patient chose [facility]. CSW confirmed bed availability with [facility] and sent discharge information. CSW discussed changing wound vac to preveena with RN and confirmed change. Transport scheduled with [facility] for next available.  Nurse to call report to [phone], Room 216.    Final next level of care: Skilled Nursing Facility Barriers to Discharge: Barriers Resolved   Patient Goals and CMS Choice Patient states their goals for this hospitalization and ongoing recovery are:: To go to rehab near his home CMS Medicare.gov Compare Post Acute Care list provided to:: Patient Choice offered to / list presented to : Patient  Discharge Placement              Patient chooses bed at: [facility] Patient to be transferred to facility by: [place] Name of family member notified: Self Patient and family notified of of transfer: 03/23/22  Discharge Plan and Services                                     Social Determinants of Health (SDOH) Interventions     Readmission Risk Interventions     No data to display

## 2022-03-23 NOTE — Care Management Important Message (Signed)
Important Message  Patient Details  Name: Taylor Myers MRN: [id number] Date of Birth: 16-Jan-1951   Medicare Important Message Given:  Yes     Sherilyn Banker 03/23/2022, 11:58 AM

## 2022-03-23 NOTE — Progress Notes (Signed)
Patient left the floor via stretcher with [facility]

## 2022-03-23 NOTE — Progress Notes (Signed)
Physical Therapy Treatment Patient Details Name: Taylor Myers MRN: [id number] DOB: 19-Dec-1950 Today's Date: 03/23/2022   History of Present Illness Pt is a 71yo M admitted after fall from [facility] at home on 7/19. CT showed complex commuinuted trasncondylar and supracondylar fx of R distal humerus. S/p ORIF on 7/28. PMH: afib, ETOH, HTN, R THA, B TKA    PT Comments    Pt appearing more alert this session than previously and stating he was able to get rest last night. Requiring modA for bed mobility and minA-modA of 2 for transfers. Pt remains limited by decreased strength and balance. Continuing to have difficulty maintaining NWB status of RUE. PT will continue to follow while inpatient.    Recommendations for follow up therapy are one component of a multi-disciplinary discharge planning process, led by the attending physician.  Recommendations may be updated based on patient status, additional functional criteria and insurance authorization.  Follow Up Recommendations  Skilled nursing-short term rehab (<3 hours/day) Can patient physically be transported by private vehicle: Yes   Assistance Recommended at Discharge Frequent or constant Supervision/Assistance  Patient can return home with the following A lot of help with walking and/or transfers;A lot of help with bathing/dressing/bathroom;Assistance with cooking/housework;Assist for transportation;Help with stairs or ramp for entrance   Equipment Recommendations  None recommended by PT    Recommendations for Other Services       Precautions / Restrictions Precautions Precautions: Fall Required Braces or Orthoses: Splint/Cast;Sling Splint/Cast: RUE Restrictions Weight Bearing Restrictions: Yes RUE Weight Bearing: Non weight bearing     Mobility  Bed Mobility Overal bed mobility: Needs Assistance Bed Mobility: Supine to Sit     Supine to sit: Mod assist     General bed mobility comments: modA for trunk assist to sit EOB,  increased time required and difficulty maintaining NWB    Transfers Overall transfer level: Needs assistance Equipment used: Hemi-walker Transfers: Sit to/from Stand, Bed to chair/wheelchair/BSC Sit to Stand: Min assist, +2 physical assistance   Step pivot transfers: Mod assist, +2 safety/equipment       General transfer comment: minA of 2 from elevated bed to stand. mod of 2 for safety due to poor balance to take steps to recliner. multimodal cueing required to keep pt from overshooting chair. Able to march in place with modA for balance    Ambulation/Gait                   Stairs             Wheelchair Mobility    Modified Rankin (Stroke Patients Only)       Balance Overall balance assessment: Needs assistance Sitting-balance support: Feet supported, Single extremity supported Sitting balance-Leahy Scale: Fair     Standing balance support: Single extremity supported Standing balance-Leahy Scale: Poor Standing balance comment: reliane on hemiwalker and 2 person assist                            Cognition Arousal/Alertness: Awake/alert Behavior During Therapy: Flat affect Overall Cognitive Status: Within Functional Limits for tasks assessed                                 General Comments: appeared more alert and aware this session        Exercises      General Comments        Pertinent Vitals/Pain  Pain Assessment Pain Assessment: Faces Faces Pain Scale: Hurts little more Pain Location: RUE Pain Descriptors / Indicators: Discomfort, Grimacing, Operative site guarding Pain Intervention(s): Limited activity within patient's tolerance, Monitored during session    Home Living                          Prior Function            PT Goals (current goals can now be found in the care plan section) Acute Rehab PT Goals Patient Stated Goal: to return to PLOF PT Goal Formulation: With patient Time For Goal  Achievement: 04/03/22 Potential to Achieve Goals: Good Progress towards PT goals: Progressing toward goals    Frequency    Min 3X/week      PT Plan Current plan remains appropriate    Co-evaluation              AM-PAC PT "6 Clicks" Mobility   Outcome Measure  Help needed turning from your back to your side while in a flat bed without using bedrails?: A Little Help needed moving from lying on your back to sitting on the side of a flat bed without using bedrails?: A Lot Help needed moving to and from a bed to a chair (including a wheelchair)?: A Lot Help needed standing up from a chair using your arms (e.g., wheelchair or bedside chair)?: A Lot Help needed to walk in hospital room?: Total Help needed climbing 3-5 steps with a railing? : Total 6 Click Score: 11    End of Session Equipment Utilized During Treatment: Gait belt Activity Tolerance: Patient tolerated treatment well;Patient limited by fatigue Patient left: in chair;with call bell/phone within reach;with chair alarm set;with nursing/sitter in room Nurse Communication: Mobility status PT Visit Diagnosis: Unsteadiness on feet (R26.81);Other abnormalities of gait and mobility (R26.89);History of falling (Z91.81);Difficulty in walking, not elsewhere classified (R26.2)     Time: 6144-3154 PT Time Calculation (min) (ACUTE ONLY): 14 min  Charges:  $Therapeutic Activity: 8-22 mins                     Davina Poke, SPT Acute Rehabilitation Services  Office: [phone]    Davina Poke 03/23/2022, 11:32 AM

## 2022-03-23 NOTE — Progress Notes (Addendum)
Subjective: 4 Days Post-Op Procedure(s) (LRB): OPEN REDUCTION INTERNAL FIXATION (ORIF) RIGHT DISTAL HUMERUS FRACTURE (Right) APPLICATION OF WOUND VAC (Right) Patient reports pain as mild.  Feeling much better today as he was able to rest last night.  Mind is much more clear.  Vital signs stable  Objective: Vital signs in last 24 hours: Temp:  [97.5 F (36.4 C)-98.9 F (37.2 C)] 98.4 F (36.9 C) (08/01 0446) Pulse Rate:  [64-80] 80 (08/01 0446) Resp:  [14-22] 20 (08/01 0446) BP: (89-141)/(52-80) 103/71 (08/01 0446) SpO2:  [95 %-100 %] 98 % (08/01 0446)  Intake/Output from previous day: 07/31 0701 - 08/01 0700 In: 460 [P.O.:460] Out: 1450 [Urine:1450] Intake/Output this shift: No intake/output data recorded.  Recent Labs    03/22/22 1527  HGB 10.4*   Recent Labs    03/22/22 1527  WBC 6.7  RBC 3.06*  HCT 31.6*  PLT 324   No results for input(s): "NA", "K", "CL", "CO2", "BUN", "CREATININE", "GLUCOSE", "CALCIUM" in the last 72 hours. No results for input(s): "LABPT", "INR" in the last 72 hours.  Neurologically intact Neurovascular intact Sensation intact distally Intact pulses distally Dorsiflexion/Plantar flexion intact Compartment soft RUE- wound vac in place without fluid in canister.  Well fitting splint.  Fingers are warm and well perfused.  Able to wiggle all 5 fingers.   Assessment/Plan: 4 Days Post-Op Procedure(s) (LRB): OPEN REDUCTION INTERNAL FIXATION (ORIF) RIGHT DISTAL HUMERUS FRACTURE (Right) APPLICATION OF WOUND VAC (Right) PLAN Vital signs and cbc are stable RUE- NWB. Continue to elevate for pain and swelling.  Continue with would vac. Please replace hospital unit for portable prevena DAY of discharge D/c to SNF once insurance approves and bed available.  Patient does not feel comfortable going home as he lives with his 36 y/o father who will not be able to provide much support F/u with Dr. Roda Shutters 1 week after discharge if comes before 2 weeks  post-op      Cristie Hem 03/23/2022, 7:35 AM

## 2022-03-23 NOTE — Discharge Summary (Signed)
Patient ID: Taylor Myers MRN: [id number] DOB/AGE: 12-06-1950 71 y.o.  Admit date: 03/19/2022 Discharge date: 03/23/2022  Admission Diagnoses:  Principal Problem:   Right supracondylar humerus fracture, closed, initial encounter Active Problems:   History of open reduction and internal fixation (ORIF) procedure   Discharge Diagnoses:  Same  Past Medical History:  Diagnosis Date   A-fib (HCC)    Alcohol abuse, in remission    Arthritis    Asthma    child [place] asthma   History of kidney stones    "dormant kidney stone"   Hypertension    Wheelchair dependent     Surgeries: Procedure(s): OPEN REDUCTION INTERNAL FIXATION (ORIF) RIGHT DISTAL HUMERUS FRACTURE APPLICATION OF WOUND VAC on 03/19/2022   Consultants:   Discharged Condition: Improved  Hospital Course: Taylor Myers is an 71 y.o. male who was admitted 03/19/2022 for operative treatment ofRight supracondylar humerus fracture, closed, initial encounter. Patient has severe unremitting pain that affects sleep, daily activities, and work/hobbies. After pre-op clearance the patient was taken to the operating room on 03/19/2022 and underwent  Procedure(s): OPEN REDUCTION INTERNAL FIXATION (ORIF) RIGHT DISTAL HUMERUS FRACTURE APPLICATION OF WOUND VAC.    Patient was given perioperative antibiotics:  Anti-infectives (From admission, onward)    Start     Dose/Rate Route Frequency Ordered Stop   03/19/22 1830  ceFAZolin (ANCEF) IVPB 2g/100 mL premix        2 g 200 mL/hr over 30 Minutes Intravenous Every 6 hours 03/19/22 1604 03/20/22 0616   03/19/22 0945  ceFAZolin (ANCEF) IVPB 2g/100 mL premix        2 g 200 mL/hr over 30 Minutes Intravenous On call to O.R. 03/19/22 0935 03/19/22 1220        Patient was given sequential compression devices, early ambulation, and chemoprophylaxis to prevent DVT.  Patient benefited maximally from hospital stay and there were no complications.    Recent vital signs: Patient Vitals for the past  24 hrs:  BP Temp Temp src Pulse Resp SpO2  03/23/22 0446 103/71 98.4 F (36.9 C) -- 80 20 98 %  03/22/22 2350 137/80 98.8 F (37.1 C) Oral 78 14 95 %  03/22/22 1922 130/75 98.3 F (36.8 C) Oral 76 18 100 %  03/22/22 1554 (!) 108/52 -- -- -- 20 --  03/22/22 1351 (!) 98/59 98.9 F (37.2 C) Oral -- 17 98 %  03/22/22 1248 (!) 91/58 98.9 F (37.2 C) Oral -- 20 96 %  03/22/22 1225 (!) 89/52 (!) 97.5 F (36.4 C) Oral -- (!) 22 98 %  03/22/22 1210 -- -- -- -- 19 --  03/22/22 0754 (!) 141/75 98.8 F (37.1 C) Oral 64 18 96 %     Recent laboratory studies:  Recent Labs    03/22/22 1527  WBC 6.7  HGB 10.4*  HCT 31.6*  PLT 324     Discharge Medications:   Allergies as of 03/23/2022   No Known Allergies      Medication List     STOP taking these medications    naproxen sodium 220 MG tablet Commonly known as: ALEVE       TAKE these medications    allopurinol 300 MG tablet Commonly known as: ZYLOPRIM Take 300 mg by mouth daily.   amLODipine 5 MG tablet Commonly known as: NORVASC Take 5 mg by mouth daily.   ARTIFICIAL TEAR SOLUTION OP Place 1 drop into both eyes daily.   atorvastatin 40 MG tablet Commonly known as: LIPITOR Take 40 mg  by mouth daily.   digoxin 0.25 MG tablet Commonly known as: LANOXIN Take 0.25 mg by mouth daily.   Eliquis 5 MG Tabs tablet Generic drug: apixaban Take 5 mg by mouth daily.   erythromycin ophthalmic ointment Place 1 Application into both eyes 2 (two) times daily.   ICY HOT EX Apply 1 Application topically daily as needed (pain).   LIVER DETOX PO Take 2 tablets by mouth daily.   methocarbamol 500 MG tablet Commonly known as: ROBAXIN Take 1 tablet (500 mg total) by mouth 2 (two) times daily as needed.   metoprolol succinate 25 MG 24 hr tablet Commonly known as: TOPROL-XL Take 25 mg by mouth daily.   ondansetron 4 MG tablet Commonly known as: Zofran Take 1 tablet (4 mg total) by mouth every 8 (eight) hours as needed  for nausea or vomiting.   OVER THE COUNTER MEDICATION Take 1 tablet by mouth 2 (two) times daily. Beet extract   oxybutynin 5 MG tablet Commonly known as: DITROPAN Take 5 mg by mouth daily.   oxyCODONE-acetaminophen 5-325 MG tablet Commonly known as: Percocet Take 1-2 tablets by mouth every 6 (six) hours as needed for severe pain. What changed:  how much to take reasons to take this additional instructions   tamsulosin 0.4 MG Caps capsule Commonly known as: FLOMAX Take 0.4 mg by mouth daily.   TheraTears Nutrition Caps Take 1 capsule by mouth daily.   TURMERIC PO Take 1 tablet by mouth daily.   venlafaxine XR 150 MG 24 hr capsule Commonly known as: EFFEXOR-XR Take 150 mg by mouth daily.   Vitamin D (Ergocalciferol) 1.25 MG (50000 UNIT) Caps capsule Commonly known as: DRISDOL Take 50,000 Units by mouth every Monday.               Durable Medical Equipment  (From admission, onward)           Start     Ordered   03/19/22 1605  DME Walker rolling  Once       Question:  Patient needs a walker to treat with the following condition  Answer:  History of open reduction and internal fixation (ORIF) procedure   03/19/22 1604   03/19/22 1605  DME 3 n 1  Once        03/19/22 1604   03/19/22 1605  DME Bedside commode  Once       Question:  Patient needs a bedside commode to treat with the following condition  Answer:  History of open reduction and internal fixation (ORIF) procedure   03/19/22 1604            Diagnostic Studies: DG Elbow 2 Views Right  Result Date: 03/21/2022 CLINICAL DATA:  Right elbow ORIF EXAM: RIGHT ELBOW - 2 VIEW COMPARISON:  03/10/2022 FINDINGS: Partially threaded fixation screw within the proximal ulna traversing nondisplaced intra-articular fracture or osteotomy of the proximal ulna which not well seen on the preoperative exams. Proximal radius intact. Osteotomy of the distal humerus including the capitellum. Bony alignment is near  anatomic. Diffuse soft tissue swelling with air in the soft tissues likely related to prior surgery. Overlying splint material. Advanced atherosclerotic vascular calcifications. IMPRESSION: Status post ORIF and osteotomy of the right elbow in near anatomic alignment. Electronically Signed   By: Davina Poke D.O.   On: 03/21/2022 15:47   DG ELBOW COMPLETE RIGHT (3+VIEW)  Result Date: 03/19/2022 CLINICAL DATA:  Open reduction and internal fixation of the right distal humerus EXAM: RIGHT ELBOW -  COMPLETE 3+ VIEW COMPARISON:  Radiographs and CT from 03/10/2022 FINDINGS: Initial images demonstrate the supracondylar fracture from lateral and frontal projections. Fracture involving the sublime tubercle noted. The final 2 images cannulated screw with distal and shorter proximal threaded margins noted extending axially through the olecranon into the proximal ulnar shaft. There is gas density in the joint and along regions of prior fracture, and possible resection of some of the prior bony fragments, correlate with operative note. IMPRESSION: 1. Longitudinal proximally and distally threaded fixation screw in the olecranon and ulna. 2. Gas density along the original distal humeral fracture sites on the later intraoperative images, possibly with some of the fragments removed. 3. There a fracture of the sublime tubercle. Electronically Signed   By: Gaylyn RongWalter  Liebkemann M.D.   On: 03/19/2022 15:05   DG C-Arm 1-60 Min-No Report  Result Date: 03/19/2022 Fluoroscopy was utilized by the requesting physician.  No radiographic interpretation.   DG C-Arm 1-60 Min-No Report  Result Date: 03/19/2022 Fluoroscopy was utilized by the requesting physician.  No radiographic interpretation.   CT Elbow Right Wo Contrast  Result Date: 03/10/2022 CLINICAL DATA:  Complex elbow fracture. EXAM: CT OF THE UPPER RIGHT EXTREMITY WITHOUT CONTRAST, 3D CT image rendering on workstation TECHNIQUE: Multidetector CT imaging of the upper  right extremity was performed according to the standard protocol. RADIATION DOSE REDUCTION: This exam was performed according to the departmental dose-optimization program which includes automated exposure control, adjustment of the mA and/or kV according to patient size and/or use of iterative reconstruction technique. COMPARISON:  Radiographs, same date. FINDINGS: Bones/Joint/Cartilage Examination is quite limited due to the position of the patent shin and artifact from the patient's body. Complex comminuted transcondylar and supracondylar fracture of the distal humerus. The fracture through the base of the trochlea is comminuted and displaced maximum of 8 mm. The radius and ulna are intact. IMPRESSION: Complex comminuted transcondylar and supracondylar fracture of the distal humerus. Electronically Signed   By: Rudie MeyerP.  Gallerani M.D.   On: 03/10/2022 18:28   CT 3D RECON AT SCANNER  Result Date: 03/10/2022 CLINICAL DATA:  Complex elbow fracture. EXAM: CT OF THE UPPER RIGHT EXTREMITY WITHOUT CONTRAST, 3D CT image rendering on workstation TECHNIQUE: Multidetector CT imaging of the upper right extremity was performed according to the standard protocol. RADIATION DOSE REDUCTION: This exam was performed according to the departmental dose-optimization program which includes automated exposure control, adjustment of the mA and/or kV according to patient size and/or use of iterative reconstruction technique. COMPARISON:  Radiographs, same date. FINDINGS: Bones/Joint/Cartilage Examination is quite limited due to the position of the patent shin and artifact from the patient's body. Complex comminuted transcondylar and supracondylar fracture of the distal humerus. The fracture through the base of the trochlea is comminuted and displaced maximum of 8 mm. The radius and ulna are intact. IMPRESSION: Complex comminuted transcondylar and supracondylar fracture of the distal humerus. Electronically Signed   By: Rudie MeyerP.  Gallerani M.D.    On: 03/10/2022 18:28   CT Head Wo Contrast  Result Date: 03/10/2022 CLINICAL DATA:  Head trauma. EXAM: CT HEAD WITHOUT CONTRAST CT CERVICAL SPINE WITHOUT CONTRAST TECHNIQUE: Multidetector CT imaging of the head and cervical spine was performed following the standard protocol without intravenous contrast. Multiplanar CT image reconstructions of the cervical spine were also generated. RADIATION DOSE REDUCTION: This exam was performed according to the departmental dose-optimization program which includes automated exposure control, adjustment of the mA and/or kV according to patient size and/or use of iterative reconstruction technique.  COMPARISON:  CT scan 03/28/2021 FINDINGS: CT HEAD FINDINGS Brain: Stable age related cerebral atrophy, ventriculomegaly and periventricular white matter disease. No extra-axial fluid collections are identified. No CT findings for acute hemispheric infarction or intracranial hemorrhage. No mass lesions. The brainstem and cerebellum are normal. Vascular: Stable vascular calcifications. No aneurysm or hyperdense vessels. Skull: Remote postsurgical changes in the right occipital area from a prior craniotomy. No acute skull fracture. Sinuses/Orbits: The paranasal sinuses and mastoid air cells are clear. The globes are intact. Other: No scalp lesions or scalp hematoma. Remote left orbital floor fracture. CT CERVICAL SPINE FINDINGS Alignment: Stable degenerative cervical spondylosis with multi level degenerative subluxations. Skull base and vertebrae: Degenerative changes at the skull base C1 and C1-2 articulations. No acute cervical spine fracture is identified. Again demonstrated are large bridging syndesmophytes anteriorly. Soft tissues and spinal canal: No prevertebral fluid or swelling. No visible canal hematoma. Disc levels: Advanced degenerative cervical spondylosis with multilevel disc disease and facet disease but no significant canal stenosis or obvious large acute disc  protrusion. There is a stable large posterior spur at C3-4 with mass effect on the thecal sac. Upper chest: The lung apices are grossly clear. Other: No neck mass or adenopathy. Significant bilateral carotid artery calcifications. IMPRESSION: 1. Stable age related cerebral atrophy, ventriculomegaly and periventricular white matter disease. 2. No acute intracranial findings or skull fracture. 3. Stable degenerative cervical spondylosis with multi level degenerative subluxations. 4. No acute cervical spine fracture. Electronically Signed   By: Marijo Sanes M.D.   On: 03/10/2022 18:20   CT Cervical Spine Wo Contrast  Result Date: 03/10/2022 CLINICAL DATA:  Head trauma. EXAM: CT HEAD WITHOUT CONTRAST CT CERVICAL SPINE WITHOUT CONTRAST TECHNIQUE: Multidetector CT imaging of the head and cervical spine was performed following the standard protocol without intravenous contrast. Multiplanar CT image reconstructions of the cervical spine were also generated. RADIATION DOSE REDUCTION: This exam was performed according to the departmental dose-optimization program which includes automated exposure control, adjustment of the mA and/or kV according to patient size and/or use of iterative reconstruction technique. COMPARISON:  CT scan 03/28/2021 FINDINGS: CT HEAD FINDINGS Brain: Stable age related cerebral atrophy, ventriculomegaly and periventricular white matter disease. No extra-axial fluid collections are identified. No CT findings for acute hemispheric infarction or intracranial hemorrhage. No mass lesions. The brainstem and cerebellum are normal. Vascular: Stable vascular calcifications. No aneurysm or hyperdense vessels. Skull: Remote postsurgical changes in the right occipital area from a prior craniotomy. No acute skull fracture. Sinuses/Orbits: The paranasal sinuses and mastoid air cells are clear. The globes are intact. Other: No scalp lesions or scalp hematoma. Remote left orbital floor fracture. CT CERVICAL  SPINE FINDINGS Alignment: Stable degenerative cervical spondylosis with multi level degenerative subluxations. Skull base and vertebrae: Degenerative changes at the skull base C1 and C1-2 articulations. No acute cervical spine fracture is identified. Again demonstrated are large bridging syndesmophytes anteriorly. Soft tissues and spinal canal: No prevertebral fluid or swelling. No visible canal hematoma. Disc levels: Advanced degenerative cervical spondylosis with multilevel disc disease and facet disease but no significant canal stenosis or obvious large acute disc protrusion. There is a stable large posterior spur at C3-4 with mass effect on the thecal sac. Upper chest: The lung apices are grossly clear. Other: No neck mass or adenopathy. Significant bilateral carotid artery calcifications. IMPRESSION: 1. Stable age related cerebral atrophy, ventriculomegaly and periventricular white matter disease. 2. No acute intracranial findings or skull fracture. 3. Stable degenerative cervical spondylosis with multi level degenerative subluxations.  4. No acute cervical spine fracture. Electronically Signed   By: Rudie Meyer M.D.   On: 03/10/2022 18:20   DG Elbow Complete Right  Result Date: 03/10/2022 CLINICAL DATA:  Limited elbow mobility after fall EXAM: RIGHT ELBOW - COMPLETE 3+ VIEW COMPARISON:  None Available. FINDINGS: Displaced comminuted supracondylar fracture of the distal right humerus likely with intra-articular extension. The distal fracture fragment is displaced anteriorly approximately 9 mm. Cortical irregularity about the superior right radial head is suspicious for additional minimally displaced fracture best appreciated on oblique view. Moderate elbow joint effusion. Subcutaneous soft tissue calcifications posterior to the ulna. Vascular calcifications. IMPRESSION: 1. Displaced comminuted supracondylar fracture of the distal right humerus likely with intra articular extension. 2. Possible minimally  displaced fracture of the superior radial head. Electronically Signed   By: Minerva Fester M.D.   On: 03/10/2022 16:41    Disposition: Discharge disposition: 03-Skilled Nursing Facility          Follow-up Information     Tarry Kos, MD Follow up in 1 week(s).   Specialty: Orthopedic Surgery Why: For suture removal, For wound re-check Contact information: [address] [place] [address] [phone]                  Signed: Cristie Hem 03/23/2022, 7:41 AM

## 2022-03-30 ENCOUNTER — Ambulatory Visit (INDEPENDENT_AMBULATORY_CARE_PROVIDER_SITE_OTHER): Payer: Medicare Other

## 2022-03-30 ENCOUNTER — Other Ambulatory Visit: Payer: Self-pay | Admitting: *Deleted

## 2022-03-30 ENCOUNTER — Ambulatory Visit (INDEPENDENT_AMBULATORY_CARE_PROVIDER_SITE_OTHER): Payer: Medicare Other | Admitting: Physician Assistant

## 2022-03-30 DIAGNOSIS — Z8781 Personal history of (healed) traumatic fracture: Secondary | ICD-10-CM

## 2022-03-30 DIAGNOSIS — Z9889 Other specified postprocedural states: Secondary | ICD-10-CM

## 2022-03-30 NOTE — Patient Outreach (Signed)
Mr. Mcadory resides in [facility]. Screening for potential [facility] care coordination/care management needs as a benefit of Mr. Hammerschmidt insurance plan and PCP.   Facility site visit to [facility]. Met with Myiah, SNF SW to discuss transition plans. Mr. Gill plans to return home with his father. Mr. Naclerio participating with therapy. Goal is for independence.  Will continue to follow while Mr. Doyel resides in SNF.    Marthenia Rolling, MSN, RN,BSN [place] Acute Care Coordinator [phone] [facility]) [phone]  (Toll free office)

## 2022-03-30 NOTE — Progress Notes (Signed)
Post-Op Visit Note   Patient: Taylor Myers           Date of Birth: 1950-11-02           MRN: [id number] Visit Date: 03/30/2022 PCP: Gaspar Garbe, MD   Assessment & Plan:  Chief Complaint:  Chief Complaint  Patient presents with   Right Arm - Routine Post Op   Visit Diagnoses:  1. S/P ORIF (open reduction internal fixation) fracture     Plan: Patient is a pleasant 71 year old gentleman who comes in today approximately 11 days status right elbow resection arthroplasty, date of surgery 03/19/2022.  He has been in minimal pain.  He has been compliant wearing his sling and wound VAC.  He is currently at a skilled nursing facility.  He does note he is having some difficulty with transfers.  Examination right elbow reveals a well-healing surgical incision with nylon sutures in place.  No evidence of infection or cellulitis.  Fingers warm well-perfused.  He is neurovascular intact distally.  Today, his sutures were removed and Steri-Strips applied.  He will continue wearing his sling we have also provided him with a [employer] prescription for a hinged elbow brace unlocked.  He will follow-up with [place] next week for suture removal.  Call with concerns or questions in the meantime. Follow-Up Instructions: Return in about 1 week (around 04/06/2022) for no acute interim changes .   Orders:  Orders Placed This Encounter  Procedures   XR Elbow 2 Views Right   No orders of the defined types were placed in this encounter.   Imaging: No results found.  PMFS History: Patient Active Problem List   Diagnosis Date Noted   History of open reduction and internal fixation (ORIF) procedure 03/19/2022   Right supracondylar humerus fracture, closed, initial encounter 03/12/2022   Malnutrition of moderate degree 05/21/2021   Malnutrition (HCC) 05/19/2021   Gait difficulty 05/19/2021   Muscle weakness 05/19/2021   Noncompliance with medication regimen 05/19/2021   Stenosis of left carotid artery  05/19/2021   AKI (acute kidney injury) (HCC) 05/19/2021   Elevated LFTs 05/19/2021   Wheelchair bound 05/19/2021   Closed fracture of left orbital floor (HCC) 05/19/2021   S/P craniotomy 11/28/2020   Hypoalbuminemia 09/19/2020   Alcohol abuse, in remission 02/06/2020   Benign prostatic hyperplasia with lower urinary tract symptoms 02/06/2020   Chronic pain 02/06/2020   Essential hypertension 02/06/2020   Long term (current) use of anticoagulants 02/06/2020   Gout 02/06/2020   Asthma without status asthmaticus 02/06/2020   Paroxysmal atrial fibrillation (HCC) 02/06/2020   Type 2 diabetes mellitus without complications (HCC) 02/06/2020   Trigeminal neuralgia of right side of face 02/06/2020   Past Medical History:  Diagnosis Date   A-fib (HCC)    Alcohol abuse, in remission    Arthritis    Asthma    [place] asthma   History of kidney stones    "dormant kidney stone"   Hypertension    Wheelchair dependent     No family history on file.  Past Surgical History:  Procedure Laterality Date   ANKLE SURGERY Right    APPLICATION OF WOUND VAC Right 03/19/2022   Procedure: APPLICATION OF WOUND VAC;  Surgeon: Tarry Kos, MD;  Location: [facility] OR;  Service: Orthopedics;  Laterality: Right;   HIP SURGERY Right    total replacement   KNEE SURGERY     bilateral replacements   ORIF HUMERUS FRACTURE Right 03/19/2022   Procedure: OPEN REDUCTION  INTERNAL FIXATION (ORIF) RIGHT DISTAL HUMERUS FRACTURE;  Surgeon: Tarry Kos, MD;  Location: [facility] OR;  Service: Orthopedics;  Laterality: Right;   Social History   Occupational History   Not on file  Tobacco Use   Smoking status: Former    Types: Cigarettes   Smokeless tobacco: Never  Vaping Use   Vaping Use: Never used  Substance and Sexual Activity   Alcohol use: Yes    Alcohol/week: 2.0 standard drinks of alcohol    Types: 2 Standard drinks or equivalent per week    Comment: occasionally   Drug use: Not Currently   Sexual activity:  Not on file

## 2022-04-06 ENCOUNTER — Ambulatory Visit (INDEPENDENT_AMBULATORY_CARE_PROVIDER_SITE_OTHER): Payer: Medicare Other | Admitting: Physician Assistant

## 2022-04-06 ENCOUNTER — Other Ambulatory Visit: Payer: Self-pay | Admitting: *Deleted

## 2022-04-06 DIAGNOSIS — S42411A Displaced simple supracondylar fracture without intercondylar fracture of right humerus, initial encounter for closed fracture: Secondary | ICD-10-CM

## 2022-04-06 NOTE — Progress Notes (Signed)
Post-Op Visit Note   Patient: Taylor Myers           Date of Birth: August 30, 1950           MRN: [id number] Visit Date: 04/06/2022 PCP: Gaspar Garbe, MD   Assessment & Plan:  Chief Complaint:  Chief Complaint  Patient presents with   Right Elbow - Routine Post Op   Visit Diagnoses:  1. Right supracondylar humerus fracture, closed, initial encounter     Plan: Patient is a pleasant 71 year old gentleman who comes in today almost 3 weeks status post right elbow resection arthroplasty, date of surgery 03/19/2022.  He has been doing well.  He is still in a rehab facility.  He was fitted for his tinged elbow brace but is not scheduled to pick this up until September 8.  In the meantime, he has been wearing his sling.  Is examination of the right elbow reveals a well-healed surgical incision with nylon sutures in place.  No evidence of infection or cellulitis.  Fingers are warm well perfused.  He is neurovascular intact distally.  Today, sutures were removed and Steri-Strips applied.  I discussed that he may come out of his sling when he is at rest to work on gentle range of motion as tolerated.  He will pick up the hinged elbow brace when it is ready.  He will follow-up with [place] in 3 weeks time for recheck.  Call with concerns or questions in the meantime.  Follow-Up Instructions: Return in about 3 weeks (around 04/27/2022).   Orders:  No orders of the defined types were placed in this encounter.  No orders of the defined types were placed in this encounter.   Imaging: No new imaging  PMFS History: Patient Active Problem List   Diagnosis Date Noted   History of open reduction and internal fixation (ORIF) procedure 03/19/2022   Right supracondylar humerus fracture, closed, initial encounter 03/12/2022   Malnutrition of moderate degree 05/21/2021   Malnutrition (HCC) 05/19/2021   Gait difficulty 05/19/2021   Muscle weakness 05/19/2021   Noncompliance with medication regimen  05/19/2021   Stenosis of left carotid artery 05/19/2021   AKI (acute kidney injury) (HCC) 05/19/2021   Elevated LFTs 05/19/2021   Wheelchair bound 05/19/2021   Closed fracture of left orbital floor (HCC) 05/19/2021   S/P craniotomy 11/28/2020   Hypoalbuminemia 09/19/2020   Alcohol abuse, in remission 02/06/2020   Benign prostatic hyperplasia with lower urinary tract symptoms 02/06/2020   Chronic pain 02/06/2020   Essential hypertension 02/06/2020   Long term (current) use of anticoagulants 02/06/2020   Gout 02/06/2020   Asthma without status asthmaticus 02/06/2020   Paroxysmal atrial fibrillation (HCC) 02/06/2020   Type 2 diabetes mellitus without complications (HCC) 02/06/2020   Trigeminal neuralgia of right side of face 02/06/2020   Past Medical History:  Diagnosis Date   A-fib (HCC)    Alcohol abuse, in remission    Arthritis    Asthma    child hood asthma   History of kidney stones    "dormant kidney stone"   Hypertension    Wheelchair dependent     No family history on file.  Past Surgical History:  Procedure Laterality Date   ANKLE SURGERY Right    APPLICATION OF WOUND VAC Right 03/19/2022   Procedure: APPLICATION OF WOUND VAC;  Surgeon: Tarry Kos, MD;  [facility];  Service: Orthopedics;  Laterality: Right;   HIP SURGERY Right    total replacement  KNEE SURGERY     bilateral replacements   ORIF HUMERUS FRACTURE Right 03/19/2022   Procedure: OPEN REDUCTION INTERNAL FIXATION (ORIF) RIGHT DISTAL HUMERUS FRACTURE;  Surgeon: Tarry Kos, MD;  Location: [facility];  Service: Orthopedics;  Laterality: Right;   Social History   Occupational History   Not on file  Tobacco Use   Smoking status: Former    Types: Cigarettes   Smokeless tobacco: Never  Vaping Use   Vaping Use: Never used  Substance and Sexual Activity   Alcohol use: Yes    Alcohol/week: 2.0 standard drinks of alcohol    Types: 2 Standard drinks or equivalent per week    Comment: occasionally    Drug use: Not Currently   Sexual activity: Not on file

## 2022-04-06 NOTE — Patient Outreach (Signed)
[facility] Post-Acute Care Coordinator follow up. Mr. Rosol resides in [facility].  Screening for potential [place] care coordination/care management needs as a benefit of insurance plan and PCP.  Facility site visit to [facility]. Collaboration with Myiah, SNF SW and Ana, therapy manager to discuss progression and transition plans. Mr. Scully plans to return home with his father post SNF. Has necessary DME at home.   Went to visit Mr. Renstrom at bedside. However, he was off the unit. Left [facility] Care Management brochure, 24-hr nurse advice line magnet, and writer's contact information placed on bedside table.   Will continue to follow.    Raiford Noble, MSN, RN,BSN [facility] Post Acute Care Coordinator [phone] [facility]) [phone]  (Toll free office)

## 2022-04-13 ENCOUNTER — Other Ambulatory Visit: Payer: Self-pay | Admitting: *Deleted

## 2022-04-13 NOTE — Patient Outreach (Signed)
[place] Coordinator follow up.   Mr. Patriarca remains in [facility]. Met with Wilhemena Durie, Brewing technologist who reports Mr. Yurchak will transition home on 04/14/22. Lives with 73 year old father. Will have home health.   Met with Mr. Stadler at bedside to discuss [facility] follow up. Mr. Goyne is agreeable. Confirms PCP is Dr. Osborne Casco with [facility]. Confirmed best contact number for Mr. Chaloux is [phone]. States friends/neighbors and use of cab for transportation assistance. Mr. Pagliuca reports he is anxious to return home.   Will plan to make referral to [facility] care coordination team upon SNF discharge.    Marthenia Rolling, MSN, RN,BSN [place] Acute Care Coordinator [phone] [facility]) [phone]  (Toll free office)

## 2022-04-16 ENCOUNTER — Other Ambulatory Visit: Payer: Self-pay | Admitting: *Deleted

## 2022-04-16 DIAGNOSIS — I10 Essential (primary) hypertension: Secondary | ICD-10-CM

## 2022-04-16 NOTE — Patient Outreach (Signed)
[facility] Post-Acute Care Coordinator follow up.   Mr. Taylor Myers was scheduled to discharge from [facility] on 04/14/22. Mr. Taylor Myers previously agreed to [facility] services during bedside visit on 04/13/22.   Will make referral to [facility] RNCM for care coordination.  Mr. Taylor Myers has medical history of HTN, atrial fib, s/p ORIF right distal humerus fracture.  Confirmed best contact number for Mr. Taylor Myers as [phone].  Raiford Noble, MSN, RN,BSN [facility] Post Acute Care Coordinator [phone] [facility]) [phone]  (Toll free office)

## 2022-04-19 ENCOUNTER — Telehealth: Payer: Self-pay | Admitting: *Deleted

## 2022-04-19 ENCOUNTER — Ambulatory Visit: Payer: Self-pay

## 2022-04-19 ENCOUNTER — Telehealth: Payer: Self-pay | Admitting: Orthopaedic Surgery

## 2022-04-19 NOTE — Patient Outreach (Signed)
  Care Coordination   04/19/2022 Name: Taylor Myers MRN: [id number] DOB: 1950/11/19   Care Coordination Outreach Attempts:  An unsuccessful telephone outreach was attempted today to offer the patient information about available care coordination services as a benefit of their health plan.   Follow Up Plan:  Additional outreach attempts will be made to offer the patient care coordination information and services.   Encounter Outcome:  No Answer  Care Coordination Interventions Activated:  No   Care Coordination Interventions:  No, not indicated    Delsa Sale, RN, BSN, CCM Care Management Coordinator [facility] Care Management Direct Phone: [phone]

## 2022-04-19 NOTE — Telephone Encounter (Signed)
Until he f/u with me

## 2022-04-19 NOTE — Telephone Encounter (Signed)
Received call from Jim Hoffman-(PT) with [facility] needing to know if patient has an weight baring or mobility restrictions? The number to contact Rosanne Ashing is [phone]

## 2022-04-19 NOTE — Chronic Care Management (AMB) (Signed)
  Care Coordination   Note   04/19/2022 Name: Taylor Myers MRN: [id number] DOB: 19-Jul-1951  Taylor Myers is a 71 y.o. year old male who sees Tisovec, Adelfa Koh, MD for primary care. I reached out to [name] by phone today to offer care coordination services for a referral to [facility].  Taylor Myers was given information about Care Coordination services today including:   The Care Coordination services include support from the care team which includes your Nurse Coordinator, Clinical Social Worker, or Pharmacist.  The Care Coordination team is here to help remove barriers to the health concerns and goals most important to you. Care Coordination services are voluntary, and the patient may decline or stop services at any time by request to their care team member.   Care Coordination Consent Status: Patient agreed to services and verbal consent obtained.   Follow up plan:  Telephone appointment with care coordination team member scheduled for:  04/19/22  Encounter Outcome:  Pt. Scheduled  [facility] Coordination Care Guide  Direct Dial: [phone]

## 2022-04-19 NOTE — Telephone Encounter (Signed)
ROM as tolerated.  Non weight bearing.

## 2022-04-20 NOTE — Telephone Encounter (Signed)
Called and left information on his confidential voicemail.

## 2022-04-23 ENCOUNTER — Telehealth: Payer: Self-pay | Admitting: *Deleted

## 2022-04-23 NOTE — Chronic Care Management (AMB) (Unsigned)
  Care Coordination  Outreach Note  04/23/2022 Name: Taylor Myers MRN: [id number] DOB: [birth year]   Care Coordination Outreach Attempts  A second unsuccessful outreach was attempted today to offer the patient with information about available care coordination services as a benefit of their health plan.     Reschedule initial referral call with [facility]   Follow Up Plan:  Additional outreach attempts will be made to offer the patient care coordination information and services.   Encounter Outcome:  No Answer  Christie Nottingham  Care Coordination Care Guide  Direct Dial: [phone]

## 2022-04-27 NOTE — Chronic Care Management (AMB) (Signed)
  Care Coordination  Outreach Note  04/27/2022 Name: Carman Essick MRN: [id number] DOB: 05-Dec-1950   Care Coordination Outreach Attempts  A third unsuccessful outreach was attempted today to offer the patient with information about available care coordination services as a benefit of their health plan.   Follow Up Plan:  No further outreach attempts will be made at this time. We have been unable to contact the patient to offer or enroll patient in care coordination services  Encounter Outcome:  No Answer  Gwenevere Ghazi  Care Coordination Care Guide  Direct Dial: [phone]

## 2022-04-28 ENCOUNTER — Ambulatory Visit (INDEPENDENT_AMBULATORY_CARE_PROVIDER_SITE_OTHER): Payer: Medicare Other

## 2022-04-28 ENCOUNTER — Encounter: Payer: Self-pay | Admitting: Orthopaedic Surgery

## 2022-04-28 ENCOUNTER — Ambulatory Visit (INDEPENDENT_AMBULATORY_CARE_PROVIDER_SITE_OTHER): Payer: Medicare Other | Admitting: Orthopaedic Surgery

## 2022-04-28 ENCOUNTER — Encounter: Payer: 59 | Admitting: Orthopaedic Surgery

## 2022-04-28 DIAGNOSIS — Z8781 Personal history of (healed) traumatic fracture: Secondary | ICD-10-CM

## 2022-04-28 DIAGNOSIS — Z9889 Other specified postprocedural states: Secondary | ICD-10-CM

## 2022-04-28 NOTE — Progress Notes (Signed)
Post-Op Visit Note   Patient: Taylor Myers           Date of Birth: Nov 04, 1950           MRN: [id number] Visit Date: 04/28/2022 PCP: Gaspar Garbe, MD   Assessment & Plan:  Chief Complaint:  Chief Complaint  Patient presents with   Right Elbow - Follow-up    ORIF distal humerus fracture 03/19/2022   Visit Diagnoses:  1. S/P ORIF (open reduction internal fixation) fracture     Plan: Taylor Myers is following up status post right distal humerus resection arthroplasty.  He is about 6 weeks from the surgery.  He is doing gentle range of motion and PT and OT at his facility.  He has a caretaker with him.  His elbow feels a lot less unstable.  Examination of the right elbow shows a fully healed surgical scar.  There are some mild erythema and swelling around the elbow.  No signs of infection.  He has limited elbow flexion and extension.  Medial and lateral laxity are greatly improved.  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  At this point he can wean from the hinged elbow brace.  Continue nonweightbearing to the right upper extremity.  To a certain degree I hope that his elbow can be stable and ankylosis from the heterotopic ossification so that he can weight-bear through the extremity.  Would like to recheck him in 6 weeks for two-view x-rays of the right elbow.  Follow-Up Instructions: Return in about 6 weeks (around 06/09/2022).   Orders:  Orders Placed This Encounter  Procedures   XR Elbow 2 Views Right   No orders of the defined types were placed in this encounter.   Imaging: XR Elbow 2 Views Right  Result Date: 04/28/2022 Large amount of heterotopic ossification formation around the elbow.  Stable elbow joint.  Stable osteotomy site.     PMFS History: Patient Active Problem List   Diagnosis Date Noted   History of open reduction and internal fixation (ORIF) procedure 03/19/2022   Right supracondylar humerus  fracture, closed, initial encounter 03/12/2022   Malnutrition of moderate degree 05/21/2021   Malnutrition (HCC) 05/19/2021   Gait difficulty 05/19/2021   Muscle weakness 05/19/2021   Noncompliance with medication regimen 05/19/2021   Stenosis of left carotid artery 05/19/2021   AKI (acute kidney injury) (HCC) 05/19/2021   Elevated LFTs 05/19/2021   Wheelchair bound 05/19/2021   Closed fracture of left orbital floor (HCC) 05/19/2021   S/P craniotomy 11/28/2020   Hypoalbuminemia 09/19/2020   Alcohol abuse, in remission 02/06/2020   Benign prostatic hyperplasia with lower urinary tract symptoms 02/06/2020   Chronic pain 02/06/2020   Essential hypertension 02/06/2020   Long term (current) use of anticoagulants 02/06/2020   Gout 02/06/2020   Asthma without status asthmaticus 02/06/2020   Paroxysmal atrial fibrillation (HCC) 02/06/2020   Type 2 diabetes mellitus without complications (HCC) 02/06/2020   Trigeminal neuralgia of right side of face 02/06/2020   Past Medical History:  Diagnosis Date   A-fib (HCC)    Alcohol abuse, in remission    Arthritis    Asthma    child hood asthma   History of kidney stones    "dormant kidney stone"   Hypertension    Wheelchair dependent     No family history on file.  Past Surgical History:  Procedure Laterality Date   ANKLE SURGERY Right    APPLICATION OF  WOUND VAC Right 03/19/2022   Procedure: APPLICATION OF WOUND VAC;  Surgeon: Tarry Kos, MD;  Location: [facility] OR;  Service: Orthopedics;  Laterality: Right;   HIP SURGERY Right    total replacement   KNEE SURGERY     bilateral replacements   ORIF HUMERUS FRACTURE Right 03/19/2022   Procedure: OPEN REDUCTION INTERNAL FIXATION (ORIF) RIGHT DISTAL HUMERUS FRACTURE;  Surgeon: Tarry Kos, MD;  Location: [facility] OR;  Service: Orthopedics;  Laterality: Right;   Social History   Occupational History   Not on file  Tobacco Use   Smoking status: Former    Types: Cigarettes   Smokeless tobacco:  Never  Vaping Use   Vaping Use: Never used  Substance and Sexual Activity   Alcohol use: Yes    Alcohol/week: 2.0 standard drinks of alcohol    Types: 2 Standard drinks or equivalent per week    Comment: occasionally   Drug use: Not Currently   Sexual activity: Not on file

## 2022-04-29 ENCOUNTER — Encounter: Payer: 59 | Admitting: Orthopaedic Surgery

## 2022-05-25 ENCOUNTER — Telehealth: Payer: Self-pay | Admitting: Orthopaedic Surgery

## 2022-05-25 NOTE — Telephone Encounter (Signed)
Herbert Deaner (PT) from [facility] called in with notifications of patient's pt visit. Clair Gulling states pt has not been taking pain medication due to side affects, also reports pt has not had a bowel movement in 3 days and rate pain 7 of 10 in upper extremity. Jim phone number is [phone].

## 2022-05-25 NOTE — Telephone Encounter (Signed)
Dexter, thanks.  Can you call patient and have him pick up a bottle of mag citrate from pharmacy if he has still not had a bm

## 2022-06-03 ENCOUNTER — Telehealth: Payer: Self-pay | Admitting: *Deleted

## 2022-06-03 NOTE — Chronic Care Management (AMB) (Signed)
  Care Coordination   Note   06/03/2022 Name: Taylor Myers MRN: [id number] DOB: 1950/12/21  Taylor Myers is a 72 y.o. year old male who sees Tisovec, Fransico Him, MD for primary care. I reached out to Vivi Ferns by phone today to offer care coordination services.  Taylor Myers was given information about Care Coordination services today including:   The Care Coordination services include support from the care team which includes your Nurse Coordinator, Clinical Social Worker, or Pharmacist.  The Care Coordination team is here to help remove barriers to the health concerns and goals most important to you. Care Coordination services are voluntary, and the patient may decline or stop services at any time by request to their care team member.   Care Coordination Consent Status: Patient agreed to services and verbal consent obtained.   Follow up plan:  Telephone appointment with care coordination team member scheduled for:  06/10/22  Encounter Outcome:  Pt. Scheduled  [place]  Direct Dial: [phone]

## 2022-06-08 ENCOUNTER — Ambulatory Visit (INDEPENDENT_AMBULATORY_CARE_PROVIDER_SITE_OTHER): Payer: Medicare Other | Admitting: Orthopaedic Surgery

## 2022-06-08 ENCOUNTER — Encounter: Payer: 59 | Admitting: Orthopaedic Surgery

## 2022-06-08 ENCOUNTER — Ambulatory Visit (INDEPENDENT_AMBULATORY_CARE_PROVIDER_SITE_OTHER): Payer: Medicare Other

## 2022-06-08 ENCOUNTER — Encounter: Payer: Self-pay | Admitting: Orthopaedic Surgery

## 2022-06-08 DIAGNOSIS — Z8781 Personal history of (healed) traumatic fracture: Secondary | ICD-10-CM | POA: Diagnosis not present

## 2022-06-08 DIAGNOSIS — Z9889 Other specified postprocedural states: Secondary | ICD-10-CM

## 2022-06-08 DIAGNOSIS — S42411A Displaced simple supracondylar fracture without intercondylar fracture of right humerus, initial encounter for closed fracture: Secondary | ICD-10-CM

## 2022-06-08 MED ORDER — HYDROCODONE-ACETAMINOPHEN 5-325 MG PO TABS
1.0000 | ORAL_TABLET | Freq: Every day | ORAL | 0 refills | Status: DC | PRN
Start: 2022-06-08 — End: 2022-07-15

## 2022-06-08 NOTE — Addendum Note (Signed)
Addended by: Lendon Collar on: 06/08/2022 04:20 PM   Modules accepted: Orders

## 2022-06-08 NOTE — Progress Notes (Signed)
Post-Op Visit Note   Patient: Taylor Myers           Date of Birth: 11-27-1950           MRN: [id number] Visit Date: 06/08/2022 PCP: Taylor Pao, MD   Assessment & Plan:  Chief Complaint:  Chief Complaint  Patient presents with   Right Shoulder - Follow-up    ORIF right distal humerus 03/19/2022   Visit Diagnoses:  1. S/P ORIF (open reduction internal fixation) fracture   2. Right supracondylar humerus fracture, closed, initial encounter     Plan: [name] is status post right distal humerus resection arthroplasty on 03/11/2022.  He mainly complains of limited range of motion.  He has soreness with use of the arm.  Examination of the right elbow shows a fully healed surgical scar.  He has a significant limitation to range of motion.  He has good varus valgus stability.  X-rays demonstrate abundant heterotopic ossification formation that is nearly completely bridging.  Explained that I am pleased with the fact that he is developed a lot of heterotopic ossification which is created as a stable elbow joint rather than the alternative of a flexible but unstable elbow.  The osteotomy site has fully consolidated.  I explained that he will permanently have significant limitation range of motion.  Overall it does seem like the pain is improving.  I will order some more home health OT for strengthening of his right upper extremity.  I would like to recheck him in 3 months with two-view x-rays of the right elbow.  Follow-Up Instructions: Return in about 3 months (around 09/08/2022).   Orders:  Orders Placed This Encounter  Procedures   XR Elbow 2 Views Right   Meds ordered this encounter  Medications   HYDROcodone-acetaminophen (NORCO) 5-325 MG tablet    Sig: Take 1 tablet by mouth daily as needed.    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  The previous osteotomy site has fully healed.   PMFS History: Patient Active Problem List   Diagnosis Date Noted   S/P ORIF (open reduction internal fixation) fracture 06/08/2022   History of open reduction and internal fixation (ORIF) procedure 03/19/2022   Right supracondylar humerus fracture, closed, initial encounter 03/12/2022   Malnutrition of moderate degree 05/21/2021   Malnutrition ([place]) 05/19/2021   Gait difficulty 05/19/2021   Muscle weakness 05/19/2021   Noncompliance with medication regimen 05/19/2021   Stenosis of left carotid artery 05/19/2021   AKI (acute kidney injury) ([place]) 05/19/2021   Elevated LFTs 05/19/2021   Wheelchair bound 05/19/2021   Closed fracture of left orbital floor ([place]) 05/19/2021   S/P craniotomy 11/28/2020   Hypoalbuminemia 09/19/2020   Alcohol abuse, in remission 02/06/2020   Benign prostatic hyperplasia with lower urinary tract symptoms 02/06/2020   Chronic pain 02/06/2020   Essential hypertension 02/06/2020   Long term (current) use of anticoagulants 02/06/2020   Gout 02/06/2020   Asthma without status asthmaticus 02/06/2020   Paroxysmal atrial fibrillation ([place]) 02/06/2020   Type 2 diabetes mellitus without complications ([place]) [id number]   Trigeminal neuralgia of right side of face 02/06/2020   Past Medical History:  Diagnosis Date   A-fib ([place])    Alcohol abuse, in remission    Arthritis    Asthma    child hood asthma   History of kidney  stones    "dormant kidney stone"   Hypertension    Wheelchair dependent     No family history on file.  Past Surgical History:  Procedure Laterality Date   ANKLE SURGERY Right    APPLICATION OF WOUND VAC Right 03/19/2022   Procedure: APPLICATION OF WOUND VAC;  Surgeon: Leandrew Koyanagi, MD;  Location: [place];  Service: Orthopedics;  Laterality: Right;   HIP SURGERY Right    total replacement   KNEE SURGERY     bilateral replacements   ORIF HUMERUS FRACTURE Right 03/19/2022   Procedure:  OPEN REDUCTION INTERNAL FIXATION (ORIF) RIGHT DISTAL HUMERUS FRACTURE;  Surgeon: Leandrew Koyanagi, MD;  Location: [place];  Service: Orthopedics;  Laterality: Right;   Social History   Occupational History   Not on file  Tobacco Use   Smoking status: Former    Types: Cigarettes   Smokeless tobacco: Never  Vaping Use   Vaping Use: Never used  Substance and Sexual Activity   Alcohol use: Yes    Alcohol/week: 2.0 standard drinks of alcohol    Types: 2 Standard drinks or equivalent per week    Comment: occasionally   Drug use: Not Currently   Sexual activity: Not on file

## 2022-06-10 ENCOUNTER — Ambulatory Visit: Payer: Self-pay

## 2022-06-10 NOTE — Patient Outreach (Signed)
  Care Coordination   06/10/2022 Name: Amareon Phung MRN: [id number] DOB: 12-29-1950   Care Coordination Outreach Attempts:  An unsuccessful telephone outreach was attempted for a scheduled appointment today.  Follow Up Plan:  Additional outreach attempts will be made to offer the patient care coordination information and services.   Encounter Outcome:  No Answer  Care Coordination Interventions Activated:  No   Care Coordination Interventions:  No, not indicated    Barb Merino, RN, BSN, CCM Care Management Coordinator [facility] Care Management Direct Phone: [phone]

## 2022-06-30 ENCOUNTER — Telehealth: Payer: Self-pay | Admitting: *Deleted

## 2022-06-30 NOTE — Progress Notes (Signed)
  Care Coordination Note  06/30/2022 Name: Taylor Myers MRN: [id number] DOB: 08/09/1951  Taylor Myers is a 71 y.o. year old male who is a primary care patient of Tisovec, Adelfa Koh, MD and is actively engaged with the care management team. I reached out to [name] by phone today to assist with re-scheduling an initial visit with the RN Case Manager  Follow up plan: Unsuccessful telephone outreach attempt made.   [facility]  Care Coordination Care Guide  Direct Dial: [phone]

## 2022-07-07 NOTE — Progress Notes (Signed)
  Care Coordination Note  07/07/2022 Name: Taylor Myers MRN: [id number] DOB: [birth year]  Taylor Myers is a 71 y.o. year old male who is a primary care patient of Tisovec, Adelfa Koh, MD and is actively engaged with the care management team. I reached out to [name] by phone today to assist with re-scheduling an initial visit with the RN Case Manager  Follow up plan: Unsuccessful telephone outreach attempt made.   [facility]  Care Coordination Care Guide  Direct Dial: [phone]

## 2022-07-12 ENCOUNTER — Inpatient Hospital Stay (HOSPITAL_COMMUNITY)
Admission: EM | Admit: 2022-07-12 | Discharge: 2022-07-15 | DRG: 309 | Disposition: A | Discharge status: Home Health | Payer: Medicare Other | Attending: Internal Medicine | Admitting: Internal Medicine

## 2022-07-12 ENCOUNTER — Emergency Department (HOSPITAL_COMMUNITY): Payer: Medicare Other

## 2022-07-12 DIAGNOSIS — I4510 Unspecified right bundle-branch block: Secondary | ICD-10-CM | POA: Diagnosis present

## 2022-07-12 DIAGNOSIS — Z789 Other specified health status: Secondary | ICD-10-CM | POA: Insufficient documentation

## 2022-07-12 DIAGNOSIS — E1151 Type 2 diabetes mellitus with diabetic peripheral angiopathy without gangrene: Secondary | ICD-10-CM | POA: Diagnosis present

## 2022-07-12 DIAGNOSIS — Z87891 Personal history of nicotine dependence: Secondary | ICD-10-CM

## 2022-07-12 DIAGNOSIS — Z7901 Long term (current) use of anticoagulants: Secondary | ICD-10-CM

## 2022-07-12 DIAGNOSIS — Z87442 Personal history of urinary calculi: Secondary | ICD-10-CM

## 2022-07-12 DIAGNOSIS — N400 Enlarged prostate without lower urinary tract symptoms: Secondary | ICD-10-CM | POA: Diagnosis present

## 2022-07-12 DIAGNOSIS — E876 Hypokalemia: Secondary | ICD-10-CM | POA: Diagnosis present

## 2022-07-12 DIAGNOSIS — H547 Unspecified visual loss: Secondary | ICD-10-CM | POA: Diagnosis present

## 2022-07-12 DIAGNOSIS — K760 Fatty (change of) liver, not elsewhere classified: Secondary | ICD-10-CM | POA: Diagnosis present

## 2022-07-12 DIAGNOSIS — R001 Bradycardia, unspecified: Secondary | ICD-10-CM | POA: Diagnosis not present

## 2022-07-12 DIAGNOSIS — F4310 Post-traumatic stress disorder, unspecified: Secondary | ICD-10-CM | POA: Diagnosis present

## 2022-07-12 DIAGNOSIS — H109 Unspecified conjunctivitis: Secondary | ICD-10-CM | POA: Diagnosis not present

## 2022-07-12 DIAGNOSIS — I480 Paroxysmal atrial fibrillation: Secondary | ICD-10-CM | POA: Diagnosis present

## 2022-07-12 DIAGNOSIS — J45909 Unspecified asthma, uncomplicated: Secondary | ICD-10-CM | POA: Diagnosis present

## 2022-07-12 DIAGNOSIS — I69354 Hemiplegia and hemiparesis following cerebral infarction affecting left non-dominant side: Secondary | ICD-10-CM

## 2022-07-12 DIAGNOSIS — R296 Repeated falls: Secondary | ICD-10-CM | POA: Diagnosis present

## 2022-07-12 DIAGNOSIS — B372 Candidiasis of skin and nail: Secondary | ICD-10-CM | POA: Diagnosis not present

## 2022-07-12 DIAGNOSIS — I4891 Unspecified atrial fibrillation: Secondary | ICD-10-CM | POA: Diagnosis present

## 2022-07-12 DIAGNOSIS — R410 Disorientation, unspecified: Secondary | ICD-10-CM | POA: Diagnosis present

## 2022-07-12 DIAGNOSIS — Z993 Dependence on wheelchair: Secondary | ICD-10-CM

## 2022-07-12 DIAGNOSIS — L89316 Pressure-induced deep tissue damage of right buttock: Secondary | ICD-10-CM | POA: Diagnosis present

## 2022-07-12 DIAGNOSIS — F1010 Alcohol abuse, uncomplicated: Secondary | ICD-10-CM | POA: Diagnosis present

## 2022-07-12 DIAGNOSIS — E860 Dehydration: Secondary | ICD-10-CM | POA: Diagnosis present

## 2022-07-12 DIAGNOSIS — M1990 Unspecified osteoarthritis, unspecified site: Secondary | ICD-10-CM | POA: Diagnosis present

## 2022-07-12 DIAGNOSIS — Z79899 Other long term (current) drug therapy: Secondary | ICD-10-CM

## 2022-07-12 DIAGNOSIS — I10 Essential (primary) hypertension: Secondary | ICD-10-CM | POA: Diagnosis present

## 2022-07-12 DIAGNOSIS — I959 Hypotension, unspecified: Secondary | ICD-10-CM | POA: Diagnosis present

## 2022-07-12 DIAGNOSIS — H1089 Other conjunctivitis: Secondary | ICD-10-CM | POA: Diagnosis present

## 2022-07-12 DIAGNOSIS — W19XXXA Unspecified fall, initial encounter: Secondary | ICD-10-CM | POA: Diagnosis present

## 2022-07-12 DIAGNOSIS — S0501XA Injury of conjunctiva and corneal abrasion without foreign body, right eye, initial encounter: Secondary | ICD-10-CM | POA: Diagnosis present

## 2022-07-12 DIAGNOSIS — S42401D Unspecified fracture of lower end of right humerus, subsequent encounter for fracture with routine healing: Secondary | ICD-10-CM

## 2022-07-12 DIAGNOSIS — R404 Transient alteration of awareness: Principal | ICD-10-CM

## 2022-07-12 DIAGNOSIS — Z91199 Patient's noncompliance with other medical treatment and regimen due to unspecified reason: Secondary | ICD-10-CM

## 2022-07-12 LAB — CBC WITH DIFFERENTIAL/PLATELET
Abs Immature Granulocytes: 0.03 10*3/uL (ref 0.00–0.07)
Basophils Absolute: 0 10*3/uL (ref 0.0–0.1)
Basophils Relative: 0 %
Eosinophils Absolute: 0.1 10*3/uL (ref 0.0–0.5)
Eosinophils Relative: 2 %
HCT: 33.7 % — ABNORMAL LOW (ref 39.0–52.0)
Hemoglobin: 11.2 g/dL — ABNORMAL LOW (ref 13.0–17.0)
Immature Granulocytes: 0 %
Lymphocytes Relative: 12 %
Lymphs Abs: 0.8 10*3/uL (ref 0.7–4.0)
MCH: 31.4 pg (ref 26.0–34.0)
MCHC: 33.2 g/dL (ref 30.0–36.0)
MCV: 94.4 fL (ref 80.0–100.0)
Monocytes Absolute: 0.8 10*3/uL (ref 0.1–1.0)
Monocytes Relative: 11 %
Neutro Abs: 5.1 10*3/uL (ref 1.7–7.7)
Neutrophils Relative %: 75 %
Platelets: 83 10*3/uL — ABNORMAL LOW (ref 150–400)
RBC: 3.57 MIL/uL — ABNORMAL LOW (ref 4.22–5.81)
RDW: 14.4 % (ref 11.5–15.5)
WBC: 6.9 10*3/uL (ref 4.0–10.5)
nRBC: 0 % (ref 0.0–0.2)

## 2022-07-12 LAB — COMPREHENSIVE METABOLIC PANEL
ALT: 17 U/L (ref 0–44)
AST: 34 U/L (ref 15–41)
Albumin: 3.4 g/dL — ABNORMAL LOW (ref 3.5–5.0)
Alkaline Phosphatase: 70 U/L (ref 38–126)
Anion gap: 14 (ref 5–15)
BUN: 21 mg/dL (ref 8–23)
CO2: 23 mmol/L (ref 22–32)
Calcium: 8.6 mg/dL — ABNORMAL LOW (ref 8.9–10.3)
Chloride: 103 mmol/L (ref 98–111)
Creatinine, Ser: 0.98 mg/dL (ref 0.61–1.24)
GFR, Estimated: >60 mL/min (ref >60)
Glucose, Bld: 105 mg/dL — ABNORMAL HIGH (ref 70–99)
Potassium: 2.9 mmol/L — ABNORMAL LOW (ref 3.5–5.1)
Sodium: 140 mmol/L (ref 135–145)
Total Bilirubin: 1.6 mg/dL — ABNORMAL HIGH (ref 0.3–1.2)
Total Protein: 6.3 g/dL — ABNORMAL LOW (ref 6.5–8.1)

## 2022-07-12 LAB — URINALYSIS, ROUTINE W REFLEX MICROSCOPIC
Bacteria, UA: NONE SEEN
Bilirubin Urine: NEGATIVE
Glucose, UA: NEGATIVE mg/dL
Hgb urine dipstick: NEGATIVE
Ketones, ur: 20 mg/dL — AB
Leukocytes,Ua: NEGATIVE
Nitrite: NEGATIVE
Protein, ur: 30 mg/dL — AB
Specific Gravity, Urine: 1.021 (ref 1.005–1.030)
pH: 5 (ref 5.0–8.0)

## 2022-07-12 LAB — MAGNESIUM: Magnesium: 1.5 mg/dL — ABNORMAL LOW (ref 1.7–2.4)

## 2022-07-12 LAB — LACTIC ACID, PLASMA
Lactic Acid, Venous: 1.3 mmol/L (ref 0.5–1.9)
Lactic Acid, Venous: 0.9 mmol/L (ref 0.5–1.9)

## 2022-07-12 LAB — DIGOXIN LEVEL: Digoxin Level: 0.7 ng/mL — ABNORMAL LOW (ref 0.8–2.0)

## 2022-07-12 MED ORDER — POTASSIUM CHLORIDE 10 MEQ/100ML IV SOLN
10.0000 meq | INTRAVENOUS | Status: AC
  Administered 2022-07-12 (×2): 10 meq via INTRAVENOUS
  Filled 2022-07-12 (×2): qty 100

## 2022-07-12 MED ORDER — POTASSIUM CHLORIDE CRYS ER 20 MEQ PO TBCR
40.0000 meq | EXTENDED_RELEASE_TABLET | Freq: Once | ORAL | Status: AC
  Administered 2022-07-12: 40 meq via ORAL
  Filled 2022-07-12: qty 2

## 2022-07-12 MED ORDER — NYSTATIN 100000 UNIT/GM EX CREA
TOPICAL_CREAM | Freq: Two times a day (BID) | CUTANEOUS | Status: DC
  Administered 2022-07-13: 1 via TOPICAL
  Filled 2022-07-12 (×2): qty 30

## 2022-07-12 MED ORDER — LACTATED RINGERS IV BOLUS
1000.0000 mL | Freq: Once | INTRAVENOUS | Status: AC
  Administered 2022-07-12: 1000 mL via INTRAVENOUS

## 2022-07-12 MED ORDER — ERYTHROMYCIN 5 MG/GM OP OINT
TOPICAL_OINTMENT | Freq: Four times a day (QID) | OPHTHALMIC | Status: DC
  Administered 2022-07-13 – 2022-07-14 (×3): 1 via OPHTHALMIC
  Filled 2022-07-12 (×4): qty 3.5

## 2022-07-12 MED ORDER — MAGNESIUM SULFATE 2 GM/50ML IV SOLN
2.0000 g | Freq: Once | INTRAVENOUS | Status: AC
  Administered 2022-07-12: 2 g via INTRAVENOUS
  Filled 2022-07-12: qty 50

## 2022-07-12 NOTE — ED Notes (Signed)
Patient transported to CT 

## 2022-07-12 NOTE — ED Notes (Signed)
MD notified of hypotension. Fluids ordered. See MAR. Pt remains oriented though somnolent-wakes to voice.

## 2022-07-12 NOTE — ED Provider Notes (Signed)
[facility] EMERGENCY DEPARTMENT Provider Note   CSN: [id number] Arrival date & time: 07/12/22  1518     History  Chief Complaint  Patient presents with   Altered Mental Status    Taylor Myers is a 71 y.o. male.   Altered Mental Status  The patient is a 71 year old male with past medical history of A-fib (on Eliquis), HTN, wheelchair dependence presenting by EMS for evaluation of falls and altered mental status.  Per the patient, he has been having recent falls within the last 1 occurring yesterday.  He states that when he fell he did not hit his head in the fall occurred during transferring.  He also has been feeling confused lately and reports subjective fevers overnight.  He denies vomiting, diarrhea, cough, shortness of breath, chest pain, or urinary symptoms.  During transport with EMS, the patient was noted to be in atrial fibrillation with transient bradycardia episodes down to the 30s with rapid resolution.  The patient receives home health care 3 times weekly.     Home Medications Prior to Admission medications   Medication Sig Start Date End Date Taking? Authorizing Provider  allopurinol (ZYLOPRIM) 300 MG tablet Take 300 mg by mouth daily. 04/18/21   [provider]  amLODipine (NORVASC) 5 MG tablet Take 5 mg by mouth daily. 03/18/21   [provider]  ARTIFICIAL TEAR SOLUTION OP Place 1 drop into both eyes daily.    [provider]  atorvastatin (LIPITOR) 40 MG tablet Take 40 mg by mouth daily. 03/18/21   [provider]  DHA-EPA-Flaxseed Oil-Vitamin E (THERATEARS NUTRITION) CAPS Take 1 capsule by mouth daily.    [provider]  digoxin (LANOXIN) 0.25 MG tablet Take 0.25 mg by mouth daily.    [provider]  ELIQUIS 5 MG TABS tablet Take 5 mg by mouth daily. 03/09/21   [provider]  erythromycin ophthalmic ointment Place 1 Application into both eyes 2 (two) times daily. 01/04/22   [provider]  HYDROcodone-acetaminophen (NORCO) 5-325 MG tablet Take 1 tablet by mouth daily as needed. 06/08/22   Tarry Kos, MD  Menthol, Topical Analgesic, (ICY HOT EX) Apply 1 Application topically daily as needed (pain).    [provider]  methocarbamol (ROBAXIN) 500 MG tablet Take 1 tablet (500 mg total) by mouth 2 (two) times daily as needed. 03/15/22   Cristie Hem, PA-C  metoprolol succinate (TOPROL-XL) 25 MG 24 hr tablet Take 25 mg by mouth daily. 04/18/21   [provider]  Multiple Vitamins-Minerals (LIVER DETOX PO) Take 2 tablets by mouth daily.    [provider]  ondansetron (ZOFRAN) 4 MG tablet Take 1 tablet (4 mg total) by mouth every 8 (eight) hours as needed for nausea or vomiting. 03/15/22   Cristie Hem, PA-C  OVER THE COUNTER MEDICATION Take 1 tablet by mouth 2 (two) times daily. Beet extract    [provider]  oxybutynin (DITROPAN) 5 MG tablet Take 5 mg by mouth daily. 04/18/21   [provider]  oxyCODONE-acetaminophen (PERCOCET) 5-325 MG tablet Take 1-2 tablets by mouth every 6 (six) hours as needed for severe pain. 03/22/22 03/22/23  Cristie Hem, PA-C  tamsulosin (FLOMAX) 0.4 MG CAPS capsule Take 0.4 mg by mouth daily.    [provider]  TURMERIC PO Take 1 tablet by mouth daily.    [provider]  venlafaxine XR (EFFEXOR-XR) 150 MG 24 hr capsule Take 150 mg by mouth  daily. 04/18/21   [provider]  Vitamin D, Ergocalciferol, (DRISDOL) 1.25 MG (50000 UNIT) CAPS capsule Take 50,000 Units by mouth every Monday.    [provider]      Allergies    Patient has no known allergies.    Review of Systems   Review of Systems  Physical Exam Updated Vital Signs BP (!) 103/48 (BP Location: Left Arm)   Pulse 61   Temp 98.7 F (37.1 C) (Rectal)   Resp 19   Ht 5\' 9"  (1.753 m)   Wt 68 kg   SpO2 98%   BMI 22.14 kg/m  Physical Exam Vitals and nursing note reviewed.   Constitutional:      General: He is not in acute distress.    Appearance: He is well-developed.  HENT:     Head: Normocephalic and atraumatic.     Nose: Nose normal.     Mouth/Throat:     Mouth: Mucous membranes are moist.     Pharynx: Oropharynx is clear.  Eyes:     Conjunctiva/sclera: Conjunctivae normal.     Comments: Conjunctivitis  noted in the right eye  Cardiovascular:     Rate and Rhythm: Normal rate. Rhythm irregular.     Heart sounds: No murmur heard.    Comments: Normal rate with intermittent bradycardia Pulmonary:     Effort: Pulmonary effort is normal. No respiratory distress.     Breath sounds: Normal breath sounds.  Abdominal:     Palpations: Abdomen is soft.     Tenderness: There is no abdominal tenderness.  Musculoskeletal:        General: No swelling.     Cervical back: Neck supple.     Comments: Bilateral lower extremity wounds to the feet  Skin:    General: Skin is warm and dry.     Capillary Refill: Capillary refill takes less than 2 seconds.  Neurological:     Mental Status: He is alert and oriented to person, place, and time.  Psychiatric:        Mood and Affect: Mood normal.     ED Results / Procedures / Treatments   Labs (all labs ordered are listed, but only abnormal results are displayed) Labs Reviewed  CBC WITH DIFFERENTIAL/PLATELET - Abnormal; Notable for the following components:      Result Value   RBC 3.57 (*)    Hemoglobin 11.2 (*)    HCT 33.7 (*)    Platelets 83 (*)    All other components within normal limits  COMPREHENSIVE METABOLIC PANEL - Abnormal; Notable for the following components:   Potassium 2.9 (*)    Glucose, Bld 105 (*)    Calcium 8.6 (*)    Total Protein 6.3 (*)    Albumin 3.4 (*)    Total Bilirubin 1.6 (*)    All other components within normal limits  URINALYSIS, ROUTINE W REFLEX MICROSCOPIC - Abnormal; Notable for the following components:   Color, Urine AMBER (*)    Ketones, ur 20 (*)    Protein, ur 30 (*)     All other components within normal limits  CULTURE, BLOOD (ROUTINE X 2)  CULTURE, BLOOD (ROUTINE X 2)  LACTIC ACID, PLASMA  LACTIC ACID, PLASMA  DIGOXIN LEVEL  MAGNESIUM    EKG EKG Interpretation  Date/Time:  Monday July 12 2022 15:20:58 EST Ventricular Rate:  75 PR Interval:    QRS Duration: 154 QT Interval:  433 QTC Calculation: 484 R Axis:   154 Text Interpretation:  Atrial fibrillation Ventricular premature complex Right bundle branch block Confirmed by Benjiman Core [id number]) on 07/12/2022 4:33:14 PM  Radiology CT Head Wo Contrast  Result Date: 07/12/2022 CLINICAL DATA:  Mental status change, unknown cause; Neck trauma (Age >= 65y) fall EXAM: CT HEAD WITHOUT CONTRAST CT CERVICAL SPINE WITHOUT CONTRAST TECHNIQUE: Multidetector CT imaging of the head and cervical spine was performed following the standard protocol without intravenous contrast. Multiplanar CT image reconstructions of the cervical spine were also generated. RADIATION DOSE REDUCTION: This exam was performed according to the departmental dose-optimization program which includes automated exposure control, adjustment of the mA and/or kV according to patient size and/or use of iterative reconstruction technique. COMPARISON:  CT head and C-spine 03/10/2022 FINDINGS: CT HEAD FINDINGS BRAIN: BRAIN Cerebral ventricle sizes are concordant with the degree of cerebral volume loss. Left cerebellar encephalomalacia. No evidence of large-territorial acute infarction. No parenchymal hemorrhage. No mass lesion. No extra-axial collection. No mass effect or midline shift. No hydrocephalus. Basilar cisterns are patent. Vascular: No hyperdense vessel. Skull: No acute fracture or focal lesion. Chronic right temporal occipital region craniotomy. Sinuses/Orbits: Paranasal sinuses and mastoid air cells are clear. The orbits are unremarkable. Other: None. CT CERVICAL SPINE FINDINGS Alignment: Stable grade grade 1/grade 2 anterolisthesis of  C7 on T1. Mild retrolisthesis of C3 on C4 that is stable. Grade 1 anterolisthesis of C4 on C5 that is stable. Skull base and vertebrae: Similar-appearing multilevel severe degenerative changes of the spine with bulky osteophyte formation. Bulky posterior disc osteophyte complex formation at the C3-C4 level. Associated severe left osseous neural foraminal stenosis at C7-T1 level. Associated at least mild to moderate central osseous central canal stenosis at the C3 C4 level. No acute fracture. No aggressive appearing focal osseous lesion or focal pathologic process. Soft tissues and spinal canal: No prevertebral fluid or swelling. No visible canal hematoma. Upper chest: Unremarkable. Other: None. IMPRESSION: 1. No acute intracranial abnormality. 2. No acute displaced fracture or traumatic listhesis of the cervical spine. 3. 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 Electronically Signed   By: Tish Frederickson M.D.   On: 07/12/2022 17:29   CT Cervical Spine Wo Contrast  Result Date: 07/12/2022 CLINICAL DATA:  Mental status change, unknown cause; Neck trauma (Age >= 65y) fall EXAM: CT HEAD WITHOUT CONTRAST CT CERVICAL SPINE WITHOUT CONTRAST TECHNIQUE: Multidetector CT imaging of the head and cervical spine was performed following the standard protocol without intravenous contrast. Multiplanar CT image reconstructions of the cervical spine were also generated. RADIATION DOSE REDUCTION: This exam was performed according to the departmental dose-optimization program which includes automated exposure control, adjustment of the mA and/or kV according to patient size and/or use of iterative reconstruction technique. COMPARISON:  CT head and C-spine 03/10/2022 FINDINGS: CT HEAD FINDINGS BRAIN: BRAIN Cerebral ventricle sizes are concordant with the degree of cerebral volume loss. Left cerebellar encephalomalacia. No  evidence of large-territorial acute infarction. No parenchymal hemorrhage. No mass lesion. No extra-axial collection. No mass effect or midline shift. No hydrocephalus. Basilar cisterns are patent. Vascular: No hyperdense vessel. Skull: No acute fracture or focal lesion. Chronic right temporal occipital region craniotomy. Sinuses/Orbits: Paranasal sinuses and mastoid air cells are clear. The orbits are unremarkable. Other: None. CT CERVICAL SPINE FINDINGS Alignment: Stable grade grade 1/grade 2 anterolisthesis of C7 on T1. Mild retrolisthesis of C3 on C4 that is stable. Grade 1 anterolisthesis of C4 on C5 that is stable. Skull base and vertebrae: Similar-appearing  multilevel severe degenerative changes of the spine with bulky osteophyte formation. Bulky posterior disc osteophyte complex formation at the C3-C4 level. Associated severe left osseous neural foraminal stenosis at C7-T1 level. Associated at least mild to moderate central osseous central canal stenosis at the C3 C4 level. No acute fracture. No aggressive appearing focal osseous lesion or focal pathologic process. Soft tissues and spinal canal: No prevertebral fluid or swelling. No visible canal hematoma. Upper chest: Unremarkable. Other: None. IMPRESSION: 1. No acute intracranial abnormality. 2. No acute displaced fracture or traumatic listhesis of the cervical spine. 3. 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 Electronically Signed   By: Tish FredericksonMorgane  Naveau M.D.   On: 07/12/2022 17:29   DG Chest Portable 1 View  Result Date: 07/12/2022 CLINICAL DATA:  Altered mental status.  Multiple recent falls. EXAM: PORTABLE CHEST 1 VIEW COMPARISON:  Radiographs 05/19/2021 and 05/11/2021.  CT 03/28/2021. FINDINGS: 1636 hours. Moderate patient rotation to the right. Allowing for the rotation, stable mild volume loss of the right lung base. No confluent airspace  opacity, pleural effusion or pneumothorax. The heart size and mediastinal contours are stable with aortic atherosclerosis. No acute osseous findings are identified. Glenohumeral degenerative changes are present bilaterally with chronic posttraumatic deformity of the proximal left humerus. Multiple telemetry leads overlie the chest. IMPRESSION: No evidence of acute cardiopulmonary process. Electronically Signed   By: Carey BullocksWilliam  Veazey M.D.   On: 07/12/2022 16:51    Procedures Procedures    Medications Ordered in ED Medications  potassium chloride 10 mEq in 100 mL IVPB (10 mEq Intravenous New Bag/Given 07/12/22 2243)  potassium chloride SA (KLOR-CON M) CR tablet 40 mEq (40 mEq Oral Given 07/12/22 1724)  lactated ringers bolus 1,000 mL (0 mLs Intravenous Stopped 07/12/22 2013)    ED Course/ Medical Decision Making/ A&P                           Medical Decision Making  The patient is a 71 year old male with past medical history of A-fib (on Eliquis), HTN, wheelchair dependence presenting by EMS for evaluation of falls and altered mental status.  The differential diagnosis includes: Dehydration, infection, sepsis, symptomatic bradycardia, medication side effect, hypoglycemia, thyroid dysfunction, heart block.  On initial evaluation, patient is noted to be hemodynamically stable and afebrile.  An EKG showed atrial fibrillation, with occasional PVCs but no signs of ST segment elevation.  Shortly after his arrival, patient was noted to have an episode of bradycardia down to 29 with rapid return to heart rate greater than 60.  The patient's diagnostic work-up included a CMP with potassium low at 2.9 and glucose of 105 with T. bili elevated to 1.6 but otherwise no additional metabolic abnormalities; he received a CBC with white blood cell count of 6.9 hemoglobin of 11.2; blood cultures x2; digoxin level which was pending; and urinalysis which had been collected and was in process.   The patient received  a CT head which showed no acute intracranial abnormality; CT C-spine without fracture or malalignment; and chest x-ray without signs of focal consolidation to suggest pneumonia or cardiopulmonary abnormality.  The patient is noted to have a drop in his blood pressure to 84/56.  He received a bolus of IV fluids with resolution of his hypotension to 106/71.  The patient's potassium was repleted in the emergency department.  Based on the patient's history of symptoms, physical exam, and  diagnostic work-up symptomatic bradycardia is favored as the result of patient's recent falls.  He was admitted to the hospitalist service for further evaluation.    Amount and/or Complexity of Data Reviewed Labs: ordered. Radiology: ordered.  Risk Prescription drug management. Decision regarding hospitalization.           Final Clinical Impression(s) / ED Diagnoses Final diagnoses:  Transient alteration of awareness  Falls, initial encounter    Rx / DC Orders ED Discharge Orders     None         Knox Saliva, MD 07/12/22 2318    Benjiman Core, MD 07/13/22 1431

## 2022-07-12 NOTE — ED Notes (Addendum)
[phone] pts father called for a update

## 2022-07-12 NOTE — H&P (Signed)
History and Physical    Patient: Taylor Myers RJJ:[id number] DOB: [birth year] DOA: 07/12/2022 DOS: the patient was seen and examined on 07/13/2022 PCP: Gaspar Garbe, MD  Patient coming from: Home  Chief Complaint:  Chief Complaint  Patient presents with   Altered Mental Status   HPI: Taylor Myers is a 71 y.o. male with medical history significant of Paroxysmal atrial fibrillation on Eliquis, hypertension, peripheral vascular disease, asthma, type 2 diabetes, hx of craniotomy, ongoing alcohol abuse with hx of withdrawal who presents with recurrent falls and AMS.   Pt lethargic, awake to voice but not cooperative with answering questions. Contacted father who lives with him who was a limited historian. Reportedly pt has long standing hx of alcohol abuse with withdrawal in the past. Father found lots of empty liquor bottles in his room tonight. Not sure if pt takes his medications or other drugs.   In the ED, temperature 98.7 with intermittent bradycardia into the 40s per EDP.  BP also fluctuating and borderline with SBP of 98-103.  WBC of 6.9, hemoglobin 11.2, platelet of 83.  Sodium of 140, K of 2.9, CBG of 105, calcium of 8.6 with albumin of 3.4, creatinine of 0.98.  UA was negative for leukocyte, nitrite and no bacteria.  CT head and cervical spine without any acute intracranial abnormalities, no acute fracture.  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 Review of Systems: unable to review all systems due to the inability of the patient to answer questions. Past Medical History:  Diagnosis Date   A-fib (HCC)    Alcohol abuse, in remission    Arthritis    Asthma    child hood asthma   History of kidney stones    "dormant kidney stone"   Hypertension    Wheelchair dependent    Past Surgical History:  Procedure Laterality Date   ANKLE SURGERY Right    APPLICATION OF WOUND VAC Right 03/19/2022   Procedure: APPLICATION OF  WOUND VAC;  Surgeon: Tarry Kos, MD;  Location: [facility] OR;  Service: Orthopedics;  Laterality: Right;   HIP SURGERY Right    total replacement   KNEE SURGERY     bilateral replacements   ORIF HUMERUS FRACTURE Right 03/19/2022   Procedure: OPEN REDUCTION INTERNAL FIXATION (ORIF) RIGHT DISTAL HUMERUS FRACTURE;  Surgeon: Tarry Kos, MD;  Location: [facility] OR;  Service: Orthopedics;  Laterality: Right;   Social History:  reports that he has quit smoking. His smoking use included cigarettes. He has never used smokeless tobacco. He reports current alcohol use of about 2.0 standard drinks of alcohol per week. He reports that he does not currently use drugs.  No Known Allergies  No family history on file.  Prior to Admission medications   Medication Sig Start Date End Date Taking? Authorizing Provider  allopurinol (ZYLOPRIM) 300 MG tablet Take 300 mg by mouth daily. 04/18/21   [provider]  amLODipine (NORVASC) 5 MG tablet Take 5 mg by mouth daily. 03/18/21   [provider]  ARTIFICIAL TEAR SOLUTION OP Place 1 drop into both eyes daily.    [provider]  atorvastatin (LIPITOR) 40 MG tablet Take 40 mg by mouth daily. 03/18/21   [provider]  DHA-EPA-Flaxseed Oil-Vitamin E (THERATEARS NUTRITION) CAPS Take 1 capsule by mouth daily.    [provider]  digoxin (LANOXIN) 0.25 MG tablet Take 0.25 mg by mouth daily.    [provider]  ELIQUIS 5 MG TABS  tablet Take 5 mg by mouth daily. 03/09/21   [provider]  erythromycin ophthalmic ointment Place 1 Application into both eyes 2 (two) times daily. 01/04/22   [provider]  HYDROcodone-acetaminophen (NORCO) 5-325 MG tablet Take 1 tablet by mouth daily as needed. 06/08/22   Tarry Kos, MD  Menthol, Topical Analgesic, (ICY HOT EX) Apply 1 Application topically daily as needed (pain).    [provider]  methocarbamol (ROBAXIN) 500 MG tablet Take 1 tablet (500 mg total)  by mouth 2 (two) times daily as needed. 03/15/22   Cristie Hem, PA-C  metoprolol succinate (TOPROL-XL) 25 MG 24 hr tablet Take 25 mg by mouth daily. 04/18/21   [provider]  Multiple Vitamins-Minerals (LIVER DETOX PO) Take 2 tablets by mouth daily.    [provider]  ondansetron (ZOFRAN) 4 MG tablet Take 1 tablet (4 mg total) by mouth every 8 (eight) hours as needed for nausea or vomiting. 03/15/22   Cristie Hem, PA-C  OVER THE COUNTER MEDICATION Take 1 tablet by mouth 2 (two) times daily. Beet extract    [provider]  oxybutynin (DITROPAN) 5 MG tablet Take 5 mg by mouth daily. 04/18/21   [provider]  oxyCODONE-acetaminophen (PERCOCET) 5-325 MG tablet Take 1-2 tablets by mouth every 6 (six) hours as needed for severe pain. 03/22/22 03/22/23  Cristie Hem, PA-C  tamsulosin (FLOMAX) 0.4 MG CAPS capsule Take 0.4 mg by mouth daily.    [provider]  TURMERIC PO Take 1 tablet by mouth daily.    [provider]  venlafaxine XR (EFFEXOR-XR) 150 MG 24 hr capsule Take 150 mg by mouth daily. 04/18/21   [provider]  Vitamin D, Ergocalciferol, (DRISDOL) 1.25 MG (50000 UNIT) CAPS capsule Take 50,000 Units by mouth every Monday.    [provider]    Physical Exam: Vitals:   07/12/22 2115 07/12/22 2126 07/12/22 2145 07/12/22 2330  BP: 106/71  (!) 103/48 (!) 94/59  Pulse: 76  61 60  Resp: 18  19 16   Temp:  98.7 F (37.1 C)    TempSrc:  Rectal    SpO2: 97%  98% 99%  Weight:      Height:       Constitutional: NAD, unkept lethargic nontoxic-appearing elderly male laying in bed.  Awoke easily to voice but was mostly uncooperative with answering questions. Eyes: Right eye with yellow crusting and discharge with edema, clouding of the conjunctiva due to cataracts. ENMT: Mucous membranes are moist.  Neck: normal, supple Respiratory: clear to auscultation bilaterally, no wheezing, no crackles. Normal respiratory  effort. No accessory muscle use.  Cardiovascular: Regular rate and rhythm, no murmurs / rubs / gallops. No extremity edema.   Abdomen: Soft, nondistended nontender.  Bowel sounds positive.  Musculoskeletal: no clubbing / cyanosis.  Hammertoe deformity to feet. Skin: Moist erythematous rash throughout the right antitragus/groin region Neurologic: CN 2-12 grossly intact. Strength 5/5 in all 4.  Good bilateral hand grip. Psychiatric: Unable to fully assess as patient is lethargic.    Data Reviewed:  See HPI  Assessment and Plan: * Bradycardia -Has intermittent bradycardia. On my evaluation, HR in the 40s at rest and up to 60s with exertion. Suspect likely induced by heavy alcohol use. Pt has hx of withdrawal in the past and father found empty liquor bottles throughout room today. -check ETOH, UDS -keep on continuous telemetry overnight -EKG without any AV blocks  Candidal dermatitis Nystatin ointment twice daily  to right intertriginous/groin region  Bacterial conjunctivitis of right eye Erythromycin ointment TID  Alcohol use -no signs of withdrawal at this time but has ongoing heavy alcohol use per father who lives with him -keep on CIWA protocol  Hypocalcemia Corrected calcium of 8.7. Will replete Hypomagnesium and follow repeat Ca tomorrow  Hypomagnesemia Mg of 1.5. Replete with IV Mg.  Hypokalemia -repleted with IV and oral K   Paroxysmal atrial fibrillation (HCC) Continue Eliquis -digoxin is on medication list but levels are low most likely due to non-compliance. Will hold for now.  Essential hypertension Borderline soft BP with SBP in the 90s. Continue fluid hydration and hold any home antihypertensives      Advance Care Planning:   Code Status: Full Code   Consults: none  Family Communication: Discussed with father although speech was difficult to understand and he was a limited historian  Severity of Illness: The appropriate patient status for this patient  is OBSERVATION. Observation status is judged to be reasonable and necessary in order to provide the required intensity of service to ensure the patient's safety. The patient's presenting symptoms, physical exam findings, and initial radiographic and laboratory data in the context of their medical condition is felt to place them at decreased risk for further clinical deterioration. Furthermore, it is anticipated that the patient will be medically stable for discharge from the hospital within 2 midnights of admission.   Author: Anselm Jungling, DO 07/13/2022 12:16 AM  For on call review [email or web address].

## 2022-07-12 NOTE — Assessment & Plan Note (Signed)
Mg of 1.5. Replete with IV Mg.

## 2022-07-12 NOTE — ED Triage Notes (Signed)
BIB [facility] from home. Multiple falls recently most recent yesterday-eliquis. Afib-brady down to 30's with EMS.Warm to touch. AMS/confused/hallucinating- reported by niece. Pt lives with Dad and they receive home health 3 times a week.

## 2022-07-12 NOTE — Assessment & Plan Note (Signed)
Corrected calcium of 8.7. Will replete Hypomagnesium and follow repeat Ca tomorrow

## 2022-07-12 NOTE — ED Notes (Signed)
Condom cath applied. Pt rolled and brief applied. Irritation seen around buttocks-no open wounds seen.

## 2022-07-12 NOTE — Assessment & Plan Note (Signed)
-  repleted with IV and oral K

## 2022-07-13 ENCOUNTER — Inpatient Hospital Stay (HOSPITAL_COMMUNITY): Payer: Medicare Other

## 2022-07-13 DIAGNOSIS — J45909 Unspecified asthma, uncomplicated: Secondary | ICD-10-CM | POA: Diagnosis present

## 2022-07-13 DIAGNOSIS — K760 Fatty (change of) liver, not elsewhere classified: Secondary | ICD-10-CM | POA: Diagnosis present

## 2022-07-13 DIAGNOSIS — B372 Candidiasis of skin and nail: Secondary | ICD-10-CM | POA: Diagnosis present

## 2022-07-13 DIAGNOSIS — F4310 Post-traumatic stress disorder, unspecified: Secondary | ICD-10-CM | POA: Diagnosis present

## 2022-07-13 DIAGNOSIS — I4891 Unspecified atrial fibrillation: Secondary | ICD-10-CM

## 2022-07-13 DIAGNOSIS — H109 Unspecified conjunctivitis: Secondary | ICD-10-CM | POA: Insufficient documentation

## 2022-07-13 DIAGNOSIS — M1990 Unspecified osteoarthritis, unspecified site: Secondary | ICD-10-CM | POA: Diagnosis present

## 2022-07-13 DIAGNOSIS — L89316 Pressure-induced deep tissue damage of right buttock: Secondary | ICD-10-CM | POA: Diagnosis present

## 2022-07-13 DIAGNOSIS — Z993 Dependence on wheelchair: Secondary | ICD-10-CM | POA: Diagnosis not present

## 2022-07-13 DIAGNOSIS — I69354 Hemiplegia and hemiparesis following cerebral infarction affecting left non-dominant side: Secondary | ICD-10-CM | POA: Diagnosis not present

## 2022-07-13 DIAGNOSIS — Z87891 Personal history of nicotine dependence: Secondary | ICD-10-CM | POA: Diagnosis not present

## 2022-07-13 DIAGNOSIS — W19XXXA Unspecified fall, initial encounter: Secondary | ICD-10-CM

## 2022-07-13 DIAGNOSIS — N400 Enlarged prostate without lower urinary tract symptoms: Secondary | ICD-10-CM | POA: Diagnosis present

## 2022-07-13 DIAGNOSIS — F1010 Alcohol abuse, uncomplicated: Secondary | ICD-10-CM | POA: Diagnosis present

## 2022-07-13 DIAGNOSIS — I10 Essential (primary) hypertension: Secondary | ICD-10-CM | POA: Diagnosis present

## 2022-07-13 DIAGNOSIS — H1089 Other conjunctivitis: Secondary | ICD-10-CM | POA: Diagnosis present

## 2022-07-13 DIAGNOSIS — E860 Dehydration: Secondary | ICD-10-CM | POA: Diagnosis present

## 2022-07-13 DIAGNOSIS — R001 Bradycardia, unspecified: Secondary | ICD-10-CM | POA: Diagnosis present

## 2022-07-13 DIAGNOSIS — Z789 Other specified health status: Secondary | ICD-10-CM | POA: Insufficient documentation

## 2022-07-13 DIAGNOSIS — I480 Paroxysmal atrial fibrillation: Secondary | ICD-10-CM | POA: Diagnosis present

## 2022-07-13 DIAGNOSIS — E876 Hypokalemia: Secondary | ICD-10-CM | POA: Diagnosis present

## 2022-07-13 DIAGNOSIS — H547 Unspecified visual loss: Secondary | ICD-10-CM | POA: Diagnosis present

## 2022-07-13 DIAGNOSIS — R454 Irritability and anger: Secondary | ICD-10-CM | POA: Diagnosis not present

## 2022-07-13 DIAGNOSIS — Z79899 Other long term (current) drug therapy: Secondary | ICD-10-CM | POA: Diagnosis not present

## 2022-07-13 DIAGNOSIS — I959 Hypotension, unspecified: Secondary | ICD-10-CM | POA: Diagnosis present

## 2022-07-13 DIAGNOSIS — S42401D Unspecified fracture of lower end of right humerus, subsequent encounter for fracture with routine healing: Secondary | ICD-10-CM | POA: Diagnosis not present

## 2022-07-13 DIAGNOSIS — E1151 Type 2 diabetes mellitus with diabetic peripheral angiopathy without gangrene: Secondary | ICD-10-CM | POA: Diagnosis present

## 2022-07-13 DIAGNOSIS — F1090 Alcohol use, unspecified, uncomplicated: Secondary | ICD-10-CM | POA: Diagnosis not present

## 2022-07-13 LAB — ECHOCARDIOGRAM COMPLETE
AR max vel: 2.28 cm2
AV Area VTI: 2.57 cm2
AV Area mean vel: 2.41 cm2
AV Mean grad: 4.3 mmHg
AV Peak grad: 8.4 mmHg
Ao pk vel: 1.45 m/s
Area-P 1/2: 3.59 cm2
Calc EF: 58.5 %
Height: 69 in
MV M vel: 3.35 m/s
MV Peak grad: 44.9 mmHg
S' Lateral: 3.5 cm
Single Plane A2C EF: 55 %
Single Plane A4C EF: 63.5 %
Weight: 2398.6 oz

## 2022-07-13 LAB — COMPREHENSIVE METABOLIC PANEL
ALT: 17 U/L (ref 0–44)
AST: 26 U/L (ref 15–41)
Albumin: 2.9 g/dL — ABNORMAL LOW (ref 3.5–5.0)
Alkaline Phosphatase: 63 U/L (ref 38–126)
Anion gap: 12 (ref 5–15)
BUN: 12 mg/dL (ref 8–23)
CO2: 24 mmol/L (ref 22–32)
Calcium: 8 mg/dL — ABNORMAL LOW (ref 8.9–10.3)
Chloride: 103 mmol/L (ref 98–111)
Creatinine, Ser: 0.79 mg/dL (ref 0.61–1.24)
GFR, Estimated: >60 mL/min (ref >60)
Glucose, Bld: 80 mg/dL (ref 70–99)
Potassium: 3.7 mmol/L (ref 3.5–5.1)
Sodium: 139 mmol/L (ref 135–145)
Total Bilirubin: 1.6 mg/dL — ABNORMAL HIGH (ref 0.3–1.2)
Total Protein: 5.1 g/dL — ABNORMAL LOW (ref 6.5–8.1)

## 2022-07-13 LAB — BASIC METABOLIC PANEL
Anion gap: 13 (ref 5–15)
BUN: 13 mg/dL (ref 8–23)
CO2: 23 mmol/L (ref 22–32)
Calcium: 7.8 mg/dL — ABNORMAL LOW (ref 8.9–10.3)
Chloride: 104 mmol/L (ref 98–111)
Creatinine, Ser: 0.77 mg/dL (ref 0.61–1.24)
GFR, Estimated: >60 mL/min (ref >60)
Glucose, Bld: 85 mg/dL (ref 70–99)
Potassium: 3.7 mmol/L (ref 3.5–5.1)
Sodium: 140 mmol/L (ref 135–145)

## 2022-07-13 LAB — CBC
HCT: 31 % — ABNORMAL LOW (ref 39.0–52.0)
Hemoglobin: 10.6 g/dL — ABNORMAL LOW (ref 13.0–17.0)
MCH: 32.7 pg (ref 26.0–34.0)
MCHC: 34.2 g/dL (ref 30.0–36.0)
MCV: 95.7 fL (ref 80.0–100.0)
Platelets: 81 10*3/uL — ABNORMAL LOW (ref 150–400)
RBC: 3.24 MIL/uL — ABNORMAL LOW (ref 4.22–5.81)
RDW: 14.4 % (ref 11.5–15.5)
WBC: 5.1 10*3/uL (ref 4.0–10.5)
nRBC: 0 % (ref 0.0–0.2)

## 2022-07-13 LAB — RAPID URINE DRUG SCREEN, HOSP PERFORMED
Amphetamines: NOT DETECTED
Barbiturates: NOT DETECTED
Benzodiazepines: NOT DETECTED
Cocaine: NOT DETECTED
Opiates: POSITIVE — AB
Tetrahydrocannabinol: NOT DETECTED

## 2022-07-13 LAB — ETHANOL: Alcohol, Ethyl (B): <10 mg/dL (ref <10)

## 2022-07-13 LAB — VITAMIN B12: Vitamin B-12: 654 pg/mL (ref 180–914)

## 2022-07-13 LAB — FOLATE: Folate: 12.3 ng/mL (ref >5.9)

## 2022-07-13 LAB — BRAIN NATRIURETIC PEPTIDE: B Natriuretic Peptide: 263.3 pg/mL — ABNORMAL HIGH (ref 0.0–100.0)

## 2022-07-13 LAB — TSH: TSH: 0.354 u[IU]/mL (ref 0.350–4.500)

## 2022-07-13 LAB — MAGNESIUM: Magnesium: 1.9 mg/dL (ref 1.7–2.4)

## 2022-07-13 LAB — C-REACTIVE PROTEIN: CRP: 3.5 mg/dL — ABNORMAL HIGH (ref <1.0)

## 2022-07-13 MED ORDER — LORAZEPAM 1 MG PO TABS
1.0000 mg | ORAL_TABLET | ORAL | Status: DC
  Administered 2022-07-13 – 2022-07-14 (×2): 1 mg via ORAL
  Filled 2022-07-13 (×2): qty 1

## 2022-07-13 MED ORDER — FOLIC ACID 1 MG PO TABS
1.0000 mg | ORAL_TABLET | Freq: Every day | ORAL | Status: DC
  Administered 2022-07-13 – 2022-07-15 (×3): 1 mg via ORAL
  Filled 2022-07-13 (×3): qty 1

## 2022-07-13 MED ORDER — VITAMIN D (ERGOCALCIFEROL) 1.25 MG (50000 UNIT) PO CAPS: 50000.0000 [IU] | ORAL_CAPSULE | ORAL | Status: DC

## 2022-07-13 MED ORDER — TAMSULOSIN HCL 0.4 MG PO CAPS
0.4000 mg | ORAL_CAPSULE | Freq: Every day | ORAL | Status: DC
  Administered 2022-07-13 – 2022-07-15 (×3): 0.4 mg via ORAL
  Filled 2022-07-13 (×3): qty 1

## 2022-07-13 MED ORDER — THIAMINE MONONITRATE 100 MG PO TABS
100.0000 mg | ORAL_TABLET | Freq: Every day | ORAL | Status: DC
  Administered 2022-07-13 – 2022-07-15 (×3): 100 mg via ORAL
  Filled 2022-07-13 (×3): qty 1

## 2022-07-13 MED ORDER — LORAZEPAM 2 MG/ML IJ SOLN: 1.0000 mg | INTRAMUSCULAR | Status: DC

## 2022-07-13 MED ORDER — THIAMINE HCL 100 MG/ML IJ SOLN: 100.0000 mg | Freq: Every day | INTRAMUSCULAR | Status: DC

## 2022-07-13 MED ORDER — THERATEARS NUTRITION PO CAPS: 1.0000 | ORAL_CAPSULE | Freq: Every day | ORAL | Status: DC

## 2022-07-13 MED ORDER — LACTATED RINGERS IV BOLUS
500.0000 mL | Freq: Once | INTRAVENOUS | Status: AC
  Administered 2022-07-13: 500 mL via INTRAVENOUS

## 2022-07-13 MED ORDER — APIXABAN 5 MG PO TABS
5.0000 mg | ORAL_TABLET | Freq: Two times a day (BID) | ORAL | Status: DC
  Administered 2022-07-13 – 2022-07-15 (×5): 5 mg via ORAL
  Filled 2022-07-13 (×5): qty 1

## 2022-07-13 MED ORDER — NAPHAZOLINE-GLYCERIN 0.012-0.25 % OP SOLN
2.0000 [drp] | Freq: Four times a day (QID) | OPHTHALMIC | Status: DC
  Administered 2022-07-13 – 2022-07-15 (×8): 2 [drp] via OPHTHALMIC
  Filled 2022-07-13 (×2): qty 15

## 2022-07-13 MED ORDER — LACTATED RINGERS IV SOLN: INTRAVENOUS | Status: DC

## 2022-07-13 MED ORDER — ADULT MULTIVITAMIN W/MINERALS CH
1.0000 | ORAL_TABLET | Freq: Every day | ORAL | Status: DC
  Administered 2022-07-13 – 2022-07-15 (×3): 1 via ORAL
  Filled 2022-07-13 (×3): qty 1

## 2022-07-13 MED ORDER — ACETAMINOPHEN 325 MG PO TABS
650.0000 mg | ORAL_TABLET | Freq: Four times a day (QID) | ORAL | Status: DC | PRN
  Administered 2022-07-13 – 2022-07-15 (×5): 650 mg via ORAL
  Filled 2022-07-13 (×5): qty 2

## 2022-07-13 MED ORDER — ALLOPURINOL 300 MG PO TABS
300.0000 mg | ORAL_TABLET | Freq: Every day | ORAL | Status: DC
  Administered 2022-07-13 – 2022-07-15 (×3): 300 mg via ORAL
  Filled 2022-07-13 (×2): qty 1
  Filled 2022-07-13: qty 3

## 2022-07-13 MED ORDER — HYDROCODONE-ACETAMINOPHEN 5-325 MG PO TABS
1.0000 | ORAL_TABLET | ORAL | Status: AC | PRN
  Administered 2022-07-14 – 2022-07-15 (×2): 1 via ORAL
  Filled 2022-07-13 (×2): qty 1

## 2022-07-13 MED ORDER — LACTATED RINGERS IV SOLN: INTRAVENOUS | Status: AC

## 2022-07-13 MED ORDER — CALCIUM GLUCONATE-NACL 1-0.675 GM/50ML-% IV SOLN
1.0000 g | Freq: Once | INTRAVENOUS | Status: AC
  Administered 2022-07-13: 1000 mg via INTRAVENOUS
  Filled 2022-07-13: qty 50

## 2022-07-13 MED ORDER — ATORVASTATIN CALCIUM 40 MG PO TABS
40.0000 mg | ORAL_TABLET | Freq: Every day | ORAL | Status: DC
  Administered 2022-07-13 – 2022-07-15 (×3): 40 mg via ORAL
  Filled 2022-07-13 (×3): qty 1
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
## 2022-07-13 NOTE — Assessment & Plan Note (Signed)
Borderline soft BP with SBP in the 90s. Continue fluid hydration and hold any home antihypertensives

## 2022-07-13 NOTE — Progress Notes (Signed)
  Echocardiogram 2D Echocardiogram has been performed.  Taylor Myers 07/13/2022, 4:47 PM

## 2022-07-13 NOTE — Consult Note (Addendum)
Cardiology Consultation   Patient ID: Taylor Myers MRN: [id number]; DOB: [birth year]  Admit date: 07/12/2022 Date of Consult: 07/13/2022  PCP:  Gaspar Garbe, MD   [facility] Providers Cardiologist:  None        Patient Profile:   Taylor Myers is a 71 y.o. male with a hx of paroxysmal atrial fibrillation (on Eliquis), hypertension, peripheral vascular disease, asthma, type II diabetes, temporal occipital region craniotomy/trigeminal nerve decompression, significant arthritis s/p multiple surgeries, alcohol abuse who is being seen 07/13/2022 for the evaluation of atrial fibrillation at the request of Dr. Thedore Mins.  History of Present Illness:   Taylor Myers is a 71 year old male with above noted medical history. Patient presented to the ED on 11/20 via EMS for evaluation of frequent falls and altered mental status. Patient reported 2 falls and recent confusion in the days prior to admission. Patient has a history of alcohol abuse with withdrawal and admitting physician reportedly spoke with patient's father who found "lots of empty liquor bottles" in the patient's room the night he presented to the hospital.   On my exam today, patient admits that he has limited recollection of the last 48 hours. He tells me that he believes his last drink was approximately 10 days ago. Patient confirms recent falls but strongly denies any prodromal lightheadedness/dizziness. He says that both falls were a result of physical weakness/loss of balance. His right eye peripheral vision is reportedly decreased 2/2 previous temporal/occipital craniotomy and this make his spatial awareness poor. Patient also denies any recent chest pain, palpitations, shortness of breath. He says he was originally diagnosed with afib approximately 15 years ago while living in [place]. He was initially placed on Warfarin but suffered frequent nosebleeds. Eventually was transitioned to Eliquis and did not have recurrent epistaxis.  Patient reports compliance with Eliquis, Digoxin, and Metoprolol at home. Since arriving in [place], patient's cardiac medications have been managed by his PCP, Dr. Wylene Simmer.  ED tele review shows intermittent bradycardia into the upper 20s, low 30s with occasional 2-2.5 second pauses. Patient denies any sensation of lightheadedness while in the hospital that might correlate with this tele observation.   Past Medical History:  Diagnosis Date   A-fib (HCC)    Alcohol abuse, in remission    Arthritis    Asthma    child hood asthma   History of kidney stones    "dormant kidney stone"   Hypertension    Wheelchair dependent     Past Surgical History:  Procedure Laterality Date   ANKLE SURGERY Right    APPLICATION OF WOUND VAC Right 03/19/2022   Procedure: APPLICATION OF WOUND VAC;  Surgeon: Tarry Kos, MD;  Location: [facility] OR;  Service: Orthopedics;  Laterality: Right;   HIP SURGERY Right    total replacement   KNEE SURGERY     bilateral replacements   ORIF HUMERUS FRACTURE Right 03/19/2022   Procedure: OPEN REDUCTION INTERNAL FIXATION (ORIF) RIGHT DISTAL HUMERUS FRACTURE;  Surgeon: Tarry Kos, MD;  Location: [facility] OR;  Service: Orthopedics;  Laterality: Right;     Home Medications:  Prior to Admission medications   Medication Sig Start Date End Date Taking? Authorizing Provider  allopurinol (ZYLOPRIM) 300 MG tablet Take 300 mg by mouth daily. 04/18/21   [provider]  amLODipine (NORVASC) 5 MG tablet Take 5 mg by mouth daily. 03/18/21   [provider]  ARTIFICIAL TEAR SOLUTION OP Place 1 drop into both eyes daily.  [provider]  atorvastatin (LIPITOR) 40 MG tablet Take 40 mg by mouth daily. 03/18/21   [provider]  DHA-EPA-Flaxseed Oil-Vitamin E (THERATEARS NUTRITION) CAPS Take 1 capsule by mouth daily.    [provider]  digoxin (LANOXIN) 0.25 MG tablet Take 0.25 mg by mouth daily.    [provider]  ELIQUIS 5 MG TABS tablet  Take 5 mg by mouth daily. 03/09/21   [provider]  erythromycin ophthalmic ointment Place 1 Application into both eyes 2 (two) times daily. 01/04/22   [provider]  HYDROcodone-acetaminophen (NORCO) 5-325 MG tablet Take 1 tablet by mouth daily as needed. 06/08/22   Tarry Kos, MD  Menthol, Topical Analgesic, (ICY HOT EX) Apply 1 Application topically daily as needed (pain).    [provider]  methocarbamol (ROBAXIN) 500 MG tablet Take 1 tablet (500 mg total) by mouth 2 (two) times daily as needed. 03/15/22   Cristie Hem, PA-C  metoprolol succinate (TOPROL-XL) 25 MG 24 hr tablet Take 25 mg by mouth daily. 04/18/21   [provider]  Multiple Vitamins-Minerals (LIVER DETOX PO) Take 2 tablets by mouth daily.    [provider]  ondansetron (ZOFRAN) 4 MG tablet Take 1 tablet (4 mg total) by mouth every 8 (eight) hours as needed for nausea or vomiting. 03/15/22   Cristie Hem, PA-C  OVER THE COUNTER MEDICATION Take 1 tablet by mouth 2 (two) times daily. Beet extract    [provider]  oxybutynin (DITROPAN) 5 MG tablet Take 5 mg by mouth daily. 04/18/21   [provider]  oxyCODONE-acetaminophen (PERCOCET) 5-325 MG tablet Take 1-2 tablets by mouth every 6 (six) hours as needed for severe pain. 03/22/22 03/22/23  Cristie Hem, PA-C  tamsulosin (FLOMAX) 0.4 MG CAPS capsule Take 0.4 mg by mouth daily.    [provider]  TURMERIC PO Take 1 tablet by mouth daily.    [provider]  venlafaxine XR (EFFEXOR-XR) 150 MG 24 hr capsule Take 150 mg by mouth daily. 04/18/21   [provider]  Vitamin D, Ergocalciferol, (DRISDOL) 1.25 MG (50000 UNIT) CAPS capsule Take 50,000 Units by mouth every Monday.    [provider]    Inpatient Medications: Scheduled Meds:  allopurinol  300 mg Oral Daily   apixaban  5 mg Oral BID   atorvastatin  40 mg Oral Daily   erythromycin   Right Eye Q6H   folic acid  1  mg Oral Daily   LORazepam  1-4 mg Oral Q4H   Or   LORazepam  1-4 mg Intravenous Q4H   multivitamin with minerals  1 tablet Oral Daily   naphazoline-glycerin  2 drop Both Eyes Q6H   nystatin cream   Topical BID   tamsulosin  0.4 mg Oral Daily   thiamine  100 mg Oral Daily   Or   thiamine  100 mg Intravenous Daily   [START ON 07/19/2022] Vitamin D (Ergocalciferol)  50,000 Units Oral Q Mon   Continuous Infusions:  lactated ringers 75 mL/hr at 07/13/22 0534   PRN Meds: acetaminophen, HYDROcodone-acetaminophen  Allergies:   No Known Allergies  Social History:   Social History   Socioeconomic History   Marital status: Divorced    Spouse name: Not on file   Number of children: Not on file   Years of education: Not on file   Highest education level: Not on file  Occupational History   Not on file  Tobacco  Use   Smoking status: Former    Types: Cigarettes   Smokeless tobacco: Never  Vaping Use   Vaping Use: Never used  Substance and Sexual Activity   Alcohol use: Yes    Alcohol/week: 2.0 standard drinks of alcohol    Types: 2 Standard drinks or equivalent per week    Comment: occasionally   Drug use: Not Currently   Sexual activity: Not on file  Other Topics Concern   Not on file  Social History Narrative   Not on file   Social Determinants of Health   Financial Resource Strain: Not on file  Food Insecurity: Not on file  Transportation Needs: Not on file  Physical Activity: Not on file  Stress: Not on file  Social Connections: Not on file  Intimate Partner Violence: Not on file    Family History:   No family history on file.   ROS:  Please see the history of present illness.   All other ROS reviewed and negative.     Physical Exam/Data:   Vitals:   07/13/22 0815 07/13/22 0905 07/13/22 1230 07/13/22 1300  BP: 101/66  (!) 109/57 114/63  Pulse: (!) 46  72 86  Resp:   (!) 23 18  Temp:  98.2 F (36.8 C)    TempSrc:  Oral    SpO2:   95% 100%  Weight:       Height:        Intake/Output Summary (Last 24 hours) at 07/13/2022 1359 Last data filed at 07/13/2022 0913 Gross per 24 hour  Intake 550 ml  Output --  Net 550 ml      07/12/2022    3:29 PM 03/19/2022    9:42 AM 09/21/2021    1:04 PM  Last 3 Weights  Weight (lbs) 149 lb 14.6 oz 150 lb 160 lb  Weight (kg) 68 kg 68.04 kg 72.576 kg     Body mass index is 22.14 kg/m.  General:  Well nourished, well developed, in no acute distress HEENT: baseline bulging of right eye with visible changes 2/2 cataracts. Neck: no JVD Vascular: No carotid bruits; Distal pulses 2+ bilaterally Cardiac:  normal S1, S2; irregularly irregular. no murmur  Lungs:  clear to auscultation bilaterally, no wheezing, rhonchi or rales  Abd: soft, nontender, no hepatomegaly  Ext: no edema Musculoskeletal:  No deformities, BUE and BLE strength normal and equal Skin: warm and dry  Neuro:  CNs 2-12 intact, no focal abnormalities noted Psych:  Normal affect   EKG:  The EKG was personally reviewed and demonstrates: atrial fibrillation with RBBB morphology Telemetry:  Telemetry was personally reviewed and demonstrates:  atrial fibrillation with intermittent bradycardia and ~2 second pauses.  Relevant CV Studies: Echocardiogram pending  Laboratory Data:  High Sensitivity Troponin:  No results for input(s): "TROPONINIHS" in the last 720 hours.   Chemistry Recent Labs  Lab 07/12/22 1534 07/12/22 2145 07/13/22 0109 07/13/22 0610  NA 140  --  140 139  K 2.9*  --  3.7 3.7  CL 103  --  104 103  CO2 23  --  23 24  GLUCOSE 105*  --  85 80  BUN 21  --  13 12  CREATININE 0.98  --  0.77 0.79  CALCIUM 8.6*  --  7.8* 8.0*  MG  --  1.5*  --  1.9  GFRNONAA >60  --  >60 >60  ANIONGAP 14  --  13 12    Recent Labs  Lab 07/12/22 1534  07/13/22 0610  PROT 6.3* 5.1*  ALBUMIN 3.4* 2.9*  AST 34 26  ALT 17 17  ALKPHOS 70 63  BILITOT 1.6* 1.6*   Lipids No results for input(s): "CHOL", "TRIG", "HDL", "LABVLDL",  "LDLCALC", "CHOLHDL" in the last 168 hours.  Hematology Recent Labs  Lab 07/12/22 1534 07/13/22 0400  WBC 6.9 5.1  RBC 3.57* 3.24*  HGB 11.2* 10.6*  HCT 33.7* 31.0*  MCV 94.4 95.7  MCH 31.4 32.7  MCHC 33.2 34.2  RDW 14.4 14.4  PLT 83* 81*   Thyroid  Recent Labs  Lab 07/13/22 0610  TSH 0.354    BNP Recent Labs  Lab 07/13/22 0400  BNP 263.3*    DDimer No results for input(s): "DDIMER" in the last 168 hours.   Radiology/Studies:  CT Head Wo Contrast  Result Date: 07/12/2022 CLINICAL DATA:  Mental status change, unknown cause; Neck trauma (Age >= 65y) fall EXAM: CT HEAD WITHOUT CONTRAST CT CERVICAL SPINE WITHOUT CONTRAST TECHNIQUE: Multidetector CT imaging of the head and cervical spine was performed following the standard protocol without intravenous contrast. Multiplanar CT image reconstructions of the cervical spine were also generated. RADIATION DOSE REDUCTION: This exam was performed according to the departmental dose-optimization program which includes automated exposure control, adjustment of the mA and/or kV according to patient size and/or use of iterative reconstruction technique. COMPARISON:  CT head and C-spine 03/10/2022 FINDINGS: CT HEAD FINDINGS BRAIN: BRAIN Cerebral ventricle sizes are concordant with the degree of cerebral volume loss. Left cerebellar encephalomalacia. No evidence of large-territorial acute infarction. No parenchymal hemorrhage. No mass lesion. No extra-axial collection. No mass effect or midline shift. No hydrocephalus. Basilar cisterns are patent. Vascular: No hyperdense vessel. Skull: No acute fracture or focal lesion. Chronic right temporal occipital region craniotomy. Sinuses/Orbits: Paranasal sinuses and mastoid air cells are clear. The orbits are unremarkable. Other: None. CT CERVICAL SPINE FINDINGS Alignment: Stable grade grade 1/grade 2 anterolisthesis of C7 on T1. Mild retrolisthesis of C3 on C4 that is stable. Grade 1 anterolisthesis of C4  on C5 that is stable. Skull base and vertebrae: Similar-appearing multilevel severe degenerative changes of the spine with bulky osteophyte formation. Bulky posterior disc osteophyte complex formation at the C3-C4 level. Associated severe left osseous neural foraminal stenosis at C7-T1 level. Associated at least mild to moderate central osseous central canal stenosis at the C3 C4 level. No acute fracture. No aggressive appearing focal osseous lesion or focal pathologic process. Soft tissues and spinal canal: No prevertebral fluid or swelling. No visible canal hematoma. Upper chest: Unremarkable. Other: None. IMPRESSION: 1. No acute intracranial abnormality. 2. No acute displaced fracture or traumatic listhesis of the cervical spine. 3. 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 Electronically Signed   By: Tish Frederickson M.D.   On: 07/12/2022 17:29   CT Cervical Spine Wo Contrast  Result Date: 07/12/2022 CLINICAL DATA:  Mental status change, unknown cause; Neck trauma (Age >= 65y) fall EXAM: CT HEAD WITHOUT CONTRAST CT CERVICAL SPINE WITHOUT CONTRAST TECHNIQUE: Multidetector CT imaging of the head and cervical spine was performed following the standard protocol without intravenous contrast. Multiplanar CT image reconstructions of the cervical spine were also generated. RADIATION DOSE REDUCTION: This exam was performed according to the departmental dose-optimization program which includes automated exposure control, adjustment of the mA and/or kV according to patient size and/or use of iterative reconstruction technique. COMPARISON:  CT head and C-spine 03/10/2022 FINDINGS: CT HEAD FINDINGS BRAIN:  BRAIN Cerebral ventricle sizes are concordant with the degree of cerebral volume loss. Left cerebellar encephalomalacia. No evidence of large-territorial acute infarction. No parenchymal hemorrhage. No mass lesion.  No extra-axial collection. No mass effect or midline shift. No hydrocephalus. Basilar cisterns are patent. Vascular: No hyperdense vessel. Skull: No acute fracture or focal lesion. Chronic right temporal occipital region craniotomy. Sinuses/Orbits: Paranasal sinuses and mastoid air cells are clear. The orbits are unremarkable. Other: None. CT CERVICAL SPINE FINDINGS Alignment: Stable grade grade 1/grade 2 anterolisthesis of C7 on T1. Mild retrolisthesis of C3 on C4 that is stable. Grade 1 anterolisthesis of C4 on C5 that is stable. Skull base and vertebrae: Similar-appearing multilevel severe degenerative changes of the spine with bulky osteophyte formation. Bulky posterior disc osteophyte complex formation at the C3-C4 level. Associated severe left osseous neural foraminal stenosis at C7-T1 level. Associated at least mild to moderate central osseous central canal stenosis at the C3 C4 level. No acute fracture. No aggressive appearing focal osseous lesion or focal pathologic process. Soft tissues and spinal canal: No prevertebral fluid or swelling. No visible canal hematoma. Upper chest: Unremarkable. Other: None. IMPRESSION: 1. No acute intracranial abnormality. 2. No acute displaced fracture or traumatic listhesis of the cervical spine. 3. 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 Electronically Signed   By: Tish Frederickson M.D.   On: 07/12/2022 17:29   DG Chest Portable 1 View  Result Date: 07/12/2022 CLINICAL DATA:  Altered mental status.  Multiple recent falls. EXAM: PORTABLE CHEST 1 VIEW COMPARISON:  Radiographs 05/19/2021 and 05/11/2021.  CT 03/28/2021. FINDINGS: 1636 hours. Moderate patient rotation to the right. Allowing for the rotation, stable mild volume loss of the right lung base. No confluent airspace opacity, pleural effusion or pneumothorax. The heart size and mediastinal contours are  stable with aortic atherosclerosis. No acute osseous findings are identified. Glenohumeral degenerative changes are present bilaterally with chronic posttraumatic deformity of the proximal left humerus. Multiple telemetry leads overlie the chest. IMPRESSION: No evidence of acute cardiopulmonary process. Electronically Signed   By: Carey Bullocks M.D.   On: 07/12/2022 16:51     Assessment and Plan:   Atrial fibrillation, secondary hypercoagulable state  Patient with history of afib, first diagnosed 15 years ago in [place]. He has been on Eliquis 5mg  for quite some time, initially anticoagulated with Warfarin. Since his admission on 11/20, telemetry shows atrial fibrillation with intermittent bradycardia. Patient's home med regimen includes digoxin 0.25mg , metoprolol succinate 25mg . Both have been held given paroxysmal bradycardia.  Given patient's known alcohol abuse history and now reports of falls, use of Eliquis is concerning for elevated bleeding risk.  Discussed risk and benefits of anticoagulation with patient.  He reports he is mostly wheelchair-bound and feels comfortable continuing anticoagulation for now Appears that patient does not have any symptoms with bradycardia. He sustained rates >65 while awake and talking with me on my exam.  Bradycardia seems primarily while he is sleeping, will plan for outpatient sleep study For now, continue holding home Digoxin and Metoprolol Succinate. If patient were to develop symptoms of slow HR or have frequent occurrence while awake, would need to consider involving EP.  Check echocardiogram   Hypertension  Patient normotensive to hypotensive this admission. Hold home amlodipine and metoprolol.   Frequent falls  Patient reporting falls of mechanical and poor vision etiology. Support PT/OT evaluation. Would continue Eliquis while inpatient. Given patient's physical limitations, it might be worth  considering assisted living placement in the near  future.  Concern for cirrhosis  Patient with elevated bilirubin, 1.6, decreased albumin 2.9, total protein 5.1. 05/19/2021 RUQ ultrasound with moderate hepatic steatosis. Could consider repeat abdominal imaging this admission.   Risk Assessment/Risk Scores:          CHA2DS2-VASc Score = 4   This indicates a 4.8% annual risk of stroke. The patient's score is based upon: CHF History: 0 HTN History: 1 Diabetes History: 1 Stroke History: 0 Vascular Disease History: 1 Age Score: 1 Gender Score: 0         For questions or updates, please contact [facility] Please consult [email or web address] for contact info under    Signed, Perlie Gold, PA-C  07/13/2022 1:59 PM  Patient seen and examined.  Agree with above documentation.  Taylor Myers is a 71 year old male with a history of atrial fibrillation on Eliquis, PAD, T2DM, hypertension, alcohol abuse who we are consulted by Dr. Thedore Mins for evaluation of atrial fibrillation.  He is currently alert and oriented but reports he does not remember much of the last 48 hours.  He had presented to the ED on 11/20 with falls and altered mental status.  His father found empty liquor bottles in his room.  In ED, initial vital signs notable for BP 109/72, pulse 90.  EKG shows atrial fibrillation, rate 75, right bundle blanch block, PVC.  Labs notable for potassium 2.9, magnesium 1.5, albumin 3.4, creatinine 0.98, lactate 1.3, hemoglobin 1.2, WBC 6.9, platelets 83.  Chest x-ray unremarkable.  Head CT showed no acute intracranial abnormality.  Telemetry shows atrial fibrillation with rates down to 30s to 40s overnight but currently in 80s, had pauses up to 3 seconds.  On exam, patient is alert and oriented, irregular rhythm, normal rate, no murmurs, lungs CTAB, no LE edema or JVD.  For his atrial fibrillation, given his bradycardia overnight, would continue to hold metoprolol and digoxin.  Heart rate has improved, currently in 80s.  He is at elevated risk on  Eliquis given his falls, but he tells me his last fall was 2 months ago and as he is primarily wheelchair-bound, he feels comfortable on anticoagulation.  Would continue Eliquis for now.  Continue to monitor on telemetry.  Will check echocardiogram.  Little Ishikawa, MD

## 2022-07-13 NOTE — Assessment & Plan Note (Signed)
Nystatin ointment twice daily to right intertriginous/groin region

## 2022-07-13 NOTE — ED Notes (Signed)
Per previous RN- MD Tu already aware of patient's HR that is consistently drops to 30-40s BPM. No new orders received at this time

## 2022-07-13 NOTE — Progress Notes (Signed)
Physical Therapy Evaluation Patient Details Name: Taylor Myers MRN: [id number] DOB: 09-May-1951 Today's Date: 07/13/2022  History of Present Illness  Pt is a 71 y/o malewho presented with AMS, falls and weakness in setting of dehydration, hypotension and bradycardia. PMH: a fib, HTN, PVD, DM2, hx of craniotomy, R elbow sx, R facial reconstruction sx with visual deficits, etoh use  Clinical Impression  Pt was seen for mobility on side of bed with assist to stand and then was unsteady and using bed for standing balance control.  Pt is from home with last month being a gradual mobility decline since discharge from rehab setting.  Pt is not clear on how this has evolved, but seems strong enough to get back to  mobility soon.  Follow acutely for new chair order, to work on further standing tolerance, to work on LE strengthening and to increase safety with all wb activity.  Pt is motivated, but has significant issues of health such as corneal abrasion on R eye, that are slowing him down for full recovery.  Follow for goals of acute PT as outlined below.       Recommendations for follow up therapy are one component of a multi-disciplinary discharge planning process, led by the attending physician.  Recommendations may be updated based on patient status, additional functional criteria and insurance authorization.  Follow Up Recommendations Home health PT      Assistance Recommended at Discharge Intermittent Supervision/Assistance  Patient can return home with the following  A little help with walking and/or transfers;A little help with bathing/dressing/bathroom;Assistance with cooking/housework;Assist for transportation;Help with stairs or ramp for entrance    Equipment Recommendations Wheelchair (measurements PT);Wheelchair cushion (measurements PT)  Recommendations for Other Services       Functional Status Assessment Patient has had a recent decline in their functional status and demonstrates the  ability to make significant improvements in function in a reasonable and predictable amount of time.     Precautions / Restrictions Precautions Precautions: Fall Restrictions Weight Bearing Restrictions: No      Mobility  Bed Mobility Overal bed mobility: Needs Assistance Bed Mobility: Supine to Sit, Sit to Supine     Supine to sit: Min assist Sit to supine: Min assist   General bed mobility comments: trunk to get up and manage lines    Transfers Overall transfer level: Needs assistance Equipment used: 1 person hand held assist Transfers: Sit to/from Stand Sit to Stand: Min assist           General transfer comment: pt is mildly unsteady enough to use gurney to support and control balance, but then could help with hips to get back to bed    Ambulation/Gait               General Gait Details: deferred  Stairs            Wheelchair Mobility    Modified Rankin (Stroke Patients Only)       Balance Overall balance assessment: Needs assistance Sitting-balance support: Feet supported Sitting balance-Leahy Scale: Fair     Standing balance support: Bilateral upper extremity supported, During functional activity, Single extremity supported Standing balance-Leahy Scale: Poor                               Pertinent Vitals/Pain Pain Assessment Pain Assessment: Faces Faces Pain Scale: Hurts little more Pain Location: R eye Pain Descriptors / Indicators: Guarding, Grimacing Pain Intervention(s): Other (comment) (  pt is being managed with nursing)    Home Living Family/patient expects to be discharged to:: Private residence Living Arrangements: Parent Available Help at Discharge: Neighbor;Friend(s) Type of Home: House Home Access: Ramped entrance       Home Layout: One level Home Equipment: Wheelchair - manual;Grab bars - tub/shower;Grab bars - toilet;Shower seat;Hand held shower head;Adaptive equipment      Prior Function Prior  Level of Function : Independent/Modified Independent             Mobility Comments: has walked in past few months but since leaving rehab a month ago has gradually relied on WC       Hand Dominance   Dominant Hand: Right    Extremity/Trunk Assessment   Upper Extremity Assessment Upper Extremity Assessment: Defer to OT evaluation    Lower Extremity Assessment Lower Extremity Assessment: Overall WFL for tasks assessed    Cervical / Trunk Assessment Cervical / Trunk Assessment: Normal  Communication   Communication: No difficulties  Cognition Arousal/Alertness: Awake/alert Behavior During Therapy: WFL for tasks assessed/performed Overall Cognitive Status: Within Functional Limits for tasks assessed                                          General Comments General comments (skin integrity, edema, etc.): pt is in need of wheelchair for home with help available for getting into home on ramp    Exercises     Assessment/Plan    PT Assessment Patient needs continued PT services  PT Problem List Decreased activity tolerance;Decreased balance;Decreased coordination;Decreased knowledge of use of DME;Decreased safety awareness       PT Treatment Interventions DME instruction;Gait training;Functional mobility training;Therapeutic activities;Therapeutic exercise;Balance training;Neuromuscular re-education;Patient/family education    PT Goals (Current goals can be found in the Care Plan section)  Acute Rehab PT Goals Patient Stated Goal: to get home and recover walking skills PT Goal Formulation: With patient Time For Goal Achievement: 07/27/22 Potential to Achieve Goals: Good    Frequency Min 3X/week     Co-evaluation               AM-PAC PT "6 Clicks" Mobility  Outcome Measure Help needed turning from your back to your side while in a flat bed without using bedrails?: A Little Help needed moving from lying on your back to sitting on the side  of a flat bed without using bedrails?: A Little Help needed moving to and from a bed to a chair (including a wheelchair)?: A Little Help needed standing up from a chair using your arms (e.g., wheelchair or bedside chair)?: A Little Help needed to walk in hospital room?: A Little Help needed climbing 3-5 steps with a railing? : Total 6 Click Score: 16    End of Session Equipment Utilized During Treatment: Gait belt Activity Tolerance: Patient limited by fatigue;Treatment limited secondary to medical complications (Comment) Patient left: in bed;with call bell/phone within reach Nurse Communication: Mobility status PT Visit Diagnosis: Unsteadiness on feet (R26.81);Difficulty in walking, not elsewhere classified (R26.2)    Time: 4166-0630 PT Time Calculation (min) (ACUTE ONLY): 21 min   Charges:   PT Evaluation $PT Eval Moderate Complexity: 1 Mod         Ivar Drape 07/13/2022, 3:59 PM Samul Dada, PT PhD Acute Rehab Dept. Number: [facility] [id number] and [facility] [phone]

## 2022-07-13 NOTE — H&P (Incomplete)
History and Physical    Patient: Taylor Myers BSW:[id number] DOB: [birth year] DOA: 07/12/2022 DOS: the patient was seen and examined on 07/12/2022 PCP: Tisovec, Adelfa Koh, MD  Patient coming from: Home  Chief Complaint:  Chief Complaint  Patient presents with  . Altered Mental Status   HPI: Taylor Myers is a 71 y.o. male with medical history significant of Paroxysmal atrial fibrillation on Eliquis, hypertension, peripheral vascular disease, asthma, type 2 diabetes, hx of craniotomy, ongoing alcohol abuse with hx of withdrawal who presents with recurrent falls and AMS.   Pt lethargic, awake to voice but not cooperative with answering questions. Contacted father who lives with him who was a limited historian. Reportedly pt has long standing hx of alcohol abuse with withdrawal in the past. Father found lots of empty liquor bottles in his room tonight. Not sure if pt takes his medications or other drugs.   In the ED, temperature 98.7 with intermittent bradycardia into the 40s per EDP.  BP also fluctuating and borderline with SBP of 98-103.  WBC of 6.9, hemoglobin 11.2, platelet of 83.  Sodium of 140, K of 2.9, CBG of 105, calcium of 8.6 with albumin of 3.4, creatinine of 0.98.  UA was negative for leukocyte, nitrite and no bacteria.  CT head and cervical spine without any acute intracranial abnormalities, no acute fracture.  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 Review of Systems: unable to review all systems due to the inability of the patient to answer questions. Past Medical History:  Diagnosis Date  . A-fib (HCC)   . Alcohol abuse, in remission   . Arthritis   . Asthma    child hood asthma  . History of kidney stones    "dormant kidney stone"  . Hypertension   . Wheelchair dependent    Past Surgical History:  Procedure Laterality Date  . ANKLE SURGERY Right   . APPLICATION OF WOUND VAC Right 03/19/2022   Procedure:  APPLICATION OF WOUND VAC;  Surgeon: Tarry Kos, MD;  Location: [facility] OR;  Service: Orthopedics;  Laterality: Right;  . HIP SURGERY Right    total replacement  . KNEE SURGERY     bilateral replacements  . ORIF HUMERUS FRACTURE Right 03/19/2022   Procedure: OPEN REDUCTION INTERNAL FIXATION (ORIF) RIGHT DISTAL HUMERUS FRACTURE;  Surgeon: Tarry Kos, MD;  Location: [facility] OR;  Service: Orthopedics;  Laterality: Right;   Social History:  reports that he has quit smoking. His smoking use included cigarettes. He has never used smokeless tobacco. He reports current alcohol use of about 2.0 standard drinks of alcohol per week. He reports that he does not currently use drugs.  No Known Allergies  No family history on file.  Prior to Admission medications   Medication Sig Start Date End Date Taking? Authorizing Provider  allopurinol (ZYLOPRIM) 300 MG tablet Take 300 mg by mouth daily. 04/18/21   [provider]  amLODipine (NORVASC) 5 MG tablet Take 5 mg by mouth daily. 03/18/21   [provider]  ARTIFICIAL TEAR SOLUTION OP Place 1 drop into both eyes daily.    [provider]  atorvastatin (LIPITOR) 40 MG tablet Take 40 mg by mouth daily. 03/18/21   [provider]  DHA-EPA-Flaxseed Oil-Vitamin E (THERATEARS NUTRITION) CAPS Take 1 capsule by mouth daily.    [provider]  digoxin (LANOXIN) 0.25 MG tablet Take 0.25 mg by mouth daily.    [provider]  ELIQUIS 5 MG TABS  tablet Take 5 mg by mouth daily. 03/09/21   [provider]  erythromycin ophthalmic ointment Place 1 Application into both eyes 2 (two) times daily. 01/04/22   [provider]  HYDROcodone-acetaminophen (NORCO) 5-325 MG tablet Take 1 tablet by mouth daily as needed. 06/08/22   Tarry Kos, MD  Menthol, Topical Analgesic, (ICY HOT EX) Apply 1 Application topically daily as needed (pain).    [provider]  methocarbamol (ROBAXIN) 500 MG tablet Take 1  tablet (500 mg total) by mouth 2 (two) times daily as needed. 03/15/22   Cristie Hem, PA-C  metoprolol succinate (TOPROL-XL) 25 MG 24 hr tablet Take 25 mg by mouth daily. 04/18/21   [provider]  Multiple Vitamins-Minerals (LIVER DETOX PO) Take 2 tablets by mouth daily.    [provider]  ondansetron (ZOFRAN) 4 MG tablet Take 1 tablet (4 mg total) by mouth every 8 (eight) hours as needed for nausea or vomiting. 03/15/22   Cristie Hem, PA-C  OVER THE COUNTER MEDICATION Take 1 tablet by mouth 2 (two) times daily. Beet extract    [provider]  oxybutynin (DITROPAN) 5 MG tablet Take 5 mg by mouth daily. 04/18/21   [provider]  oxyCODONE-acetaminophen (PERCOCET) 5-325 MG tablet Take 1-2 tablets by mouth every 6 (six) hours as needed for severe pain. 03/22/22 03/22/23  Cristie Hem, PA-C  tamsulosin (FLOMAX) 0.4 MG CAPS capsule Take 0.4 mg by mouth daily.    [provider]  TURMERIC PO Take 1 tablet by mouth daily.    [provider]  venlafaxine XR (EFFEXOR-XR) 150 MG 24 hr capsule Take 150 mg by mouth daily. 04/18/21   [provider]  Vitamin D, Ergocalciferol, (DRISDOL) 1.25 MG (50000 UNIT) CAPS capsule Take 50,000 Units by mouth every Monday.    [provider]    Physical Exam: Vitals:   07/12/22 2030 07/12/22 2115 07/12/22 2126 07/12/22 2145  BP: (!) 84/56 106/71  (!) 103/48  Pulse: (!) 48 76  61  Resp: 13 18  19   Temp:   98.7 F (37.1 C)   TempSrc:   Rectal   SpO2: 97% 97%  98%  Weight:      Height:       *** Data Reviewed: {Tip this will not be part of the note when signed- Document your independent interpretation of telemetry tracing, EKG, lab, Radiology test or any other diagnostic tests. Add any new diagnostic test ordered today. (Optional):26781} {Results:26384}  Assessment and Plan: No notes have been filed under this hospital service. Service: Hospitalist     Advance Care  Planning:   Code Status: Prior ***  Consults: ***  Family Communication: ***  Severity of Illness: {Observation/Inpatient:21159}  Author: , DO 07/12/2022 11:20 PM  For on call review [email or web address].

## 2022-07-13 NOTE — ED Notes (Signed)
Lunch tray delivered.

## 2022-07-13 NOTE — Assessment & Plan Note (Signed)
-  Has intermittent bradycardia. On my evaluation, HR in the 40s at rest and up to 60s with exertion. Suspect likely induced by heavy alcohol use. Pt has hx of withdrawal in the past and father found empty liquor bottles throughout room today. -check ETOH, UDS -keep on continuous telemetry overnight -EKG without any AV blocks
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
## 2022-07-13 NOTE — Assessment & Plan Note (Signed)
-  no signs of withdrawal at this time but has ongoing heavy alcohol use per father who lives with him -keep on CIWA protocol

## 2022-07-13 NOTE — Consult Note (Signed)
  Attempted to see patient x 2, pharmacy and other interdisciplinary services were present both times. Psychiatry consult service will reassess tomorrow once patient is on the floor. Per nursing staff he has been admitted and bed is now available.

## 2022-07-13 NOTE — Assessment & Plan Note (Signed)
Erythromycin ointment TID
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
## 2022-07-13 NOTE — ED Notes (Signed)
Condom cath was off when nurse went in room. IV re-taped. Gown changed. Transport in room to get pt. Pt just finished eating lunch.

## 2022-07-13 NOTE — Progress Notes (Signed)
Necrosis on toes b/l noted. MD paged. Consider wound consult.

## 2022-07-13 NOTE — ED Notes (Addendum)
Bed wet. Bed and gown changed. New condom cath placed. Warm blankets provided.

## 2022-07-13 NOTE — Progress Notes (Signed)
Pt admitted to 6E AxOx4, VS wnL and as per flow. Pt oriented to 6E processes. Pt familiar with the [facility] system. All questions and concerns addressed. Call bell placed within reach, will continue to monitor and maintain safety.  ?

## 2022-07-13 NOTE — Assessment & Plan Note (Signed)
Continue Eliquis -digoxin is on medication list but levels are low most likely due to non-compliance. Will hold for now.

## 2022-07-13 NOTE — ED Notes (Signed)
DO Tu made aware of pts HR of 30's/40's.

## 2022-07-14 DIAGNOSIS — F1090 Alcohol use, unspecified, uncomplicated: Secondary | ICD-10-CM | POA: Diagnosis not present

## 2022-07-14 DIAGNOSIS — R001 Bradycardia, unspecified: Secondary | ICD-10-CM | POA: Diagnosis not present

## 2022-07-14 DIAGNOSIS — R454 Irritability and anger: Secondary | ICD-10-CM

## 2022-07-14 DIAGNOSIS — F4310 Post-traumatic stress disorder, unspecified: Secondary | ICD-10-CM | POA: Diagnosis not present

## 2022-07-14 LAB — CBC WITH DIFFERENTIAL/PLATELET
Abs Immature Granulocytes: 0.02 10*3/uL (ref 0.00–0.07)
Basophils Absolute: 0 10*3/uL (ref 0.0–0.1)
Basophils Relative: 1 %
Eosinophils Absolute: 0.2 10*3/uL (ref 0.0–0.5)
Eosinophils Relative: 4 %
HCT: 33 % — ABNORMAL LOW (ref 39.0–52.0)
Hemoglobin: 10.9 g/dL — ABNORMAL LOW (ref 13.0–17.0)
Immature Granulocytes: 1 %
Lymphocytes Relative: 15 %
Lymphs Abs: 0.6 10*3/uL — ABNORMAL LOW (ref 0.7–4.0)
MCH: 31.9 pg (ref 26.0–34.0)
MCHC: 33 g/dL (ref 30.0–36.0)
MCV: 96.5 fL (ref 80.0–100.0)
Monocytes Absolute: 0.7 10*3/uL (ref 0.1–1.0)
Monocytes Relative: 16 %
Neutro Abs: 2.7 10*3/uL (ref 1.7–7.7)
Neutrophils Relative %: 63 %
Platelets: 95 10*3/uL — ABNORMAL LOW (ref 150–400)
RBC: 3.42 MIL/uL — ABNORMAL LOW (ref 4.22–5.81)
RDW: 14.5 % (ref 11.5–15.5)
WBC: 4.2 10*3/uL (ref 4.0–10.5)
nRBC: 0 % (ref 0.0–0.2)

## 2022-07-14 LAB — BASIC METABOLIC PANEL
Anion gap: 9 (ref 5–15)
BUN: 13 mg/dL (ref 8–23)
CO2: 26 mmol/L (ref 22–32)
Calcium: 8.2 mg/dL — ABNORMAL LOW (ref 8.9–10.3)
Chloride: 104 mmol/L (ref 98–111)
Creatinine, Ser: 0.88 mg/dL (ref 0.61–1.24)
GFR, Estimated: >60 mL/min (ref >60)
Glucose, Bld: 119 mg/dL — ABNORMAL HIGH (ref 70–99)
Potassium: 3.6 mmol/L (ref 3.5–5.1)
Sodium: 139 mmol/L (ref 135–145)

## 2022-07-14 LAB — MAGNESIUM: Magnesium: 1.6 mg/dL — ABNORMAL LOW (ref 1.7–2.4)

## 2022-07-14 LAB — BRAIN NATRIURETIC PEPTIDE: B Natriuretic Peptide: 385.9 pg/mL — ABNORMAL HIGH (ref 0.0–100.0)

## 2022-07-14 NOTE — Consult Note (Addendum)
[facility] Face-to-Face Psychiatry Consult   Reason for Consult: Arguments with father at home, states he took out a gun, currently no intentions of suicide or homicide. Referring Physician:  Dr. Thedore Myers Patient Identification: Taylor Myers MRN:  [id number] Principal Diagnosis: Bradycardia Diagnosis:  Principal Problem:   Bradycardia Active Problems:   Essential hypertension   Atrial fibrillation (HCC)   Hypokalemia   Hypomagnesemia   Hypocalcemia   Alcohol use   Bacterial conjunctivitis of right eye   Candidal dermatitis   Falls, initial encounter   Total Time spent with patient: 45 minutes  Subjective:   Taylor Myers is a 71 y.o. male patient admitted with afib with bradycardia. He carries the psychiatric diagnoses of PTSD and EtOH use disorder and has a past medical history of  atrial fibrillation on chronic anticoagulation, hypertension, wheelchair-bound status. Psychiatry was consulted for argument with dad, and pulled gun.    On initial examination, patient was lying in bed, pleasant and answered all questions. Patient tells me that he moved to [place] from [place], with his father who was 57 years old.  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  He reports he has 1 brother who resides in [place].  He reports history of alcohol use, however has been sober for almost 1 year.  He denies any acute alcohol withdrawal related symptoms.  Currently on interview, the patient. Alert and oriented x4. Patient reports presenting to the emergency room after suffering a fall, and argument with his father in which he pulled out a gun.  Patient states "it was a stupid decision on both of our behalf.  We have to offer meals, with [employer] experience.  Her father is also sundowning, becoming very forgetful. " Over the past couple months he reports increased in arguments, secondary to his forgetfulness and  suspected dementia.  Patient states "I cannot lock myself in the room all the time to avoid these things."  Patient denies any depression, anxiety, mania, psychosis, and or trauma/fear symptoms.  He does report some symptoms of irritability, when arguing with his father however has been previously well-controlled and unclear as to why he pulled his gun out.  He denies having any intentions on using it to inflict harm to self and/or others.  He further reports police have confiscated his gun.  He now denies access to guns, weapons.    Patient does not appear acutely manic on exam and he does not elicit or endorse any current psychotic symptoms. Patient currently denies SI/HI/AVH.  Recommend outpatient psychiatric therapy and group support for caregiver role strain and irritability.   He reports not having any suicidal thoughts. He continues to deny suicidal ideations at this time.  Denies any homicidal ideations, intent or history of violence, aggression, combativeness.   Patient is receiving outpatient services through [facility] community care coordination.  His current primary care provider is Dr. Wylene Myers at [facility].  Last outpatient visit October 12, for low heart rate.   Collateral from father Taylor Myers): He has cleared the house of all weapons and guns at this time. He reports this is the first time he has pulled out a gun on him. I took away his Taylor Myers swords, and that is his hobby. He had them laying all around the house. He didn't like that, and it is probably a one time thing. He is usually nice to me and we help  each other. I do miss him and hope he comes home today.   HPI:  Taylor Myers is a 71 year old male with above noted medical history. Patient presented to the ED on 11/20 via EMS for evaluation of frequent falls and altered mental status. Patient reported 2 falls and recent confusion in the days prior to admission. Patient has a history of alcohol abuse with  withdrawal and admitting physician reportedly spoke with patient's father who found "lots of empty liquor bottles" in the patient's room the night he presented to the hospital.  Past Psychiatric History: PTSD and ETOH use disorder. Currently on no psychotropic medications at this time.  He is prescribed venlafaxine extended release 150 mg p.o. daily, last dispense August 22 90-day prescription.  However patient has not started this medication.  Risk to Self:  Denies Risk to Others:   Denies Prior Inpatient Therapy:   Denies Prior Outpatient Therapy:   Denies  Past Medical History:  Past Medical History:  Diagnosis Date   A-fib (HCC)    Alcohol abuse, in remission    Arthritis    Asthma    child hood asthma   History of kidney stones    "dormant kidney stone"   Hypertension    Wheelchair dependent     Past Surgical History:  Procedure Laterality Date   ANKLE SURGERY Right    APPLICATION OF WOUND VAC Right 03/19/2022   Procedure: APPLICATION OF WOUND VAC;  Surgeon: Tarry KosXu, Naiping M, MD;  Location: [facility] OR;  Service: Orthopedics;  Laterality: Right;   HIP SURGERY Right    total replacement   KNEE SURGERY     bilateral replacements   ORIF HUMERUS FRACTURE Right 03/19/2022   Procedure: OPEN REDUCTION INTERNAL FIXATION (ORIF) RIGHT DISTAL HUMERUS FRACTURE;  Surgeon: Tarry KosXu, Naiping M, MD;  Location: [facility] OR;  Service: Orthopedics;  Laterality: Right;   Family History: No family history on file. Family Psychiatric  History: Dementia Social History:  Social History   Substance and Sexual Activity  Alcohol Use Yes   Alcohol/week: 2.0 standard drinks of alcohol   Types: 2 Standard drinks or equivalent per week   Comment: occasionally     Social History   Substance and Sexual Activity  Drug Use Not Currently    Social History   Socioeconomic History   Marital status: Divorced    Spouse name: Not on file   Number of children: Not on file   Years of education: Not on file   Highest  education level: Not on file  Occupational History   Not on file  Tobacco Use   Smoking status: Former    Types: Cigarettes   Smokeless tobacco: Never  Vaping Use   Vaping Use: Never used  Substance and Sexual Activity   Alcohol use: Yes    Alcohol/week: 2.0 standard drinks of alcohol    Types: 2 Standard drinks or equivalent per week    Comment: occasionally   Drug use: Not Currently   Sexual activity: Not on file  Other Topics Concern   Not on file  Social History Narrative   Not on file   Social Determinants of Health   Financial Resource Strain: Not on file  Food Insecurity: Not on file  Transportation Needs: Not on file  Physical Activity: Not on file  Stress: Not on file  Social Connections: Not on file   Additional Social History:    Allergies:  No Known Allergies  Labs:  Results for orders placed  or performed during the hospital encounter of 07/12/22 (from the past 48 hour(s))  CBC with Differential     Status: Abnormal   Collection Time: 07/12/22  3:34 PM  Result Value Ref Range   WBC 6.9 4.0 - 10.5 K/uL   RBC 3.57 (L) 4.22 - 5.81 MIL/uL   Hemoglobin 11.2 (L) 13.0 - 17.0 g/dL   HCT 16.1 (L) 09.6 - 04.5 %   MCV 94.4 80.0 - 100.0 fL   MCH 31.4 26.0 - 34.0 pg   MCHC 33.2 30.0 - 36.0 g/dL   RDW 40.9 81.1 - 91.4 %   Platelets 83 (L) 150 - 400 K/uL    Comment: Immature Platelet Fraction may be clinically indicated, consider ordering this additional test [id number] REPEATED TO VERIFY PLATELET COUNT CONFIRMED BY SMEAR    nRBC 0.0 0.0 - 0.2 %   Neutrophils Relative % 75 %   Neutro Abs 5.1 1.7 - 7.7 K/uL   Lymphocytes Relative 12 %   Lymphs Abs 0.8 0.7 - 4.0 K/uL   Monocytes Relative 11 %   Monocytes Absolute 0.8 0.1 - 1.0 K/uL   Eosinophils Relative 2 %   Eosinophils Absolute 0.1 0.0 - 0.5 K/uL   Basophils Relative 0 %   Basophils Absolute 0.0 0.0 - 0.1 K/uL   Immature Granulocytes 0 %   Abs Immature Granulocytes 0.03 0.00 - 0.07 K/uL    Comment:  Performed at [facility], [address] [place], [place] [address]  Comprehensive metabolic panel     Status: Abnormal   Collection Time: 07/12/22  3:34 PM  Result Value Ref Range   Sodium 140 135 - 145 mmol/L   Potassium 2.9 (L) 3.5 - 5.1 mmol/L   Chloride 103 98 - 111 mmol/L   CO2 23 22 - 32 mmol/L   Glucose, Bld 105 (H) 70 - 99 mg/dL    Comment: Glucose reference range applies only to samples taken after fasting for at least 8 hours.   BUN 21 8 - 23 mg/dL   Creatinine, Ser 7.84 0.61 - 1.24 mg/dL   Calcium 8.6 (L) 8.9 - 10.3 mg/dL   Total Protein 6.3 (L) 6.5 - 8.1 g/dL   Albumin 3.4 (L) 3.5 - 5.0 g/dL   AST 34 15 - 41 U/L   ALT 17 0 - 44 U/L   Alkaline Phosphatase 70 38 - 126 U/L   Total Bilirubin 1.6 (H) 0.3 - 1.2 mg/dL   GFR, Estimated >69 >62 mL/min    Comment: (NOTE) Calculated using the CKD-EPI Creatinine Equation (2021)    Anion gap 14 5 - 15    Comment: Performed at [facility], [address] [place], [place] [address]  Lactic acid, plasma     Status: None   Collection Time: 07/12/22  3:34 PM  Result Value Ref Range   Lactic Acid, Venous 1.3 0.5 - 1.9 mmol/L    Comment: Performed at [facility], [address], [place], [place] [address]  Blood culture (routine x 2)     Status: None (Preliminary result)   Collection Time: 07/12/22  3:37 PM   Specimen: BLOOD LEFT FOREARM  Result Value Ref Range   Specimen Description BLOOD LEFT FOREARM    Special Requests      BOTTLES DRAWN AEROBIC AND ANAEROBIC Blood Culture adequate volume   Culture      NO GROWTH 2 DAYS Performed at [facility], [address] [place], [place] [address]  Report Status PENDING   Blood culture (routine x 2)     Status: None (Preliminary result)   Collection Time: 07/12/22  3:43 PM   Specimen: BLOOD LEFT FOREARM  Result Value Ref Range   Specimen Description BLOOD LEFT FOREARM    Special Requests      BOTTLES DRAWN AEROBIC AND ANAEROBIC Blood Culture results  may not be optimal due to an inadequate volume of blood received in culture bottles   Culture      NO GROWTH 2 DAYS Performed at [facility] Lab, [address] [place], [place] [address]    Report Status PENDING   Lactic acid, plasma     Status: None   Collection Time: 07/12/22  5:52 PM  Result Value Ref Range   Lactic Acid, Venous 0.9 0.5 - 1.9 mmol/L    Comment: Performed at [facility], [address], [place], [place] [address]  Urinalysis, Routine w reflex microscopic Urine, In & Out Cath     Status: Abnormal   Collection Time: 07/12/22  9:24 PM  Result Value Ref Range   Color, Urine AMBER (A) YELLOW    Comment: BIOCHEMICALS MAY BE AFFECTED BY COLOR   APPearance CLEAR CLEAR   Specific Gravity, Urine 1.021 1.005 - 1.030   pH 5.0 5.0 - 8.0   Glucose, UA NEGATIVE NEGATIVE mg/dL   Hgb urine dipstick NEGATIVE NEGATIVE   Bilirubin Urine NEGATIVE NEGATIVE   Ketones, ur 20 (A) NEGATIVE mg/dL   Protein, ur 30 (A) NEGATIVE mg/dL   Nitrite NEGATIVE NEGATIVE   Leukocytes,Ua NEGATIVE NEGATIVE   RBC / HPF 0-5 0 - 5 RBC/hpf   WBC, UA 0-5 0 - 5 WBC/hpf   Bacteria, UA NONE SEEN NONE SEEN   Squamous Epithelial / LPF 0-5 0 - 5   Mucus PRESENT     Comment: Performed at [facility], [address], [place], [place] [address]  Rapid urine drug screen (hospital performed)     Status: Abnormal   Collection Time: 07/12/22  9:24 PM  Result Value Ref Range   Opiates POSITIVE (A) NONE DETECTED   Cocaine NONE DETECTED NONE DETECTED   Benzodiazepines NONE DETECTED NONE DETECTED   Amphetamines NONE DETECTED NONE DETECTED   Tetrahydrocannabinol NONE DETECTED NONE DETECTED   Barbiturates NONE DETECTED NONE DETECTED    Comment: (NOTE) DRUG SCREEN FOR MEDICAL PURPOSES ONLY.  IF CONFIRMATION IS NEEDED FOR ANY PURPOSE, NOTIFY LAB WITHIN 5 DAYS.  LOWEST DETECTABLE LIMITS FOR URINE DRUG SCREEN Drug Class                     Cutoff (ng/mL) Amphetamine and metabolites     1000 Barbiturate and metabolites    200 Benzodiazepine                 200 Opiates and metabolites        300 Cocaine and metabolites        300 THC                            50 Performed at [facility] Lab, [address], [place], [place] [address]   Digoxin level     Status: Abnormal   Collection Time: 07/12/22  9:45 PM  Result Value Ref Range   Digoxin Level 0.7 (L) 0.8 - 2.0 ng/mL    Comment: Performed at [facility], [address], [place], [place] [address]  Magnesium     Status: Abnormal   Collection Time: 07/12/22  9:45 PM  Result Value Ref Range   Magnesium 1.5 (L) 1.7 - 2.4 mg/dL    Comment: Performed at [facility], [address], [place], [place] [address]  Ethanol     Status: None   Collection Time: 07/13/22  1:09 AM  Result Value Ref Range   Alcohol, Ethyl (B) <10 <10 mg/dL    Comment: (NOTE) Lowest detectable limit for serum alcohol is 10 mg/dL.  For medical purposes only. Performed at [facility], [address], [place], [place] [address]   Basic metabolic panel     Status: Abnormal   Collection Time: 07/13/22  1:09 AM  Result Value Ref Range   Sodium 140 135 - 145 mmol/L   Potassium 3.7 3.5 - 5.1 mmol/L   Chloride 104 98 - 111 mmol/L   CO2 23 22 - 32 mmol/L   Glucose, Bld 85 70 - 99 mg/dL    Comment: Glucose reference range applies only to samples taken after fasting for at least 8 hours.   BUN 13 8 - 23 mg/dL   Creatinine, Ser 9.14 0.61 - 1.24 mg/dL   Calcium 7.8 (L) 8.9 - 10.3 mg/dL   GFR, Estimated >78 >29 mL/min    Comment: (NOTE) Calculated using the CKD-EPI Creatinine Equation (2021)    Anion gap 13 5 - 15    Comment: Performed at [facility], [address], [place], [place] [address]  Brain natriuretic peptide     Status: Abnormal   Collection Time: 07/13/22  4:00 AM  Result Value Ref Range   B Natriuretic Peptide 263.3 (H) 0.0 - 100.0 pg/mL    Comment: Performed at [facility], [address], [place], [place] [address]  CBC     Status: Abnormal   Collection Time: 07/13/22  4:00 AM  Result Value Ref Range   WBC 5.1 4.0 - 10.5 K/uL   RBC 3.24 (L) 4.22 - 5.81 MIL/uL   Hemoglobin 10.6 (L) 13.0 - 17.0 g/dL   HCT 84.6 (L) 96.2 - 95.2 %   MCV 95.7 80.0 - 100.0 fL   MCH 32.7 26.0 - 34.0 pg   MCHC 34.2 30.0 - 36.0 g/dL   RDW 84.1 32.4 - 40.1 %   Platelets 81 (L) 150 - 400 K/uL    Comment: Immature Platelet Fraction may be clinically indicated, consider ordering this additional test [id number] REPEATED TO VERIFY    nRBC 0.0 0.0 - 0.2 %    Comment: Performed at [facility], [address] [place], [place] [address]  C-reactive protein     Status: Abnormal   Collection Time: 07/13/22  6:10 AM  Result Value Ref Range   CRP 3.5 (H) <1.0 mg/dL    Comment: Performed at [facility], [address] [place], [place] [address]  Magnesium     Status: None   Collection Time: 07/13/22  6:10 AM  Result Value Ref Range   Magnesium 1.9 1.7 - 2.4 mg/dL    Comment: Performed at [facility], [address] [place], [place] [address]  TSH     Status: None   Collection Time: 07/13/22  6:10 AM  Result Value Ref Range   TSH 0.354 0.350 - 4.500 uIU/mL    Comment: Performed by a 3rd Generation assay with a functional sensitivity of <=0.01 uIU/mL.  Performed at [facility], [address] [place], [place] [address]   Comprehensive metabolic panel     Status: Abnormal   Collection Time: 07/13/22  6:10 AM  Result Value Ref Range   Sodium 139 135 - 145 mmol/L   Potassium 3.7 3.5 - 5.1 mmol/L   Chloride 103 98 - 111 mmol/L   CO2 24 22 - 32 mmol/L   Glucose, Bld 80 70 - 99 mg/dL    Comment: Glucose reference range applies only to samples taken after fasting for at least 8 hours.   BUN 12 8 - 23 mg/dL   Creatinine, Ser 1.01 0.61 - 1.24 mg/dL   Calcium 8.0 (L) 8.9 - 10.3 mg/dL   Total Protein 5.1 (L) 6.5 - 8.1 g/dL   Albumin 2.9 (L) 3.5 - 5.0 g/dL   AST 26 15 - 41 U/L    ALT 17 0 - 44 U/L   Alkaline Phosphatase 63 38 - 126 U/L   Total Bilirubin 1.6 (H) 0.3 - 1.2 mg/dL   GFR, Estimated >75 >10 mL/min    Comment: (NOTE) Calculated using the CKD-EPI Creatinine Equation (2021)    Anion gap 12 5 - 15    Comment: Performed at [facility], [address] [place], [place] [address]  Folate     Status: None   Collection Time: 07/13/22  6:10 AM  Result Value Ref Range   Folate 12.3 >5.9 ng/mL    Comment: Performed at [facility], [address], [place], [place] [address]  Vitamin B12     Status: None   Collection Time: 07/13/22  6:10 AM  Result Value Ref Range   Vitamin B-12 654 180 - 914 pg/mL    Comment: (NOTE) This assay is not validated for testing neonatal or myeloproliferative syndrome specimens for Vitamin B12 levels. Performed at [facility], [address] [place], [place] [address]   Magnesium     Status: Abnormal   Collection Time: 07/14/22  3:25 AM  Result Value Ref Range   Magnesium 1.6 (L) 1.7 - 2.4 mg/dL    Comment: Performed at [facility], [address] [place], [place] [address]  CBC with Differential/Platelet     Status: Abnormal   Collection Time: 07/14/22  3:25 AM  Result Value Ref Range   WBC 4.2 4.0 - 10.5 K/uL   RBC 3.42 (L) 4.22 - 5.81 MIL/uL   Hemoglobin 10.9 (L) 13.0 - 17.0 g/dL   HCT 54.0 (L) 08.6 - 76.1 %   MCV 96.5 80.0 - 100.0 fL   MCH 31.9 26.0 - 34.0 pg   MCHC 33.0 30.0 - 36.0 g/dL   RDW 95.0 93.2 - 67.1 %   Platelets 95 (L) 150 - 400 K/uL    Comment: REPEATED TO VERIFY   nRBC 0.0 0.0 - 0.2 %   Neutrophils Relative % 63 %   Neutro Abs 2.7 1.7 - 7.7 K/uL   Lymphocytes Relative 15 %   Lymphs Abs 0.6 (L) 0.7 - 4.0 K/uL   Monocytes Relative 16 %   Monocytes Absolute 0.7 0.1 - 1.0 K/uL   Eosinophils Relative 4 %   Eosinophils Absolute 0.2 0.0 - 0.5 K/uL   Basophils Relative 1 %   Basophils Absolute 0.0 0.0 - 0.1 K/uL   Immature Granulocytes 1 %   Abs Immature Granulocytes 0.02  0.00 - 0.07 K/uL    Comment: Performed at [facility], [address], [place], [place] [address]  Brain natriuretic peptide     Status: Abnormal   Collection Time: 07/14/22  3:25 AM  Result Value Ref Range   B Natriuretic Peptide 385.9 (H) 0.0 - 100.0 pg/mL    Comment: Performed at [facility] Lab, [address], [place], [place] [address]  Basic metabolic panel     Status: Abnormal   Collection Time: 07/14/22  3:25 AM  Result Value Ref Range   Sodium 139 135 - 145 mmol/L   Potassium 3.6 3.5 - 5.1 mmol/L   Chloride 104 98 - 111 mmol/L   CO2 26 22 - 32 mmol/L   Glucose, Bld 119 (H) 70 - 99 mg/dL    Comment: Glucose reference range applies only to samples taken after fasting for at least 8 hours.   BUN 13 8 - 23 mg/dL   Creatinine, Ser 6.07 0.61 - 1.24 mg/dL   Calcium 8.2 (L) 8.9 - 10.3 mg/dL   GFR, Estimated >37 >10 mL/min    Comment: (NOTE) Calculated using the CKD-EPI Creatinine Equation (2021)    Anion gap 9 5 - 15    Comment: Performed at [facility], [address] [place], [place] [address]    Current Facility-Administered Medications  Medication Dose Route Frequency Provider Last Rate Last Admin   acetaminophen (TYLENOL) tablet 650 mg  650 mg Oral Q6H PRN Opyd, Lavone Neri, MD   650 mg at 07/14/22 1027   allopurinol (ZYLOPRIM) tablet 300 mg  300 mg Oral Daily Leroy Sea, MD   300 mg at 07/14/22 1027   apixaban (ELIQUIS) tablet 5 mg  5 mg Oral BID Daylene Posey, RPH   5 mg at 07/14/22 1027   atorvastatin (LIPITOR) tablet 40 mg  40 mg Oral Daily Leroy Sea, MD   40 mg at 07/14/22 1027   erythromycin ophthalmic ointment   Right Eye Q6H Tu, Ching T, DO   1 Application at 07/14/22 8546   folic acid (FOLVITE) tablet 1 mg  1 mg Oral Daily Tu, Ching T, DO   1 mg at 07/14/22 1027   HYDROcodone-acetaminophen (NORCO/VICODIN) 5-325 MG per tablet 1 tablet  1 tablet Oral Q4H PRN Opyd, Lavone Neri, MD       LORazepam (ATIVAN) tablet 1-4 mg  1-4 mg Oral Q4H  Tu, Ching T, DO   1 mg at 07/14/22 0050   Or   LORazepam (ATIVAN) injection 1-4 mg  1-4 mg Intravenous Q4H Tu, Ching T, DO       multivitamin with minerals tablet 1 tablet  1 tablet Oral Daily Tu, Ching T, DO   1 tablet at 07/14/22 1027   naphazoline-glycerin (CLEAR EYES REDNESS) ophth solution 2 drop  2 drop Both Eyes Q6H Leroy Sea, MD   2 drop at 07/14/22 2703   nystatin cream (MYCOSTATIN)   Topical BID Tu, Ching T, DO   Given at 07/14/22 1028   tamsulosin (FLOMAX) capsule 0.4 mg  0.4 mg Oral Daily Susa Raring K, MD   0.4 mg at 07/14/22 1027   thiamine (VITAMIN B1) tablet 100 mg  100 mg Oral Daily Tu, Ching T, DO   100 mg at 07/14/22 1027   Or   thiamine (VITAMIN B1) injection 100 mg  100 mg Intravenous Daily Tu, Ching T, DO       [START ON 07/19/2022] Vitamin D (Ergocalciferol) (DRISDOL) 1.25 MG (50000 UNIT) capsule 50,000 Units  50,000 Units Oral Q Juliene Pina, Franquez  K, MD        Musculoskeletal: Strength & Muscle Tone: decreased Gait & Station:  uta, wheel chair boundd Patient leans: Right            Psychiatric Specialty Exam:  Presentation  General Appearance: Appropriate for Environment; Casual  Eye Contact:Good  Speech:Clear and Coherent; Normal Rate  Speech Volume:Normal  Handedness:Right   Mood and Affect  Mood:Euthymic  Affect:Appropriate; Congruent   Thought Process  Thought Processes:Coherent; Linear  Descriptions of Associations:Intact  Orientation:Full (Time, Place and Person)  Thought Content:Logical  History of Schizophrenia/Schizoaffective disorder:No data recorded Duration of Psychotic Symptoms:No data recorded Hallucinations:Hallucinations: None  Ideas of Reference:None  Suicidal Thoughts:Suicidal Thoughts: No  Homicidal Thoughts:Homicidal Thoughts: No   Sensorium  Memory:Immediate Good; Remote Good; Recent Good  Judgment:Fair  Insight:Good   Executive Functions  Concentration:Good  Attention  Span:Fair  Recall:Good  Fund of Knowledge:Good  Language:Good   Psychomotor Activity  Psychomotor Activity:Psychomotor Activity: Normal   Assets  Assets:Financial Resources/Insurance; Housing; Leisure Time; Talents/Skills; Social Support; Resilience; Physical Health; Desire for Improvement; Communication Skills   Sleep  Sleep:Sleep: Good   Physical Exam: Physical Exam Vitals and nursing note reviewed.  Constitutional:      Appearance: Normal appearance. He is normal weight.  HENT:     Head: Normocephalic.     Mouth/Throat:     Mouth: Mucous membranes are moist.  Skin:    General: Skin is warm.     Capillary Refill: Capillary refill takes less than 2 seconds.  Neurological:     General: No focal deficit present.     Mental Status: He is alert and oriented to person, place, and time. Mental status is at baseline.  Psychiatric:        Mood and Affect: Mood normal.        Behavior: Behavior normal.        Thought Content: Thought content normal.        Judgment: Judgment normal.    Review of Systems  Psychiatric/Behavioral: Negative.  Negative for depression, hallucinations, memory loss, substance abuse and suicidal ideas. The patient is not nervous/anxious and does not have insomnia.   All other systems reviewed and are negative.  Blood pressure (!) 140/77, pulse 93, temperature 99.9 F (37.7 C), temperature source Oral, resp. rate 20, height 5\' 9"  (1.753 m), weight 68 kg, SpO2 100 %. Body mass index is 22.14 kg/m.  In brief, 71 year old male, history of PTSD ,ETOH abuse who was admitted for afib. He endorsed to admitting provider, after argument he pulled gun out.  At this time he is alert and oriented, and appears to be much more improved. He has no evidence of imminent risk to self or others at present.  Psychiatric admission not warranted at this time. He has the capacity to make decisions regarding treatment, alternatives, and placement. Will remove from consult  list at this time.   Treatment Plan Summary: Plan continue current recommendations. PTSD; stable  Irritability: -Recommend support groups for caregiver role strain. -May benefit from low-dose hydroxyzine, for agitation.  Disposition and safety Patient currently does not meet criteria for involuntary commitment and on a voluntary inpatient hospitalization.  While he does have risk factors and suicide ideation/homicide ideations cannot be predicted.  Patient has low risk for both. He denies having access to guns at this time.  Alcohol use disorder; stable -Blood alcohol level negative on admission.  Patient denies any recent alcohol use.  No acute concerns at this time.  Psychiatric consult service to sign off at this time. Disposition: No evidence of imminent risk to self or others at present.   Patient does not meet criteria for psychiatric inpatient admission. Supportive therapy provided about ongoing stressors. Discussed crisis plan, support from social network, calling 911, coming to the Emergency Department, and calling Suicide Hotline.  Maryagnes Amos, FNP 07/14/2022 12:47 PM

## 2022-07-14 NOTE — Progress Notes (Signed)
PROGRESS NOTE  Taylor Myers UYQ:[id number] DOB: [birth year] DOA: 07/12/2022 PCP: Gaspar Garbe, MD   LOS: 1 day   Brief Narrative / Interim history: 71 year old male with PAF on Eliquis, HTN, PVD, asthma, DM2, recent right elbow surgery, recent facial reconstruction surgery, ongoing EtOH use comes to the hospital with confusion, hypotension, bradycardia.  Cardiology was consulted.  Subjective / 24h Interval events: He is doing much better this morning, feels back to normal.  Assesement and Plan: Principal Problem:   Bradycardia Active Problems:   Essential hypertension   Atrial fibrillation (HCC)   Hypokalemia   Hypomagnesemia   Hypocalcemia   Alcohol use   Bacterial conjunctivitis of right eye   Candidal dermatitis   Falls, initial encounter   Principal problem Generalized weakness, falls, intermittent confusion in the setting of dehydration, hypotension and bradycardia -he appears dehydrated on admission, received IV fluids with improvement.  He feels stronger, back to baseline.  Cardiology was consulted for his bradycardia and his digoxin and metoprolol have been held.  Rates better.  TSH unremarkable at 0.354. -Continue Eliquis  Active problems EtOH use-patient tells me he has not had anything to drink in the past 10 days.  He is not exhibiting any withdrawals.  EtOH level was negative on admission  Concern for homicidal ideation-apparently he got into an argument with his dad and said he took out the gun.  Psychiatry consulted, evaluated patient today, appreciate input.  He denies any harmful intent towards his father, states that it was a simple argument.  Per psychiatry, he does not meet criteria for IVC or psych hospitalization  Paroxysmal atrial fibrillation with bradycardia-he has previous history of stroke with minimal residual left-sided weakness, as above, digoxin as well as metoprolol are on hold.  Appreciate cardiology follow-up   History of severe right eye  dryness with corneal abrasion and injury-Continue eyedrops, eye ointment, he is due for corneal reconstruction surgery with his ophthalmologist, postdischarge follow-up with him.   History of stroke with minimal left-sided residual weakness-Supportive care.  Continue Eliquis and statin for secondary prevention.  PT OT.   Recent right elbow injury requiring surgery with Dr. Lorenza Evangelist has improving right elbow swelling, continue PT OT for range of motion follow-up with orthopedics postdischarge.   Candidal dermatitis - Nystatin ointment twice daily to right intertriginous/groin region    Hypocalcemia, hypokalemia, hypomagnesemia-replace and continue to monitor    History of hypertension -Dehydrated on admission, status post IV fluids.  Blood pressure better, stable off metoprolol   Generalized weakness and deconditioning-Wheelchair-bound.  PT OT.  Lives with elderly father, monitor closely.   BPH-Flomax.  Scheduled Meds:  allopurinol  300 mg Oral Daily   apixaban  5 mg Oral BID   atorvastatin  40 mg Oral Daily   erythromycin   Right Eye Q6H   folic acid  1 mg Oral Daily   LORazepam  1-4 mg Oral Q4H   Or   LORazepam  1-4 mg Intravenous Q4H   multivitamin with minerals  1 tablet Oral Daily   naphazoline-glycerin  2 drop Both Eyes Q6H   nystatin cream   Topical BID   tamsulosin  0.4 mg Oral Daily   thiamine  100 mg Oral Daily   Or   thiamine  100 mg Intravenous Daily   [START ON 07/19/2022] Vitamin D (Ergocalciferol)  50,000 Units Oral Q Mon   Continuous Infusions: PRN Meds:.acetaminophen, HYDROcodone-acetaminophen  Current Outpatient Medications  Medication Instructions   Aleve 220-440 mg, Oral, 2 times daily  PRN   allopurinol (ZYLOPRIM) 300 mg, Oral, Daily   amLODipine (NORVASC) 5 mg, Oral, Daily   atorvastatin (LIPITOR) 40 mg, Oral, Daily   CLEAR EYES PURE RELIEF PF 0.25 % SOLN 1 drop, Both Eyes, See admin instructions, Instill 1 drop into both eyes two to three times a day as  needed for irritation   DHA-EPA-Flaxseed Oil-Vitamin E (THERATEARS NUTRITION) CAPS 1 capsule, Oral, Daily   digoxin (DIGOX) 0.125 mg, Oral, Daily   Eliquis 5 mg, Oral, Daily   erythromycin ophthalmic ointment 1 Application, Right Eye, 2 times daily   HYDROcodone-acetaminophen (NORCO) 5-325 MG tablet 1 tablet, Oral, Daily PRN   Menthol-Camphor (TIGER BALM EXTRA STRENGTH) 11-10 % OINT 1 application , Apply externally, See admin instructions, Apply to affected hips, shoulders, joints, or other painful sites daily as needed for discomfort   methocarbamol (ROBAXIN) 500 mg, Oral, 2 times daily PRN   metoprolol succinate (TOPROL-XL) 25 mg, Oral, Daily   Multiple Vitamins-Minerals (LIVER DETOX PO) 2 tablets, Oral, Daily   NON FORMULARY 2 capsules, Oral, See admin instructions, Beet extract capsules- Take 2 capsules by mouth once a day   ondansetron (ZOFRAN) 4 mg, Oral, Every 8 hours PRN   OVER THE COUNTER MEDICATION 1 tablet, Oral, 2 times daily, Beet extract    oxybutynin (DITROPAN) 5 mg, Oral, Daily   oxyCODONE-acetaminophen (PERCOCET) 5-325 MG tablet 1-2 tablets, Oral, Every 6 hours PRN   PRESCRIPTION MEDICATION 1 drop, Right Eye, See admin instructions, Autologous serum eye drops- Instill 1 drop into the right eye 2 times a day   tamsulosin (FLOMAX) 0.4 mg, Oral, Daily   TURMERIC PO 1 capsule, Oral, Daily   Tylenol 8 Hour Arthritis Pain 650-1,300 mg, Oral, Every 8 hours PRN   venlafaxine XR (EFFEXOR-XR) 150 mg, Oral, Daily   Vitamin D (Ergocalciferol) (DRISDOL) 50,000 Units, Oral, Every Mon    Diet Orders (From admission, onward)     Start     Ordered   07/13/22 1559  Diet Heart Room service appropriate? Yes; Fluid consistency: Thin  Diet effective now       Question Answer Comment  Room service appropriate? Yes   Fluid consistency: Thin      07/13/22 1558            DVT prophylaxis:  apixaban (ELIQUIS) tablet 5 mg   Lab Results  Component Value Date   PLT 95 (L) 07/14/2022       Code Status: Full Code  Family Communication: no family at bedside  Status is: Inpatient Remains inpatient appropriate because: Continue to monitor on telemetry  Level of care: Telemetry Cardiac  Consultants:  Cardiology   Objective: Vitals:   07/14/22 0030 07/14/22 0425 07/14/22 1022 07/14/22 1339  BP: 111/67 118/70 (!) 140/77 102/64  Pulse: 81 94 93 78  Resp: 19 20 20 19   Temp: 98.9 F (37.2 C) 98.2 F (36.8 C) 99.9 F (37.7 C) 98.2 F (36.8 C)  TempSrc: Oral Oral Oral Oral  SpO2: 96% 98% 100% 95%  Weight:      Height:        Intake/Output Summary (Last 24 hours) at 07/14/2022 1437 Last data filed at 07/14/2022 0428 Gross per 24 hour  Intake --  Output 600 ml  Net -600 ml   Wt Readings from Last 3 Encounters:  07/12/22 68 kg  03/19/22 68 kg  09/21/21 72.6 kg    Examination:  Constitutional: NAD Eyes: no scleral icterus ENMT: Mucous membranes are moist.  Neck: normal,  supple Respiratory: clear to auscultation bilaterally, no wheezing, no crackles. Normal respiratory effort. No accessory muscle use.  Cardiovascular: Regular rate and rhythm, no murmurs / rubs / gallops. No LE edema.  Abdomen: non distended, no tenderness. Bowel sounds positive.  Musculoskeletal: no clubbing / cyanosis.   Data Reviewed: I have independently reviewed following labs and imaging studies   CBC Recent Labs  Lab 07/12/22 1534 07/13/22 0400 07/14/22 0325  WBC 6.9 5.1 4.2  HGB 11.2* 10.6* 10.9*  HCT 33.7* 31.0* 33.0*  PLT 83* 81* 95*  MCV 94.4 95.7 96.5  MCH 31.4 32.7 31.9  MCHC 33.2 34.2 33.0  RDW 14.4 14.4 14.5  LYMPHSABS 0.8  --  0.6*  MONOABS 0.8  --  0.7  EOSABS 0.1  --  0.2  BASOSABS 0.0  --  0.0    Recent Labs  Lab 07/12/22 1534 07/12/22 1752 07/12/22 2145 07/13/22 0109 07/13/22 0400 07/13/22 0610 07/14/22 0325  NA 140  --   --  140  --  139 139  K 2.9*  --   --  3.7  --  3.7 3.6  CL 103  --   --  104  --  103 104  CO2 23  --   --  23  --  24  26  GLUCOSE 105*  --   --  85  --  80 119*  BUN 21  --   --  13  --  12 13  CREATININE 0.98  --   --  0.77  --  0.79 0.88  CALCIUM 8.6*  --   --  7.8*  --  8.0* 8.2*  AST 34  --   --   --   --  26  --   ALT 17  --   --   --   --  17  --   ALKPHOS 70  --   --   --   --  63  --   BILITOT 1.6*  --   --   --   --  1.6*  --   ALBUMIN 3.4*  --   --   --   --  2.9*  --   MG  --   --  1.5*  --   --  1.9 1.6*  CRP  --   --   --   --   --  3.5*  --   LATICACIDVEN 1.3 0.9  --   --   --   --   --   TSH  --   --   --   --   --  0.354  --   BNP  --   --   --   --  263.3*  --  385.9*    ------------------------------------------------------------------------------------------------------------------ No results for input(s): "CHOL", "HDL", "LDLCALC", "TRIG", "CHOLHDL", "LDLDIRECT" in the last 72 hours.  No results found for: "HGBA1C" ------------------------------------------------------------------------------------------------------------------ Recent Labs    07/13/22 0610  TSH 0.354    Cardiac Enzymes No results for input(s): "CKMB", "TROPONINI", "MYOGLOBIN" in the last 168 hours.  Invalid input(s): "CK" ------------------------------------------------------------------------------------------------------------------    Component Value Date/Time   BNP 385.9 (H) 07/14/2022 0325    CBG: No results for input(s): "GLUCAP" in the last 168 hours.  Recent Results (from the past 240 hour(s))  Blood culture (routine x 2)     Status: None (Preliminary result)   Collection Time: 07/12/22  3:37 PM   Specimen: BLOOD LEFT FOREARM  Result Value Ref  Range Status   Specimen Description BLOOD LEFT FOREARM  Final   Special Requests   Final    BOTTLES DRAWN AEROBIC AND ANAEROBIC Blood Culture adequate volume   Culture   Final    NO GROWTH 2 DAYS Performed at [facility], [address] [place], [place] [address]    Report Status PENDING  Incomplete  Blood culture (routine x 2)      Status: None (Preliminary result)   Collection Time: 07/12/22  3:43 PM   Specimen: BLOOD LEFT FOREARM  Result Value Ref Range Status   Specimen Description BLOOD LEFT FOREARM  Final   Special Requests   Final    BOTTLES DRAWN AEROBIC AND ANAEROBIC Blood Culture results may not be optimal due to an inadequate volume of blood received in culture bottles   Culture   Final    NO GROWTH 2 DAYS Performed at [facility], [address] [place], [place] [address]    Report Status PENDING  Incomplete     Radiology Studies: ECHOCARDIOGRAM COMPLETE  Result Date: 07/13/2022    ECHOCARDIOGRAM REPORT   Patient Name:   Taylor Myers Date of Exam: 07/13/2022 Medical Rec #:  [id number]   Height:       69.0 in Accession #:    [id number]  Weight:       149.9 lb Date of Birth:  [birth year]  BSA:          1.828 m Patient Age:    71 years    BP:           114/63 mmHg Patient Gender: M           HR:           94 bpm. Exam Location:  Inpatient Procedure: 2D Echo Indications:    atrial fibrillation  History:        Patient has no prior history of Echocardiogram examinations.                 Arrythmias:Bradycardia and Atrial Fibrillation; Risk                 Factors:Diabetes and Hypertension.  Sonographer:    Cathie HoopsWendy Porter Referring Phys: [id number] [facility]  Sonographer Comments: Technically difficult study due to poor echo windows. IMPRESSIONS  1. Left ventricular ejection fraction, by estimation, is 55 to 60%. The left ventricle has normal function. Left ventricular endocardial border not optimally defined to evaluate regional wall motion. Left ventricular diastolic parameters are indeterminate.  2. Right ventricular systolic function is normal. The right ventricular size is normal. Tricuspid regurgitation signal is inadequate for assessing PA pressure.  3. Left atrial size was mildly dilated.  4. Right atrial size was mildly dilated.  5. The mitral valve is grossly normal. No evidence of mitral valve  regurgitation. No evidence of mitral stenosis.  6. The aortic valve was not well visualized. Aortic valve regurgitation is not visualized.  7. The inferior vena cava is normal in size with greater than 50% respiratory variability, suggesting right atrial pressure of 3 mmHg.  8. Technically difficult study. Comparison(s): No prior Echocardiogram. FINDINGS  Left Ventricle: Left ventricular ejection fraction, by estimation, is 55 to 60%. The left ventricle has normal function. Left ventricular endocardial border not optimally defined to evaluate regional wall motion. The left ventricular internal cavity size was normal in size. Suboptimal image quality limits for assessment of left ventricular hypertrophy. Left ventricular diastolic parameters are indeterminate. Right Ventricle: The  right ventricular size is normal. No increase in right ventricular wall thickness. Right ventricular systolic function is normal. Tricuspid regurgitation signal is inadequate for assessing PA pressure. Left Atrium: Left atrial size was mildly dilated. Right Atrium: Right atrial size was mildly dilated. Pericardium: There is no evidence of pericardial effusion. Mitral Valve: The mitral valve is grossly normal. No evidence of mitral valve regurgitation. No evidence of mitral valve stenosis. Tricuspid Valve: The tricuspid valve is not well visualized. Tricuspid valve regurgitation is not demonstrated. Aortic Valve: The aortic valve was not well visualized. Aortic valve regurgitation is not visualized. Aortic valve mean gradient measures 4.3 mmHg. Aortic valve peak gradient measures 8.4 mmHg. Aortic valve area, by VTI measures 2.57 cm. Pulmonic Valve: The pulmonic valve was not well visualized. Pulmonic valve regurgitation is not visualized. Aorta: The aortic root and ascending aorta are structurally normal, with no evidence of dilitation. Venous: The inferior vena cava is normal in size with greater than 50% respiratory variability,  suggesting right atrial pressure of 3 mmHg. IAS/Shunts: No atrial level shunt detected by color flow Doppler.  LEFT VENTRICLE PLAX 2D LVIDd:         5.20 cm      Diastology LVIDs:         3.50 cm      LV e' medial:    9.10 cm/s LV PW:         1.00 cm      LV E/e' medial:  10.0 LV IVS:        1.00 cm      LV e' lateral:   12.07 cm/s LVOT diam:     2.20 cm      LV E/e' lateral: 7.6 LV SV:         56 LV SV Index:   31 LVOT Area:     3.80 cm  LV Volumes (MOD) LV vol d, MOD A2C: 67.3 ml LV vol d, MOD A4C: 127.0 ml LV vol s, MOD A2C: 30.3 ml LV vol s, MOD A4C: 46.3 ml LV SV MOD A2C:     37.0 ml LV SV MOD A4C:     127.0 ml LV SV MOD BP:      54.1 ml RIGHT VENTRICLE RV Basal diam:  3.80 cm RV Mid diam:    3.00 cm RV S prime:     10.80 cm/s TAPSE (M-mode): 1.4 cm LEFT ATRIUM             Index        RIGHT ATRIUM           Index LA diam:        4.10 cm 2.24 cm/m   RA Area:     20.80 cm LA Vol (A2C):   62.0 ml 33.92 ml/m  RA Volume:   61.60 ml  33.70 ml/m LA Vol (A4C):   43.2 ml 23.64 ml/m LA Biplane Vol: 51.3 ml 28.07 ml/m  AORTIC VALVE                    PULMONIC VALVE AV Area (Vmax):    2.28 cm     PV Vmax:       1.03 m/s AV Area (Vmean):   2.41 cm     PV Peak grad:  4.2 mmHg AV Area (VTI):     2.57 cm AV Vmax:           145.33 cm/s AV Vmean:  94.900 cm/s AV VTI:            0.217 m AV Peak Grad:      8.4 mmHg AV Mean Grad:      4.3 mmHg LVOT Vmax:         87.27 cm/s LVOT Vmean:        60.100 cm/s LVOT VTI:          0.147 m LVOT/AV VTI ratio: 0.68  AORTA Ao Root diam: 3.60 cm Ao Asc diam:  3.80 cm MITRAL VALVE MV Area (PHT): 3.59 cm    SHUNTS MV Decel Time: 211 msec    Systemic VTI:  0.15 m MR Peak grad: 44.9 mmHg    Systemic Diam: 2.20 cm MR Vmax:      335.00 cm/s MV E velocity: 91.48 cm/s MV A velocity: 39.80 cm/s MV E/A ratio:  2.30 Riley Lam MD Electronically signed by Riley Lam MD Signature Date/Time: 07/13/2022/5:55:21 PM    Final      Pamella Pert, MD, PhD [facility]  Between 7 am - 7 pm I am available, please contact me via Amion (for emergencies) or Securechat (non urgent messages)  Between 7 pm - 7 am I am not available, please contact night coverage MD/APP via Amion

## 2022-07-14 NOTE — Progress Notes (Addendum)
Rounding Note    Patient Name: Taylor Myers Date of Encounter: 07/14/2022  [place] Cardiologist: None   Subjective   Denies any CP or SOB. Slept good.   Inpatient Medications    Scheduled Meds:  allopurinol  300 mg Oral Daily   apixaban  5 mg Oral BID   atorvastatin  40 mg Oral Daily   erythromycin   Right Eye [id number]   folic acid  1 mg Oral Daily   LORazepam  1-4 mg Oral Q4H   Or   LORazepam  1-4 mg Intravenous Q4H   multivitamin with minerals  1 tablet Oral Daily   naphazoline-glycerin  2 drop Both Eyes Q6H   nystatin cream   Topical BID   tamsulosin  0.4 mg Oral Daily   thiamine  100 mg Oral Daily   Or   thiamine  100 mg Intravenous Daily   [START ON 07/19/2022] Vitamin D (Ergocalciferol)  50,000 Units Oral Q Mon   Continuous Infusions:  PRN Meds: acetaminophen, HYDROcodone-acetaminophen   Vital Signs    Vitals:   07/13/22 1920 07/13/22 1940 07/14/22 0030 07/14/22 0425  BP:  127/69 111/67 118/70  Pulse: 81 84 81 94  Resp: 19 18 19 20   Temp:  98.8 F (37.1 C) 98.9 F (37.2 C) 98.2 F (36.8 C)  TempSrc:  Oral Oral Oral  SpO2: 97% 99% 96% 98%  Weight:      Height:        Intake/Output Summary (Last 24 hours) at 07/14/2022 0753 Last data filed at 07/14/2022 0428 Gross per 24 hour  Intake 550 ml  Output 600 ml  Net -50 ml      07/12/2022    3:29 PM 03/19/2022    9:42 AM 09/21/2021    1:04 PM  Last 3 Weights  Weight (lbs) 149 lb 14.6 oz 150 lb 160 lb  Weight (kg) 68 kg 68.04 kg 72.576 kg      Telemetry    Atrial fibrillation with HR 40-90s. - Personally Reviewed  ECG    Atrial fibrillation with RBBB - Personally Reviewed  Physical Exam   GEN: No acute distress.   Neck: No JVD Cardiac: irregularly irregular, no murmurs, rubs, or gallops.  Respiratory: Clear to auscultation bilaterally. GI: Soft, nontender, non-distended  MS: No edema; No deformity. Neuro:  Nonfocal  Psych: Normal affect   Labs    High Sensitivity  Troponin:  No results for input(s): "TROPONINIHS" in the last 720 hours.   Chemistry Recent Labs  Lab 07/12/22 1534 07/12/22 2145 07/13/22 0109 07/13/22 0610 07/14/22 0325  NA 140  --  140 139 139  K 2.9*  --  3.7 3.7 3.6  CL 103  --  104 103 104  CO2 23  --  23 24 26   GLUCOSE 105*  --  85 80 119*  BUN 21  --  13 12 13   CREATININE 0.98  --  0.77 0.79 0.88  CALCIUM 8.6*  --  7.8* 8.0* 8.2*  MG  --  1.5*  --  1.9 1.6*  PROT 6.3*  --   --  5.1*  --   ALBUMIN 3.4*  --   --  2.9*  --   AST 34  --   --  26  --   ALT 17  --   --  17  --   ALKPHOS 70  --   --  63  --   BILITOT 1.6*  --   --  1.6*  --   GFRNONAA >60  --  >60 >60 >60  ANIONGAP 14  --  13 12 9     Lipids No results for input(s): "CHOL", "TRIG", "HDL", "LABVLDL", "LDLCALC", "CHOLHDL" in the last 168 hours.  Hematology Recent Labs  Lab 07/12/22 1534 07/13/22 0400 07/14/22 0325  WBC 6.9 5.1 4.2  RBC 3.57* 3.24* 3.42*  HGB 11.2* 10.6* 10.9*  HCT 33.7* 31.0* 33.0*  MCV 94.4 95.7 96.5  MCH 31.4 32.7 31.9  MCHC 33.2 34.2 33.0  RDW 14.4 14.4 14.5  PLT 83* 81* 95*   Thyroid  Recent Labs  Lab 07/13/22 0610  TSH 0.354    BNP Recent Labs  Lab 07/13/22 0400 07/14/22 0325  BNP 263.3* 385.9*    DDimer No results for input(s): "DDIMER" in the last 168 hours.   Radiology    ECHOCARDIOGRAM COMPLETE  Result Date: 07/13/2022    ECHOCARDIOGRAM REPORT   Patient Name:   Taylor Myers Date of Exam: 07/13/2022 Medical Rec #:  [id number]   Height:       69.0 in Accession #:    [id number]  Weight:       149.9 lb Date of Birth:  11-09-1950  BSA:          1.828 m Patient Age:    71 years    BP:           114/63 mmHg Patient Gender: M           HR:           94 bpm. Exam Location:  Inpatient Procedure: 2D Echo Indications:    atrial fibrillation  History:        Patient has no prior history of Echocardiogram examinations.                 Arrythmias:Bradycardia and Atrial Fibrillation; Risk                 Factors:Diabetes and  Hypertension.  Sonographer:    Harvie Junior Referring Phys: [id number] [facility]  Sonographer Comments: Technically difficult study due to poor echo windows. IMPRESSIONS  1. Left ventricular ejection fraction, by estimation, is 55 to 60%. The left ventricle has normal function. Left ventricular endocardial border not optimally defined to evaluate regional wall motion. Left ventricular diastolic parameters are indeterminate.  2. Right ventricular systolic function is normal. The right ventricular size is normal. Tricuspid regurgitation signal is inadequate for assessing PA pressure.  3. Left atrial size was mildly dilated.  4. Right atrial size was mildly dilated.  5. The mitral valve is grossly normal. No evidence of mitral valve regurgitation. No evidence of mitral stenosis.  6. The aortic valve was not well visualized. Aortic valve regurgitation is not visualized.  7. The inferior vena cava is normal in size with greater than 50% respiratory variability, suggesting right atrial pressure of 3 mmHg.  8. Technically difficult study. Comparison(s): No prior Echocardiogram. FINDINGS  Left Ventricle: Left ventricular ejection fraction, by estimation, is 55 to 60%. The left ventricle has normal function. Left ventricular endocardial border not optimally defined to evaluate regional wall motion. The left ventricular internal cavity size was normal in size. Suboptimal image quality limits for assessment of left ventricular hypertrophy. Left ventricular diastolic parameters are indeterminate. Right Ventricle: The right ventricular size is normal. No increase in right ventricular wall thickness. Right ventricular systolic function is normal. Tricuspid regurgitation signal is inadequate for assessing PA pressure. Left Atrium: Left atrial size was mildly  dilated. Right Atrium: Right atrial size was mildly dilated. Pericardium: There is no evidence of pericardial effusion. Mitral Valve: The mitral valve is grossly normal. No  evidence of mitral valve regurgitation. No evidence of mitral valve stenosis. Tricuspid Valve: The tricuspid valve is not well visualized. Tricuspid valve regurgitation is not demonstrated. Aortic Valve: The aortic valve was not well visualized. Aortic valve regurgitation is not visualized. Aortic valve mean gradient measures 4.3 mmHg. Aortic valve peak gradient measures 8.4 mmHg. Aortic valve area, by VTI measures 2.57 cm. Pulmonic Valve: The pulmonic valve was not well visualized. Pulmonic valve regurgitation is not visualized. Aorta: The aortic root and ascending aorta are structurally normal, with no evidence of dilitation. Venous: The inferior vena cava is normal in size with greater than 50% respiratory variability, suggesting right atrial pressure of 3 mmHg. IAS/Shunts: No atrial level shunt detected by color flow Doppler.  LEFT VENTRICLE PLAX 2D LVIDd:         5.20 cm      Diastology LVIDs:         3.50 cm      LV e' medial:    9.10 cm/s LV PW:         1.00 cm      LV E/e' medial:  10.0 LV IVS:        1.00 cm      LV e' lateral:   12.07 cm/s LVOT diam:     2.20 cm      LV E/e' lateral: 7.6 LV SV:         56 LV SV Index:   31 LVOT Area:     3.80 cm  LV Volumes (MOD) LV vol d, MOD A2C: 67.3 ml LV vol d, MOD A4C: 127.0 ml LV vol s, MOD A2C: 30.3 ml LV vol s, MOD A4C: 46.3 ml LV SV MOD A2C:     37.0 ml LV SV MOD A4C:     127.0 ml LV SV MOD BP:      54.1 ml RIGHT VENTRICLE RV Basal diam:  3.80 cm RV Mid diam:    3.00 cm RV S prime:     10.80 cm/s TAPSE (M-mode): 1.4 cm LEFT ATRIUM             Index        RIGHT ATRIUM           Index LA diam:        4.10 cm 2.24 cm/m   RA Area:     20.80 cm LA Vol (A2C):   62.0 ml 33.92 ml/m  RA Volume:   61.60 ml  33.70 ml/m LA Vol (A4C):   43.2 ml 23.64 ml/m LA Biplane Vol: 51.3 ml 28.07 ml/m  AORTIC VALVE                    PULMONIC VALVE AV Area (Vmax):    2.28 cm     PV Vmax:       1.03 m/s AV Area (Vmean):   2.41 cm     PV Peak grad:  4.2 mmHg AV Area (VTI):      2.57 cm AV Vmax:           145.33 cm/s AV Vmean:          94.900 cm/s AV VTI:            0.217 m AV Peak Grad:      8.4 mmHg AV Mean Grad:  4.3 mmHg LVOT Vmax:         87.27 cm/s LVOT Vmean:        60.100 cm/s LVOT VTI:          0.147 m LVOT/AV VTI ratio: 0.68  AORTA Ao Root diam: 3.60 cm Ao Asc diam:  3.80 cm MITRAL VALVE MV Area (PHT): 3.59 cm    SHUNTS MV Decel Time: 211 msec    Systemic VTI:  0.15 m MR Peak grad: 44.9 mmHg    Systemic Diam: 2.20 cm MR Vmax:      335.00 cm/s MV E velocity: 91.48 cm/s MV A velocity: 39.80 cm/s MV E/A ratio:  2.30 Rudean Haskell MD Electronically signed by Rudean Haskell MD Signature Date/Time: 07/13/2022/5:55:21 PM    Final    CT Head Wo Contrast  Result Date: 07/12/2022 CLINICAL DATA:  Mental status change, unknown cause; Neck trauma (Age >= 65y) fall EXAM: CT HEAD WITHOUT CONTRAST CT CERVICAL SPINE WITHOUT CONTRAST TECHNIQUE: Multidetector CT imaging of the head and cervical spine was performed following the standard protocol without intravenous contrast. Multiplanar CT image reconstructions of the cervical spine were also generated. RADIATION DOSE REDUCTION: This exam was performed according to the departmental dose-optimization program which includes automated exposure control, adjustment of the mA and/or kV according to patient size and/or use of iterative reconstruction technique. COMPARISON:  CT head and C-spine 03/10/2022 FINDINGS: CT HEAD FINDINGS BRAIN: BRAIN Cerebral ventricle sizes are concordant with the degree of cerebral volume loss. Left cerebellar encephalomalacia. No evidence of large-territorial acute infarction. No parenchymal hemorrhage. No mass lesion. No extra-axial collection. No mass effect or midline shift. No hydrocephalus. Basilar cisterns are patent. Vascular: No hyperdense vessel. Skull: No acute fracture or focal lesion. Chronic right temporal occipital region craniotomy. Sinuses/Orbits: Paranasal sinuses and mastoid air cells  are clear. The orbits are unremarkable. Other: None. CT CERVICAL SPINE FINDINGS Alignment: Stable grade grade 1/grade 2 anterolisthesis of C7 on T1. Mild retrolisthesis of C3 on C4 that is stable. Grade 1 anterolisthesis of C4 on C5 that is stable. Skull base and vertebrae: Similar-appearing multilevel severe degenerative changes of the spine with bulky osteophyte formation. Bulky posterior disc osteophyte complex formation at the C3-C4 level. Associated severe left osseous neural foraminal stenosis at C7-T1 level. Associated at least mild to moderate central osseous central canal stenosis at the C3 C4 level. No acute fracture. No aggressive appearing focal osseous lesion or focal pathologic process. Soft tissues and spinal canal: No prevertebral fluid or swelling. No visible canal hematoma. Upper chest: Unremarkable. Other: None. IMPRESSION: 1. No acute intracranial abnormality. 2. No acute displaced fracture or traumatic listhesis of the cervical spine. 3. 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 Electronically Signed   By: Iven Finn M.D.   On: 07/12/2022 17:29   CT Cervical Spine Wo Contrast  Result Date: 07/12/2022 CLINICAL DATA:  Mental status change, unknown cause; Neck trauma (Age >= 65y) fall EXAM: CT HEAD WITHOUT CONTRAST CT CERVICAL SPINE WITHOUT CONTRAST TECHNIQUE: Multidetector CT imaging of the head and cervical spine was performed following the standard protocol without intravenous contrast. Multiplanar CT image reconstructions of the cervical spine were also generated. RADIATION DOSE REDUCTION: This exam was performed according to the departmental dose-optimization program which includes automated exposure control, adjustment of the mA and/or kV according to patient size and/or use of iterative reconstruction technique. COMPARISON:  CT head and C-spine 03/10/2022 FINDINGS: CT HEAD  FINDINGS  BRAIN: BRAIN Cerebral ventricle sizes are concordant with the degree of cerebral volume loss. Left cerebellar encephalomalacia. No evidence of large-territorial acute infarction. No parenchymal hemorrhage. No mass lesion. No extra-axial collection. No mass effect or midline shift. No hydrocephalus. Basilar cisterns are patent. Vascular: No hyperdense vessel. Skull: No acute fracture or focal lesion. Chronic right temporal occipital region craniotomy. Sinuses/Orbits: Paranasal sinuses and mastoid air cells are clear. The orbits are unremarkable. Other: None. CT CERVICAL SPINE FINDINGS Alignment: Stable grade grade 1/grade 2 anterolisthesis of C7 on T1. Mild retrolisthesis of C3 on C4 that is stable. Grade 1 anterolisthesis of C4 on C5 that is stable. Skull base and vertebrae: Similar-appearing multilevel severe degenerative changes of the spine with bulky osteophyte formation. Bulky posterior disc osteophyte complex formation at the C3-C4 level. Associated severe left osseous neural foraminal stenosis at C7-T1 level. Associated at least mild to moderate central osseous central canal stenosis at the C3 C4 level. No acute fracture. No aggressive appearing focal osseous lesion or focal pathologic process. Soft tissues and spinal canal: No prevertebral fluid or swelling. No visible canal hematoma. Upper chest: Unremarkable. Other: None. IMPRESSION: 1. No acute intracranial abnormality. 2. No acute displaced fracture or traumatic listhesis of the cervical spine. 3. 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 Electronically Signed   By: Tish Frederickson M.D.   On: 07/12/2022 17:29   DG Chest Portable 1 View  Result Date: 07/12/2022 CLINICAL DATA:  Altered mental status.  Multiple recent falls. EXAM: PORTABLE CHEST 1 VIEW COMPARISON:  Radiographs 05/19/2021 and 05/11/2021.  CT 03/28/2021. FINDINGS: 1636  hours. Moderate patient rotation to the right. Allowing for the rotation, stable mild volume loss of the right lung base. No confluent airspace opacity, pleural effusion or pneumothorax. The heart size and mediastinal contours are stable with aortic atherosclerosis. No acute osseous findings are identified. Glenohumeral degenerative changes are present bilaterally with chronic posttraumatic deformity of the proximal left humerus. Multiple telemetry leads overlie the chest. IMPRESSION: No evidence of acute cardiopulmonary process. Electronically Signed   By: Carey Bullocks M.D.   On: 07/12/2022 16:51    Cardiac Studies   Echo 07/13/2022  1. Left ventricular ejection fraction, by estimation, is 55 to 60%. The  left ventricle has normal function. Left ventricular endocardial border  not optimally defined to evaluate regional wall motion. Left ventricular  diastolic parameters are  indeterminate.   2. Right ventricular systolic function is normal. The right ventricular  size is normal. Tricuspid regurgitation signal is inadequate for assessing  PA pressure.   3. Left atrial size was mildly dilated.   4. Right atrial size was mildly dilated.   5. The mitral valve is grossly normal. No evidence of mitral valve  regurgitation. No evidence of mitral stenosis.   6. The aortic valve was not well visualized. Aortic valve regurgitation  is not visualized.   7. The inferior vena cava is normal in size with greater than 50%  respiratory variability, suggesting right atrial pressure of 3 mmHg.   8. Technically difficult study.   Comparison(s): No prior Echocardiogram.   Patient Profile     71 y.o. male with PMH of PAF on Eliquis, HTN, PAD, Asthma, DM II, EtOH abuse, and temporal occipital region craniotomy/trigeminal nerve decompression presented with AMS and frequent falls. Telemetry shows atrial fibrillation with intermittent bradycardia. Metoprolol and digoxin held.   Assessment & Plan    Atrial  fibrillation with intermittent  bradycardia  - last episode of fall was 2 month ago, will continue eliquis. Metoprolol and digoxin held. HR improved overnight, HR mostly between 40s to 90s. No significant ventricular ectopy this morning. Also appeared was having AIVR which appears to have resolved overnight.   -continue to hold metoprolol and digoxin   AMS: improved. Ethanol level <10.  UDT positive for opiates. UA negative for bacteria. CXR normal. Mental status appears to have improved.   HTN: BP stable despite metoprolol being held  DM II  EtOH abuse      For questions or updates, please contact [facility] Please consult [email or web address] for contact info under        Signed, Azalee Course, PA  07/14/2022, 7:53 AM     Patient seen and examined.  Agree with above documentation.  On exam, patient is alert and oriented, irregular rhythm, normal rate, no murmurs, lungs CTAB, no LE edema or JVD.  Echocardiogram showed normal biventricular function, mild biatrial enlargement, no significant valvular disease.  Heart rate appears improved, did have some bradycardia overnight.  Will need outpatient sleep study.  Continue to hold metoprolol and digoxin.  Continue Eliquis for anticoagulation.  Little Ishikawa, MD

## 2022-07-14 NOTE — Progress Notes (Signed)
PT Cancellation Note  Patient Details Name: Taylor Myers MRN: [id number] DOB: March 01, 1951   Cancelled Treatment:    Reason Eval/Treat Not Completed: Fatigue/lethargy limiting ability to participate.  Did not respond to trying to wake him, will reattempt as time and pt allow.   Ivar Drape 07/14/2022, 3:21 PM  Samul Dada, PT PhD Acute Rehab Dept. Number: [facility] [id number] and [facility] [phone]

## 2022-07-14 NOTE — Progress Notes (Signed)
CSW acknowledges consult for substance use resources and to assess for home safety. Patient currently lethargic. CSW will follow back up when patient more alert.

## 2022-07-15 ENCOUNTER — Other Ambulatory Visit: Payer: Self-pay | Admitting: Physician Assistant

## 2022-07-15 DIAGNOSIS — I4891 Unspecified atrial fibrillation: Secondary | ICD-10-CM

## 2022-07-15 DIAGNOSIS — R001 Bradycardia, unspecified: Secondary | ICD-10-CM | POA: Diagnosis not present

## 2022-07-15 LAB — CBC WITH DIFFERENTIAL/PLATELET
Abs Immature Granulocytes: 0.02 10*3/uL (ref 0.00–0.07)
Basophils Absolute: 0 10*3/uL (ref 0.0–0.1)
Basophils Relative: 1 %
Eosinophils Absolute: 0.2 10*3/uL (ref 0.0–0.5)
Eosinophils Relative: 4 %
HCT: 32.1 % — ABNORMAL LOW (ref 39.0–52.0)
Hemoglobin: 10.9 g/dL — ABNORMAL LOW (ref 13.0–17.0)
Immature Granulocytes: 1 %
Lymphocytes Relative: 20 %
Lymphs Abs: 0.9 10*3/uL (ref 0.7–4.0)
MCH: 32.2 pg (ref 26.0–34.0)
MCHC: 34 g/dL (ref 30.0–36.0)
MCV: 94.7 fL (ref 80.0–100.0)
Monocytes Absolute: 0.9 10*3/uL (ref 0.1–1.0)
Monocytes Relative: 21 %
Neutro Abs: 2.3 10*3/uL (ref 1.7–7.7)
Neutrophils Relative %: 53 %
Platelets: 109 10*3/uL — ABNORMAL LOW (ref 150–400)
RBC: 3.39 MIL/uL — ABNORMAL LOW (ref 4.22–5.81)
RDW: 14.6 % (ref 11.5–15.5)
WBC: 4.3 10*3/uL (ref 4.0–10.5)
nRBC: 0 % (ref 0.0–0.2)

## 2022-07-15 LAB — BRAIN NATRIURETIC PEPTIDE: B Natriuretic Peptide: 339.5 pg/mL — ABNORMAL HIGH (ref 0.0–100.0)

## 2022-07-15 LAB — BASIC METABOLIC PANEL
Anion gap: 11 (ref 5–15)
BUN: 10 mg/dL (ref 8–23)
CO2: 25 mmol/L (ref 22–32)
Calcium: 8.4 mg/dL — ABNORMAL LOW (ref 8.9–10.3)
Chloride: 101 mmol/L (ref 98–111)
Creatinine, Ser: 0.93 mg/dL (ref 0.61–1.24)
GFR, Estimated: >60 mL/min (ref >60)
Glucose, Bld: 110 mg/dL — ABNORMAL HIGH (ref 70–99)
Potassium: 3.3 mmol/L — ABNORMAL LOW (ref 3.5–5.1)
Sodium: 137 mmol/L (ref 135–145)

## 2022-07-15 LAB — MAGNESIUM: Magnesium: 1.5 mg/dL — ABNORMAL LOW (ref 1.7–2.4)

## 2022-07-15 MED ORDER — POTASSIUM CHLORIDE CRYS ER 20 MEQ PO TBCR
40.0000 meq | EXTENDED_RELEASE_TABLET | Freq: Once | ORAL | Status: AC
  Administered 2022-07-15: 40 meq via ORAL
  Filled 2022-07-15: qty 2

## 2022-07-15 MED ORDER — HYDROCODONE-ACETAMINOPHEN 5-325 MG PO TABS: 1.0000 | ORAL_TABLET | ORAL | Status: DC | PRN

## 2022-07-15 MED ORDER — ELIQUIS 5 MG PO TABS: 5.0000 mg | ORAL_TABLET | Freq: Two times a day (BID) | ORAL | Status: DC

## 2022-07-15 NOTE — Progress Notes (Signed)
Ordered 14 day monitor for Afib, Dr. Bjorn Pippin to read.

## 2022-07-15 NOTE — Progress Notes (Signed)
Rounding Note    Patient Name: Taylor Myers Date of Encounter: 07/15/2022  [facility] Cardiologist: None   Subjective   Denies any CP or SOB  Inpatient Medications    Scheduled Meds:  allopurinol  300 mg Oral Daily   apixaban  5 mg Oral BID   atorvastatin  40 mg Oral Daily   erythromycin   Right Eye Q6H   folic acid  1 mg Oral Daily   LORazepam  1-4 mg Oral Q4H   Or   LORazepam  1-4 mg Intravenous Q4H   multivitamin with minerals  1 tablet Oral Daily   naphazoline-glycerin  2 drop Both Eyes Q6H   nystatin cream   Topical BID   tamsulosin  0.4 mg Oral Daily   thiamine  100 mg Oral Daily   Or   thiamine  100 mg Intravenous Daily   [START ON 07/19/2022] Vitamin D (Ergocalciferol)  50,000 Units Oral Q Mon   Continuous Infusions:  PRN Meds: acetaminophen, HYDROcodone-acetaminophen   Vital Signs    Vitals:   07/14/22 1339 07/14/22 1754 07/14/22 1948 07/15/22 0453  BP: 102/64 101/69 132/66 (!) 98/54  Pulse: 78 79 78 62  Resp: 19 18 20 20   Temp: 98.2 F (36.8 C)  98.4 F (36.9 C) 97.8 F (36.6 C)  TempSrc: Oral  Oral Oral  SpO2: 95% 99% 100% 100%  Weight:      Height:        Intake/Output Summary (Last 24 hours) at 07/15/2022 1025 Last data filed at 07/15/2022 0457 Gross per 24 hour  Intake --  Output 700 ml  Net -700 ml       07/12/2022    3:29 PM 03/19/2022    9:42 AM 09/21/2021    1:04 PM  Last 3 Weights  Weight (lbs) 149 lb 14.6 oz 150 lb 160 lb  Weight (kg) 68 kg 68.04 kg 72.576 kg      Telemetry    Atrial fibrillation with HR 40-70s. - Personally Reviewed  ECG    Atrial fibrillation with RBBB - Personally Reviewed  Physical Exam   GEN: No acute distress.   Neck: No JVD Cardiac: irregularly irregular, no murmurs, rubs, or gallops.  Respiratory: Clear to auscultation bilaterally. GI: Soft, nontender, non-distended  MS: No edema; No deformity. Neuro:  Nonfocal  Psych: Normal affect   Labs    High Sensitivity  Troponin:  No results for input(s): "TROPONINIHS" in the last 720 hours.   Chemistry Recent Labs  Lab 07/12/22 1534 07/12/22 2145 07/13/22 0610 07/14/22 0325 07/15/22 0121  NA 140   < > 139 139 137  K 2.9*   < > 3.7 3.6 3.3*  CL 103   < > 103 104 101  CO2 23   < > 24 26 25   GLUCOSE 105*   < > 80 119* 110*  BUN 21   < > 12 13 10   CREATININE 0.98   < > 0.79 0.88 0.93  CALCIUM 8.6*   < > 8.0* 8.2* 8.4*  MG  --    < > 1.9 1.6* 1.5*  PROT 6.3*  --  5.1*  --   --   ALBUMIN 3.4*  --  2.9*  --   --   AST 34  --  26  --   --   ALT 17  --  17  --   --   ALKPHOS 70  --  63  --   --  BILITOT 1.6*  --  1.6*  --   --   GFRNONAA >60   < > >60 >60 >60  ANIONGAP 14   < > 12 9 11    < > = values in this interval not displayed.     Lipids No results for input(s): "CHOL", "TRIG", "HDL", "LABVLDL", "LDLCALC", "CHOLHDL" in the last 168 hours.  Hematology Recent Labs  Lab 07/13/22 0400 07/14/22 0325 07/15/22 0121  WBC 5.1 4.2 4.3  RBC 3.24* 3.42* 3.39*  HGB 10.6* 10.9* 10.9*  HCT 31.0* 33.0* 32.1*  MCV 95.7 96.5 94.7  MCH 32.7 31.9 32.2  MCHC 34.2 33.0 34.0  RDW 14.4 14.5 14.6  PLT 81* 95* 109*    Thyroid  Recent Labs  Lab 07/13/22 0610  TSH 0.354     BNP Recent Labs  Lab 07/13/22 0400 07/14/22 0325 07/15/22 0121  BNP 263.3* 385.9* 339.5*     DDimer No results for input(s): "DDIMER" in the last 168 hours.   Radiology    ECHOCARDIOGRAM COMPLETE  Result Date: 07/13/2022    ECHOCARDIOGRAM REPORT   Patient Name:   JAMAURI KRUZEL Date of Exam: 07/13/2022 Medical Rec #:  07/15/2022   Height:       69.0 in Accession #:    [id number]  Weight:       149.9 lb Date of Birth:  11-30-1950  BSA:          1.828 m Patient Age:    71 years    BP:           114/63 mmHg Patient Gender: M           HR:           94 bpm. Exam Location:  Inpatient Procedure: 2D Echo Indications:    atrial fibrillation  History:        Patient has no prior history of Echocardiogram examinations.                  Arrythmias:Bradycardia and Atrial Fibrillation; Risk                 Factors:Diabetes and Hypertension.  Sonographer:    07/15/1951 Referring Phys: Cathie Hoops [facility]  Sonographer Comments: Technically difficult study due to poor echo windows. IMPRESSIONS  1. Left ventricular ejection fraction, by estimation, is 55 to 60%. The left ventricle has normal function. Left ventricular endocardial border not optimally defined to evaluate regional wall motion. Left ventricular diastolic parameters are indeterminate.  2. Right ventricular systolic function is normal. The right ventricular size is normal. Tricuspid regurgitation signal is inadequate for assessing PA pressure.  3. Left atrial size was mildly dilated.  4. Right atrial size was mildly dilated.  5. The mitral valve is grossly normal. No evidence of mitral valve regurgitation. No evidence of mitral stenosis.  6. The aortic valve was not well visualized. Aortic valve regurgitation is not visualized.  7. The inferior vena cava is normal in size with greater than 50% respiratory variability, suggesting right atrial pressure of 3 mmHg.  8. Technically difficult study. Comparison(s): No prior Echocardiogram. FINDINGS  Left Ventricle: Left ventricular ejection fraction, by estimation, is 55 to 60%. The left ventricle has normal function. Left ventricular endocardial border not optimally defined to evaluate regional wall motion. The left ventricular internal cavity size was normal in size. Suboptimal image quality limits for assessment of left ventricular hypertrophy. Left ventricular diastolic parameters are indeterminate. Right Ventricle: The right ventricular size is normal. No  increase in right ventricular wall thickness. Right ventricular systolic function is normal. Tricuspid regurgitation signal is inadequate for assessing PA pressure. Left Atrium: Left atrial size was mildly dilated. Right Atrium: Right atrial size was mildly dilated. Pericardium: There is  no evidence of pericardial effusion. Mitral Valve: The mitral valve is grossly normal. No evidence of mitral valve regurgitation. No evidence of mitral valve stenosis. Tricuspid Valve: The tricuspid valve is not well visualized. Tricuspid valve regurgitation is not demonstrated. Aortic Valve: The aortic valve was not well visualized. Aortic valve regurgitation is not visualized. Aortic valve mean gradient measures 4.3 mmHg. Aortic valve peak gradient measures 8.4 mmHg. Aortic valve area, by VTI measures 2.57 cm. Pulmonic Valve: The pulmonic valve was not well visualized. Pulmonic valve regurgitation is not visualized. Aorta: The aortic root and ascending aorta are structurally normal, with no evidence of dilitation. Venous: The inferior vena cava is normal in size with greater than 50% respiratory variability, suggesting right atrial pressure of 3 mmHg. IAS/Shunts: No atrial level shunt detected by color flow Doppler.  LEFT VENTRICLE PLAX 2D LVIDd:         5.20 cm      Diastology LVIDs:         3.50 cm      LV e' medial:    9.10 cm/s LV PW:         1.00 cm      LV E/e' medial:  10.0 LV IVS:        1.00 cm      LV e' lateral:   12.07 cm/s LVOT diam:     2.20 cm      LV E/e' lateral: 7.6 LV SV:         56 LV SV Index:   31 LVOT Area:     3.80 cm  LV Volumes (MOD) LV vol d, MOD A2C: 67.3 ml LV vol d, MOD A4C: 127.0 ml LV vol s, MOD A2C: 30.3 ml LV vol s, MOD A4C: 46.3 ml LV SV MOD A2C:     37.0 ml LV SV MOD A4C:     127.0 ml LV SV MOD BP:      54.1 ml RIGHT VENTRICLE RV Basal diam:  3.80 cm RV Mid diam:    3.00 cm RV S prime:     10.80 cm/s TAPSE (M-mode): 1.4 cm LEFT ATRIUM             Index        RIGHT ATRIUM           Index LA diam:        4.10 cm 2.24 cm/m   RA Area:     20.80 cm LA Vol (A2C):   62.0 ml 33.92 ml/m  RA Volume:   61.60 ml  33.70 ml/m LA Vol (A4C):   43.2 ml 23.64 ml/m LA Biplane Vol: 51.3 ml 28.07 ml/m  AORTIC VALVE                    PULMONIC VALVE AV Area (Vmax):    2.28 cm     PV Vmax:        1.03 m/s AV Area (Vmean):   2.41 cm     PV Peak grad:  4.2 mmHg AV Area (VTI):     2.57 cm AV Vmax:           145.33 cm/s AV Vmean:          94.900 cm/s AV VTI:  0.217 m AV Peak Grad:      8.4 mmHg AV Mean Grad:      4.3 mmHg LVOT Vmax:         87.27 cm/s LVOT Vmean:        60.100 cm/s LVOT VTI:          0.147 m LVOT/AV VTI ratio: 0.68  AORTA Ao Root diam: 3.60 cm Ao Asc diam:  3.80 cm MITRAL VALVE MV Area (PHT): 3.59 cm    SHUNTS MV Decel Time: 211 msec    Systemic VTI:  0.15 m MR Peak grad: 44.9 mmHg    Systemic Diam: 2.20 cm MR Vmax:      335.00 cm/s MV E velocity: 91.48 cm/s MV A velocity: 39.80 cm/s MV E/A ratio:  2.30 Riley Lam MD Electronically signed by Riley Lam MD Signature Date/Time: 07/13/2022/5:55:21 PM    Final     Cardiac Studies   Echo 07/13/2022  1. Left ventricular ejection fraction, by estimation, is 55 to 60%. The  left ventricle has normal function. Left ventricular endocardial border  not optimally defined to evaluate regional wall motion. Left ventricular  diastolic parameters are  indeterminate.   2. Right ventricular systolic function is normal. The right ventricular  size is normal. Tricuspid regurgitation signal is inadequate for assessing  PA pressure.   3. Left atrial size was mildly dilated.   4. Right atrial size was mildly dilated.   5. The mitral valve is grossly normal. No evidence of mitral valve  regurgitation. No evidence of mitral stenosis.   6. The aortic valve was not well visualized. Aortic valve regurgitation  is not visualized.   7. The inferior vena cava is normal in size with greater than 50%  respiratory variability, suggesting right atrial pressure of 3 mmHg.   8. Technically difficult study.   Comparison(s): No prior Echocardiogram.   Patient Profile     71 y.o. male with PMH of PAF on Eliquis, HTN, PAD, Asthma, DM II, EtOH abuse, and temporal occipital region craniotomy/trigeminal nerve decompression  presented with AMS and frequent falls. Telemetry shows atrial fibrillation with intermittent bradycardia. Metoprolol and digoxin held.   Assessment & Plan    Atrial fibrillation with intermittent bradycardia  - last episode of fall was 2 months ago, and he is essentially wheelchair bound, will continue eliquis. Metoprolol and digoxin held. HR improved, but does drop low at night (will need sleep study to rule out OSA).  -continue to hold metoprolol and digoxin.  Will plan Zio patch x 7 days on discharge   AMS: improved. Ethanol level <10.  UDT positive for opiates. UA negative for bacteria. CXR normal. Mental status appears to have improved.   HTN: BP stable despite metoprolol being held  DM II  EtOH abuse  [facility] will sign off.   Medication Recommendations:  Continue Eliquis.  Hold metoprolol and digoxin Other recommendations (labs, testing, etc):  Zio x 7 days Follow up as an outpatient:  Will schedule f/u in Afib clinic      For questions or updates, please contact [facility] Please consult [email or web address] for contact info under        Signed, Little Ishikawa, MD  07/15/2022, 10:25 AM

## 2022-07-15 NOTE — Discharge Summary (Addendum)
Physician Discharge Summary  Taylor Myers [id number] DOB: [birth year] DOA: 07/12/2022  PCP: Haywood Pao, MD  Admit date: 07/12/2022 Discharge date: 07/15/2022  Admitted From: home Disposition:  home  Recommendations for Outpatient Follow-up:  Follow up with PCP in 1-2 weeks  Home Health: none Equipment/Devices: none  Discharge Condition: stable CODE STATUS: Full code Diet Orders (From admission, onward)     Start     Ordered   07/13/22 1559  Diet Heart Room service appropriate? Yes; Fluid consistency: Thin  Diet effective now       Question Answer Comment  Room service appropriate? Yes   Fluid consistency: Thin      07/13/22 1558            HPI: Per admitting MD, Taylor Myers is a 71 y.o. male with medical history significant of Paroxysmal atrial fibrillation on Eliquis, hypertension, peripheral vascular disease, asthma, type 2 diabetes, hx of craniotomy, ongoing alcohol abuse with hx of withdrawal who presents with recurrent falls and AMS. Pt lethargic, awake to voice but not cooperative with answering questions. Contacted father who lives with him who was a limited historian. Reportedly pt has long standing hx of alcohol abuse with withdrawal in the past. Father found lots of empty liquor bottles in his room tonight. Not sure if pt takes his medications or other drugs.   Hospital Course / Discharge diagnoses: Principal Problem:   Bradycardia Active Problems:   Essential hypertension   Atrial fibrillation (HCC)   Hypokalemia   Hypomagnesemia   Hypocalcemia   Alcohol use   Bacterial conjunctivitis of right eye   Candidal dermatitis   Falls, initial encounter   Principal problem Generalized weakness, falls, intermittent confusion in the setting of dehydration, hypotension and bradycardia -he appears dehydrated on admission, received IV fluids with improvement.  He feels stronger, back to baseline.  Cardiology was consulted for his bradycardia and his  digoxin and metoprolol have been held.  Rates better.  TSH unremarkable at 0.354. Continue Eliquis, discussed with cardiology, will continue to hold metoprolol and digoxin upon discharge.   Active problems EtOH use-patient tells me he has not had anything to drink in the past 10 days. EtOH level was negative on admission and he did not exhibit withdrawal symptoms Concern for homicidal ideation-apparently he got into an argument with his dad and said he took out a gun.  Psychiatry consulted, evaluated patient, appreciate input.  He denies any harmful intent towards his father, states that it was a simple argument.  Per psychiatry, he does not meet criteria for IVC or psych hospitalization Paroxysmal atrial fibrillation with bradycardia-he has previous history of stroke with minimal residual left-sided weakness, as above, digoxin as well as metoprolol are on hold.  Appreciate cardiology follow-up History of severe right eye dryness with corneal abrasion and injury-Continue eyedrops, eye ointment, he is due for corneal reconstruction surgery with his ophthalmologist, postdischarge follow-up with him. History of stroke with minimal left-sided residual weakness-Supportive care.  Continue Eliquis and statin for secondary prevention.  PT OT. Recent right elbow injury requiring surgery with Dr. Glenna Durand has improving right elbow swelling, continue PT OT for range of motion follow-up with orthopedics postdischarge. Candidal dermatitis - Nystatin ointment twice daily to right intertriginous/groin region Hypocalcemia, hypokalemia, hypomagnesemia-replace and continue to monitor History of hypertension -Dehydrated on admission, status post IV fluids.  Blood pressure better, stable off metoprolol Generalized weakness and deconditioning-Wheelchair-bound.  PT OT.  Lives with elderly father, monitor closely. BPH-Flomax.  Sepsis ruled out  Discharge Instructions   Allergies as of 07/15/2022   No Known Allergies       Medication List     STOP taking these medications    Digox 0.125 MG tablet Generic drug: digoxin   HYDROcodone-acetaminophen 5-325 MG tablet Commonly known as: Norco   metoprolol succinate 25 MG 24 hr tablet Commonly known as: TOPROL-XL       TAKE these medications    Aleve 220 MG tablet Generic drug: naproxen sodium Take 220-440 mg by mouth 2 (two) times daily as needed (for pain).   allopurinol 300 MG tablet Commonly known as: ZYLOPRIM Take 300 mg by mouth daily.   amLODipine 5 MG tablet Commonly known as: NORVASC Take 5 mg by mouth daily.   atorvastatin 40 MG tablet Commonly known as: LIPITOR Take 40 mg by mouth daily.   Clear Eyes Pure Relief PF 0.25 % Soln Generic drug: Glycerin (PF) Place 1 drop into both eyes See admin instructions. Instill 1 drop into both eyes two to three times a day as needed for irritation   Eliquis 5 MG Tabs tablet Generic drug: apixaban Take 1 tablet (5 mg total) by mouth 2 (two) times daily. What changed: when to take this   erythromycin ophthalmic ointment Place 1 Application into the right eye 2 (two) times daily.   LIVER DETOX PO Take 2 tablets by mouth daily.   methocarbamol 500 MG tablet Commonly known as: ROBAXIN Take 1 tablet (500 mg total) by mouth 2 (two) times daily as needed. What changed: reasons to take this   NON FORMULARY Take 2 capsules by mouth See admin instructions. Beet extract capsules- Take 2 capsules by mouth once a day   ondansetron 4 MG tablet Commonly known as: Zofran Take 1 tablet (4 mg total) by mouth every 8 (eight) hours as needed for nausea or vomiting.   OVER THE COUNTER MEDICATION Take 1 tablet by mouth 2 (two) times daily. Beet extract   oxybutynin 5 MG tablet Commonly known as: DITROPAN Take 5 mg by mouth daily.   oxyCODONE-acetaminophen 5-325 MG tablet Commonly known as: Percocet Take 1-2 tablets by mouth every 6 (six) hours as needed for severe pain.   PRESCRIPTION  MEDICATION Place 1 drop into the right eye See admin instructions. Autologous serum eye drops- Instill 1 drop into the right eye 2 times a day   tamsulosin 0.4 MG Caps capsule Commonly known as: FLOMAX Take 0.4 mg by mouth daily.   TheraTears Nutrition Caps Take 1 capsule by mouth daily.   Tiger Balm Extra Strength 11-10 % Oint Apply 1 application  topically See admin instructions. Apply to affected hips, shoulders, joints, or other painful sites daily as needed for discomfort   TURMERIC PO Take 1 capsule by mouth daily.   Tylenol 8 Hour Arthritis Pain 650 MG CR tablet Generic drug: acetaminophen Take 650-1,300 mg by mouth every 8 (eight) hours as needed for pain.   venlafaxine XR 150 MG 24 hr capsule Commonly known as: EFFEXOR-XR Take 150 mg by mouth daily.   Vitamin D (Ergocalciferol) 1.25 MG (50000 UNIT) Caps capsule Commonly known as: DRISDOL Take 50,000 Units by mouth every Monday.        Follow-up Information     Tisovec, Fransico Him, MD. Call.   Specialty: Internal Medicine Why: Call for follow up appt within next two weeks. Contact information: [address] [place] [address] [phone]  Consultations: Cardiology   Procedures/Studies:  ECHOCARDIOGRAM COMPLETE  Result Date: 07/13/2022    ECHOCARDIOGRAM REPORT   Patient Name:   Taylor Myers Date of Exam: 07/13/2022 Medical Rec #:  [id number]   Height:       69.0 in Accession #:    [id number]  Weight:       149.9 lb Date of Birth:  October 19, 1950  BSA:          1.828 m Patient Age:    71 years    BP:           114/63 mmHg Patient Gender: M           HR:           94 bpm. Exam Location:  Inpatient Procedure: 2D Echo Indications:    atrial fibrillation  History:        Patient has no prior history of Echocardiogram examinations.                 Arrythmias:Bradycardia and Atrial Fibrillation; Risk                 Factors:Diabetes and Hypertension.  Sonographer:    Cathie Hoops Referring Phys:  [id number] [facility]  Sonographer Comments: Technically difficult study due to poor echo windows. IMPRESSIONS  1. Left ventricular ejection fraction, by estimation, is 55 to 60%. The left ventricle has normal function. Left ventricular endocardial border not optimally defined to evaluate regional wall motion. Left ventricular diastolic parameters are indeterminate.  2. Right ventricular systolic function is normal. The right ventricular size is normal. Tricuspid regurgitation signal is inadequate for assessing PA pressure.  3. Left atrial size was mildly dilated.  4. Right atrial size was mildly dilated.  5. The mitral valve is grossly normal. No evidence of mitral valve regurgitation. No evidence of mitral stenosis.  6. The aortic valve was not well visualized. Aortic valve regurgitation is not visualized.  7. The inferior vena cava is normal in size with greater than 50% respiratory variability, suggesting right atrial pressure of 3 mmHg.  8. Technically difficult study. Comparison(s): No prior Echocardiogram. FINDINGS  Left Ventricle: Left ventricular ejection fraction, by estimation, is 55 to 60%. The left ventricle has normal function. Left ventricular endocardial border not optimally defined to evaluate regional wall motion. The left ventricular internal cavity size was normal in size. Suboptimal image quality limits for assessment of left ventricular hypertrophy. Left ventricular diastolic parameters are indeterminate. Right Ventricle: The right ventricular size is normal. No increase in right ventricular wall thickness. Right ventricular systolic function is normal. Tricuspid regurgitation signal is inadequate for assessing PA pressure. Left Atrium: Left atrial size was mildly dilated. Right Atrium: Right atrial size was mildly dilated. Pericardium: There is no evidence of pericardial effusion. Mitral Valve: The mitral valve is grossly normal. No evidence of mitral valve regurgitation. No evidence of mitral  valve stenosis. Tricuspid Valve: The tricuspid valve is not well visualized. Tricuspid valve regurgitation is not demonstrated. Aortic Valve: The aortic valve was not well visualized. Aortic valve regurgitation is not visualized. Aortic valve mean gradient measures 4.3 mmHg. Aortic valve peak gradient measures 8.4 mmHg. Aortic valve area, by VTI measures 2.57 cm. Pulmonic Valve: The pulmonic valve was not well visualized. Pulmonic valve regurgitation is not visualized. Aorta: The aortic root and ascending aorta are structurally normal, with no evidence of dilitation. Venous: The inferior vena cava is normal in size with greater than 50% respiratory variability, suggesting right atrial pressure of 3  mmHg. IAS/Shunts: No atrial level shunt detected by color flow Doppler.  LEFT VENTRICLE PLAX 2D LVIDd:         5.20 cm      Diastology LVIDs:         3.50 cm      LV e' medial:    9.10 cm/s LV PW:         1.00 cm      LV E/e' medial:  10.0 LV IVS:        1.00 cm      LV e' lateral:   12.07 cm/s LVOT diam:     2.20 cm      LV E/e' lateral: 7.6 LV SV:         56 LV SV Index:   31 LVOT Area:     3.80 cm  LV Volumes (MOD) LV vol d, MOD A2C: 67.3 ml LV vol d, MOD A4C: 127.0 ml LV vol s, MOD A2C: 30.3 ml LV vol s, MOD A4C: 46.3 ml LV SV MOD A2C:     37.0 ml LV SV MOD A4C:     127.0 ml LV SV MOD BP:      54.1 ml RIGHT VENTRICLE RV Basal diam:  3.80 cm RV Mid diam:    3.00 cm RV S prime:     10.80 cm/s TAPSE (M-mode): 1.4 cm LEFT ATRIUM             Index        RIGHT ATRIUM           Index LA diam:        4.10 cm 2.24 cm/m   RA Area:     20.80 cm LA Vol (A2C):   62.0 ml 33.92 ml/m  RA Volume:   61.60 ml  33.70 ml/m LA Vol (A4C):   43.2 ml 23.64 ml/m LA Biplane Vol: 51.3 ml 28.07 ml/m  AORTIC VALVE                    PULMONIC VALVE AV Area (Vmax):    2.28 cm     PV Vmax:       1.03 m/s AV Area (Vmean):   2.41 cm     PV Peak grad:  4.2 mmHg AV Area (VTI):     2.57 cm AV Vmax:           145.33 cm/s AV Vmean:           94.900 cm/s AV VTI:            0.217 m AV Peak Grad:      8.4 mmHg AV Mean Grad:      4.3 mmHg LVOT Vmax:         87.27 cm/s LVOT Vmean:        60.100 cm/s LVOT VTI:          0.147 m LVOT/AV VTI ratio: 0.68  AORTA Ao Root diam: 3.60 cm Ao Asc diam:  3.80 cm MITRAL VALVE MV Area (PHT): 3.59 cm    SHUNTS MV Decel Time: 211 msec    Systemic VTI:  0.15 m MR Peak grad: 44.9 mmHg    Systemic Diam: 2.20 cm MR Vmax:      335.00 cm/s MV E velocity: 91.48 cm/s MV A velocity: 39.80 cm/s MV E/A ratio:  2.30 Rudean Haskell MD Electronically signed by Rudean Haskell MD Signature Date/Time: 07/13/2022/5:55:21 PM    Final    CT Head Wo  Contrast  Result Date: 07/12/2022 CLINICAL DATA:  Mental status change, unknown cause; Neck trauma (Age >= 65y) fall EXAM: CT HEAD WITHOUT CONTRAST CT CERVICAL SPINE WITHOUT CONTRAST TECHNIQUE: Multidetector CT imaging of the head and cervical spine was performed following the standard protocol without intravenous contrast. Multiplanar CT image reconstructions of the cervical spine were also generated. RADIATION DOSE REDUCTION: This exam was performed according to the departmental dose-optimization program which includes automated exposure control, adjustment of the mA and/or kV according to patient size and/or use of iterative reconstruction technique. COMPARISON:  CT head and C-spine 03/10/2022 FINDINGS: CT HEAD FINDINGS BRAIN: BRAIN Cerebral ventricle sizes are concordant with the degree of cerebral volume loss. Left cerebellar encephalomalacia. No evidence of large-territorial acute infarction. No parenchymal hemorrhage. No mass lesion. No extra-axial collection. No mass effect or midline shift. No hydrocephalus. Basilar cisterns are patent. Vascular: No hyperdense vessel. Skull: No acute fracture or focal lesion. Chronic right temporal occipital region craniotomy. Sinuses/Orbits: Paranasal sinuses and mastoid air cells are clear. The orbits are unremarkable. Other: None. CT  CERVICAL SPINE FINDINGS Alignment: Stable grade grade 1/grade 2 anterolisthesis of C7 on T1. Mild retrolisthesis of C3 on C4 that is stable. Grade 1 anterolisthesis of C4 on C5 that is stable. Skull base and vertebrae: Similar-appearing multilevel severe degenerative changes of the spine with bulky osteophyte formation. Bulky posterior disc osteophyte complex formation at the C3-C4 level. Associated severe left osseous neural foraminal stenosis at C7-T1 level. Associated at least mild to moderate central osseous central canal stenosis at the C3 C4 level. No acute fracture. No aggressive appearing focal osseous lesion or focal pathologic process. Soft tissues and spinal canal: No prevertebral fluid or swelling. No visible canal hematoma. Upper chest: Unremarkable. Other: None. IMPRESSION: 1. No acute intracranial abnormality. 2. No acute displaced fracture or traumatic listhesis of the cervical spine. 3. 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 Electronically Signed   By: Iven Finn M.D.   On: 07/12/2022 17:29   CT Cervical Spine Wo Contrast  Result Date: 07/12/2022 CLINICAL DATA:  Mental status change, unknown cause; Neck trauma (Age >= 65y) fall EXAM: CT HEAD WITHOUT CONTRAST CT CERVICAL SPINE WITHOUT CONTRAST TECHNIQUE: Multidetector CT imaging of the head and cervical spine was performed following the standard protocol without intravenous contrast. Multiplanar CT image reconstructions of the cervical spine were also generated. RADIATION DOSE REDUCTION: This exam was performed according to the departmental dose-optimization program which includes automated exposure control, adjustment of the mA and/or kV according to patient size and/or use of iterative reconstruction technique. COMPARISON:  CT head and C-spine 03/10/2022 FINDINGS: CT HEAD FINDINGS BRAIN: BRAIN Cerebral ventricle sizes are  concordant with the degree of cerebral volume loss. Left cerebellar encephalomalacia. No evidence of large-territorial acute infarction. No parenchymal hemorrhage. No mass lesion. No extra-axial collection. No mass effect or midline shift. No hydrocephalus. Basilar cisterns are patent. Vascular: No hyperdense vessel. Skull: No acute fracture or focal lesion. Chronic right temporal occipital region craniotomy. Sinuses/Orbits: Paranasal sinuses and mastoid air cells are clear. The orbits are unremarkable. Other: None. CT CERVICAL SPINE FINDINGS Alignment: Stable grade grade 1/grade 2 anterolisthesis of C7 on T1. Mild retrolisthesis of C3 on C4 that is stable. Grade 1 anterolisthesis of C4 on C5 that is stable. Skull base and vertebrae: Similar-appearing multilevel severe degenerative changes of the spine with bulky osteophyte formation. Bulky posterior disc osteophyte complex formation at the C3-C4 level. Associated severe  left osseous neural foraminal stenosis at C7-T1 level. Associated at least mild to moderate central osseous central canal stenosis at the C3 C4 level. No acute fracture. No aggressive appearing focal osseous lesion or focal pathologic process. Soft tissues and spinal canal: No prevertebral fluid or swelling. No visible canal hematoma. Upper chest: Unremarkable. Other: None. IMPRESSION: 1. No acute intracranial abnormality. 2. No acute displaced fracture or traumatic listhesis of the cervical spine. 3. 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 Electronically Signed   By: Iven Finn M.D.   On: 07/12/2022 17:29   DG Chest Portable 1 View  Result Date: 07/12/2022 CLINICAL DATA:  Altered mental status.  Multiple recent falls. EXAM: PORTABLE CHEST 1 VIEW COMPARISON:  Radiographs 05/19/2021 and 05/11/2021.  CT 03/28/2021. FINDINGS: 1636 hours. Moderate patient rotation to the right. Allowing  for the rotation, stable mild volume loss of the right lung base. No confluent airspace opacity, pleural effusion or pneumothorax. The heart size and mediastinal contours are stable with aortic atherosclerosis. No acute osseous findings are identified. Glenohumeral degenerative changes are present bilaterally with chronic posttraumatic deformity of the proximal left humerus. Multiple telemetry leads overlie the chest. IMPRESSION: No evidence of acute cardiopulmonary process. Electronically Signed   By: Richardean Sale M.D.   On: 07/12/2022 16:51     Subjective: - no chest pain, shortness of breath, no abdominal pain, nausea or vomiting.   Discharge Exam: BP (!) 98/54 (BP Location: Left Arm)   Pulse 62   Temp 97.8 F (36.6 C) (Oral)   Resp 20   Ht 5\' 9"  (1.753 m)   Wt 68 kg   SpO2 100%   BMI 22.14 kg/m   General: Pt is alert, awake, not in acute distress Cardiovascular: RRR, S1/S2 +, no rubs, no gallops Respiratory: CTA bilaterally, no wheezing, no rhonchi Abdominal: Soft, NT, ND, bowel sounds + Extremities: no edema, no cyanosis   The results of significant diagnostics from this hospitalization (including imaging, microbiology, ancillary and laboratory) are listed below for reference.     Microbiology: Recent Results (from the past 240 hour(s))  Blood culture (routine x 2)     Status: None (Preliminary result)   Collection Time: 07/12/22  3:37 PM   Specimen: BLOOD LEFT FOREARM  Result Value Ref Range Status   Specimen Description BLOOD LEFT FOREARM  Final   Special Requests   Final    BOTTLES DRAWN AEROBIC AND ANAEROBIC Blood Culture adequate volume   Culture   Final    NO GROWTH 2 DAYS Performed at [facility], [address] [place], [place] [address]    Report Status PENDING  Incomplete  Blood culture (routine x 2)     Status: None (Preliminary result)   Collection Time: 07/12/22  3:43 PM   Specimen: BLOOD LEFT FOREARM  Result Value Ref Range Status   Specimen  Description BLOOD LEFT FOREARM  Final   Special Requests   Final    BOTTLES DRAWN AEROBIC AND ANAEROBIC Blood Culture results may not be optimal due to an inadequate volume of blood received in culture bottles   Culture   Final    NO GROWTH 2 DAYS Performed at [facility], [place] [address], [place], [place] [address]    Report Status PENDING  Incomplete     Labs: Basic Metabolic Panel: Recent Labs  Lab 07/12/22 1534 07/12/22 2145 07/13/22 0109 07/13/22 0610 07/14/22 0325 07/15/22  0121  NA 140  --  140 139 139 137  K 2.9*  --  3.7 3.7 3.6 3.3*  CL 103  --  104 103 104 101  CO2 23  --  23 24 26 25   GLUCOSE 105*  --  85 80 119* 110*  BUN 21  --  13 12 13 10   CREATININE 0.98  --  0.77 0.79 0.88 0.93  CALCIUM 8.6*  --  7.8* 8.0* 8.2* 8.4*  MG  --  1.5*  --  1.9 1.6* 1.5*   Liver Function Tests: Recent Labs  Lab 07/12/22 1534 07/13/22 0610  AST 34 26  ALT 17 17  ALKPHOS 70 63  BILITOT 1.6* 1.6*  PROT 6.3* 5.1*  ALBUMIN 3.4* 2.9*   CBC: Recent Labs  Lab 07/12/22 1534 07/13/22 0400 07/14/22 0325 07/15/22 0121  WBC 6.9 5.1 4.2 4.3  NEUTROABS 5.1  --  2.7 2.3  HGB 11.2* 10.6* 10.9* 10.9*  HCT 33.7* 31.0* 33.0* 32.1*  MCV 94.4 95.7 96.5 94.7  PLT 83* 81* 95* 109*   CBG: No results for input(s): "GLUCAP" in the last 168 hours. Hgb A1c No results for input(s): "HGBA1C" in the last 72 hours. Lipid Profile No results for input(s): "CHOL", "HDL", "LDLCALC", "TRIG", "CHOLHDL", "LDLDIRECT" in the last 72 hours. Thyroid function studies Recent Labs    07/13/22 0610  TSH 0.354   Urinalysis    Component Value Date/Time   COLORURINE AMBER (A) 07/12/2022 2124   APPEARANCEUR CLEAR 07/12/2022 2124   LABSPEC 1.021 07/12/2022 2124   PHURINE 5.0 07/12/2022 2124   GLUCOSEU NEGATIVE 07/12/2022 2124   HGBUR NEGATIVE 07/12/2022 2124   BILIRUBINUR NEGATIVE 07/12/2022 2124   KETONESUR 20 (A) 07/12/2022 2124   PROTEINUR 30 (A) 07/12/2022 2124   NITRITE NEGATIVE  07/12/2022 2124   LEUKOCYTESUR NEGATIVE 07/12/2022 2124    FURTHER DISCHARGE INSTRUCTIONS:   Get Medicines reviewed and adjusted: Please take all your medications with you for your next visit with your Primary MD   Laboratory/radiological data: Please request your Primary MD to go over all hospital tests and procedure/radiological results at the follow up, please ask your Primary MD to get all Hospital records sent to his/her office.   In some cases, they will be blood work, cultures and biopsy results pending at the time of your discharge. Please request that your primary care M.D. goes through all the records of your hospital data and follows up on these results.   Also Note the following: If you experience worsening of your admission symptoms, develop shortness of breath, life threatening emergency, suicidal or homicidal thoughts you must seek medical attention immediately by calling 911 or calling your MD immediately  if symptoms less severe.   You must read complete instructions/literature along with all the possible adverse reactions/side effects for all the Medicines you take and that have been prescribed to you. Take any new Medicines after you have completely understood and accpet all the possible adverse reactions/side effects.    Do not drive when taking Pain medications or sleeping medications (Benzodaizepines)   Do not take more than prescribed Pain, Sleep and Anxiety Medications. It is not advisable to combine anxiety,sleep and pain medications without talking with your primary care practitioner   Special Instructions: If you have smoked or chewed Tobacco  in the last 2 yrs please stop smoking, stop any regular Alcohol  and or any Recreational drug use.   Wear Seat belts while driving.   Please note: You  were cared for by a hospitalist during your hospital stay. Once you are discharged, your primary care physician will handle any further medical issues. Please note that NO  REFILLS for any discharge medications will be authorized once you are discharged, as it is imperative that you return to your primary care physician (or establish a relationship with a primary care physician if you do not have one) for your post hospital discharge needs so that they can reassess your need for medications and monitor your lab values.  Time coordinating discharge: 40 minutes  SIGNED:  Marzetta Board, MD, PhD 07/15/2022, 11:09 AM

## 2022-07-15 NOTE — TOC Transition Note (Signed)
Transition of Care [facility]) - CM/SW Discharge Note   Patient Details  Name: Taylor Myers MRN: [id number] Date of Birth: 12-31-1950  Transition of Care [facility]) CM/SW Contact:  Glennon Mac, RN Phone Number: 07/15/2022, 10:56 AM   Clinical Narrative:    Pt is a 71 y/o malewho presented with AMS, falls and weakness in setting of dehydration, hypotension and bradycardia.  PTA, pt fairly independent, but has gradually relied on WC within the last month.  He lives at home with his elderly father, and has assistance from friends as needed. PT/OT recommending HH follow up, and patient active with [facility] for HHPT/OT.  Resumption orders for [facility] services obtained from MD; notified [facility].   Patient needs transportation home today; his WC is at home.  He states he usually relies on [facility] for transportation home from hospital.  Will arrange [facility] transport.   [facility] notified for transport at 11:09am.     Final next level of care: Home w Home Health Services Barriers to Discharge: Barriers Resolved   Patient Goals and CMS Choice   CMS Medicare.gov Compare Post Acute Care list provided to:: Patient Choice offered to / list presented to : Patient                      Discharge Plan and Services   Discharge Planning Services: CM Consult Post Acute Care Choice: Home Health                               Social Determinants of Health (SDOH) Interventions     Readmission Risk Interventions     No data to display         Quintella Baton, RN, BSN  Trauma/Neuro ICU Case Manager [phone]

## 2022-07-17 LAB — CULTURE, BLOOD (ROUTINE X 2)
Culture: NO GROWTH
Culture: NO GROWTH
Special Requests: ADEQUATE

## 2022-07-19 ENCOUNTER — Ambulatory Visit: Payer: 59 | Attending: Physician Assistant

## 2022-07-19 DIAGNOSIS — I4891 Unspecified atrial fibrillation: Secondary | ICD-10-CM

## 2022-07-19 NOTE — Progress Notes (Signed)
  Care Coordination Note  07/19/2022 Name: Taylor Myers MRN: [id number] DOB: [birth year]  Taylor Myers is a 71 y.o. year old male who is a primary care patient of Tisovec, Adelfa Koh, MD and is actively engaged with the care management team. I reached out to [name] by phone today to assist with re-scheduling an initial visit with the RN Case Manager  Follow up plan: Telephone appointment with care management team member scheduled for: 07/30/22  [facility]  Care Coordination Care Guide  Direct Dial: [phone]

## 2022-07-19 NOTE — Progress Notes (Unsigned)
Enrolled for [employer] to mail a ZIO XT long term holter monitor to the patients address on file.  ? ?Dr. Schumann to read. ?

## 2022-07-23 ENCOUNTER — Telehealth: Payer: Self-pay | Admitting: Orthopaedic Surgery

## 2022-07-23 NOTE — Telephone Encounter (Signed)
WBAT, ROM as tolerated

## 2022-07-23 NOTE — Telephone Encounter (Signed)
Duncan Dull From [facility] called in requesting information... Dorinda Hill was wondering what exercises is the pt suppose to be doing... Donald requesting callback at [phone]

## 2022-07-24 DIAGNOSIS — I4891 Unspecified atrial fibrillation: Secondary | ICD-10-CM | POA: Diagnosis not present

## 2022-07-30 ENCOUNTER — Ambulatory Visit: Payer: Self-pay

## 2022-07-30 NOTE — Patient Instructions (Signed)
Visit Information  Thank you for taking time to visit with me today. Please don't hesitate to contact me if I can be of assistance to you.   Following are the goals we discussed today:   Goals Addressed               This Visit's Progress     Patient Stated     I have to have eye surgery (pt-stated)        Care Coordination Interventions: Evaluation of current treatment plan related to cataract extraction and patient's adherence to plan as established by provider Determined patient will undergo Cataract surgery towards the end of December Discussed patient's Ophthalmologist is Macky Lower, PA-C at [facility]         I want to continue to work with PT and OT to help me get stronger (pt-stated)        Care Coordination Interventions: Evaluation of current treatment plan related to impaired physical mobility and patient's adherence to plan as established by provider Determined patient is using a W/C for mobility, he is not ambulatory but can stand to perform exercises Discussed patient is working with in home PT and OT via [facility] Assessed for working emergency alert system, patient verbalizes he has a cell phone and an emergency alert system that is in good working condition  Educated patient regarding risk for bleeding while taking Eliquis secondary to falling and to seek medical attention if a fall occurs           Our next appointment is by telephone on 09/13/22 at 1100 AM  Please call the care guide team at [phone] if you need to cancel or reschedule your appointment.   If you are experiencing a Mental Health or Behavioral Health Crisis or need someone to talk to, please call 1-800-273-TALK (toll free, 24 hour hotline)  Patient verbalizes understanding of instructions and care plan provided today and agrees to view in MyChart. Active MyChart status and patient understanding of how to access instructions and care plan via  MyChart confirmed with patient.     Delsa Sale, RN, BSN, CCM Care Management Coordinator [facility] Care Management Direct Phone: [phone]

## 2022-07-30 NOTE — Patient Outreach (Signed)
  Care Coordination   Initial Visit Note   07/30/2022 Name: Taylor Myers MRN: [id number] DOB: [birth year]  Taylor Myers is a 71 y.o. year old male who sees Tisovec, Adelfa Koh, MD for primary care. I spoke with  [name] by phone today.  What matters to the patients health and wellness today?  Patient will focus on working with PT/OT to help with strengthening and endurance. Patient will undergo Cataract surgery toward the end of December.       Goals Addressed               This Visit's Progress     Patient Stated     I have to have eye surgery (pt-stated)        Care Coordination Interventions: Evaluation of current treatment plan related to cataract extraction and patient's adherence to plan as established by provider Determined patient will undergo Cataract surgery towards the end of December Discussed patient's Ophthalmologist is Macky Lower, PA-C at [facility]         I want to continue to work with PT and OT to help me get stronger (pt-stated)        Care Coordination Interventions: Evaluation of current treatment plan related to impaired physical mobility and patient's adherence to plan as established by provider Determined patient is using a W/C for mobility, he is not ambulatory but can stand to perform exercises Discussed patient is working with in home PT and OT via [facility] Assessed for working emergency alert system, patient verbalizes he has a cell phone and an emergency alert system that is in good working condition  Educated patient regarding risk for bleeding while taking Eliquis secondary to falling and to seek medical attention if a fall occurs           SDOH assessments and interventions completed:  No  SDOH Interventions Today    Flowsheet Row Most Recent Value  SDOH Interventions   Transportation Interventions Intervention Not Indicated        Care Coordination Interventions:  Yes, provided    Follow up plan: Follow up call scheduled for 09/13/22 @1100  AM    Encounter Outcome:  Pt. Visit Completed

## 2022-08-03 ENCOUNTER — Ambulatory Visit (HOSPITAL_COMMUNITY): Payer: 59 | Admitting: Physician Assistant

## 2022-08-04 ENCOUNTER — Encounter (HOSPITAL_COMMUNITY): Payer: Self-pay

## 2022-08-04 ENCOUNTER — Ambulatory Visit (HOSPITAL_COMMUNITY): Payer: Medicare Other | Admitting: Physician Assistant

## 2022-09-09 ENCOUNTER — Ambulatory Visit: Payer: 59 | Admitting: Orthopaedic Surgery

## 2022-09-13 ENCOUNTER — Ambulatory Visit: Payer: Self-pay

## 2022-09-13 NOTE — Patient Instructions (Signed)
Visit Information  Thank you for taking time to visit with me today. Please don't hesitate to contact me if I can be of assistance to you.   Following are the goals we discussed today:   Goals Addressed               This Visit's Progress     Patient Stated     I have to have eye surgery (pt-stated)        Care Coordination Interventions: Evaluation of current treatment plan related to cataract extraction and patient's adherence to plan as established by provider Determined patient missed his Cataract surgery scheduled for December with Ophthalmologist is Marta Lamas, PA-C at [facility]  Discussed patient's 88 year old father had a fall and has been hospitalized, patient has been caring for/monitoring his father's condition Determined patient plans to call his Ophthalmologist this week to reschedule his surgery         I want to continue to work with PT and OT to help me get stronger (pt-stated)        Care Coordination Interventions: Evaluation of current treatment plan related to impaired physical mobility and patient's adherence to plan as established by provider Determined patient has been discharged from OT, he continues to work with PT, however, due to caring for his sick father, he has missed visits Discussed patient plans to contact his PT this week to reschedule his missed visits       To stay hydrated and rested (pt-stated)        Care Coordination Interventions: Evaluation of current treatment plan related to physical exhaustion and patient's adherence to plan as established by provider Determined patient has been overseeing his 26 year old fathers care following a recent fall and hospitalization Determined patient has become physically exhausted, and dehydrated, resulting in the need to call EMS to his home yesterday Determined patient was given IV fluids and advised to balance his activity with plenty of rest Discussed today patient  feels well and his BP is stable, he plans to take better care of himself now that his father is in rehab and being well cared for  Instructed patient to continue to stay rested and hydrated as directed and to contact his PCP for new or worsening symptoms            Our next appointment is by telephone on 08/11/23 at 12:30 PM   Please call the care guide team at [phone] if you need to cancel or reschedule your appointment.   If you are experiencing a Mental Health or [place] or need someone to talk to, please go to [facility] [address] [place] [phone])  Patient verbalizes understanding of instructions and care plan provided today and agrees to view in [place]. Active MyChart status and patient understanding of how to access instructions and care plan via MyChart confirmed with patient.     Barb Merino, RN, BSN, CCM Care Management Coordinator [facility] Care Management Direct Phone: [phone]

## 2022-09-13 NOTE — Patient Outreach (Signed)
  Care Coordination   09/13/2022 Name: Taylor Myers MRN: [id number] DOB: Jan 08, 1951   Care Coordination Outreach Attempts:  An unsuccessful telephone outreach was attempted for a scheduled appointment today.  Follow Up Plan:  Additional outreach attempts will be made to offer the patient care coordination information and services.   Encounter Outcome:  No Answer   Care Coordination Interventions:  No, not indicated    Barb Merino, RN, BSN, CCM Care Management Coordinator [facility] Care Management  Direct Phone: [phone]

## 2022-09-13 NOTE — Patient Outreach (Signed)
  Care Coordination   Follow Up Visit Note   09/13/2022 Name: Taylor Myers MRN: [id number] DOB: July 07, 1951  Taylor Myers is a 72 y.o. year old male who sees Tisovec, Fransico Him, MD for primary care. I spoke with  Vivi Ferns by phone today.  What matters to the patients health and wellness today?  Patient has physical exhaustion and required hydration via EMS due to over doing it while taking care of his elderly father who was hospitalized.     Goals Addressed               This Visit's Progress     Patient Stated     I have to have eye surgery (pt-stated)        Care Coordination Interventions: Evaluation of current treatment plan related to cataract extraction and patient's adherence to plan as established by provider Determined patient missed his Cataract surgery scheduled for December with Ophthalmologist is Marta Lamas, PA-C at [facility]  Discussed patient's 31 year old father had a fall and has been hospitalized, patient has been caring for/monitoring his father's condition Determined patient plans to call his Ophthalmologist this week to reschedule his surgery         I want to continue to work with PT and OT to help me get stronger (pt-stated)        Care Coordination Interventions: Evaluation of current treatment plan related to impaired physical mobility and patient's adherence to plan as established by provider Determined patient has been discharged from OT, he continues to work with PT, however, due to caring for his sick father, he has missed visits Discussed patient plans to contact his PT this week to reschedule his missed visits       To stay hydrated and rested (pt-stated)        Care Coordination Interventions: Evaluation of current treatment plan related to physical exhaustion and patient's adherence to plan as established by provider Determined patient has been overseeing his 41 year old fathers care following a recent fall  and hospitalization Determined patient has become physically exhausted, and dehydrated, resulting in the need to call EMS to his home yesterday Determined patient was given IV fluids and advised to balance his activity with plenty of rest Discussed today patient feels well and his BP is stable, he plans to take better care of himself now that his father is in rehab and being well cared for  Instructed patient to continue to stay rested and hydrated as directed and to contact his PCP for new or worsening symptoms            SDOH assessments and interventions completed:  No     Care Coordination Interventions:  Yes, provided   Follow up plan: Follow up call scheduled for 10/11/22 @12 :30 PM    Encounter Outcome:  Pt. Visit Completed

## 2022-09-14 ENCOUNTER — Encounter: Payer: Self-pay | Admitting: *Deleted

## 2022-09-21 ENCOUNTER — Telehealth: Payer: Self-pay | Admitting: Cardiology

## 2022-09-21 NOTE — Telephone Encounter (Signed)
Sharpe, Hayley A, RN  P Cv Div Nl Scheduling; Silverio Lay, RN Patient needs follow up post hospitalization.    Please schedule with APP or Dr. Gardiner Rhyme  Thanks!   Called 3x to schedule appt, unable to LVM - will send MyChart message

## 2022-10-03 NOTE — Progress Notes (Unsigned)
Cardiology Office Note:    Date:  10/04/2022   ID:  Taylor Myers, DOB April 21, 1951, MRN [id number]  PCP:  Haywood Pao, MD  Cardiologist:  Donato Heinz, MD  Electrophysiologist:  None   Referring MD: Haywood Pao, MD   Chief Complaint  Patient presents with   Atrial Fibrillation    History of Present Illness:    Taylor Myers is a 72 y.o. male with a hx of atrial fibrillation, PAD, CVA, T2DM, hypertension, alcohol abuse who presents for follow-up.  He was admitted 06/2022 with altered mental status and falls.  Cardiology was consulted for atrial fibrillation.  He was noted on telemetry to be in A-fib with rates down to 30s to 40s and pauses up to 3 seconds.  Home metoprolol and digoxin were held with improvement in heart rate.  He was continued on Eliquis for anticoagulation.  Echocardiogram 07/13/2022 showed EF 55 to 60%, normal RV function, mild biatrial enlargement, no significant valvular disease.  Zio patch x 14 days on 08/17/2022 showed 100% A-fib burden, average rate 82 bpm, occasional PVCs (2.6% of beats), 17 episodes of NSVT with longest lasting 17 beats.  Since discharge from hospital, he reports he has been doing okay.  Had some lightheadedness recently, called EMS and received IV fluids.  No issues since.  Denies any chest pain, dyspnea, lower extremity edema, or palpitations.  Reports BP has been well-controlled.  Denies any bleeding on Eliquis.  He is pretty much wheelchair-bound.  He did have a fall recently where he slid to the ground while trying to get into his bed.   Past Medical History:  Diagnosis Date   A-fib ([place])    Alcohol abuse, in remission    Arthritis    Asthma    child hood asthma   History of kidney stones    "dormant kidney stone"   Hypertension    Wheelchair dependent     Past Surgical History:  Procedure Laterality Date   ANKLE SURGERY Right    APPLICATION OF WOUND VAC Right 03/19/2022   Procedure: APPLICATION OF WOUND VAC;   Surgeon: Leandrew Koyanagi, MD;  Location: [facility];  Service: Orthopedics;  Laterality: Right;   HIP SURGERY Right    total replacement   KNEE SURGERY     bilateral replacements   ORIF HUMERUS FRACTURE Right 03/19/2022   Procedure: OPEN REDUCTION INTERNAL FIXATION (ORIF) RIGHT DISTAL HUMERUS FRACTURE;  Surgeon: Leandrew Koyanagi, MD;  Location: [place];  Service: Orthopedics;  Laterality: Right;    Current Medications: Current Meds  Medication Sig   ALEVE 220 MG tablet Take 220-440 mg by mouth 2 (two) times daily as needed (for pain).   allopurinol (ZYLOPRIM) 300 MG tablet Take 300 mg by mouth daily.   amLODipine (NORVASC) 5 MG tablet Take 5 mg by mouth daily.   atorvastatin (LIPITOR) 40 MG tablet Take 40 mg by mouth daily.   CLEAR EYES PURE RELIEF PF 0.25 % SOLN Place 1 drop into both eyes See admin instructions. Instill 1 drop into both eyes two to three times a day as needed for irritation   DHA-EPA-Flaxseed Oil-Vitamin E (THERATEARS NUTRITION) CAPS Take 1 capsule by mouth daily.   ELIQUIS 5 MG TABS tablet Take 1 tablet (5 mg total) by mouth 2 (two) times daily.   erythromycin ophthalmic ointment Place 1 Application into the right eye 2 (two) times daily.   Menthol-Camphor (TIGER BALM EXTRA STRENGTH) 11-10 % OINT Apply 1 application  topically See  admin instructions. Apply to affected hips, shoulders, joints, or other painful sites daily as needed for discomfort   methocarbamol (ROBAXIN) 500 MG tablet Take 1 tablet (500 mg total) by mouth 2 (two) times daily as needed. (Patient taking differently: Take 500 mg by mouth 2 (two) times daily as needed for muscle spasms.)   Multiple Vitamins-Minerals (LIVER DETOX PO) Take 2 tablets by mouth daily.   NON FORMULARY Take 2 capsules by mouth See admin instructions. Beet extract capsules- Take 2 capsules by mouth once a day   ondansetron (ZOFRAN) 4 MG tablet Take 1 tablet (4 mg total) by mouth every 8 (eight) hours as needed for nausea or vomiting.   OVER THE  COUNTER MEDICATION Take 1 tablet by mouth 2 (two) times daily. Beet extract   oxybutynin (DITROPAN) 5 MG tablet Take 5 mg by mouth daily.   PRESCRIPTION MEDICATION Place 1 drop into the right eye See admin instructions. Autologous serum eye drops- Instill 1 drop into the right eye 2 times a day   tamsulosin (FLOMAX) 0.4 MG CAPS capsule Take 0.4 mg by mouth daily.   TURMERIC PO Take 1 capsule by mouth daily.   TYLENOL 8 HOUR ARTHRITIS PAIN 650 MG CR tablet Take 650-1,300 mg by mouth every 8 (eight) hours as needed for pain.   venlafaxine XR (EFFEXOR-XR) 150 MG 24 hr capsule Take 150 mg by mouth daily.   Vitamin D, Ergocalciferol, (DRISDOL) 1.25 MG (50000 UNIT) CAPS capsule Take 50,000 Units by mouth every Monday.     Allergies:   Patient has no known allergies.   Social History   Socioeconomic History   Marital status: Divorced    Spouse name: Not on file   Number of children: Not on file   Years of education: Not on file   Highest education level: Not on file  Occupational History   Not on file  Tobacco Use   Smoking status: Former    Types: Cigarettes   Smokeless tobacco: Never  Vaping Use   Vaping Use: Never used  Substance and Sexual Activity   Alcohol use: Yes    Alcohol/week: 2.0 standard drinks of alcohol    Types: 2 Standard drinks or equivalent per week    Comment: occasionally   Drug use: Not Currently   Sexual activity: Not on file  Other Topics Concern   Not on file  Social History Narrative   Not on file   Social Determinants of Health   Financial Resource Strain: Not on file  Food Insecurity: Not on file  Transportation Needs: No Transportation Needs (07/30/2022)   PRAPARE - Hydrologist (Medical): No    Lack of Transportation (Non-Medical): No  Physical Activity: Not on file  Stress: Not on file  Social Connections: Not on file     Family History: The patient's family history is not on file.  ROS:   Please see the  history of present illness.     All other systems reviewed and are negative.  EKGs/Labs/Other Studies Reviewed:    The following studies were reviewed today:   EKG:   10/04/2022: Atrial fibrillation, PVCs, rate 83, right bundle branch block, left axis deviation, poor R wave progression, T wave inversions in leads V4-5  Recent Labs: 07/13/2022: ALT 17; TSH 0.354 07/15/2022: B Natriuretic Peptide 339.5; BUN 10; Creatinine, Ser 0.93; Hemoglobin 10.9; Magnesium 1.5; Platelets 109; Potassium 3.3; Sodium 137  Recent Lipid Panel No results found for: "CHOL", "TRIG", "HDL", "CHOLHDL", "  VLDL", "LDLCALC", "LDLDIRECT"  Physical Exam:    VS:  BP 118/68 (BP Location: Left Arm, Patient Position: Sitting, Cuff Size: Normal)   Pulse 83   Ht 5' 11"$  (1.803 m)   Wt 166 lb 9.6 oz (75.6 kg)   SpO2 96%   BMI 23.24 kg/m     Wt Readings from Last 3 Encounters:  10/04/22 166 lb 9.6 oz (75.6 kg)  07/12/22 149 lb 14.6 oz (68 kg)  03/19/22 150 lb (68 kg)     GEN:  Well nourished, well developed in no acute distress HEENT: Normal NECK: No JVD; No carotid bruits LYMPHATICS: No lymphadenopathy CARDIAC: Irregular, normal rate, no murmurs, rubs, gallops RESPIRATORY:  Clear to auscultation without rales, wheezing or rhonchi  ABDOMEN: Soft, non-tender, non-distended MUSCULOSKELETAL:  No edema; No deformity  SKIN: Warm and dry NEUROLOGIC:  Alert and oriented x 3 PSYCHIATRIC:  Normal affect   ASSESSMENT:    1. Permanent atrial fibrillation ([place])   2. Pre-op evaluation   3. NSVT (nonsustained ventricular tachycardia) ([place])   4. PAD (peripheral artery disease) ([place])   5. Daytime somnolence   6. Hyperlipidemia LDL goal <70   7. Essential hypertension    PLAN:    Atrial fibrillation: Likely permanent Afib, reports has been in Afib for years.  During admission 06/2022 noted to have A-fib with significant bradycardia down to heart rate 30s to 40s and pauses up to 3 seconds.  Home metoprolol digoxin were  discontinued.  Echocardiogram 07/13/2022 showed EF 55 to 60%, normal RV function, mild biatrial enlargement, no significant valvular disease.  Zio patch x 14 days on 08/17/2022 showed 100% A-fib burden, average rate 82 bpm, occasional PVCs (2.6% of beats), 17 episodes of NSVT with longest lasting 17 beats. -Rates appear controlled off AV nodal blockers -Continue Eliquis 5 mg twice daily -Check sleep study  Preop evaluation: planning cataract surgery.  Low risk procedure, no further cardiac workup recommended prior to procedure.  OK to hold Eliquis x 2 days prior to procedure.    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  Appears irregular rhythm during episodes, suspect A-fib with aberrancy.  Check electrolytes, TSH  Hypertension: On amlodipine 5 mg daily.  It appears controlled  Hyperlipidemia: On atorvastatin 40 mg daily.  LDL 48 on 10/23/21  PAD: Reported history, continue Eliquis and atorvastatin. Check ABIs  Daytime somnolence: Check Itamar sleep study.  STOPBANG 3   RTC in 6 months   Medication Adjustments/Labs and Tests Ordered: Current medicines are reviewed at length with the patient today.  Concerns regarding medicines are outlined above.  Orders Placed This Encounter  Procedures   TSH   Comprehensive Metabolic Panel (CMET)   CBC   Lipid panel   EKG 12-Lead   Itamar Sleep Study   VAS [place] ABI WITH/WO TBI   VAS [place] LOWER EXTREMITY ARTERIAL DUPLEX   No orders of the defined types were placed in this encounter.   Patient Instructions   Testing/Procedures:  Your physician has requested that you have an ankle brachial index (ABI). During this test an ultrasound and blood pressure cuff are used to evaluate the arteries that supply the legs with blood. Allow thirty minutes for this exam. There are no restrictions or special instructions. [place] physician has requested that you have a lower extremity arterial duplex.  During this test, ultrasound are used to evaluate arterial blood flow in the legs. Allow one hour for this exam.  There are no restrictions or special instructions. [place]?  Is a FDA cleared portable home sleep study test that uses a watch and 3 points of contact to monitor 7 different channels, including your heart rate, oxygen saturations, body position, snoring, and chest motion.  The study is easy to use from the comfort of your own home and accurately detect sleep apnea.  Before bed, you attach the chest sensor, attached the sleep apnea bracelet to your nondominant hand, and attach the finger probe.  After the study, the raw data is downloaded from the watch and scored for apnea events.   For more information: [email or web address]  Patient Testing Instructions:  Do not put battery into the device until bedtime when you are ready to begin the test. Please call the support number if you need assistance after following the instructions below: 24 hour support line- [phone] or ITAMAR support at [phone] (option 2)  Download the [employer] AmericanWatchPAT One" app through the google play store or App Store  Be sure to turn on or enable access to bluetooth in sett[place] on your smartphone/ device  Make sure no other bluetooth devices are on and within the vicinity of your smartphone/ device and WatchPAT watch during testing.  Make sure to leave your smart phone/ device plugged in and charging all night.  When ready for bed:  Follow the instructions step by step in the WatchPAT One App to activate the testing device. For additional instructions, including video instruction, visit the WatchPAT One video on Youtube. You can search for [facility] One within Youtube (video is 4 minutes and 18 seconds) or enter: https://youtube/[email or web address]?[email or web address] Please note: You will be prompted to enter a Pin to connect via bluetooth when starting the test. The PIN will be assigned to  you when you receive the test.  The device is disposable, but it recommended that you retain the device until you receive a call letting you know the study has been received and the results have been interpreted.  We will let you know if the study did not transmit to [place] properly after the test is completed. You do not need to call us to confirm the receipt of the test.  Please complete the test within 48 hours of receiving PIN.   Frequently Asked Questions:  What is Watch [place] one?  A single use fully disposable home sleep apnea testing device and will not need to be returned after completion.  What are the requirements to use WatchPAT one?  The be able to have a successful watchpat one sleep study, you should have your Watch pat one device, your smart phone, watch pat one app, your PIN number and Internet access What type of phone do I need?  You should have a smart phone that uses Android 5.1 and above or any Iphone with IOS 10 and above How can I download the WatchPAT one app?  Based on your device type search for WatchPAT one app either in google play for android devices or APP store for Iphone's Where will I get my PIN for the study?  Your PIN will be provided by your physician's office. It is used for authentication and if you lose/forget your PIN, please reach out to your providers office.  I do not have Internet at home. Can I do WatchPAT one study?  WatchPAT One needs Internet connection throughout the night to be able to transmit the sleep data. You can use your home/local internet or your cellular's data  package. However, it is always recommended to use home/local Internet. It is estimated that between 20MB-30MB will be used with each study.However, the application will be looking for 80MB space in the phone to start the study.  What happens if I lose internet or bluetooth connection?  During the internet disconnection, your phone will not be able to transmit the sleep data. All the  data, will be stored in your phone. As soon as the internet connection is back on, the phone will being sending the sleep data. During the bluetooth disconnection, WatchPAT one will not be able to to send the sleep data to your phone. Data will be kept in the [facility] one until two devices have bluetooth connection back on. As soon as the connection is back on, WatchPAT one will send the sleep data to the phone.  How long do I need to wear the WatchPAT one?  After you start the study, you should wear the device at least 6 hours.  How far should I keep my phone from the device?  During the night, your phone should be within 15 feet.  What happens if I leave the room for restroom or other reasons?  Leaving the room for any reason will not cause any problem. As soon as your get back to the room, both devices will reconnect and will continue to send the sleep data. Can I use my phone during the sleep study?  Yes, you can use your phone as usual during the study. But it is recommended to put your watchpat one on when you are ready to go to bed.  How will I get my study results?  A soon as you completed your study, your sleep data will be sent to the provider. They will then share the results with you when they are ready.     Follow-Up: At [facility], you and your health needs are our priority.  As part of our continuing mission to provide you with exceptional heart care, we have created designated Provider Care Teams.  These Care Teams include your primary Cardiologist (physician) and Advanced Practice Providers (APPs -  Physician Assistants and Nurse Practitioners) who all work together to provide you with the care you need, when you need it.  We recommend signing up for the patient portal called "MyChart".  Sign up information is provided on this After Visit Summary.  MyChart is used to connect with patients for Virtual Visits (Telemedicine).  Patients are able to view lab/test results,  encounter notes, upcoming appointments, etc.  Non-urgent messages can be sent to your provider as well.   To learn more about what you can do with MyChart, go to [email or web address].    Your next appointment:   6 month(s)  Provider:   Donato Heinz, MD        Signed, Donato Heinz, MD  10/04/2022 10:31 AM    [place]

## 2022-10-04 ENCOUNTER — Ambulatory Visit: Payer: Medicare Other | Attending: Cardiology | Admitting: Cardiology

## 2022-10-04 ENCOUNTER — Encounter: Payer: Self-pay | Admitting: Cardiology

## 2022-10-04 VITALS — BP 118/68 | HR 83 | Ht 71.0 in | Wt 166.6 lb

## 2022-10-04 DIAGNOSIS — Z01818 Encounter for other preprocedural examination: Secondary | ICD-10-CM | POA: Diagnosis not present

## 2022-10-04 DIAGNOSIS — I4821 Permanent atrial fibrillation: Secondary | ICD-10-CM | POA: Diagnosis present

## 2022-10-04 DIAGNOSIS — E785 Hyperlipidemia, unspecified: Secondary | ICD-10-CM | POA: Insufficient documentation

## 2022-10-04 DIAGNOSIS — I739 Peripheral vascular disease, unspecified: Secondary | ICD-10-CM | POA: Insufficient documentation

## 2022-10-04 DIAGNOSIS — I10 Essential (primary) hypertension: Secondary | ICD-10-CM | POA: Insufficient documentation

## 2022-10-04 DIAGNOSIS — I4729 Other ventricular tachycardia: Secondary | ICD-10-CM | POA: Insufficient documentation

## 2022-10-04 DIAGNOSIS — R400 Somnolence: Secondary | ICD-10-CM | POA: Diagnosis present

## 2022-10-04 NOTE — Patient Instructions (Signed)
Testing/Procedures:  Your physician has requested that you have an ankle brachial index (ABI). During this test an ultrasound and blood pressure cuff are used to evaluate the arteries that supply the legs with blood. Allow thirty minutes for this exam. There are no restrictions or special instructions. [place] physician has requested that you have a lower extremity arterial duplex. During this test, ultrasound are used to evaluate arterial blood flow in the legs. Allow one hour for this exam. There are no restrictions or special instructions. [place]?  Is a FDA cleared portable home sleep study test that uses a watch and 3 points of contact to monitor 7 different channels, including your heart rate, oxygen saturations, body position, snoring, and chest motion.  The study is easy to use from the comfort of your own home and accurately detect sleep apnea.  Before bed, you attach the chest sensor, attached the sleep apnea bracelet to your nondominant hand, and attach the finger probe.  After the study, the raw data is downloaded from the watch and scored for apnea events.   For more information: [email or web address]  Patient Testing Instructions:  Do not put battery into the device until bedtime when you are ready to begin the test. Please call the support number if you need assistance after following the instructions below: 24 hour support line- [phone] or ITAMAR support at [phone] (option 2)  Download the [employer] AmericanWatchPAT One" app through the google play store or App Store  Be sure to turn on or enable access to bluetooth in [place] on your smartphone/ device  Make sure no other bluetooth devices are on and within the vicinity of your smartphone/ device and WatchPAT watch during testing.  Make sure to leave your smart phone/ device plugged in and charging all night.  When ready for bed:  Follow the instructions step by step in the  WatchPAT One App to activate the testing device. For additional instructions, including video instruction, visit the WatchPAT One video on Youtube. You can search for [place] One within Youtube (video is 4 minutes and 18 seconds) or enter: https://youtube/watch?[email or web address]_vbiwxE Please note: You will be prompted to enter a Pin to connect via bluetooth when starting the test. The PIN will be assigned to you when you receive the test.  The device is disposable, but it recommended that you retain the device until you receive a call letting you know the study has been received and the results have been interpreted.  We will let you know if the study did not transmit to [place] properly after the test is completed. You do not need to call us to confirm the receipt of the test.  Please complete the test within 48 hours of receiving PIN.   Frequently Asked Questions:  What is Watch [place] one?  A single use fully disposable home sleep apnea testing device and will not need to be returned after completion.  What are the requirements to use WatchPAT one?  The be able to have a successful watchpat one sleep study, you should have your Watch pat one device, your smart phone, watch pat one app, your PIN number and Internet access What type of phone do I need?  You should have a smart phone that uses Android 5.1 and above or any Iphone with IOS 10 and above How can I download the WatchPAT one app?  Based on your device type search for WatchPAT one app either in google play for android devices  or APP store for Iphone's Where will I get my PIN for the study?  Your PIN will be provided by your physician's office. It is used for authentication and if you lose/forget your PIN, please reach out to your providers office.  I do not have Internet at home. Can I do WatchPAT one study?  WatchPAT One needs Internet connection throughout the night to be able to transmit the sleep data. You can use your home/local internet or your  cellular's data package. However, it is always recommended to use home/local Internet. It is estimated that between 20MB-30MB will be used with each study.However, the application will be looking for 80MB space in the phone to start the study.  What happens if I lose internet or bluetooth connection?  During the internet disconnection, your phone will not be able to transmit the sleep data. All the data, will be stored in your phone. As soon as the internet connection is back on, the phone will being sending the sleep data. During the bluetooth disconnection, WatchPAT one will not be able to to send the sleep data to your phone. Data will be kept in the [facility] one until two devices have bluetooth connection back on. As soon as the connection is back on, WatchPAT one will send the sleep data to the phone.  How long do I need to wear the WatchPAT one?  After you start the study, you should wear the device at least 6 hours.  How far should I keep my phone from the device?  During the night, your phone should be within 15 feet.  What happens if I leave the room for restroom or other reasons?  Leaving the room for any reason will not cause any problem. As soon as your get back to the room, both devices will reconnect and will continue to send the sleep data. Can I use my phone during the sleep study?  Yes, you can use your phone as usual during the study. But it is recommended to put your watchpat one on when you are ready to go to bed.  How will I get my study results?  A soon as you completed your study, your sleep data will be sent to the provider. They will then share the results with you when they are ready.     Follow-Up: At [facility], you and your health needs are our priority.  As part of our continuing mission to provide you with exceptional heart care, we have created designated Provider Care Teams.  These Care Teams include your primary Cardiologist (physician) and Advanced  Practice Providers (APPs -  Physician Assistants and Nurse Practitioners) who all work together to provide you with the care you need, when you need it.  We recommend signing up for the patient portal called "MyChart".  Sign up information is provided on this After Visit Summary.  MyChart is used to connect with patients for Virtual Visits (Telemedicine).  Patients are able to view lab/test results, encounter notes, upcoming appointments, etc.  Non-urgent messages can be sent to your provider as well.   To learn more about what you can do with MyChart, go to [email or web address].    Your next appointment:   6 month(s)  Provider:   Donato Heinz, MD

## 2022-10-05 ENCOUNTER — Telehealth: Payer: Self-pay | Admitting: *Deleted

## 2022-10-05 LAB — CBC
Hematocrit: 38.6 % (ref 37.5–51.0)
Hemoglobin: 12.9 g/dL — ABNORMAL LOW (ref 13.0–17.7)
MCH: 32.9 pg (ref 26.6–33.0)
MCHC: 33.4 g/dL (ref 31.5–35.7)
MCV: 99 fL — ABNORMAL HIGH (ref 79–97)
Platelets: 174 10*3/uL (ref 150–450)
RBC: 3.92 x10E6/uL — ABNORMAL LOW (ref 4.14–5.80)
RDW: 13.3 % (ref 11.6–15.4)
WBC: 4.8 10*3/uL (ref 3.4–10.8)

## 2022-10-05 LAB — COMPREHENSIVE METABOLIC PANEL
ALT: 16 IU/L (ref 0–44)
AST: 29 IU/L (ref 0–40)
Albumin/Globulin Ratio: 2 (ref 1.2–2.2)
Albumin: 4.5 g/dL (ref 3.8–4.8)
Alkaline Phosphatase: 75 IU/L (ref 44–121)
BUN/Creatinine Ratio: 23 (ref 10–24)
BUN: 17 mg/dL (ref 8–27)
Bilirubin Total: 0.4 mg/dL (ref 0.0–1.2)
CO2: 24 mmol/L (ref 20–29)
Calcium: 8.8 mg/dL (ref 8.6–10.2)
Chloride: 102 mmol/L (ref 96–106)
Creatinine, Ser: 0.73 mg/dL — ABNORMAL LOW (ref 0.76–1.27)
Globulin, Total: 2.3 g/dL (ref 1.5–4.5)
Glucose: 80 mg/dL (ref 70–99)
Potassium: 3.8 mmol/L (ref 3.5–5.2)
Sodium: 143 mmol/L (ref 134–144)
Total Protein: 6.8 g/dL (ref 6.0–8.5)
eGFR: 96 mL/min/{1.73_m2} (ref >59)

## 2022-10-05 LAB — LIPID PANEL
Chol/HDL Ratio: 1.8 ratio (ref 0.0–5.0)
Cholesterol, Total: 153 mg/dL (ref 100–199)
HDL: 87 mg/dL (ref >39)
LDL Chol Calc (NIH): 48 mg/dL (ref 0–99)
Triglycerides: 105 mg/dL (ref 0–149)
VLDL Cholesterol Cal: 18 mg/dL (ref 5–40)

## 2022-10-05 LAB — TSH: TSH: 0.973 u[IU]/mL (ref 0.450–4.500)

## 2022-10-05 NOTE — Telephone Encounter (Signed)
Secure chat sent to Taylor Myers ok to activate itamar device.

## 2022-10-06 ENCOUNTER — Telehealth: Payer: Self-pay

## 2022-10-06 ENCOUNTER — Encounter: Payer: Self-pay | Admitting: *Deleted

## 2022-10-06 NOTE — Telephone Encounter (Signed)
Called and made the patient aware that HE may proceed with the [facility] Sleep Study. PIN # provided to the patient. Patient made aware that HE will be contacted after the test has been read with the results and any recommendations. Patient verbalized understanding and thanked me for the call.

## 2022-10-06 NOTE — Telephone Encounter (Signed)
PATIENT BROTHER RICHARD RETURN MY CALL CONCERNING HIS BROTHER ITAMAR DEVICE, HE STATES THAT HE WILL HAVE HIS BROTHER Vahe TO CALL ME BACK WHEN HE IS ABLE TO GET IN TOUCH WITH HIM.

## 2022-10-06 NOTE — Telephone Encounter (Signed)
I called the patient concerning his Itamar Device, unable to LVM due to it being full. I called the patient brother Delfino Lovett and LVM for him to have his brother Merik to return my call.

## 2022-10-07 ENCOUNTER — Encounter (INDEPENDENT_AMBULATORY_CARE_PROVIDER_SITE_OTHER): Payer: Medicare Other | Admitting: Cardiology

## 2022-10-07 DIAGNOSIS — G4733 Obstructive sleep apnea (adult) (pediatric): Secondary | ICD-10-CM

## 2022-10-09 ENCOUNTER — Ambulatory Visit: Payer: Medicare Other | Attending: Cardiology

## 2022-10-09 DIAGNOSIS — R400 Somnolence: Secondary | ICD-10-CM

## 2022-10-09 NOTE — Procedures (Signed)
SLEEP STUDY REPORT Patient Information Study Date: 10/07/2022 Patient Name: Taylor Myers Patient ID: [id number] Birth Date: Dec 27, 1950 Age: 72 Gender: Male BMI: 23.1 (W=165 lb, H=5' 11'') Referring Physician: Oswaldo Milian, MD  TEST DESCRIPTION: Home sleep apnea testing was completed using the WatchPat, a Type 1 device, utilizing peripheral arterial tonometry (PAT), chest movement, actigraphy, pulse oximetry, pulse rate, body position and snore.  AHI was calculated with apnea and hypopnea using valid sleep time as the denominator. RDI includes apneas, hypopneas, and RERAs.  The data acquired and the scoring of sleep and all associated events were performed in accordance with the recommended standards and specifications as outlined in the AASM Manual for the Scoring of Sleep and Associated Events 2.2.0 (2015).  FINDINGS:  1.  Severe Obstructive Sleep Apnea with AHI /30.9hr.   2.  No Central Sleep Apnea with pAHIc 0.6/hr.  3.  Oxygen desaturations as low as 77%.  4.  Mild snoring was present. O2 sats were < 88% for 5.3 min.  5.  Total sleep time was 7 hrs and 9 min.  6.  21.3% of total sleep time was spent in REM sleep.   7.  Shortened sleep onset latency at 6 min.   8.  Shortened REM sleep onset latency at 76 min.   9.  Total awakenings were 10.  10. Arrhythmia detection:  Suggestive of possible brief atrial fibrillation lasting 3 hr 69mn and 44 seconds.  This is not diagnostic and further testing with outpatient telemetry monitoring is recommended.  DIAGNOSIS:   Severe Obstructive Sleep Apnea (G47.33) Nocturnal Hypoxemia Possible Atrial Fibrillation  RECOMMENDATIONS:   1.  Clinical correlation of these findings is necessary.  The decision to treat obstructive sleep apnea (OSA) is usually based on the presence of apnea symptoms or the presence of associated medical conditions such as Hypertension, Congestive Heart Failure, Atrial Fibrillation or Obesity.  The most common  symptoms of OSA are snoring, gasping for breath while sleeping, daytime sleepiness and fatigue.   2.  Initiating apnea therapy is recommended given the presence of symptoms and/or associated conditions. Recommend proceeding with one of the following:     a.  Auto-CPAP therapy with a pressure range of 5-20cm H2O.     b.  An oral appliance (OA) that can be obtained from certain dentists with expertise in sleep medicine.  These are primarily of use in non-obese patients with mild and moderate disease.     c.  An ENT consultation which may be useful to look for specific causes of obstruction and possible treatment options.     d.  If patient is intolerant to PAP therapy, consider referral to ENT for evaluation for hypoglossal nerve stimulator.   3.  Close follow-up is necessary to ensure success with CPAP or oral appliance therapy for maximum benefit.  4.  A follow-up oximetry study on CPAP is recommended to assess the adequacy of therapy and determine the need for supplemental oxygen or the potential need for Bi-level therapy.  An arterial blood gas to determine the adequacy of baseline ventilation and oxygenation should also be considered.  5.  Healthy sleep recommendations include:  adequate nightly sleep (normal 7-9 hrs/night), avoidance of caffeine after noon and alcohol near bedtime, and maintaining a sleep environment that is cool, dark and quiet.  6.  Weight loss for overweight patients is recommended.  Even modest amounts of weight loss can significantly improve the severity of sleep apnea.  7.  Snoring recommendations include:  weight  loss where appropriate, side sleeping, and avoidance of alcohol before bed.  8.  Operation of motor vehicle should be avoided when sleepy.  9.  Consider outpatient event monitor if clinically indicated to assess for atrial fibrillation.  Signature: Fransico Him, MD; [facility]; [place], [place] Board of Sleep Medicine Electronically Signed: 10/09/2022

## 2022-10-11 ENCOUNTER — Other Ambulatory Visit: Payer: Self-pay | Admitting: Cardiology

## 2022-10-11 ENCOUNTER — Telehealth: Payer: Self-pay | Admitting: *Deleted

## 2022-10-11 DIAGNOSIS — G4733 Obstructive sleep apnea (adult) (pediatric): Secondary | ICD-10-CM

## 2022-10-11 DIAGNOSIS — G4736 Sleep related hypoventilation in conditions classified elsewhere: Secondary | ICD-10-CM

## 2022-10-11 NOTE — Telephone Encounter (Signed)
Patient notified of sleep study results and recommendations. He agrees to proceed with CPAP titration sleep study after all of his questions had been addressed.

## 2022-10-12 ENCOUNTER — Ambulatory Visit: Payer: Self-pay

## 2022-10-12 NOTE — Patient Instructions (Signed)
Visit Information  Thank you for taking time to visit with me today. Please don't hesitate to contact me if I can be of assistance to you.   Following are the goals we discussed today:   Goals Addressed               This Visit's Progress     Patient Stated     I have to have eye surgery (pt-stated)        Care Coordination Interventions: Evaluation of current treatment plan related to cataract extraction and patient's adherence to plan as established by provider Discussed patient contacted his Ophthalmologist in order to have his eye surgery rescheduled Discussed patient will complete a face to face visit for measurements and pre-assessment prior to having his procedure Confirmed patient will have help with transportation and help following the procedure within his home Educated patient regarding [facility] SW and care guide availability if resources are needed, patient declines at this time       I want to continue to work with PT and OT to help me get stronger (pt-stated)        Care Coordination Interventions: Evaluation of current treatment plan related to impaired physical mobility and patient's adherence to plan as established by provider Determined patient continues to work on strengthening and endurance with a Physical Therapist, he feels he is making progress and is happy with the service he is receiving Encouraged patient to continue to adhere to his HEP as directed for the best results       COMPLETED: To stay hydrated and rested (pt-stated)        Care Coordination Interventions: Evaluation of current treatment plan related to physical exhaustion and patient's adherence to plan as established by provider Determined patient reports his exhaustion and dehydration have resolved, his 74 yr olf father has returned home and he has caregivers available to help care for his father  Instructed patient to keep his doctor well informed of new symptoms or concerns and to continue to stay  well hydrated with water and balance his activity with rest  To complete evaluation for CPAP for OSA  Care Coordination Interventions: Evaluation of current treatment plan related to severe OSA with CPAP usage and patient's adherence to plan as established by provider Determined patient completed a [employer] Sleep WatchPat, a Type 1 device, utilizing peripheral arterial tonometry (PAT) sleep study Reviewed and discussed with patient his diagnosis of severe sleep apnea; Nocturnal Hypoxemia; Possible Atrial Fibrillation Review of patient status, including review of consultant's reports, relevant laboratory and other test results Reviewed plan for patient to complete a CPAP titration to help initiate usage, patient confirms he will have transportation for his appointment        Our next appointment is by telephone on 11/09/22 at 12:30 PM  Please call the care guide team at [phone] if you need to cancel or reschedule your appointment.   If you are experiencing a Mental Health or [facility] or need someone to talk to, please call 1-800-273-TALK (toll free, 24 hour hotline) go to [facility] [address] [place] [phone])  Patient verbalizes understanding of instructions and care plan provided today and agrees to view in [facility]. Active MyChart status and patient understanding of how to access instructions and care plan via MyChart confirmed with patient.     Barb Merino, RN, BSN, CCM Care Management Coordinator [facility] Care Management Direct Phone: [phone]

## 2022-10-12 NOTE — Patient Outreach (Signed)
  Care Coordination   Follow Up Visit Note   10/12/2022 Name: Taylor Myers MRN: [id number] DOB: [birth year]  Taylor Myers is a 72 y.o. year old male who sees Tisovec, Fransico Him, MD for primary care. I spoke with  Taylor Myers by phone today.  What matters to the patients health and wellness today?  Patient would like to have his right eye Cataract removed and is awaiting on this procedure getting scheduled.     Goals Addressed               This Visit's Progress     Patient Stated     I have to have eye surgery (pt-stated)        Care Coordination Interventions: Evaluation of current treatment plan related to cataract extraction and patient's adherence to plan as established by provider Discussed patient contacted his Ophthalmologist in order to have his eye surgery rescheduled Discussed patient will complete a face to face visit for measurements and pre-assessment prior to having his procedure Confirmed patient will have help with transportation and help following the procedure within his home Educated patient regarding [facility] SW and care guide availability if resources are needed, patient declines at this time       I want to continue to work with PT and OT to help me get stronger (pt-stated)        Care Coordination Interventions: Evaluation of current treatment plan related to impaired physical mobility and patient's adherence to plan as established by provider Determined patient continues to work on strengthening and endurance with a Physical Therapist, he feels he is making progress and is happy with the service he is receiving Encouraged patient to continue to adhere to his HEP as directed for the best results       COMPLETED: To stay hydrated and rested (pt-stated)        Care Coordination Interventions: Evaluation of current treatment plan related to physical exhaustion and patient's adherence to plan as established by provider Determined patient reports his exhaustion and  dehydration have resolved, his 44 yr olf father has returned home and he has caregivers available to help care for his father  Instructed patient to keep his doctor well informed of new symptoms or concerns and to continue to stay well hydrated with water and balance his activity with rest  To complete evaluation for CPAP for OSA  Care Coordination Interventions: Evaluation of current treatment plan related to severe OSA with CPAP usage and patient's adherence to plan as established by provider Determined patient completed a [employer] Sleep WatchPat, a Type 1 device, utilizing peripheral arterial tonometry (PAT) sleep study Reviewed and discussed with patient his diagnosis of severe sleep apnea; Nocturnal Hypoxemia; Possible Atrial Fibrillation Review of patient status, including review of consultant's reports, relevant laboratory and other test results Reviewed plan for patient to complete a CPAP titration to help initiate usage, patient confirms he will have transportation for his appointment         SDOH assessments and interventions completed:  No     Care Coordination Interventions:  Yes, provided   Follow up plan: Follow up call scheduled for 11/09/22 @12$ :30 PM    Encounter Outcome:  Pt. Visit Completed

## 2022-10-15 ENCOUNTER — Encounter: Payer: Self-pay | Admitting: Cardiology

## 2022-10-15 ENCOUNTER — Ambulatory Visit (HOSPITAL_COMMUNITY)
Admission: RE | Admit: 2022-10-15 | Discharge: 2022-10-15 | Disposition: A | Discharge status: Home / Self Care | Payer: Medicare Other | Source: Ambulatory Visit | Attending: Cardiology | Admitting: Cardiology

## 2022-10-15 DIAGNOSIS — I739 Peripheral vascular disease, unspecified: Secondary | ICD-10-CM | POA: Insufficient documentation

## 2022-10-16 LAB — VAS US ABI WITH/WO TBI
Left ABI: 1.32
Right ABI: 1.3

## 2022-10-20 ENCOUNTER — Encounter: Payer: Self-pay | Admitting: *Deleted

## 2022-11-09 ENCOUNTER — Ambulatory Visit: Payer: Self-pay

## 2022-11-09 NOTE — Patient Outreach (Signed)
  Care Coordination   11/09/2022 Name: Taylor Myers MRN: [id number] DOB: 1951/01/12   Care Coordination Outreach Attempts:  An unsuccessful telephone outreach was attempted for a scheduled appointment today.  Follow Up Plan:  Additional outreach attempts will be made to offer the patient care coordination information and services.   Encounter Outcome:  No Answer   Care Coordination Interventions:  No, not indicated    Barb Merino, RN, BSN, CCM Care Management Coordinator [facility] Care Management  Direct Phone: [phone]

## 2022-11-17 ENCOUNTER — Telehealth: Payer: Self-pay | Admitting: *Deleted

## 2022-11-17 NOTE — Progress Notes (Signed)
  Care Coordination Note  11/17/2022 Name: Taylor Myers MRN: [id number] DOB: 31-Jul-1951  Taylor Myers is a 72 y.o. year old male who is a primary care patient of Tisovec, Fransico Him, MD and is actively engaged with the care management team. I reached out to Vivi Ferns by phone today to assist with re-scheduling a follow up visit with the RN Case Manager  Follow up plan: Unsuccessful telephone outreach attempt made.   [place]  Direct Dial: [phone]

## 2022-11-18 ENCOUNTER — Emergency Department (HOSPITAL_COMMUNITY)
Admission: EM | Admit: 2022-11-18 | Discharge: 2022-11-19 | Disposition: A | Discharge status: Home / Self Care | Payer: Medicare Other | Attending: Emergency Medicine | Admitting: Emergency Medicine

## 2022-11-18 ENCOUNTER — Emergency Department (HOSPITAL_COMMUNITY): Payer: Medicare Other

## 2022-11-18 ENCOUNTER — Encounter (HOSPITAL_COMMUNITY): Payer: Self-pay

## 2022-11-18 ENCOUNTER — Other Ambulatory Visit: Payer: Self-pay

## 2022-11-18 DIAGNOSIS — I609 Nontraumatic subarachnoid hemorrhage, unspecified: Secondary | ICD-10-CM

## 2022-11-18 DIAGNOSIS — S066X0A Traumatic subarachnoid hemorrhage without loss of consciousness, initial encounter: Secondary | ICD-10-CM | POA: Insufficient documentation

## 2022-11-18 DIAGNOSIS — Z79899 Other long term (current) drug therapy: Secondary | ICD-10-CM | POA: Diagnosis not present

## 2022-11-18 DIAGNOSIS — S0990XA Unspecified injury of head, initial encounter: Secondary | ICD-10-CM | POA: Diagnosis present

## 2022-11-18 DIAGNOSIS — S80212A Abrasion, left knee, initial encounter: Secondary | ICD-10-CM | POA: Insufficient documentation

## 2022-11-18 DIAGNOSIS — Z7901 Long term (current) use of anticoagulants: Secondary | ICD-10-CM | POA: Insufficient documentation

## 2022-11-18 DIAGNOSIS — F10920 Alcohol use, unspecified with intoxication, uncomplicated: Secondary | ICD-10-CM

## 2022-11-18 DIAGNOSIS — Y908 Blood alcohol level of 240 mg/100 ml or more: Secondary | ICD-10-CM | POA: Diagnosis not present

## 2022-11-18 DIAGNOSIS — I10 Essential (primary) hypertension: Secondary | ICD-10-CM | POA: Insufficient documentation

## 2022-11-18 DIAGNOSIS — W1830XA Fall on same level, unspecified, initial encounter: Secondary | ICD-10-CM | POA: Diagnosis not present

## 2022-11-18 LAB — CBC WITH DIFFERENTIAL/PLATELET
Abs Immature Granulocytes: 0.02 10*3/uL (ref 0.00–0.07)
Basophils Absolute: 0.1 10*3/uL (ref 0.0–0.1)
Basophils Relative: 1 %
Eosinophils Absolute: 0.1 10*3/uL (ref 0.0–0.5)
Eosinophils Relative: 1 %
HCT: 36.7 % — ABNORMAL LOW (ref 39.0–52.0)
Hemoglobin: 12.7 g/dL — ABNORMAL LOW (ref 13.0–17.0)
Immature Granulocytes: 0 %
Lymphocytes Relative: 23 %
Lymphs Abs: 1.1 10*3/uL (ref 0.7–4.0)
MCH: 33.6 pg (ref 26.0–34.0)
MCHC: 34.6 g/dL (ref 30.0–36.0)
MCV: 97.1 fL (ref 80.0–100.0)
Monocytes Absolute: 0.3 10*3/uL (ref 0.1–1.0)
Monocytes Relative: 7 %
Neutro Abs: 3.4 10*3/uL (ref 1.7–7.7)
Neutrophils Relative %: 68 %
Platelets: 187 10*3/uL (ref 150–400)
RBC: 3.78 MIL/uL — ABNORMAL LOW (ref 4.22–5.81)
RDW: 13 % (ref 11.5–15.5)
WBC: 4.9 10*3/uL (ref 4.0–10.5)
nRBC: 0 % (ref 0.0–0.2)

## 2022-11-18 LAB — COMPREHENSIVE METABOLIC PANEL
ALT: 13 U/L (ref 0–44)
AST: 25 U/L (ref 15–41)
Albumin: 3.5 g/dL (ref 3.5–5.0)
Alkaline Phosphatase: 61 U/L (ref 38–126)
Anion gap: 12 (ref 5–15)
BUN: 25 mg/dL — ABNORMAL HIGH (ref 8–23)
CO2: 25 mmol/L (ref 22–32)
Calcium: 9.2 mg/dL (ref 8.9–10.3)
Chloride: 104 mmol/L (ref 98–111)
Creatinine, Ser: 0.84 mg/dL (ref 0.61–1.24)
GFR, Estimated: >60 mL/min (ref >60)
Glucose, Bld: 116 mg/dL — ABNORMAL HIGH (ref 70–99)
Potassium: 3.7 mmol/L (ref 3.5–5.1)
Sodium: 141 mmol/L (ref 135–145)
Total Bilirubin: 0.8 mg/dL (ref 0.3–1.2)
Total Protein: 6.8 g/dL (ref 6.5–8.1)

## 2022-11-18 LAB — ETHANOL: Alcohol, Ethyl (B): 410 mg/dL (ref <10)

## 2022-11-18 LAB — TROPONIN I (HIGH SENSITIVITY): Troponin I (High Sensitivity): 17 ng/L (ref <18)

## 2022-11-18 MED ORDER — ACETAMINOPHEN 325 MG PO TABS
650.0000 mg | ORAL_TABLET | Freq: Once | ORAL | Status: AC
  Administered 2022-11-19: 650 mg via ORAL
  Filled 2022-11-18 (×2): qty 2

## 2022-11-18 MED ORDER — SODIUM CHLORIDE 0.9 % IV BOLUS
1000.0000 mL | Freq: Once | INTRAVENOUS | Status: AC
  Administered 2022-11-18: 1000 mL via INTRAVENOUS

## 2022-11-18 NOTE — ED Provider Notes (Addendum)
[facility] Provider Note   CSN: [id number] Arrival date & time: 11/18/22  1921     History  Chief Complaint  Patient presents with   Taylor Myers is a 72 y.o. male history of A-fib on Eliquis, hypertension, alcohol abuse here presenting with alcohol intoxication and fall.  Patient was drinking alcohol today and cannot tell me how he fell.  He apparently had multiple falls.  Patient had a first fall and initially refused transport to the hospital.  Patient apparently had more alcohol and was too weak to walk to the bedroom and apparently had another fall and hit his knees.  He cannot tell me if he had a head injury or not.  Level 2 trauma was not activated since patient was not clear if he hit his head or not.  The history is provided by the patient.       Home Medications Prior to Admission medications   Medication Sig Start Date End Date Taking? Authorizing Provider  ALEVE 220 MG tablet Take 220-440 mg by mouth 2 (two) times daily as needed (for pain). Patient not taking: Reported on 10/12/2022    [provider]  allopurinol (ZYLOPRIM) 300 MG tablet Take 300 mg by mouth daily. 04/18/21   [provider]  amLODipine (NORVASC) 5 MG tablet Take 5 mg by mouth daily. 03/18/21   [provider]  atorvastatin (LIPITOR) 40 MG tablet Take 40 mg by mouth daily. 03/18/21   [provider]  CLEAR EYES PURE RELIEF PF 0.25 % SOLN Place 1 drop into both eyes See admin instructions. Instill 1 drop into both eyes two to three times a day as needed for irritation    [provider]  DHA-EPA-Flaxseed Oil-Vitamin E (THERATEARS NUTRITION) CAPS Take 1 capsule by mouth daily.    [provider]  ELIQUIS 5 MG TABS tablet Take 1 tablet (5 mg total) by mouth 2 (two) times daily. 07/15/22   Caren Griffins, MD  erythromycin ophthalmic ointment Place 1 Application into the right eye 2 (two) times daily.  01/04/22   [provider]  Menthol-Camphor (TIGER BALM EXTRA STRENGTH) 11-10 % OINT Apply 1 application  topically See admin instructions. Apply to affected hips, shoulders, joints, or other painful sites daily as needed for discomfort    [provider]  methocarbamol (ROBAXIN) 500 MG tablet Take 1 tablet (500 mg total) by mouth 2 (two) times daily as needed. Patient taking differently: Take 500 mg by mouth 2 (two) times daily as needed for muscle spasms. 03/15/22   Aundra Dubin, PA-C  Multiple Vitamins-Minerals (LIVER DETOX PO) Take 2 tablets by mouth daily.    [provider]  NON FORMULARY Take 2 capsules by mouth See admin instructions. Beet extract capsules- Take 2 capsules by mouth once a day    [provider]  ondansetron (ZOFRAN) 4 MG tablet Take 1 tablet (4 mg total) by mouth every 8 (eight) hours as needed for nausea or vomiting. 03/15/22   Aundra Dubin, PA-C  OVER THE COUNTER MEDICATION Take 1 tablet by mouth 2 (two) times daily. Beet extract    [provider]  oxybutynin (DITROPAN) 5 MG tablet Take 5 mg by mouth daily. 04/18/21   [provider]  oxyCODONE-acetaminophen (PERCOCET) 5-325 MG tablet Take 1-2 tablets by mouth every 6 (six) hours as needed for severe pain. 03/22/22 03/22/23  Aundra Dubin, PA-C  PRESCRIPTION MEDICATION Place  1 drop into the right eye See admin instructions. Autologous serum eye drops- Instill 1 drop into the right eye 2 times a day    [provider]  tamsulosin (FLOMAX) 0.4 MG CAPS capsule Take 0.4 mg by mouth daily.    [provider]  TURMERIC PO Take 1 capsule by mouth daily.    [provider]  TYLENOL 8 HOUR ARTHRITIS PAIN 650 MG CR tablet Take 650-1,300 mg by mouth every 8 (eight) hours as needed for pain.    [provider]  venlafaxine XR (EFFEXOR-XR) 150 MG 24 hr capsule Take 150 mg by mouth daily. 04/18/21   [provider]  Vitamin D,  Ergocalciferol, (DRISDOL) 1.25 MG (50000 UNIT) CAPS capsule Take 50,000 Units by mouth every Monday.    [provider]      Allergies    Patient has no known allergies.    Review of Systems   Review of Systems  Musculoskeletal:        L knee pain   All other systems reviewed and are negative.   Physical Exam Updated Vital Signs BP 112/66 (BP Location: Left Arm)   Pulse (!) 108   Temp 98.5 F (36.9 C) (Oral)   Resp 20   Ht 5\' 9"  (1.753 m)   Wt 72.6 kg   SpO2 93%   BMI 23.63 kg/m  Physical Exam Vitals and nursing note reviewed.  Constitutional:      Comments: Intoxicated  HENT:     Head:     Comments: Abrasion on the left forehead    Nose: Nose normal.     Mouth/Throat:     Mouth: Mucous membranes are moist.  Eyes:     Extraocular Movements: Extraocular movements intact.     Pupils: Pupils are equal, round, and reactive to light.  Cardiovascular:     Rate and Rhythm: Normal rate and regular rhythm.     Pulses: Normal pulses.     Heart sounds: Normal heart sounds.  Pulmonary:     Effort: Pulmonary effort is normal.     Breath sounds: Normal breath sounds.  Abdominal:     General: Abdomen is flat.     Palpations: Abdomen is soft.  Musculoskeletal:     Cervical back: Normal range of motion and neck supple.     Comments: Abrasion of the left knee  Skin:    General: Skin is warm.     Capillary Refill: Capillary refill takes less than 2 seconds.  Neurological:     Comments: Intoxicated but moving all extremities  Psychiatric:     Comments: Unable      ED Results / Procedures / Treatments   Labs (all labs ordered are listed, but only abnormal results are displayed) Labs Reviewed  CBC WITH DIFFERENTIAL/PLATELET - Abnormal; Notable for the following components:      Result Value   RBC 3.78 (*)    Hemoglobin 12.7 (*)    HCT 36.7 (*)    All other components within normal limits  COMPREHENSIVE METABOLIC PANEL - Abnormal; Notable for the following  components:   Glucose, Bld 116 (*)    BUN 25 (*)    All other components within normal limits  ETHANOL - Abnormal; Notable for the following components:   Alcohol, Ethyl (B) 410 (*)    All other components within normal limits  TROPONIN I (HIGH SENSITIVITY)    EKG None  Radiology DG Pelvis 1-2 Views  Result Date: 11/18/2022 CLINICAL DATA:  Recent fall with hip pain, initial encounter EXAM: PELVIS - 1 VIEW COMPARISON:  None Available. FINDINGS: Pelvic ring is intact. Right hip replacement is noted. No acute fracture or dislocation is noted. Degenerative changes of the left hip are seen. IMPRESSION: No acute abnormality noted. Electronically Signed   By: Inez Catalina M.D.   On: 11/18/2022 20:46   DG Knee Complete 4 Views Left  Result Date: 11/18/2022 CLINICAL DATA:  Recent fall with left knee pain, initial encounter EXAM: LEFT KNEE - COMPLETE 4+ VIEW COMPARISON:  None Available. FINDINGS: Left knee prosthesis is seen. No acute fracture or loosening is seen. No joint effusion is noted. IMPRESSION: Postsurgical changes without acute abnormality. Electronically Signed   By: Inez Catalina M.D.   On: 11/18/2022 20:45   DG Chest 1 View  Result Date: 11/18/2022 CLINICAL DATA:  Recent fall EXAM: PORTABLE CHEST 1 VIEW COMPARISON:  07/12/2022 FINDINGS: Cardiac shadow is enlarged. Aortic calcifications are again seen. The lungs are well aerated bilaterally. No focal infiltrate or effusion is seen. No bony abnormality is noted. IMPRESSION: No acute abnormality noted. Electronically Signed   By: Inez Catalina M.D.   On: 11/18/2022 20:40    Procedures Procedures    Medications Ordered in ED Medications  acetaminophen (TYLENOL) tablet 650 mg (650 mg Oral Not Given 11/18/22 2012)  sodium chloride 0.9 % bolus 1,000 mL (1,000 mLs Intravenous New Bag/Given 11/18/22 2010)    ED Course/ Medical Decision Making/ A&P                             Medical Decision Making Taylor Myers is a 72 y.o. male here  with alcohol intoxication and multiple falls.  He does have signs of head injury.  Will get CT head and cervical spine.  Will also get CBC and CMP and alcohol level and left knee x-ray.   11:17 PM CT head showed left temporal subarachnoid hemorrhage.  I discussed case with Dr. Christella Noa from neurosurgery.  He states that this is a very small hemorrhage and does not need neurosurgical intervention and recommend discontinue eliquis.  Patient's alcohol level is 400 so we will need to sober up. I have ordered a repeat CT head at 5 AM (6 hours from now).  Signed out to Dr. Dayna Barker in the ED  Problems Addressed: Alcoholic intoxication without complication: acute illness or injury Subarachnoid hemorrhage: acute illness or injury  Amount and/or Complexity of Data Reviewed Labs: ordered. Decision-making details documented in ED Course. Radiology: ordered and independent interpretation performed. Decision-making details documented in ED Course. ECG/medicine tests: ordered and independent interpretation performed. Decision-making details documented in ED Course.  Risk OTC drugs.    Final Clinical Impression(s) / ED Diagnoses Final diagnoses:  None    Rx / DC Orders ED Discharge Orders     None         Drenda Freeze, MD 11/18/22 2318    Drenda Freeze, MD 11/18/22 2325

## 2022-11-18 NOTE — Progress Notes (Signed)
Patient ID: Taylor Myers, male   DOB: December 09, 1950, 72 y.o.   MRN: [id number] Films reviewed.  No indications for treatment. Etoh level greater than 400 currently. No repeat scan needed. Must have eliquis discontinued.

## 2022-11-18 NOTE — ED Triage Notes (Signed)
Pt arrives via [facility] from home after falling two times today at home.   1st fall EMS was called out to pt's home by his father to help patient off the floor. EMS found patient beside on the floor prone beside his bed. Assisted patient into his wheelchair. Pt refused being transported to the hospital.   After dinner this evening, patient had another fall. This incident, patient states he ate dinner as well as consumed approx 2-3 oz of Vodka. Pt was too weak to walk to his bedroom, thus falling.   Pt did not hit his head either fall. Pt is on Eliquis and denies any new injury or pain. Pt has chronic pain.   FATHERGaylord Shih [phone]  Appointed [facility] [phone]

## 2022-11-18 NOTE — ED Provider Notes (Signed)
11:24 PM Assumed care from Dr. Darl Householder, please see their note for full history, physical and decision making until this point. In brief this is a 72 y.o. year old male who presented to the ED tonight with Fall     EtOH of >400, SAH on eliquis, previous provider spoke with Dr. Christella Noa who did not think repeat CT or further workup/consult needed. Plan for clinical sobriety and repeat CT at 6 hour mark and clinical sobriety. Hold eliquis until follow up w/ outside providers.   Repeat ct unchanged. Will hold eliquis and d/w cardiologist regarding when to start it back. Is normally in a wheelchair. States he feels fine. Appears to be at likely neuro baseline. Will hold on etoh.   Discharge instructions, including strict return precautions for new or worsening symptoms, given. Patient and/or family verbalized understanding and agreement with the plan as described.   Labs, studies and imaging reviewed by myself and considered in medical decision making if ordered. Imaging interpreted by radiology.  Labs Reviewed  CBC WITH DIFFERENTIAL/PLATELET - Abnormal; Notable for the following components:      Result Value   RBC 3.78 (*)    Hemoglobin 12.7 (*)    HCT 36.7 (*)    All other components within normal limits  COMPREHENSIVE METABOLIC PANEL - Abnormal; Notable for the following components:   Glucose, Bld 116 (*)    BUN 25 (*)    All other components within normal limits  ETHANOL - Abnormal; Notable for the following components:   Alcohol, Ethyl (B) 410 (*)    All other components within normal limits  TROPONIN I (HIGH SENSITIVITY)    CT HEAD WO CONTRAST (5MM)  Final Result    CT Cervical Spine Wo Contrast  Final Result    DG Chest 1 View  Final Result    DG Pelvis 1-2 Views  Final Result    DG Knee Complete 4 Views Left  Final Result    CT HEAD WO CONTRAST (5MM)    (Results Pending)    No follow-ups on file.    Taylor Myers, Corene Cornea, MD 11/19/22 [phone]

## 2022-11-18 NOTE — ED Notes (Signed)
Patient transported to CT 

## 2022-11-18 NOTE — ED Notes (Signed)
Provider notified of critical lab result.  

## 2022-11-18 NOTE — ED Notes (Signed)
This RN assisted patient with urinal.

## 2022-11-19 ENCOUNTER — Emergency Department (HOSPITAL_COMMUNITY): Payer: Medicare Other

## 2022-11-19 DIAGNOSIS — S066X0A Traumatic subarachnoid hemorrhage without loss of consciousness, initial encounter: Secondary | ICD-10-CM | POA: Diagnosis not present

## 2022-11-19 NOTE — ED Notes (Signed)
[facility] called for transport 

## 2022-11-22 NOTE — Progress Notes (Signed)
  Care Coordination Note  11/22/2022 Name: Taylor Myers MRN: [id number] DOB: March 24, 1951  Taylor Myers is a 72 y.o. year old male who is a primary care patient of Tisovec, Fransico Him, MD and is actively engaged with the care management team. I reached out to Vivi Ferns by phone today to assist with re-scheduling a follow up visit with the RN Case Manager  Follow up plan: Telephone appointment with care management team member scheduled for:12/01/22  [place]: [phone]

## 2022-11-24 ENCOUNTER — Telehealth: Payer: Self-pay

## 2022-11-24 NOTE — Telephone Encounter (Signed)
     Patient  visit on 3/29  at [facility]   Have you been able to follow up with your primary care physician? Yes   The patient was or was not able to obtain any needed medicine or equipment. Yes   Are there diet recommendations that you are having difficulty following? Na    Patient expresses understanding of discharge instructions and education provided has no other needs at this time.  Yes       [facility] [phone] [address], [place], [place] [address] Phone: [phone] Email: [email or web address]

## 2022-12-01 ENCOUNTER — Ambulatory Visit: Payer: Self-pay

## 2022-12-02 NOTE — Patient Outreach (Signed)
  Care Coordination   Follow Up Visit Note   12/01/2022 Name: Taylor Myers MRN: [id number] DOB: 1951/01/05  Taylor Myers is a 72 y.o. year old male who sees Tisovec, Adelfa Koh, MD for primary care. I spoke with  Taylor Myers by phone today.  What matters to the patients health and wellness today?  Patient will use precautionary measures to help avoid falls including limiting alcohol consumption on stressful days.     Goals Addressed               This Visit's Progress     Patient Stated     I have to have eye surgery (pt-stated)        Care Coordination Interventions: Evaluation of current treatment plan related to cataract extraction and patient's adherence to plan as established by provider Determined patient is scheduled for right Cataract extraction on May 2nd at the [facility]  Confirmed patient has transportation and caregiver assistance lined up  Discussed and reviewed patient's pre-operative appointment, patient verbalizes understanding of his prescribed treatment plan        Other     To avoid having more falls        Care Coordination Interventions: Provided written and verbal education re: potential causes of falls and Fall prevention strategies Advised patient of importance of notifying provider of falls Assessed for falls since last encounter Assessed patients knowledge of fall risk prevention secondary to previously provided education Determined patient experienced a recent fall resulting in a minor subarachnoid hemorrhage  Reviewed and discussed post discharge instructions with patient and determined patient completed his post ED f/u with PCP  Discussed cause of fall, patient admits he had drank a few cocktails following a stressful day Active listening / Reflection utilized  Emotional Support Provided Educated patient regarding LCSW availability for counseling, patient declines at this time Instructed patient to keep  his doctor well informed of new or worsening symptoms related to stress and or depression    Interventions Today    Flowsheet Row Most Recent Value  Chronic Disease   Chronic disease during today's visit Other  [Falls, Cataracts, subarachnoid hemorrhage]  General Interventions   General Interventions Discussed/Reviewed General Interventions Discussed, General Interventions Reviewed, Doctor Visits  Doctor Visits Discussed/Reviewed Doctor Visits Discussed, Doctor Visits Reviewed, PCP  Education Interventions   Education Provided Provided Education  Provided Verbal Education On Nutrition, When to see the doctor  Mental Health Interventions   Mental Health Discussed/Reviewed Mental Health Discussed, Mental Health Reviewed, Coping Strategies, Depression  [Patient declines LCSW referral at this time]  Nutrition Interventions   Nutrition Discussed/Reviewed Nutrition Discussed, Nutrition Reviewed, Fluid intake  Safety Interventions   Safety Discussed/Reviewed Safety Discussed, Safety Reviewed, Fall Risk, Home Safety  Home Safety Assistive Devices          SDOH assessments and interventions completed:  No     Care Coordination Interventions:  Yes, provided   Follow up plan: Follow up call scheduled for 12/30/22 @11 :30 AM    Encounter Outcome:  Pt. Visit Completed

## 2022-12-11 ENCOUNTER — Emergency Department (HOSPITAL_COMMUNITY)
Admission: EM | Admit: 2022-12-11 | Discharge: 2022-12-11 | Disposition: A | Discharge status: Home / Self Care | Payer: Medicare Other | Attending: Emergency Medicine | Admitting: Emergency Medicine

## 2022-12-11 ENCOUNTER — Emergency Department (HOSPITAL_COMMUNITY): Payer: Medicare Other

## 2022-12-11 DIAGNOSIS — I6782 Cerebral ischemia: Secondary | ICD-10-CM | POA: Insufficient documentation

## 2022-12-11 DIAGNOSIS — Y908 Blood alcohol level of 240 mg/100 ml or more: Secondary | ICD-10-CM | POA: Insufficient documentation

## 2022-12-11 DIAGNOSIS — W19XXXA Unspecified fall, initial encounter: Secondary | ICD-10-CM | POA: Insufficient documentation

## 2022-12-11 DIAGNOSIS — M542 Cervicalgia: Secondary | ICD-10-CM | POA: Insufficient documentation

## 2022-12-11 DIAGNOSIS — Y92019 Unspecified place in single-family (private) house as the place of occurrence of the external cause: Secondary | ICD-10-CM | POA: Insufficient documentation

## 2022-12-11 DIAGNOSIS — Z8673 Personal history of transient ischemic attack (TIA), and cerebral infarction without residual deficits: Secondary | ICD-10-CM | POA: Insufficient documentation

## 2022-12-11 DIAGNOSIS — Z7901 Long term (current) use of anticoagulants: Secondary | ICD-10-CM | POA: Insufficient documentation

## 2022-12-11 DIAGNOSIS — F10929 Alcohol use, unspecified with intoxication, unspecified: Secondary | ICD-10-CM

## 2022-12-11 DIAGNOSIS — F10220 Alcohol dependence with intoxication, uncomplicated: Secondary | ICD-10-CM | POA: Diagnosis not present

## 2022-12-11 LAB — URINALYSIS, ROUTINE W REFLEX MICROSCOPIC
Bacteria, UA: NONE SEEN
Bilirubin Urine: NEGATIVE
Glucose, UA: NEGATIVE mg/dL
Ketones, ur: NEGATIVE mg/dL
Nitrite: NEGATIVE
Protein, ur: 30 mg/dL — AB
Specific Gravity, Urine: 1.01 (ref 1.005–1.030)
pH: 5 (ref 5.0–8.0)

## 2022-12-11 LAB — COMPREHENSIVE METABOLIC PANEL
ALT: 17 U/L (ref 0–44)
AST: 31 U/L (ref 15–41)
Albumin: 3.8 g/dL (ref 3.5–5.0)
Alkaline Phosphatase: 63 U/L (ref 38–126)
Anion gap: 15 (ref 5–15)
BUN: 16 mg/dL (ref 8–23)
CO2: 26 mmol/L (ref 22–32)
Calcium: 8.7 mg/dL — ABNORMAL LOW (ref 8.9–10.3)
Chloride: 102 mmol/L (ref 98–111)
Creatinine, Ser: 0.74 mg/dL (ref 0.61–1.24)
GFR, Estimated: >60 mL/min (ref >60)
Glucose, Bld: 89 mg/dL (ref 70–99)
Potassium: 4.2 mmol/L (ref 3.5–5.1)
Sodium: 143 mmol/L (ref 135–145)
Total Bilirubin: 0.8 mg/dL (ref 0.3–1.2)
Total Protein: 7 g/dL (ref 6.5–8.1)

## 2022-12-11 LAB — I-STAT CHEM 8, ED
BUN: 21 mg/dL (ref 8–23)
Calcium, Ion: 0.9 mmol/L — ABNORMAL LOW (ref 1.15–1.40)
Chloride: 104 mmol/L (ref 98–111)
Creatinine, Ser: 1.1 mg/dL (ref 0.61–1.24)
Glucose, Bld: 86 mg/dL (ref 70–99)
HCT: 45 % (ref 39.0–52.0)
Hemoglobin: 15.3 g/dL (ref 13.0–17.0)
Potassium: 4.1 mmol/L (ref 3.5–5.1)
Sodium: 143 mmol/L (ref 135–145)
TCO2: 29 mmol/L (ref 22–32)

## 2022-12-11 LAB — CBC
HCT: 41.1 % (ref 39.0–52.0)
Hemoglobin: 14.4 g/dL (ref 13.0–17.0)
MCH: 34.4 pg — ABNORMAL HIGH (ref 26.0–34.0)
MCHC: 35 g/dL (ref 30.0–36.0)
MCV: 98.3 fL (ref 80.0–100.0)
Platelets: 211 10*3/uL (ref 150–400)
RBC: 4.18 MIL/uL — ABNORMAL LOW (ref 4.22–5.81)
RDW: 13.4 % (ref 11.5–15.5)
WBC: 4.9 10*3/uL (ref 4.0–10.5)
nRBC: 0 % (ref 0.0–0.2)

## 2022-12-11 LAB — SAMPLE TO BLOOD BANK

## 2022-12-11 LAB — RAPID URINE DRUG SCREEN, HOSP PERFORMED
Amphetamines: NOT DETECTED
Barbiturates: NOT DETECTED
Benzodiazepines: NOT DETECTED
Cocaine: NOT DETECTED
Opiates: NOT DETECTED
Tetrahydrocannabinol: NOT DETECTED

## 2022-12-11 LAB — ETHANOL: Alcohol, Ethyl (B): 436 mg/dL (ref <10)

## 2022-12-11 LAB — APTT: aPTT: 30 seconds (ref 24–36)

## 2022-12-11 LAB — MAGNESIUM: Magnesium: 1.7 mg/dL (ref 1.7–2.4)

## 2022-12-11 LAB — PROTIME-INR
INR: 1 (ref 0.8–1.2)
Prothrombin Time: 13.3 seconds (ref 11.4–15.2)

## 2022-12-11 MED ORDER — LORAZEPAM 1 MG PO TABS: 1.0000 mg | ORAL_TABLET | ORAL | Status: DC | PRN

## 2022-12-11 NOTE — ED Notes (Signed)
[facility] called  

## 2022-12-11 NOTE — Progress Notes (Signed)
Orthopedic Tech Progress Note Patient Details:  Taylor Myers 1950/11/10 [id number] Level 2 Trauma  Patient ID: [name], male   DOB: 1951/07/04, 72 y.o.   MRN: [id number]  [name] 12/11/2022, 11:48 AM

## 2022-12-11 NOTE — ED Triage Notes (Signed)
Pt BIB [facility] from home after unwitnessed fall. Patient's father heard a thump and called, patient seems to be intoxicated and father reports that he has been drinking. Unknown thinner status as patient was instructed to not take it after last admission. Patient alert and oriented to self and place but not situation. Placed in C-collar for precautions.

## 2022-12-11 NOTE — ED Notes (Signed)
Trauma Response Nurse Documentation   Taylor Myers is a 72 y.o. male arriving to [facility] ED via EMS  On Eliquis (apixaban) daily. Trauma was activated as a Level 2 by ED Charge RN based on the following trauma criteria GCS 10-14 associated with trauma or AVPU < A. Trauma team at the bedside on patient arrival.   Patient cleared for CT by Dr. Eloise Harman. Pt transported to CT with trauma response nurse present to monitor. RN remained with the patient throughout their absence from the department for clinical observation.   GCS 14.  History   Past Medical History:  Diagnosis Date   A-fib (HCC)    Alcohol abuse, in remission    Arthritis    Asthma    child hood asthma   History of kidney stones    "dormant kidney stone"   Hypertension    Wheelchair dependent      Past Surgical History:  Procedure Laterality Date   ANKLE SURGERY Right    APPLICATION OF WOUND VAC Right 03/19/2022   Procedure: APPLICATION OF WOUND VAC;  Surgeon: Tarry Kos, MD;  Location: [facility] OR;  Service: Orthopedics;  Laterality: Right;   HIP SURGERY Right    total replacement   KNEE SURGERY     bilateral replacements   ORIF HUMERUS FRACTURE Right 03/19/2022   Procedure: OPEN REDUCTION INTERNAL FIXATION (ORIF) RIGHT DISTAL HUMERUS FRACTURE;  Surgeon: Tarry Kos, MD;  Location: [facility] OR;  Service: Orthopedics;  Laterality: Right;      Initial Focused Assessment (If applicable, or please see trauma documentation): - GCS 14 - Pt very intoxicated and in his underwear  CT's Completed:   CT Head and CT C-Spine   Interventions:  - 20G PIV to L FA - Labs drawn - CTs done  Plan for disposition:  Other Probable discharge  Consults completed:  none at 1300.  Event Summary: Pt lives at home with his 21 y.o father.  Pt admits to drinking and states that he doesn't remember falling however his father heard a thump.    Bedside handoff with ED RN Taylor Myers.    Taylor Myers  Trauma Response RN  Please call TRN  at [phone] for further assistance.

## 2022-12-11 NOTE — ED Provider Notes (Signed)
[place] EMERGENCY DEPARTMENT AT [facility] Provider Note   CSN: [id number] Arrival date & time: 12/11/22  1134     History  Chief Complaint  Patient presents with   Taylor Myers is a 72 y.o. male.  72 year old male with a history of alcohol abuse and frequent falls as well as atrial fibrillation possibly on Eliquis who presented to the emergency department after a fall.  Patient had a fall at home that was unwitnessed.  His father who he lives with heard a thump and called 911 when he was found on the ground.  Patient does believe that he hit his head.  Says he is having neck pain as well but denies pain elsewhere.  Does not recall the events that led up to the fall.  Per EMS the father reported that he was drinking prior to arrival.  Was seen in the emergency department on 3/28 for a fall placed and was found to have a traumatic SAH. Cleared by NSGY and sent home with instructions to stop his eliquis but he is unsure if he has been taking it.       Home Medications Prior to Admission medications   Medication Sig Start Date End Date Taking? Authorizing Provider  ALEVE 220 MG tablet Take 220-440 mg by mouth 2 (two) times daily as needed (for pain). Patient not taking: Reported on 10/12/2022    [provider]  allopurinol (ZYLOPRIM) 300 MG tablet Take 300 mg by mouth daily. 04/18/21   [provider]  amLODipine (NORVASC) 5 MG tablet Take 5 mg by mouth daily. 03/18/21   [provider]  atorvastatin (LIPITOR) 40 MG tablet Take 40 mg by mouth daily. 03/18/21   [provider]  CLEAR EYES PURE RELIEF PF 0.25 % SOLN Place 1 drop into both eyes See admin instructions. Instill 1 drop into both eyes two to three times a day as needed for irritation    [provider]  DHA-EPA-Flaxseed Oil-Vitamin E (THERATEARS NUTRITION) CAPS Take 1 capsule by mouth daily.    [provider]  erythromycin ophthalmic ointment Place 1  Application into the right eye 2 (two) times daily. 01/04/22   [provider]  Menthol-Camphor (TIGER BALM EXTRA STRENGTH) 11-10 % OINT Apply 1 application  topically See admin instructions. Apply to affected hips, shoulders, joints, or other painful sites daily as needed for discomfort    [provider]  methocarbamol (ROBAXIN) 500 MG tablet Take 1 tablet (500 mg total) by mouth 2 (two) times daily as needed. Patient taking differently: Take 500 mg by mouth 2 (two) times daily as needed for muscle spasms. 03/15/22   Cristie Hem, PA-C  Multiple Vitamins-Minerals (LIVER DETOX PO) Take 2 tablets by mouth daily.    [provider]  NON FORMULARY Take 2 capsules by mouth See admin instructions. Beet extract capsules- Take 2 capsules by mouth once a day    [provider]  ondansetron (ZOFRAN) 4 MG tablet Take 1 tablet (4 mg total) by mouth every 8 (eight) hours as needed for nausea or vomiting. 03/15/22   Cristie Hem, PA-C  OVER THE COUNTER MEDICATION Take 1 tablet by mouth 2 (two) times daily. Beet extract    [provider]  oxybutynin (DITROPAN) 5 MG tablet Take 5 mg by mouth daily. 04/18/21   [provider]  oxyCODONE-acetaminophen (PERCOCET) 5-325 MG tablet Take 1-2 tablets by mouth every 6 (six) hours as needed for  severe pain. 03/22/22 03/22/23  Cristie Hem, PA-C  PRESCRIPTION MEDICATION Place 1 drop into the right eye See admin instructions. Autologous serum eye drops- Instill 1 drop into the right eye 2 times a day    [provider]  tamsulosin (FLOMAX) 0.4 MG CAPS capsule Take 0.4 mg by mouth daily.    [provider]  TURMERIC PO Take 1 capsule by mouth daily.    [provider]  TYLENOL 8 HOUR ARTHRITIS PAIN 650 MG CR tablet Take 650-1,300 mg by mouth every 8 (eight) hours as needed for pain.    [provider]  venlafaxine XR (EFFEXOR-XR) 150 MG 24 hr capsule Take 150 mg by mouth daily.  04/18/21   [provider]  Vitamin D, Ergocalciferol, (DRISDOL) 1.25 MG (50000 UNIT) CAPS capsule Take 50,000 Units by mouth every Monday.    [provider]      Allergies    Patient has no known allergies.    Review of Systems   Review of Systems  Physical Exam Updated Vital Signs BP 128/77   Pulse 65   Temp 98 F (36.7 C) (Oral)   Resp 16   SpO2 96%  Physical Exam Vitals and nursing note reviewed.  Constitutional:      General: He is not in acute distress.    Appearance: Normal appearance. He is well-developed. He is not ill-appearing.  HENT:     Head: Normocephalic.     Comments: No obvious head trauma noted    Right Ear: External ear normal.     Left Ear: External ear normal.     Nose: Nose normal.     Mouth/Throat:     Mouth: Mucous membranes are moist.     Pharynx: Oropharynx is clear.  Eyes:     Extraocular Movements: Extraocular movements intact.     Conjunctiva/sclera: Conjunctivae normal.     Pupils: Pupils are equal, round, and reactive to light.     Comments: Pupils 3 mm bilaterally.  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     Pulses: Normal pulses.     Heart sounds: Normal heart sounds.  Pulmonary:     Effort: Pulmonary effort is normal. No respiratory distress.     Breath sounds: Normal breath sounds.  Abdominal:     General: Abdomen is flat. Bowel sounds are normal. There is no distension.     Palpations: Abdomen is soft. There is no mass.     Tenderness: There is no abdominal tenderness. There is no guarding.  Musculoskeletal:        General: No deformity. Normal range of motion.     Right lower leg: No edema.     Left lower leg: No edema.     Comments: No tenderness to palpation of midline thoracic or lumbar spine.  No step-offs palpated.  No tenderness to palpation of chest wall.  No bruising noted.  No tenderness to palpation of  bilateral clavicles.  No tenderness to palpation, bruising, or deformities noted of bilateral shoulders, elbows, wrists, hips, knees, or ankles.  Skin:    General: Skin is warm and dry.  Neurological:     General: No focal deficit present.     Mental Status: He is alert. Mental status is at baseline.     Cranial Nerves: No cranial nerve deficit.     Sensory: No sensory deficit.  Motor: No weakness.  Psychiatric:        Mood and Affect: Mood normal.        Behavior: Behavior normal.     ED Results / Procedures / Treatments   Labs (all labs ordered are listed, but only abnormal results are displayed) Labs Reviewed  COMPREHENSIVE METABOLIC PANEL - Abnormal; Notable for the following components:      Result Value   Calcium 8.7 (*)    All other components within normal limits  CBC - Abnormal; Notable for the following components:   RBC 4.18 (*)    MCH 34.4 (*)    All other components within normal limits  ETHANOL - Abnormal; Notable for the following components:   Alcohol, Ethyl (B) 436 (*)    All other components within normal limits  URINALYSIS, ROUTINE W REFLEX MICROSCOPIC - Abnormal; Notable for the following components:   Hgb urine dipstick SMALL (*)    Protein, ur 30 (*)    Leukocytes,Ua TRACE (*)    All other components within normal limits  I-STAT CHEM 8, ED - Abnormal; Notable for the following components:   Calcium, Ion 0.90 (*)    All other components within normal limits  PROTIME-INR  APTT  RAPID URINE DRUG SCREEN, HOSP PERFORMED  MAGNESIUM  SAMPLE TO BLOOD BANK    EKG EKG Interpretation  Date/Time:  Saturday December 11 2022 13:03:47 EDT Ventricular Rate:  73 PR Interval:    QRS Duration: 138 QT Interval:  426 QTC Calculation: 469 R Axis:   -89 Text Interpretation: Atrial fibrillation with premature ventricular or aberrantly conducted complexes Left axis deviation Right bundle branch block Abnormal ECG When compared with ECG of 18-Nov-2022 22:21, No  significant change since last tracing Confirmed by Vonita Moss [phone]) on 12/11/2022 5:34:26 PM  Radiology CT Head Wo Contrast  Result Date: 12/11/2022 CLINICAL DATA:  Fall EXAM: CT HEAD WITHOUT CONTRAST CT CERVICAL SPINE WITHOUT CONTRAST TECHNIQUE: Multidetector CT imaging of the head and cervical spine was performed following the standard protocol without intravenous contrast. Multiplanar CT image reconstructions of the cervical spine were also generated. RADIATION DOSE REDUCTION: This exam was performed according to the departmental dose-optimization program which includes automated exposure control, adjustment of the mA and/or kV according to patient size and/or use of iterative reconstruction technique. COMPARISON:  CT head 11/19/22, CT C spine 11/18/22 FINDINGS: CT HEAD FINDINGS Brain: No evidence of acute infarction, hemorrhage, hydrocephalus, extra-axial collection or mass lesion. Unchanged 6 mm rightward midline shift redemonstrated is a chronic left cerebellar infarct. There is sequela mild overall chronic microvascular ischemic change. Interval resolution of previously seen blood products of the medial left temporal lobe. Vascular: No hyperdense vessel or unexpected calcification. Likely postsurgical changes from a microvascular decompression. Skull: Postsurgical changes from a retromastoid craniotomy. Sinuses/Orbits: Mild mucosal thickening in the bilateral maxillary and ethmoid sinuses. Orbits are unremarkable. No middle ear or mastoid effusion. Other: None. CT CERVICAL SPINE FINDINGS Alignment: Trace retrolisthesis of C3 on C4. Grade 1 anterolisthesis of C4 on C5 and C7 on T1. Skull base and vertebrae: No acute fracture. No primary bone lesion or focal pathologic process. 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 Soft tissues and spinal canal: No prevertebral fluid or swelling. No visible canal hematoma. 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 Upper chest: Negative. Other: None IMPRESSION: 1. No acute intracranial abnormality. 2. No acute fracture or traumatic malalignment of the cervical spine. 3. 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 Electronically Signed   By: Lorenza Cambridge M.D.   On: 12/11/2022 12:19   CT Cervical Spine Wo Contrast  Result Date: 12/11/2022 CLINICAL DATA:  Fall EXAM: CT HEAD WITHOUT CONTRAST CT CERVICAL SPINE WITHOUT CONTRAST TECHNIQUE: Multidetector CT imaging of the head and cervical spine was performed following the standard protocol without intravenous contrast. Multiplanar CT image reconstructions of the cervical spine were also generated. RADIATION DOSE REDUCTION: This exam was performed according to the departmental dose-optimization program which includes automated exposure control, adjustment of the mA and/or kV according to patient size and/or use of iterative reconstruction technique. COMPARISON:  CT head 11/19/22, CT C spine 11/18/22 FINDINGS: CT HEAD FINDINGS Brain: No evidence of acute infarction, hemorrhage, hydrocephalus, extra-axial collection or mass lesion. Unchanged 6 mm rightward midline shift redemonstrated is a chronic left cerebellar infarct. There is sequela mild overall chronic microvascular ischemic change. Interval resolution of previously seen blood products of the medial left temporal lobe. Vascular: No hyperdense vessel or unexpected calcification. Likely postsurgical changes from a microvascular decompression. Skull: Postsurgical changes from a retromastoid craniotomy. Sinuses/Orbits: Mild mucosal thickening in the bilateral maxillary and ethmoid sinuses. Orbits are unremarkable. No middle ear or mastoid effusion. Other: None. CT CERVICAL SPINE FINDINGS Alignment: Trace retrolisthesis of C3 on C4. Grade 1 anterolisthesis of C4 on C5 and C7 on T1. Skull base and vertebrae: No acute fracture. No primary bone lesion or focal pathologic process. There osseous  findings of DISH multilevel bridging anterior osteophytes. Soft tissues and spinal canal: No prevertebral fluid or swelling. No visible canal hematoma. 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 Upper chest: Negative. Other: None IMPRESSION: 1. No acute intracranial abnormality. 2. No acute fracture or traumatic malalignment of the cervical spine. 3. 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 Electronically Signed   By: Lorenza Cambridge M.D.   On: 12/11/2022 12:19    Procedures Procedures   Medications Ordered in ED Medications  LORazepam (ATIVAN) tablet 1-4 mg (has no administration in time range)    ED Course/ Medical Decision Making/ A&P                             Medical Decision Making Amount and/or Complexity of Data Reviewed Labs: ordered. Radiology: ordered.  Risk Prescription drug management.   Taylor Myers is a 72 y.o. male with comorbidities that complicate the patient evaluation including with a history of alcohol abuse, frequent falls with wheelchair dependency, and atrial fibrillation on Eliquis who presented to the emergency department after a fall  Initial Ddx:  Alcohol intoxication, TBI, C-spine injury, syncope  MDM:  The patient may be intoxicated given his exam at this time.  Will obtain CT of the head and C-spine to evaluate for injury.  Will obtain EKG as well in case of a syncopal event but feel this is less likely.  Plan:  Labs Ethanol level Urinalysis Urine drug screen CT head CT C-spine  ED Summary/Re-evaluation:  Patient remained in the emergency department for several hours and started to become more sober.  Was able to state that he tried getting out of his wheelchair and fell.  Has had several visits emergency department for falls recently so did offer him PT and OT evaluation but he declined it at this time.  Do feel that he has capacity to do so since he is now  clinically sober.  No traumatic  injuries were found.  Patient waiting transport back to his home at this time.  Is placed on CIWA in the meantime.  This patient presents to the ED for concern of complaints listed in HPI, this involves an extensive number of treatment options, and is a complaint that carries with it a high risk of complications and morbidity. Disposition including potential need for admission considered.   Dispo: DC Home. Return precautions discussed including, but not limited to, those listed in the AVS. Allowed pt time to ask questions which were answered fully prior to dc.  Additional history obtained from EMS Records reviewed Outpatient Clinic Notes The following labs were independently interpreted: Chemistry and show no acute abnormality I independently reviewed the following imaging with scope of interpretation limited to determining acute life threatening conditions related to emergency care: CT Head and agree with the radiologist interpretation with the following exceptions: none I personally reviewed and interpreted the pt's EKG: see above for interpretation  I have reviewed the patients home medications and made adjustments as needed Social Determinants of health:  ETOH abuse, wheelchair dependent  Final Clinical Impression(s) / ED Diagnoses Final diagnoses:  Fall, initial encounter  Alcoholic intoxication with complication    Rx / DC Orders ED Discharge Orders     None         Rondel Baton, MD 12/11/22 2230

## 2022-12-11 NOTE — ED Notes (Signed)
Call father, Child Campoy when Sharin Mons gets here to unlock house.  [phone]

## 2022-12-11 NOTE — Discharge Instructions (Signed)
You were seen for your fall in the emergency department.   At home, please stop taking the Eliquis until you talk to your primary doctor about when it is safe to resume since you had a brain bleed.  Check your MyChart online for the results of any tests that had not resulted by the time you left the emergency department.   Follow-up with your primary doctor in 2-3 days regarding your visit.    Return immediately to the emergency department if you experience any of the following: Severe headache, vomiting, or any other concerning symptoms.    Thank you for visiting our Emergency Department. It was a pleasure taking care of you today.

## 2022-12-11 NOTE — ED Notes (Signed)
Patient awake and alert, provided a sandwich and drink.

## 2022-12-14 ENCOUNTER — Telehealth: Payer: Self-pay | Admitting: *Deleted

## 2022-12-14 NOTE — Telephone Encounter (Signed)
        Patient  visited [facility] ed on 12/11/2022  for trestment   Telephone encounter attempt :  1st  A HIPAA compliant voice message was left requesting a return call.  Instructed patient to call back at [phone].  Yehuda Mao Greenauer -[facility] [place], Population Health [phone] [address] [place] , [place] [address] Email : [email or web address]

## 2022-12-15 ENCOUNTER — Telehealth: Payer: Self-pay | Admitting: *Deleted

## 2022-12-15 NOTE — Telephone Encounter (Signed)
        Patient  visited [facility] ed on 12/11/2022  for treatment   Telephone encounter attempt :  2nd  A HIPAA compliant voice message was left requesting a return call.  Instructed patient to call back at [phone].  Taylor Myers [facility] [place], Population Health [phone] [address] , [place] [address] Email : [name]       

## 2022-12-30 ENCOUNTER — Encounter (HOSPITAL_COMMUNITY): Payer: Self-pay

## 2022-12-30 ENCOUNTER — Ambulatory Visit: Payer: Self-pay

## 2022-12-30 ENCOUNTER — Emergency Department (HOSPITAL_COMMUNITY)
Admission: EM | Admit: 2022-12-30 | Discharge: 2022-12-30 | Discharge status: Against Medical Advice | Payer: Medicare Other | Attending: Emergency Medicine | Admitting: Emergency Medicine

## 2022-12-30 ENCOUNTER — Emergency Department (HOSPITAL_COMMUNITY): Payer: Medicare Other

## 2022-12-30 ENCOUNTER — Other Ambulatory Visit: Payer: Self-pay

## 2022-12-30 DIAGNOSIS — Z5321 Procedure and treatment not carried out due to patient leaving prior to being seen by health care provider: Secondary | ICD-10-CM | POA: Diagnosis not present

## 2022-12-30 DIAGNOSIS — M545 Low back pain, unspecified: Secondary | ICD-10-CM | POA: Insufficient documentation

## 2022-12-30 LAB — BASIC METABOLIC PANEL
Anion gap: 13 (ref 5–15)
BUN: 9 mg/dL (ref 8–23)
CO2: 30 mmol/L (ref 22–32)
Calcium: 8.6 mg/dL — ABNORMAL LOW (ref 8.9–10.3)
Chloride: 103 mmol/L (ref 98–111)
Creatinine, Ser: 0.76 mg/dL (ref 0.61–1.24)
GFR, Estimated: >60 mL/min (ref >60)
Glucose, Bld: 96 mg/dL (ref 70–99)
Potassium: 3.5 mmol/L (ref 3.5–5.1)
Sodium: 146 mmol/L — ABNORMAL HIGH (ref 135–145)

## 2022-12-30 LAB — CBC
HCT: 40.1 % (ref 39.0–52.0)
Hemoglobin: 13.7 g/dL (ref 13.0–17.0)
MCH: 33.7 pg (ref 26.0–34.0)
MCHC: 34.2 g/dL (ref 30.0–36.0)
MCV: 98.5 fL (ref 80.0–100.0)
Platelets: 211 10*3/uL (ref 150–400)
RBC: 4.07 MIL/uL — ABNORMAL LOW (ref 4.22–5.81)
RDW: 13.6 % (ref 11.5–15.5)
WBC: 5.9 10*3/uL (ref 4.0–10.5)
nRBC: 0 % (ref 0.0–0.2)

## 2022-12-30 NOTE — ED Provider Triage Note (Signed)
Emergency Medicine Provider Triage Evaluation Note  Taylor Myers , a 72 y.o. male  was evaluated in triage.  Pt complains of +low back pain for 2 days. Denies trauma but does report doing a lot of sit ups and push ups recently. No loss of bowel or bladder control, chronic issues with BPH unchanged. No radiation into legs or asociated numbness or weakness.   Review of Systems  Positive: Back pain Negative: Numbness, weakness  Physical Exam  BP (!) 155/86 (BP Location: Left Arm)   Pulse 86   Temp 98.9 F (37.2 C) (Oral)   Resp 18   Ht 5\' 9"  (1.753 m)   Wt 72.6 kg   SpO2 96%   BMI 23.64 kg/m  Gen:   Awake, no distress   Resp:  Normal effort  MSK:   Moves extremities without difficulty, 5/5 strength in BLE Other:  Back NTTP  Medical Decision Making  Medically screening exam initiated at 1:21 PM.  Appropriate orders placed.  Taylor Myers was informed that the remainder of the evaluation will be completed by another provider, this initial triage assessment does not replace that evaluation, and the importance of remaining in the ED until their evaluation is complete.     Taylor Myers, [place] 12/30/22 1333

## 2022-12-30 NOTE — ED Triage Notes (Signed)
Pt arrived via [facility] from home for lower back painx2d. Pt denies numbness and tingling. Pt states has been having urinary incontinencex2d.

## 2022-12-30 NOTE — Patient Outreach (Signed)
  Care Coordination   12/30/2022 Name: Taylor Myers MRN: [id number] DOB: [birth year]   Care Coordination Outreach Attempts:  An unsuccessful telephone outreach was attempted for a scheduled appointment today.  Follow Up Plan:  Additional outreach attempts will be made to offer the patient care coordination information and services.   Encounter Outcome:  No Answer   Care Coordination Interventions:  No, not indicated    Delsa Sale, RN, BSN, CCM Care Management Coordinator [facility] Care Management  Direct Phone: [phone]

## 2023-01-07 ENCOUNTER — Emergency Department (HOSPITAL_COMMUNITY): Payer: Medicare Other

## 2023-01-07 ENCOUNTER — Emergency Department (HOSPITAL_COMMUNITY)
Admission: EM | Admit: 2023-01-07 | Discharge: 2023-01-07 | Disposition: A | Discharge status: Home / Self Care | Payer: Medicare Other | Attending: Emergency Medicine | Admitting: Emergency Medicine

## 2023-01-07 ENCOUNTER — Encounter (HOSPITAL_COMMUNITY): Payer: Self-pay

## 2023-01-07 ENCOUNTER — Other Ambulatory Visit: Payer: Self-pay

## 2023-01-07 DIAGNOSIS — R519 Headache, unspecified: Secondary | ICD-10-CM | POA: Insufficient documentation

## 2023-01-07 DIAGNOSIS — Z79899 Other long term (current) drug therapy: Secondary | ICD-10-CM | POA: Diagnosis not present

## 2023-01-07 DIAGNOSIS — Z7901 Long term (current) use of anticoagulants: Secondary | ICD-10-CM | POA: Diagnosis not present

## 2023-01-07 DIAGNOSIS — M542 Cervicalgia: Secondary | ICD-10-CM | POA: Insufficient documentation

## 2023-01-07 DIAGNOSIS — I10 Essential (primary) hypertension: Secondary | ICD-10-CM | POA: Insufficient documentation

## 2023-01-07 DIAGNOSIS — W182XXA Fall in (into) shower or empty bathtub, initial encounter: Secondary | ICD-10-CM | POA: Diagnosis not present

## 2023-01-07 DIAGNOSIS — W19XXXA Unspecified fall, initial encounter: Secondary | ICD-10-CM

## 2023-01-07 DIAGNOSIS — M545 Low back pain, unspecified: Secondary | ICD-10-CM | POA: Insufficient documentation

## 2023-01-07 MED ORDER — OXYCODONE HCL 5 MG PO TABS: 5.0000 mg | ORAL_TABLET | Freq: Four times a day (QID) | ORAL | 0 refills | Status: DC | PRN

## 2023-01-07 MED ORDER — OXYCODONE HCL 5 MG PO TABS
5.0000 mg | ORAL_TABLET | Freq: Once | ORAL | Status: AC
  Administered 2023-01-07: 5 mg via ORAL
  Filled 2023-01-07: qty 1

## 2023-01-07 MED ORDER — CYCLOBENZAPRINE HCL 5 MG PO TABS: 5.0000 mg | ORAL_TABLET | Freq: Two times a day (BID) | ORAL | 0 refills | Status: DC | PRN

## 2023-01-07 NOTE — Discharge Instructions (Signed)
I have written you for oxycodone for breakthrough pain.  Written for Flexeril for muscle relaxants.  These medications are sedating.  Do not mix with alcohol or drugs or dangerous activities including driving.  Follow-up with your primary care doctor and your eye doctor.

## 2023-01-07 NOTE — ED Provider Notes (Signed)
[place] EMERGENCY DEPARTMENT AT [facility] Provider Note   CSN: [id number] Arrival date & time: 01/07/23  1137     History  Chief Complaint  Patient presents with   Eye Problem   Fall    Taylor Myers is a 72 y.o. male.  Patient here primary for fall.  He is having headache, neck pain and low back pain after a fall out of his shower chair yesterday.  Does not think he hit his head.  He is wheelchair-bound.  Had a hard time getting back into his wheelchair.  Patient does have atrial fibrillation.  Patient is on Eliquis.  The history is provided by the patient.       Home Medications Prior to Admission medications   Medication Sig Start Date End Date Taking? Authorizing Provider  cyclobenzaprine (FLEXERIL) 5 MG tablet Take 1 tablet (5 mg total) by mouth 2 (two) times daily as needed for up to 20 doses for muscle spasms. 01/07/23  Yes Davie Claud, DO  oxyCODONE (ROXICODONE) 5 MG immediate release tablet Take 1 tablet (5 mg total) by mouth every 6 (six) hours as needed for up to 10 doses for breakthrough pain. 01/07/23  Yes Shenia Alan, DO  ALEVE 220 MG tablet Take 220-440 mg by mouth 2 (two) times daily as needed (for pain). Patient not taking: Reported on 10/12/2022    [provider]  allopurinol (ZYLOPRIM) 300 MG tablet Take 300 mg by mouth daily. 04/18/21   [provider]  amLODipine (NORVASC) 5 MG tablet Take 5 mg by mouth daily. 03/18/21   [provider]  atorvastatin (LIPITOR) 40 MG tablet Take 40 mg by mouth daily. 03/18/21   [provider]  CLEAR EYES PURE RELIEF PF 0.25 % SOLN Place 1 drop into both eyes See admin instructions. Instill 1 drop into both eyes two to three times a day as needed for irritation    [provider]  DHA-EPA-Flaxseed Oil-Vitamin E (THERATEARS NUTRITION) CAPS Take 1 capsule by mouth daily.    [provider]  erythromycin ophthalmic ointment Place 1 Application into the right eye  2 (two) times daily. 01/04/22   [provider]  Menthol-Camphor (TIGER BALM EXTRA STRENGTH) 11-10 % OINT Apply 1 application  topically See admin instructions. Apply to affected hips, shoulders, joints, or other painful sites daily as needed for discomfort    [provider]  methocarbamol (ROBAXIN) 500 MG tablet Take 1 tablet (500 mg total) by mouth 2 (two) times daily as needed. Patient taking differently: Take 500 mg by mouth 2 (two) times daily as needed for muscle spasms. 03/15/22   Cristie Hem, PA-C  Multiple Vitamins-Minerals (LIVER DETOX PO) Take 2 tablets by mouth daily.    [provider]  NON FORMULARY Take 2 capsules by mouth See admin instructions. Beet extract capsules- Take 2 capsules by mouth once a day    [provider]  ondansetron (ZOFRAN) 4 MG tablet Take 1 tablet (4 mg total) by mouth every 8 (eight) hours as needed for nausea or vomiting. 03/15/22   Cristie Hem, PA-C  OVER THE COUNTER MEDICATION Take 1 tablet by mouth 2 (two) times daily. Beet extract    [provider]  oxybutynin (DITROPAN) 5 MG tablet Take 5 mg by mouth daily. 04/18/21   [provider]  oxyCODONE-acetaminophen (PERCOCET) 5-325 MG tablet Take 1-2 tablets by mouth every 6 (six) hours as needed for severe pain. 03/22/22 03/22/23  Cristie Hem,  PA-C  PRESCRIPTION MEDICATION Place 1 drop into the right eye See admin instructions. Autologous serum eye drops- Instill 1 drop into the right eye 2 times a day    [provider]  tamsulosin (FLOMAX) 0.4 MG CAPS capsule Take 0.4 mg by mouth daily.    [provider]  TURMERIC PO Take 1 capsule by mouth daily.    [provider]  TYLENOL 8 HOUR ARTHRITIS PAIN 650 MG CR tablet Take 650-1,300 mg by mouth every 8 (eight) hours as needed for pain.    [provider]  venlafaxine XR (EFFEXOR-XR) 150 MG 24 hr capsule Take 150 mg by mouth daily. 04/18/21   [provider]   Vitamin D, Ergocalciferol, (DRISDOL) 1.25 MG (50000 UNIT) CAPS capsule Take 50,000 Units by mouth every Monday.    [provider]      Allergies    Patient has no known allergies.    Review of Systems   Review of Systems  Physical Exam Updated Vital Signs BP 136/87 (BP Location: Right Arm)   Pulse 69   Temp 98.9 F (37.2 C) (Oral)   Resp 20   SpO2 98%  Physical Exam  ED Results / Procedures / Treatments   Labs (all labs ordered are listed, but only abnormal results are displayed) Labs Reviewed - No data to display  EKG None  Radiology DG Hip Unilat With Pelvis 2-3 Views Right  Result Date: 01/07/2023 CLINICAL DATA:  pain EXAM: DG HIP (WITH OR WITHOUT PELVIS) 2-3V RIGHT COMPARISON:  AP pelvic radiograph 11/18/2022. FINDINGS: AP pelvis with AP and frog-leg lateral views of the right hip. The patient is status post right total hip arthroplasty. The hardware is intact without loosening. There is some heterotopic ossification surrounding the greater trochanter moderate left hip degenerative changes are noted. There are degenerative changes in the lower lumbar spine. Diffuse vascular calcifications are noted. IMPRESSION: Status post right total hip arthroplasty without demonstrated complication. Moderate left hip degenerative changes. Electronically Signed   By: Carey Bullocks M.D.   On: 01/07/2023 13:53   DG Lumbar Spine Complete  Result Date: 01/07/2023 CLINICAL DATA:  Hip pain, recent fall EXAM: LUMBAR SPINE - COMPLETE 4+ VIEW COMPARISON:  12/30/2022 FINDINGS: Chronic T11 anterior compression deformity. Chronic L4 and L5 compression deformity compression deformity. No evidence of acute lumbar spine fracture. There is multilevel degenerative disc disease, severe at L1-L2 and L2-L3, unchanged. There is moderate lower lumbar predominant facet arthropathy. Unchanged mild retrolisthesis at L1-L2, L2-L3, and L3-L4 and grade 1 anterolisthesis at L4-5 and L5-S1. Partially  visualized right hip arthroplasty hardware. Vascular calcifications. IMPRESSION: No evidence of acute lumbar spine fracture. Unchanged chronic compression deformities of T11, L4, and L5. Unchanged multilevel degenerative disc disease and facet arthropathy. Unchanged spinal alignment. Electronically Signed   By: Caprice Renshaw M.D.   On: 01/07/2023 13:25   CT Head Wo Contrast  Result Date: 01/07/2023 CLINICAL DATA:  Trauma EXAM: CT HEAD WITHOUT CONTRAST CT CERVICAL SPINE WITHOUT CONTRAST TECHNIQUE: Multidetector CT imaging of the head and cervical spine was performed following the standard protocol without intravenous contrast. Multiplanar CT image reconstructions of the cervical spine were also generated. RADIATION DOSE REDUCTION: This exam was performed according to the departmental dose-optimization program which includes automated exposure control, adjustment of the mA and/or kV according to patient size and/or use of iterative reconstruction technique. COMPARISON:  CT Head and C Spine 11/19/22 FINDINGS: CT HEAD FINDINGS Brain: No evidence of acute infarction, hemorrhage, hydrocephalus, extra-axial collection  or mass lesion/mass effect. Chronic left cerebellar infarct. Vascular: No hyperdense vessel or unexpected calcification. Atherosclerotic plaque at the V4 segment of the right vertebral artery. Skull: Right suboccipital craniotomy. Sinuses/Orbits: No middle ear or mastoid effusion. Paranasal sinuses are clear. Orbits are notable for right lens replacement, otherwise unremarkable. Other: None. CT CERVICAL SPINE FINDINGS Limitations: Motion degraded exam. Alignment: Grade 1 anterolisthesis of C7 on T1. Trace anterolisthesis of C2 on C3 and C4 on C5. Skull base and vertebrae: There are multilevel bridging osteophytes, compatible with DISH. Soft tissues and spinal canal: Injury to the right frontal scalp. No evidence of underlying calvarial fracture. 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  Disc levels:  No evidence of high-grade spinal canal stenosis. Upper chest: Negative. Other: None IMPRESSION: 1. No acute intracranial abnormality. 2. Soft tissue injury to the right frontal scalp. No evidence of underlying calvarial fracture. 3. Motion degraded cervical spine CT. No evidence of acute fracture or traumatic malalignment. 4. 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 Electronically Signed   By: Lorenza Cambridge M.D.   On: 01/07/2023 13:18   CT Cervical Spine Wo Contrast  Result Date: 01/07/2023 CLINICAL DATA:  Trauma EXAM: CT HEAD WITHOUT CONTRAST CT CERVICAL SPINE WITHOUT CONTRAST TECHNIQUE: Multidetector CT imaging of the head and cervical spine was performed following the standard protocol without intravenous contrast. Multiplanar CT image reconstructions of the cervical spine were also generated. RADIATION DOSE REDUCTION: This exam was performed according to the departmental dose-optimization program which includes automated exposure control, adjustment of the mA and/or kV according to patient size and/or use of iterative reconstruction technique. COMPARISON:  CT Head and C Spine 11/19/22 FINDINGS: CT HEAD FINDINGS Brain: No evidence of acute infarction, hemorrhage, hydrocephalus, extra-axial collection or mass lesion/mass effect. Chronic left cerebellar infarct. Vascular: No hyperdense vessel or unexpected calcification. Atherosclerotic plaque at the V4 segment of the right vertebral artery. Skull: Right suboccipital craniotomy. Sinuses/Orbits: No middle ear or mastoid effusion. Paranasal sinuses are clear. Orbits are notable for right lens replacement, otherwise unremarkable. Other: None. CT CERVICAL SPINE FINDINGS Limitations: Motion degraded exam. Alignment: Grade 1 anterolisthesis of C7 on T1. Trace anterolisthesis of C2 on C3 and C4 on C5. Skull base and vertebrae: There are multilevel bridging osteophytes, compatible with DISH. Soft tissues and spinal canal: Injury to the  right frontal scalp. No evidence of underlying calvarial fracture. 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 Disc levels:  No evidence of high-grade spinal canal stenosis. Upper chest: Negative. Other: None IMPRESSION: 1. No acute intracranial abnormality. 2. Soft tissue injury to the right frontal scalp. No evidence of underlying calvarial fracture. 3. Motion degraded cervical spine CT. No evidence of acute fracture or traumatic malalignment. 4. 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 Electronically Signed   By: Lorenza Cambridge M.D.   On: 01/07/2023 13:18    Procedures Procedures    Medications Ordered in ED Medications  oxyCODONE (Oxy IR/ROXICODONE) immediate release tablet 5 mg (5 mg Oral Given 01/07/23 1241)    ED Course/ Medical Decision Making/ A&P                             Medical Decision Making Amount and/or Complexity of Data Reviewed Radiology: ordered.  Risk Prescription drug management.   Derin Healan is here after fall yesterday.  Hypertension, arthritis, A-fib history on Eliquis.  His main concern is about  some headache, low back pain after fall yesterday.  He is currently being treated with some antibiotics for his right eye by his eye doctor.  He had recent eye surgery.  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  His hope is that the surgery might save some of his vision.  He is not actually that concerned about his eye.  It has been swollen now for a few months.  I do not see any obvious eye infection on exam.  Is not major swelling around the eye.  His extraocular movements are intact.  He is not having any eye pain.  I have no concern for orbital or periorbital cellulitis.  I strongly encourage close eye doctor follow-up.  Primarily will focus on his fall.  Will get head CT, neck CT and x-ray of his low back.  Will give him a dose of Roxicodone.  He is neurovascular neuromuscular intact otherwise.  He is well-appearing.  He does also state  that he is having some social problems with his brother at home with whom he lives with and he also just need to get away from him today he states.  Overall images are unremarkable today per radiology report.  Will prescribe Roxicodone and Flexeril for breakthrough pain.  Discharged in good condition.  He will follow-up with his eye doctor.  I do not think there is any acute issue going on.  He has follow-up with in place.  We have adequately address his pain and fall.  This chart was dictated using voice recognition software.  Despite best efforts to proofread,  errors can occur which can change the documentation meaning.         Final Clinical Impression(s) / ED Diagnoses Final diagnoses:  Fall, initial encounter    Rx / DC Orders ED Discharge Orders          Ordered    oxyCODONE (ROXICODONE) 5 MG immediate release tablet  Every 6 hours PRN        01/07/23 1439    cyclobenzaprine (FLEXERIL) 5 MG tablet  2 times daily PRN        01/07/23 1439              Skyrah Krupp, DO 01/07/23 1440

## 2023-01-07 NOTE — ED Notes (Signed)
PTAR CALLED APPROXIMATELY ONE HOUR WAIT. ADV PT USES WHEELCHAIR BUT DOES NOT HAVE ONE WITH HIM.

## 2023-01-07 NOTE — ED Triage Notes (Addendum)
Coming from home, Pt had right eye surgery 2 weeks ago, feels than swelling in right eye has progressed since them; worse ever yesterday. Stated not able to see from it(Surgery said: "It may take months for you to see from it"). Complaints of headache on the right side that irradiates to the front/upper 7 out of 10.   PT takes Eliquis, had a fall in the shower yesterday. "As far as I remember I did not hit my head". Pt report spending 3 hours on the floor.  Axo 4. Room air, follows  commands

## 2023-01-11 ENCOUNTER — Telehealth: Payer: Self-pay

## 2023-01-11 NOTE — Telephone Encounter (Signed)
Transition Care Management Unsuccessful Follow-up Telephone Call  Date of discharge and from where:  01/07/2023 [facility]  Attempts:  1st Attempt  Reason for unsuccessful TCM follow-up call:  Voice mail full  [facility]  [facility] Population Health Community Resource Care Guide   [name]  ?? [phone]   Website: [email or web address]  [email or web address]

## 2023-01-13 ENCOUNTER — Telehealth: Payer: Self-pay

## 2023-01-13 NOTE — Telephone Encounter (Signed)
Transition Care Management Unsuccessful Follow-up Telephone Call  Date of discharge and from where:  01/07/2023 [facility]  Attempts:  2nd Attempt  Reason for unsuccessful TCM follow-up call:  No answer/busy  Taylor Myers [facility]  [facility] Population Health Community Resource Care Guide   [email or web address]  ?? [phone]   Website: [email or web address]  [email or web address]

## 2023-01-21 ENCOUNTER — Ambulatory Visit: Payer: Self-pay

## 2023-01-21 NOTE — Patient Instructions (Signed)
Visit Information  Thank you for taking time to visit with me today. Please don't hesitate to contact me if I can be of assistance to you.   Following are the goals we discussed today:   Goals Addressed               This Visit's Progress     Patient Stated     COMPLETED: I have to have eye surgery (pt-stated)        Care Coordination Interventions: Evaluation of current treatment plan related to cataract extraction and patient's adherence to plan as established by provider Determined patient completed his Cataract surgery without complications and his cornea abrasion is healing  Instructed patient to notify his doctor of new or worsening symptoms       I want to continue to work with PT and OT to help me get stronger (pt-stated)        Care Coordination Interventions: Evaluation of current treatment plan related to impaired physical mobility and patient's adherence to plan as established by provider Determined patient continues to work on strengthening and endurance with a Physical Therapist, he feels he is making progress and is happy with the service he is receiving Encouraged patient to continue to adhere to his HEP as directed for the best results Educated patient about the PREP program and patient would like to proceed with asking Dr. Wylene Simmer for a referral        Other     To avoid having more falls        Care Coordination Interventions: Provided written and verbal education re: potential causes of falls and Fall prevention strategies Advised patient of importance of notifying provider of falls Assessed for falls since last encounter Assessed patients knowledge of fall risk prevention secondary to previously provided education Assessed social determinant of health barriers Sent SW referral to LCSW Jenel Lucks to assist with counseling for worsening stress and alcohol consumption, patient is agreeable       To maintain and or improve vision        Care Coordination  Interventions: 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 Valere Dross, Ophthalmologist set for 01/27/23 Sent SW referral to Jenel Lucks LCSW to assist with resources for the visually impaired, patient is agreeable        Our next appointment is by telephone on 02/07/23 at 1:15 PM  Please call the care guide team at [phone] if you need to cancel or reschedule your appointment.   If you are experiencing a Mental Health or Behavioral Health Crisis or need someone to talk to, please call 1-800-273-TALK (toll free, 24 hour hotline)  Patient verbalizes understanding of instructions and care plan provided today and agrees to view in MyChart. Active MyChart status and patient understanding of how to access instructions and care plan via MyChart confirmed with patient.     Delsa Sale, RN, BSN, CCM Care Management Coordinator [facility] Care Management  Direct Phone: [phone]

## 2023-01-21 NOTE — Patient Outreach (Signed)
Care Coordination   Follow Up Visit Note   01/21/2023 Name: Taylor Myers MRN: [id number] DOB: Feb 25, 1951  Taylor Myers is a 72 y.o. year old male who sees Tisovec, Adelfa Koh, MD for primary care. I spoke with  Taylor Myers by phone today.  What matters to the patients health and wellness today?  Patient would like to continue working with PT, he would like to participate in the PREP program. He would like to maintain or improve his vision.     Goals Addressed               This Visit's Progress     Patient Stated     COMPLETED: I have to have eye surgery (pt-stated)        Care Coordination Interventions: Evaluation of current treatment plan related to cataract extraction and patient's adherence to plan as established by provider Determined patient completed his Cataract surgery without complications and his cornea abrasion is healing  Instructed patient to notify his doctor of new or worsening symptoms       I want to continue to work with PT and OT to help me get stronger (pt-stated)        Care Coordination Interventions: Evaluation of current treatment plan related to impaired physical mobility and patient's adherence to plan as established by provider Determined patient continues to work on strengthening and endurance with a Physical Therapist, he feels he is making progress and is happy with the service he is receiving Encouraged patient to continue to adhere to his HEP as directed for the best results Educated patient about the PREP program and patient would like to proceed with asking Dr. Wylene Myers for a referral        Other     To avoid having more falls        Care Coordination Interventions: Provided written and verbal education re: potential causes of falls and Fall prevention strategies Advised patient of importance of notifying provider of falls Assessed for falls since last encounter Assessed patients knowledge of fall risk prevention secondary to previously  provided education Assessed social determinant of health barriers Sent SW referral to LCSW Taylor Myers to assist with counseling for worsening stress and alcohol consumption, patient is agreeable       To maintain and or improve vision        Care Coordination Interventions: 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 Valere Myers, Ophthalmologist set for 01/27/23 Sent SW referral to Taylor Lucks LCSW to assist with resources for the visually impaired, patient is agreeable    Interventions Today    Flowsheet Row Most Recent Value  Chronic Disease   Chronic disease during today's visit Other  [falls, vision impairment]  General Interventions   General Interventions Discussed/Reviewed General Interventions Discussed, General Interventions Reviewed, Communication with, Doctor Visits  Doctor Visits Discussed/Reviewed Doctor Visits Reviewed, Doctor Visits Discussed, PCP  Communication with RN  [Amanda]  Exercise Interventions   Exercise Discussed/Reviewed Exercise Discussed, Exercise Reviewed, Physical Activity  Physical Activity Discussed/Reviewed PREP, Home Exercise Program (HEP), Types of exercise, Physical Activity Reviewed, Physical Activity Discussed  Education Interventions   Education Provided Provided Education  Provided Verbal Education On Exercise, When to see the doctor, Medication, Eye Care  Mental Health Interventions   Mental Health Discussed/Reviewed Mental Health Discussed, Mental Health Reviewed, Coping Strategies, Refer to Social Work for resources, Refer to Social Work for counseling  Refer to Social Work for counseling regarding Substance Abuse, Anxiety/Coping   [increased alcohol consumption]  Refer to Social Work for resources regarding Other  [vision impaired]  Pharmacy Interventions   Pharmacy Dicussed/Reviewed Pharmacy Topics Discussed, Pharmacy Topics Reviewed, Medications and their functions  Safety Interventions   Safety Discussed/Reviewed Safety Discussed, Safety Reviewed, Fall Risk, Home Safety  Home Safety Assistive Devices          SDOH assessments and interventions completed:  No     Care Coordination Interventions:  Yes, provided   Follow up plan: Referral made to SW Taylor Lucks LCSW for resources for the visually impaired, counseling for worsening stress and alcohol consumption  Follow up call scheduled for 02/07/23 @1 :15 PM    Encounter Outcome:  Pt. Visit Completed

## 2023-01-27 ENCOUNTER — Other Ambulatory Visit: Payer: Self-pay | Admitting: Cardiology

## 2023-01-27 DIAGNOSIS — I4821 Permanent atrial fibrillation: Secondary | ICD-10-CM

## 2023-01-27 NOTE — Telephone Encounter (Signed)
Prescription refill request for Eliquis received. Indication: Afib  Last office visit: 10/04/22 Taylor Myers)  Scr: 0.76 (12/30/22)  Age: 72 Weight: 72.6kg  Appropriate dose. Refill sent.

## 2023-01-28 ENCOUNTER — Telehealth: Payer: Self-pay

## 2023-01-28 NOTE — Telephone Encounter (Signed)
Called re: PREP program referral, left voicemail requesting return call.  

## 2023-02-07 ENCOUNTER — Ambulatory Visit: Payer: Self-pay

## 2023-02-07 DIAGNOSIS — I10 Essential (primary) hypertension: Secondary | ICD-10-CM

## 2023-02-07 DIAGNOSIS — I4891 Unspecified atrial fibrillation: Secondary | ICD-10-CM

## 2023-02-07 NOTE — Patient Instructions (Signed)
Visit Information  Thank you for taking time to visit with me today. Please don't hesitate to contact me if I can be of assistance to you.   Following are the goals we discussed today:   Goals Addressed               This Visit's Progress     Patient Stated     I want to continue to work with PT and OT to help me get stronger (pt-stated)        Care Coordination Interventions: Evaluation of current treatment plan related to impaired physical mobility and patient's adherence to plan as established by provider Determined patient continues to work on strengthening and endurance to improve his mobility and balance Advised patient, PCP approved participation in the PREP program following completion of PT Advised patient of next steps, discussed Myles Lipps will outreach to him to schedule a start date  Discussed patient would like clearance from his Ophthalmologist before starting the program  Sent secure email to Myles Lipps PREP nurse with patient's time of availability this week to take a call       Other     To avoid having more falls        Care Coordination Interventions: Assessed for falls since last encounter Assessed patients knowledge of fall risk prevention secondary to previously provided education      To maintain and or improve vision        Care Coordination Interventions: Evaluation of current treatment plan 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 Valere Dross Discussed with patient his recovery is going well, reviewed next scheduled upcoming f/u visit for measurement of a contact lens for the left eye Determined patient verbalizes understanding of his prescribed treatment plan to undergo left Cataract extraction in about 4 weeks         Our next appointment is by telephone on 03/09/23 at 12 PM  Please call the care guide team at  [phone] if you need to cancel or reschedule your appointment.   If you are experiencing a Mental Health or Behavioral Health Crisis or need someone to talk to, please call 1-800-273-TALK (toll free, 24 hour hotline)  Patient verbalizes understanding of instructions and care plan provided today and agrees to view in MyChart. Active MyChart status and patient understanding of how to access instructions and care plan via MyChart confirmed with patient.     Delsa Sale, RN, BSN, CCM Care Management Coordinator [facility] Care Management Direct Phone: [phone]

## 2023-02-07 NOTE — Patient Outreach (Signed)
  Care Coordination   Follow Up Visit Note   02/07/2023 Name: Rease Rowlison MRN: [id number] DOB: [birth year]  Sebastyan Pinkhasov is a 72 y.o. year old male who sees Tisovec, Adelfa Koh, MD for primary care. I spoke with  [name] by phone today.  What matters to the patients health and wellness today?  Patient would like to participate in the PREP program. He would like to get assistance with the cost of Eliquis.     Goals Addressed               This Visit's Progress     Patient Stated     I want to continue to work with PT and OT to help me get stronger (pt-stated)        Care Coordination Interventions: Evaluation of current treatment plan related to impaired physical mobility and patient's adherence to plan as established by provider Determined patient continues to work on strengthening and endurance to improve his mobility and balance Advised patient, PCP approved participation in the PREP program following completion of PT Advised patient of next steps, discussed Myles Lipps will outreach to him to schedule a start date  Discussed patient would like clearance from his Ophthalmologist before starting the program  Sent secure email to Myles Lipps PREP nurse with patient's time of availability this week to take a call       Other     To avoid having more falls        Care Coordination Interventions: Assessed for falls since last encounter Assessed patients knowledge of fall risk prevention secondary to previously provided education      To maintain and or improve vision        Care Coordination Interventions: Evaluation of current treatment plan 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 Valere Dross Discussed with patient his recovery is going well, reviewed next scheduled upcoming f/u visit for measurement of a contact lens for the left eye Determined  patient verbalizes understanding of his prescribed treatment plan to undergo left Cataract extraction in about 4 weeks     Interventions Today    Flowsheet Row Most Recent Value  Chronic Disease   Chronic disease during today's visit Other  [s/p right cataract extraction]  General Interventions   General Interventions Discussed/Reviewed General Interventions Discussed, General Interventions Reviewed, Doctor Visits, Communication with  Doctor Visits Discussed/Reviewed Doctor Visits Discussed, Doctor Visits Reviewed, Specialist  Communication with Pharmacists  [needs help with cost of Eliquis]  Exercise Interventions   Exercise Discussed/Reviewed Exercise Discussed, Exercise Reviewed, Physical Activity  Physical Activity Discussed/Reviewed Physical Activity Reviewed, Physical Activity Discussed, PREP  Education Interventions   Education Provided Provided Education  Provided Verbal Education On Medication  Pharmacy Interventions   Pharmacy Dicussed/Reviewed Pharmacy Topics Discussed, Pharmacy Topics Reviewed, Affording Medications, Referral to Pharmacist  Referral to Pharmacist Cannot afford medications  [Eliquis]  Safety Interventions   Safety Discussed/Reviewed Safety Discussed, Safety Reviewed, Fall Risk          SDOH assessments and interventions completed:  No     Care Coordination Interventions:  Yes, provided   Follow up plan: Referral made to [facility] Pharmacy team to assist with cost of Eliquis  Follow up call scheduled for 03/09/23 @12  PM    Encounter Outcome:  Pt. Visit Completed

## 2023-02-15 ENCOUNTER — Telehealth: Payer: Self-pay | Admitting: Pharmacy Technician

## 2023-02-15 DIAGNOSIS — Z5986 Financial insecurity: Secondary | ICD-10-CM

## 2023-02-15 NOTE — Progress Notes (Signed)
[facility] Customer service manager [facility])  [facility] Quality Pharmacy Team   02/15/2023  Taylor Myers 17-Aug-1951 [id number]  Reason for referral: Medication assistance  Referral source: [facility] RN [name] Current insurance:  [employer] Part D  Outreach:  Successful telephone call with patient.  HIPAA identifiers verified.    Medication Assistance Findings:  Medication assistance needs identified: Eliquis  Extra Help:  Not eligible for Extra Help Low Income Subsidy based on reported income and assets  Additional medication assistance options reviewed with patient as warranted:  No other options identified  Plan: I will route patient assistance letter to [facility] pharmacy technician who will coordinate patient assistance program application process for medications listed above.  [facility] pharmacy technician will assist with obtaining all required documents from both patient and provider(s) and submit application(s) once completed.  Thank you for allowing [facility] pharmacy to be a part of this patient's care.   Pattricia Boss, CPhT [facility] Office: [phone] Fax: [phone] Email: [email or web address]

## 2023-02-18 ENCOUNTER — Telehealth: Payer: Self-pay | Admitting: Pharmacy Technician

## 2023-02-18 DIAGNOSIS — Z5986 Financial insecurity: Secondary | ICD-10-CM

## 2023-02-18 NOTE — Progress Notes (Signed)
[facility] Customer service manager [facility])                                            [facility] Quality Pharmacy Team    02/18/2023  Taylor Myers 1951-06-14 [id number]                                      Medication Assistance Referral  Referral From:  [facility] CM RN Delsa Sale  Medication/Company: Eliquis / BMS Patient application portion:  Mailed Provider application portion: Faxed  to Dr. Epifanio Lesches Provider address/fax verified via:  Mailed  Pattricia Boss, CPhT [facility] Office: [phone] Fax: [phone] Email: [email or web address]

## 2023-03-09 ENCOUNTER — Ambulatory Visit: Payer: Self-pay

## 2023-03-09 NOTE — Patient Instructions (Signed)
Visit Information  Thank you for taking time to visit with me today. Please don't hesitate to contact me if I can be of assistance to you.   Following are the goals we discussed today:   Goals Addressed             This Visit's Progress    To avoid having more falls       Care Coordination Interventions: Advised patient of importance of notifying provider of falls Assessed for falls since last encounter Assessed patients knowledge of fall risk prevention secondary to previously provided education     To complete the application for assistance with cost of Eliquis       Care Coordination Interventions: Evaluation of current treatment plan related to medication cost and patient's adherence to plan as established by provider Confirmed patient completed a call with the Simcox, Stacie Acres, CPhT regarding resources to assist with cost of Eliquis  Determined patient received the application and is in the process of completing the application to submit to his PCP for completion and submission         Our next appointment is by telephone on 04/18/23 at 2:00 PM  Please call the care guide team at [phone] if you need to cancel or reschedule your appointment.   If you are experiencing a Mental Health or Behavioral Health Crisis or need someone to talk to, please call 1-800-273-TALK (toll free, 24 hour hotline)  Patient verbalizes understanding of instructions and care plan provided today and agrees to view in MyChart. Active MyChart status and patient understanding of how to access instructions and care plan via MyChart confirmed with patient.     Delsa Sale, RN, BSN, CCM Care Management Coordinator [facility] Care Management Direct Phone: [phone]

## 2023-03-09 NOTE — Patient Outreach (Signed)
  Care Coordination   Follow Up Visit Note   03/09/2023 Name: Taylor Myers MRN: [id number] DOB: 1951/07/26  Taylor Myers is a 72 y.o. year old male who sees Tisovec, Adelfa Koh, MD for primary care. I spoke with  [name] by phone today.  What matters to the patients health and wellness today?  Patient needs assistance with cost of Eliquis.     Goals Addressed             This Visit's Progress    To avoid having more falls       Care Coordination Interventions: Advised patient of importance of notifying provider of falls Assessed for falls since last encounter Assessed patients knowledge of fall risk prevention secondary to previously provided education     To complete the application for assistance with cost of Eliquis       Care Coordination Interventions: Evaluation of current treatment plan related to medication cost and patient's adherence to plan as established by provider Confirmed patient completed a call with the Simcox, Stacie Acres, CPhT regarding resources to assist with cost of Eliquis  Determined patient received the application and is in the process of completing the application to submit to his PCP for completion and submission     Interventions Today    Flowsheet Row Most Recent Value  General Interventions   General Interventions Discussed/Reviewed General Interventions Discussed, General Interventions Reviewed  Education Interventions   Education Provided Provided Education  Provided Verbal Education On Medication  Pharmacy Interventions   Pharmacy Dicussed/Reviewed Pharmacy Topics Discussed, Pharmacy Topics Reviewed, Affording Medications          SDOH assessments and interventions completed:  No     Care Coordination Interventions:  Yes, provided   Follow up plan: Follow up call scheduled for 04/18/23 @2 :00 PM    Encounter Outcome:  Pt. Visit Completed

## 2023-04-14 ENCOUNTER — Ambulatory Visit: Payer: Medicare Other | Attending: Cardiology | Admitting: Cardiology

## 2023-04-14 NOTE — Progress Notes (Deleted)
Cardiology Office Note:    Date:  04/14/2023   ID:  Symir Flitter, DOB 09/05/1950, MRN [id number]  PCP:  Gaspar Garbe, MD  Cardiologist:  Little Ishikawa, MD  Electrophysiologist:  None   Referring MD: Gaspar Garbe, MD   No chief complaint on file.   History of Present Illness:    Taylor Myers is a 72 y.o. male with a hx of atrial fibrillation, PAD, CVA, T2DM, hypertension, alcohol abuse who presents for follow-up.  He was admitted 06/2022 with altered mental status and falls.  Cardiology was consulted for atrial fibrillation.  He was noted on telemetry to be in A-fib with rates down to 30s to 40s and pauses up to 3 seconds.  Home metoprolol and digoxin were held with improvement in heart rate.  He was continued on Eliquis for anticoagulation.  Echocardiogram 07/13/2022 showed EF 55 to 60%, normal RV function, mild biatrial enlargement, no significant valvular disease.  Zio patch x 14 days on 08/17/2022 showed 100% A-fib burden, average rate 82 bpm, occasional PVCs (2.6% of beats), 17 episodes of NSVT with longest lasting 17 beats.  Since discharge from hospital, he reports he has been doing okay.  Had some lightheadedness recently, called EMS and received IV fluids.  No issues since.  Denies any chest pain, dyspnea, lower extremity edema, or palpitations.  Reports BP has been well-controlled.  Denies any bleeding on Eliquis.  He is pretty much wheelchair-bound.  He did have a fall recently where he slid to the ground while trying to get into his bed.   Past Medical History:  Diagnosis Date   A-fib (HCC)    Alcohol abuse, in remission    Arthritis    Asthma    child hood asthma   History of kidney stones    "dormant kidney stone"   Hypertension    Wheelchair dependent     Past Surgical History:  Procedure Laterality Date   ANKLE SURGERY Right    APPLICATION OF WOUND VAC Right 03/19/2022   Procedure: APPLICATION OF WOUND VAC;  Surgeon: Tarry Kos, MD;   Location: [facility] OR;  Service: Orthopedics;  Laterality: Right;   HIP SURGERY Right    total replacement   KNEE SURGERY     bilateral replacements   ORIF HUMERUS FRACTURE Right 03/19/2022   Procedure: OPEN REDUCTION INTERNAL FIXATION (ORIF) RIGHT DISTAL HUMERUS FRACTURE;  Surgeon: Tarry Kos, MD;  Location: [facility] OR;  Service: Orthopedics;  Laterality: Right;    Current Medications: No outpatient medications have been marked as taking for the 04/14/23 encounter (Appointment) with Little Ishikawa, MD.     Allergies:   Patient has no known allergies.   Social History   Socioeconomic History   Marital status: Divorced    Spouse name: Not on file   Number of children: Not on file   Years of education: Not on file   Highest education level: Not on file  Occupational History   Not on file  Tobacco Use   Smoking status: Former    Types: Cigarettes   Smokeless tobacco: Never  Vaping Use   Vaping status: Never Used  Substance and Sexual Activity   Alcohol use: Yes    Alcohol/week: 2.0 standard drinks of alcohol    Types: 2 Standard drinks or equivalent per week    Comment: occasionally   Drug use: Not Currently   Sexual activity: Not on file  Other Topics Concern   Not on file  Social  History Narrative   Not on file   Social Determinants of Health   Financial Resource Strain: Not on file  Food Insecurity: Not on file  Transportation Needs: No Transportation Needs (07/30/2022)   PRAPARE - Administrator, Civil Service (Medical): No    Lack of Transportation (Non-Medical): No  Physical Activity: Not on file  Stress: Not on file  Social Connections: Not on file     Family History: The patient's family history is not on file.  ROS:   Please see the history of present illness.     All other systems reviewed and are negative.  EKGs/Labs/Other Studies Reviewed:    The following studies were reviewed today:   EKG:   10/04/2022: Atrial fibrillation, PVCs,  rate 83, right bundle branch block, left axis deviation, poor R wave progression, T wave inversions in leads V4-5  Recent Labs: 07/15/2022: B Natriuretic Peptide 339.5 10/04/2022: TSH 0.973 12/11/2022: ALT 17; Magnesium 1.7 12/30/2022: BUN 9; Creatinine, Ser 0.76; Hemoglobin 13.7; Platelets 211; Potassium 3.5; Sodium 146  Recent Lipid Panel    Component Value Date/Time   CHOL 153 10/04/2022 1019   TRIG 105 10/04/2022 1019   HDL 87 10/04/2022 1019   CHOLHDL 1.8 10/04/2022 1019   LDLCALC 48 10/04/2022 1019    Physical Exam:    VS:  There were no vitals taken for this visit.    Wt Readings from Last 3 Encounters:  12/30/22 160 lb 0.9 oz (72.6 kg)  11/18/22 160 lb (72.6 kg)  10/04/22 166 lb 9.6 oz (75.6 kg)     GEN:  Well nourished, well developed in no acute distress HEENT: Normal NECK: No JVD; No carotid bruits LYMPHATICS: No lymphadenopathy CARDIAC: Irregular, normal rate, no murmurs, rubs, gallops RESPIRATORY:  Clear to auscultation without rales, wheezing or rhonchi  ABDOMEN: Soft, non-tender, non-distended MUSCULOSKELETAL:  No edema; No deformity  SKIN: Warm and dry NEUROLOGIC:  Alert and oriented x 3 PSYCHIATRIC:  Normal affect   ASSESSMENT:    No diagnosis found.  PLAN:    Atrial fibrillation: Likely permanent Afib, reports has been in Afib for years.  During admission 06/2022 noted to have A-fib with significant bradycardia down to heart rate 30s to 40s and pauses up to 3 seconds.  Home metoprolol and digoxin were discontinued.  Echocardiogram 07/13/2022 showed EF 55 to 60%, normal RV function, mild biatrial enlargement, no significant valvular disease.  Zio patch x 14 days on 08/17/2022 showed 100% A-fib burden, average rate 82 bpm, occasional PVCs (2.6% of beats), 17 episodes of NSVT with longest lasting 17 beats. 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  Appears irregular rhythm during episodes, suspect A-fib with aberrancy.    Hypertension: On amlodipine 5 mg daily.  It appears controlled  Hyperlipidemia: On atorvastatin 40 mg daily.  LDL 48 on 10/23/21  PAD: Reported history, however normal ABIs 09/2022  OSA: Severe OSA on sleep study 09/2022.  Starting CPAP***   RTC in 6 months***   Medication Adjustments/Labs and Tests Ordered: Current medicines are reviewed at length with the patient today.  Concerns regarding medicines are outlined above.  No orders of the defined types were placed in this encounter.  No orders of the defined types were placed in this encounter.   There are no Patient Instructions on file for this visit.  Signed, Little Ishikawa, MD  04/14/2023 12:16 PM    [facility]

## 2023-04-18 ENCOUNTER — Ambulatory Visit: Payer: Self-pay

## 2023-04-18 NOTE — Patient Instructions (Signed)
Visit Information  Thank you for taking time to visit with me today. Please don't hesitate to contact me if I can be of assistance to you.   Following are the goals we discussed today:   Goals Addressed             This Visit's Progress    To avoid having more falls   On track    Care Coordination Interventions: Provided written and verbal education re: potential causes of falls and Fall prevention strategies Assessed for signs and symptoms of orthostatic hypotension Assessed for falls since last encounter Assessed patients knowledge of fall risk prevention secondary to previously provided education      COMPLETED: To complete the application for assistance with cost of Eliquis       Care Coordination Interventions: Evaluation of current treatment plan related to medication cost and patient's adherence to plan as established by provider Determined patient received the financial application for assistance with Eliquis, he has not had time to complete the application but may consider to in the future, for now reports he will continue to pay out of pocket      COMPLETED: To maintain and or improve vision       Care Coordination Interventions: Evaluation of current treatment plan related to Impaired Vision and patient's adherence to plan as established by provider Determined patient is s/p right Cataract extraction completed by Dr. Valere Dross, reports full recovery  Determined patient continues to f/u with Dr. Valere Dross for eye care as directed  Review of patient status, including review of consultant's reports, relevant laboratory and other test results, and medications completed        Our next appointment is by telephone on 05/16/23 at 12:30 PM  Please call the care guide team at [phone] if you need to cancel or reschedule your appointment.   If you are experiencing a Mental Health or Behavioral Health Crisis or need someone to talk to, please call 1-800-273-TALK (toll  free, 24 hour hotline)  Patient verbalizes understanding of instructions and care plan provided today and agrees to view in MyChart. Active MyChart status and patient understanding of how to access instructions and care plan via MyChart confirmed with patient.     Delsa Sale, RN, BSN, CCM Care Management Coordinator [facility] Care Management  Direct Phone: [phone]

## 2023-04-18 NOTE — Patient Outreach (Signed)
  Care Coordination   Follow Up Visit Note   04/18/2023 Name: Muhammadyusuf Razi MRN: [id number] DOB: 25-May-1951  Sladen Hurd is a 72 y.o. year old male who sees Tisovec, Adelfa Koh, MD for primary care. I spoke with  [name] by phone today.  What matters to the patients health and wellness today?  Patient would like to continue to stay healthy by managing his chronic health conditions one day at a time.     Goals Addressed             This Visit's Progress    To avoid having more falls   On track    Care Coordination Interventions: Provided written and verbal education re: potential causes of falls and Fall prevention strategies Assessed for signs and symptoms of orthostatic hypotension Assessed for falls since last encounter Assessed patients knowledge of fall risk prevention secondary to previously provided education     COMPLETED: To complete the application for assistance with cost of Eliquis       Care Coordination Interventions: Evaluation of current treatment plan related to medication cost and patient's adherence to plan as established by provider Determined patient received the financial application for assistance with Eliquis, he has not had time to complete the application but may consider to in the future, for now reports he will continue to pay out of pocket      COMPLETED: To maintain and or improve vision       Care Coordination Interventions: Evaluation of current treatment plan related to Impaired Vision and patient's adherence to plan as established by provider Determined patient is s/p right Cataract extraction completed by Dr. Valere Dross, reports full recovery  Determined patient continues to f/u with Dr. Valere Dross for eye care as directed  Review of patient status, including review of consultant's reports, relevant laboratory and other test results, and medications completed    Interventions Today    Flowsheet Row Most Recent Value  Chronic Disease    Chronic disease during today's visit Hypertension (HTN), Other  [imparied physical mobility,  impaired vision]  General Interventions   General Interventions Discussed/Reviewed General Interventions Discussed, General Interventions Reviewed, Doctor Visits, Durable Medical Equipment (DME)  Doctor Visits Discussed/Reviewed Doctor Visits Discussed, Doctor Visits Reviewed, Specialist  Durable Medical Equipment (DME) Wheelchair  Wheelchair Standard  Exercise Interventions   Exercise Discussed/Reviewed Exercise Reviewed, Exercise Discussed, Physical Activity  Physical Activity Discussed/Reviewed Physical Activity Reviewed, Physical Activity Discussed, Types of exercise  Education Interventions   Education Provided Provided Education  Provided Verbal Education On Medication, When to see the doctor  Pharmacy Interventions   Pharmacy Dicussed/Reviewed Pharmacy Topics Reviewed, Pharmacy Topics Discussed, Medications and their functions  Safety Interventions   Safety Discussed/Reviewed Home Safety, Fall Risk, Safety Reviewed, Safety Discussed  Home Safety Assistive Devices          SDOH assessments and interventions completed:  No     Care Coordination Interventions:  Yes, provided   Follow up plan: Follow up call scheduled for 05/16/23 @12 :30 PM    Encounter Outcome:  Pt. Visit Completed

## 2023-05-12 ENCOUNTER — Other Ambulatory Visit: Payer: Self-pay

## 2023-05-12 DIAGNOSIS — I4821 Permanent atrial fibrillation: Secondary | ICD-10-CM

## 2023-05-12 MED ORDER — APIXABAN 5 MG PO TABS
5.0000 mg | ORAL_TABLET | Freq: Two times a day (BID) | ORAL | 5 refills | Status: DC
Start: 2023-05-12 — End: 2023-09-02

## 2023-05-12 NOTE — Telephone Encounter (Signed)
Prescription refill request for Eliquis received. Indication: Afib  Last office visit: 10/04/22 Bjorn Pippin)  Scr: 0.76 (12/27/22)  Age: 72 Weight: 72.6kg  Appropriate dose. Refill sent.

## 2023-05-16 ENCOUNTER — Ambulatory Visit: Payer: Self-pay

## 2023-05-16 NOTE — Patient Outreach (Signed)
Care Coordination   05/16/2023 Name: Taylor Myers MRN: [id number] DOB: 09/14/1950   Care Coordination Outreach Attempts:  An unsuccessful telephone outreach was attempted for a scheduled appointment today.  Follow Up Plan:  Additional outreach attempts will be made to offer the patient care coordination information and services.   Encounter Outcome:  No Answer   Care Coordination Interventions:  No, not indicated    Delsa Sale RN BSN CCM [place]  [facility], [facility] Health Nurse Care Coordinator  Direct Dial: [phone] Website: [name]

## 2023-05-16 NOTE — Patient Outreach (Signed)
Care Coordination   Follow Up Visit Note   05/16/2023 Name: Taylor Myers MRN: [id number] DOB: 03-05-1951  Taylor Myers is a 72 y.o. year old male who sees Tisovec, Adelfa Koh, MD for primary care. I spoke with  Taylor Myers by phone today.  What matters to the patients health and wellness today?  Patient would like to continue to work on getting stronger. He would like to f/u with his Eye Specialist to discuss alternatives to contact lens.     Goals Addressed               This Visit's Progress     Patient Stated     COMPLETED: I want to continue to work with PT and OT to help me get stronger (pt-stated)        Care Coordination Interventions: Evaluation of current treatment plan related to impaired physical mobility and patient's adherence to plan as established by provider Determined patient's therapy is on hold until he fully recovers from his recent eye surgery Discussed with patient he has resumed adhering to his HEP and feels he is getting stronger Assessed for DME needs, patient voices having all DME needed at this time       Other     To avoid having more falls   On track     Care Coordination Interventions: Provided written and verbal education re: potential causes of falls and Fall prevention strategies Assessed for signs and symptoms of orthostatic hypotension Assessed for falls since last encounter Assessed patients knowledge of fall risk prevention secondary to previously provided education Reviewed and discussed with patient his next upcoming scheduled follow up with Dr. Wylene Simmer scheduled for 07/29/23 @11 :30 AM      To recover from recent cataract sugery        Care Coordination Interventions: Evaluation of current treatment plan related to Cataract extraction from right eye  and patient's adherence to plan as established by provider Review of patient status, including review of consultant's reports, relevant laboratory and other test results, and medications  completed Determined patient verbalizes understanding of his post operative instructions and feels he is making a good recover Placed joint call to Dr. Holli Humbles Ophthalmologist to schedule a contact lens follow up visit     Interventions Today    Flowsheet Row Most Recent Value  Chronic Disease   Chronic disease during today's visit Other  [s/p cataract surgery,  impaired physical mobility]  General Interventions   General Interventions Discussed/Reviewed General Interventions Discussed, General Interventions Reviewed, Doctor Visits, Communication with  Doctor Visits Discussed/Reviewed Doctor Visits Reviewed, Doctor Visits Discussed, Specialist, PCP  Communication with Social Work  Jenel Lucks LCSW]  Exercise Interventions   Exercise Discussed/Reviewed Exercise Reviewed, Exercise Discussed, Physical Activity  Physical Activity Discussed/Reviewed Physical Activity Discussed, Physical Activity Reviewed, Home Exercise Program (HEP)  Education Interventions   Education Provided Provided Education  Provided Verbal Education On Exercise, When to see the doctor, Mental Health/Coping with Illness  [Jasmine Lewis]  Mental Health Interventions   Mental Health Discussed/Reviewed Mental Health Discussed, Mental Health Reviewed, Coping Strategies, Anxiety, Depression, Refer to Social Work for resources, Research scientist (physical sciences) to Social Work for counseling  Refer to Social Work for counseling regarding Anxiety/Coping, Artist Stress]  Nutrition Interventions   Nutrition Discussed/Reviewed Nutrition Reviewed, Nutrition Discussed  Pharmacy Interventions   Pharmacy Dicussed/Reviewed Pharmacy Topics Reviewed, Pharmacy Topics Discussed  Safety Interventions   Safety Discussed/Reviewed Home Safety, Fall Risk, Safety Reviewed, Safety Discussed  Journalist, newspaper  SDOH assessments and interventions completed:  No     Care Coordination Interventions:  Yes, provided    Follow up plan: Follow up call scheduled for 08/02/23 @1 :00 PM     Encounter Outcome:  Patient Visit Completed

## 2023-05-16 NOTE — Patient Instructions (Addendum)
Visit Information  Thank you for taking time to visit with me today. Please don't hesitate to contact me if I can be of assistance to you.   Following are the goals we discussed today:   Goals Addressed               This Visit's Progress     Patient Stated     COMPLETED: I want to continue to work with PT and OT to help me get stronger (pt-stated)        Care Coordination Interventions: Evaluation of current treatment plan related to impaired physical mobility and patient's adherence to plan as established by provider Determined patient's therapy is on hold until he fully recovers from his recent eye surgery Discussed with patient he has resumed adhering to his HEP and feels he is getting stronger Assessed for DME needs, patient voices having all DME needed at this time       Other     To avoid having more falls   On track     Care Coordination Interventions: Provided written and verbal education re: potential causes of falls and Fall prevention strategies Assessed for signs and symptoms of orthostatic hypotension Assessed for falls since last encounter Assessed patients knowledge of fall risk prevention secondary to previously provided education Reviewed and discussed with patient his next upcoming scheduled follow up with Dr. Wylene Simmer scheduled for 07/29/23 @11 :30 AM      To recover from recent cataract sugery        Care Coordination Interventions: Evaluation of current treatment plan related to Cataract extraction from right eye  and patient's adherence to plan as established by provider Review of patient status, including review of consultant's reports, relevant laboratory and other test results, and medications completed Determined patient verbalizes understanding of his post operative instructions and feels he is making a good recover Placed joint call to Dr. Holli Humbles Ophthalmologist to schedule a contact lens follow up visit        Our next appointment is by  telephone on 08/02/23 at 1:00 PM  Please call the care guide team at [phone] if you need to cancel or reschedule your appointment.   If you are experiencing a Mental Health or Behavioral Health Crisis or need someone to talk to, please call 1-800-273-TALK (toll free, 24 hour hotline)  Patient verbalizes understanding of instructions and care plan provided today and agrees to view in MyChart. Active MyChart status and patient understanding of how to access instructions and care plan via MyChart confirmed with patient.     Delsa Sale RN BSN CCM [place]  [facility], [facility] Health Nurse Care Coordinator  Direct Dial: [phone] Website: [email or web address]

## 2023-05-29 ENCOUNTER — Emergency Department (HOSPITAL_COMMUNITY): Payer: Medicare Other

## 2023-05-29 ENCOUNTER — Emergency Department (HOSPITAL_COMMUNITY)
Admission: EM | Admit: 2023-05-29 | Discharge: 2023-05-29 | Disposition: A | Discharge status: Home / Self Care | Payer: Medicare Other | Attending: Emergency Medicine | Admitting: Emergency Medicine

## 2023-05-29 DIAGNOSIS — Z79899 Other long term (current) drug therapy: Secondary | ICD-10-CM | POA: Diagnosis not present

## 2023-05-29 DIAGNOSIS — G9389 Other specified disorders of brain: Secondary | ICD-10-CM | POA: Insufficient documentation

## 2023-05-29 DIAGNOSIS — R600 Localized edema: Secondary | ICD-10-CM | POA: Diagnosis present

## 2023-05-29 DIAGNOSIS — W19XXXA Unspecified fall, initial encounter: Secondary | ICD-10-CM | POA: Diagnosis not present

## 2023-05-29 DIAGNOSIS — I4891 Unspecified atrial fibrillation: Secondary | ICD-10-CM | POA: Diagnosis not present

## 2023-05-29 DIAGNOSIS — H05021 Osteomyelitis of right orbit: Secondary | ICD-10-CM | POA: Diagnosis not present

## 2023-05-29 DIAGNOSIS — R9082 White matter disease, unspecified: Secondary | ICD-10-CM | POA: Insufficient documentation

## 2023-05-29 DIAGNOSIS — Z7901 Long term (current) use of anticoagulants: Secondary | ICD-10-CM | POA: Diagnosis not present

## 2023-05-29 DIAGNOSIS — I10 Essential (primary) hypertension: Secondary | ICD-10-CM | POA: Insufficient documentation

## 2023-05-29 LAB — COMPREHENSIVE METABOLIC PANEL
ALT: 18 U/L (ref 0–44)
AST: 30 U/L (ref 15–41)
Albumin: 3.3 g/dL — ABNORMAL LOW (ref 3.5–5.0)
Alkaline Phosphatase: 55 U/L (ref 38–126)
Anion gap: 19 — ABNORMAL HIGH (ref 5–15)
BUN: 10 mg/dL (ref 8–23)
CO2: 23 mmol/L (ref 22–32)
Calcium: 8.1 mg/dL — ABNORMAL LOW (ref 8.9–10.3)
Chloride: 104 mmol/L (ref 98–111)
Creatinine, Ser: 0.77 mg/dL (ref 0.61–1.24)
GFR, Estimated: >60 mL/min (ref >60)
Glucose, Bld: 77 mg/dL (ref 70–99)
Potassium: 3.7 mmol/L (ref 3.5–5.1)
Sodium: 146 mmol/L — ABNORMAL HIGH (ref 135–145)
Total Bilirubin: 0.9 mg/dL (ref 0.3–1.2)
Total Protein: 6.1 g/dL — ABNORMAL LOW (ref 6.5–8.1)

## 2023-05-29 LAB — URINALYSIS, W/ REFLEX TO CULTURE (INFECTION SUSPECTED)
Bacteria, UA: NONE SEEN
Bilirubin Urine: NEGATIVE
Glucose, UA: NEGATIVE mg/dL
Ketones, ur: NEGATIVE mg/dL
Leukocytes,Ua: NEGATIVE
Nitrite: NEGATIVE
Protein, ur: NEGATIVE mg/dL
Specific Gravity, Urine: 1.012 (ref 1.005–1.030)
pH: 6 (ref 5.0–8.0)

## 2023-05-29 LAB — CBC WITH DIFFERENTIAL/PLATELET
Abs Immature Granulocytes: 0.02 10*3/uL (ref 0.00–0.07)
Basophils Absolute: 0.1 10*3/uL (ref 0.0–0.1)
Basophils Relative: 1 %
Eosinophils Absolute: 0.1 10*3/uL (ref 0.0–0.5)
Eosinophils Relative: 2 %
HCT: 37.3 % — ABNORMAL LOW (ref 39.0–52.0)
Hemoglobin: 12.3 g/dL — ABNORMAL LOW (ref 13.0–17.0)
Immature Granulocytes: 0 %
Lymphocytes Relative: 39 %
Lymphs Abs: 1.8 10*3/uL (ref 0.7–4.0)
MCH: 33.3 pg (ref 26.0–34.0)
MCHC: 33 g/dL (ref 30.0–36.0)
MCV: 101.1 fL — ABNORMAL HIGH (ref 80.0–100.0)
Monocytes Absolute: 0.4 10*3/uL (ref 0.1–1.0)
Monocytes Relative: 9 %
Neutro Abs: 2.3 10*3/uL (ref 1.7–7.7)
Neutrophils Relative %: 49 %
Platelets: 138 10*3/uL — ABNORMAL LOW (ref 150–400)
RBC: 3.69 MIL/uL — ABNORMAL LOW (ref 4.22–5.81)
RDW: 14.6 % (ref 11.5–15.5)
WBC: 4.6 10*3/uL (ref 4.0–10.5)
nRBC: 0 % (ref 0.0–0.2)

## 2023-05-29 MED ORDER — LACTATED RINGERS IV BOLUS
1000.0000 mL | Freq: Once | INTRAVENOUS | Status: AC
  Administered 2023-05-29: 1000 mL via INTRAVENOUS

## 2023-05-29 MED ORDER — ACETAMINOPHEN 500 MG PO TABS
1000.0000 mg | ORAL_TABLET | Freq: Once | ORAL | Status: AC
  Administered 2023-05-29: 1000 mg via ORAL
  Filled 2023-05-29: qty 2

## 2023-05-29 NOTE — ED Notes (Signed)
Doctor was getting ready to discharge the PT.  Paramedic stepped into the Pt's room.  He stated that he had a headache for the last 4 weeks and that he needed pain medication.  Pt denied n/v/d, neurological changes, or visual changes.  Doctor informed.  PT also noted he had a rash around his penis and that it burned.  He asked for cream. Doctor was also informed of this face to face.

## 2023-05-29 NOTE — ED Triage Notes (Signed)
Pt BIB [facility] from home d/t a fall. Pt slid off his bed. Pt was on the floor for 1-2 hrs. Pt doe not recall the event. Not sure if he had hit his head. On eliquis. A&O X4. VSS.

## 2023-05-29 NOTE — ED Notes (Signed)
PT was slightly bewildered when paramedic came into the room to administer a fluid bolus.  Pt told paramedic that he had forgotten he was in the hospital.  He proceeded to ask paramedic which hospital he was in.

## 2023-05-29 NOTE — ED Notes (Addendum)
Pt's BP was noted to be slightly soft for the last several cycles.  Doc was informed via secure chat. LR was given

## 2023-05-29 NOTE — Discharge Instructions (Signed)
Taylor Myers:  Thank you for allowing [place] to take care of you today.  We hope you begin feeling better soon. You were seen today for fall while on Eliquis.  Imaging here was negative, which is great news.  To-Do: Please follow-up with your primary doctor to schedule an appointment with a new primary care doctor within the next 2-3 days.  Please return to the Emergency Department or call 911 if you experience chest pain, shortness of breath, severe pain, severe fever, altered mental status, or have any reason to think that you need emergency medical care.  Thank you again.  Hope you feel better soon.

## 2023-05-29 NOTE — ED Notes (Signed)
Called CT regarding this Pt due to being a fall on thinners. They advised to bring the Pt after XR finished in the room.

## 2023-05-29 NOTE — ED Provider Notes (Signed)
[place] EMERGENCY DEPARTMENT AT [facility] Provider Note   CSN: [id number] Arrival date & time: 05/29/23  1843     History  Chief Complaint  Patient presents with   Zaidan Keeble is a 72 y.o. male.   Fall  72 year old male with past medical history of atrial fibrillation on Eliquis, hypertension presenting for evaluation of fall.  Patient was at home using a heating pad for chronic pain in his bed.  The next thing he knows is he was on the ground.  When EMS arrived he was found with his legs still up on the bed, but the rest of his body was on the ground.  Unclear if he lost consciousness.  Unclear if he hit his head.  Patient complains of chronic pain at baseline, but denies any new injuries.  He denies any new numbness, tingling, weakness.  No recent fevers or illnesses.  He last took Eliquis yesterday.     Home Medications Prior to Admission medications   Medication Sig Start Date End Date Taking? Authorizing Provider  ALEVE 220 MG tablet Take 220-440 mg by mouth 2 (two) times daily as needed (for pain). Patient not taking: Reported on 10/12/2022    [provider]  allopurinol (ZYLOPRIM) 300 MG tablet Take 300 mg by mouth daily. 04/18/21   [provider]  amLODipine (NORVASC) 5 MG tablet Take 5 mg by mouth daily. 03/18/21   [provider]  apixaban (ELIQUIS) 5 MG TABS tablet Take 1 tablet (5 mg total) by mouth 2 (two) times daily. 05/12/23   Little Ishikawa, MD  atorvastatin (LIPITOR) 40 MG tablet Take 40 mg by mouth daily. 03/18/21   [provider]  CLEAR EYES PURE RELIEF PF 0.25 % SOLN Place 1 drop into both eyes See admin instructions. Instill 1 drop into both eyes two to three times a day as needed for irritation    [provider]  cyclobenzaprine (FLEXERIL) 5 MG tablet Take 1 tablet (5 mg total) by mouth 2 (two) times daily as needed for up to 20 doses for muscle spasms. 01/07/23   Virgina Norfolk, DO   DHA-EPA-Flaxseed Oil-Vitamin E [facility] NUTRITION) CAPS Take 1 capsule by mouth daily.    [provider]  erythromycin ophthalmic ointment Place 1 Application into the right eye 2 (two) times daily. 01/04/22   [provider]  Menthol-Camphor (TIGER BALM EXTRA STRENGTH) 11-10 % OINT Apply 1 application  topically See admin instructions. Apply to affected hips, shoulders, joints, or other painful sites daily as needed for discomfort    [provider]  methocarbamol (ROBAXIN) 500 MG tablet Take 1 tablet (500 mg total) by mouth 2 (two) times daily as needed. Patient taking differently: Take 500 mg by mouth 2 (two) times daily as needed for muscle spasms. 03/15/22   Cristie Hem, PA-C  Multiple Vitamins-Minerals (LIVER DETOX PO) Take 2 tablets by mouth daily.    [provider]  NON FORMULARY Take 2 capsules by mouth See admin instructions. Beet extract capsules- Take 2 capsules by mouth once a day    [provider]  ondansetron (ZOFRAN) 4 MG tablet Take 1 tablet (4 mg total) by mouth every 8 (eight) hours as needed for nausea or vomiting. 03/15/22   Cristie Hem, PA-C  OVER THE COUNTER MEDICATION Take 1 tablet by mouth 2 (two) times daily. Beet extract    [provider]  oxybutynin (DITROPAN) 5 MG tablet Take 5  mg by mouth daily. 04/18/21   [provider]  oxyCODONE (ROXICODONE) 5 MG immediate release tablet Take 1 tablet (5 mg total) by mouth every 6 (six) hours as needed for up to 10 doses for breakthrough pain. 01/07/23   Virgina Norfolk, DO  PRESCRIPTION MEDICATION Place 1 drop into the right eye See admin instructions. Autologous serum eye drops- Instill 1 drop into the right eye 2 times a day    [provider]  tamsulosin (FLOMAX) 0.4 MG CAPS capsule Take 0.4 mg by mouth daily.    [provider]  TURMERIC PO Take 1 capsule by mouth daily.    [provider]  TYLENOL 8 HOUR ARTHRITIS PAIN 650 MG CR  tablet Take 650-1,300 mg by mouth every 8 (eight) hours as needed for pain.    [provider]  venlafaxine XR (EFFEXOR-XR) 150 MG 24 hr capsule Take 150 mg by mouth daily. 04/18/21   [provider]  Vitamin D, Ergocalciferol, (DRISDOL) 1.25 MG (50000 UNIT) CAPS capsule Take 50,000 Units by mouth every Monday.    [provider]      Allergies    Patient has no known allergies.    Review of Systems   Review of Systems  Physical Exam Updated Vital Signs BP (!) 95/53   Pulse 78   Resp 18   SpO2 91%  Physical Exam Constitutional:      General: He is not in acute distress.    Appearance: He is not ill-appearing.  HENT:     Head: Normocephalic and atraumatic.     Right Ear: External ear normal.     Left Ear: External ear normal.     Nose: Nose normal.     Mouth/Throat:     Mouth: Mucous membranes are moist.     Pharynx: Oropharynx is clear.  Eyes:     Extraocular Movements: Extraocular movements intact.     Pupils: Pupils are equal, round, and reactive to light.     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     Pulses: Normal pulses.  Pulmonary:     Effort: Pulmonary effort is normal. No respiratory distress.     Breath sounds: No wheezing.  Abdominal:     General: Abdomen is flat.     Palpations: Abdomen is soft.     Tenderness: There is no abdominal tenderness. There is no guarding or rebound.  Musculoskeletal:        General: Normal range of motion.     Cervical back: Normal range of motion. No tenderness.     Right lower leg: Edema present.     Left lower leg: No edema.     Comments: Nonpitting right lower extremity edema (patient states this has been present for years and has been worked up with ultrasounds)  Skin:    General: Skin is warm and dry.     Findings: No rash.  Neurological:     General: No focal deficit present.      Mental Status: He is alert.     Cranial Nerves: No cranial nerve deficit.     Motor: No weakness.     Comments: Moving all 4 extremities.  Actually transferred himself from EMS stretcher to hospital bed     ED Results / Procedures / Treatments   Labs (all labs ordered are listed, but only abnormal results are  displayed) Labs Reviewed  CBC WITH DIFFERENTIAL/PLATELET - Abnormal; Notable for the following components:      Result Value   RBC 3.69 (*)    Hemoglobin 12.3 (*)    HCT 37.3 (*)    MCV 101.1 (*)    Platelets 138 (*)    All other components within normal limits  COMPREHENSIVE METABOLIC PANEL - Abnormal; Notable for the following components:   Sodium 146 (*)    Calcium 8.1 (*)    Total Protein 6.1 (*)    Albumin 3.3 (*)    Anion gap 19 (*)    All other components within normal limits  URINALYSIS, W/ REFLEX TO CULTURE (INFECTION SUSPECTED) - Abnormal; Notable for the following components:   Hgb urine dipstick SMALL (*)    All other components within normal limits    EKG None  Radiology DG Chest Portable 1 View  Result Date: 05/29/2023 CLINICAL DATA:  Fall EXAM: PORTABLE CHEST 1 VIEW COMPARISON:  05/11/2021 FINDINGS: Cardiomegaly. Unchanged rightward shift of the mediastinal contents, as seen by prior imaging, which does not reflect dextrocardia. Both lungs are clear. Severe bilateral glenohumeral arthrosis. IMPRESSION: Cardiomegaly without acute abnormality of the lungs in AP portable projection. Electronically Signed   By: Jearld Lesch M.D.   On: 05/29/2023 20:29   CT Head Wo Contrast  Result Date: 05/29/2023 CLINICAL DATA:  Fall from bed EXAM: CT HEAD WITHOUT CONTRAST CT CERVICAL SPINE WITHOUT CONTRAST TECHNIQUE: Multidetector CT imaging of the head and cervical spine was performed following the standard protocol without intravenous contrast. Multiplanar CT image reconstructions of the cervical spine were also generated. RADIATION DOSE REDUCTION: This exam was  performed according to the departmental dose-optimization program which includes automated exposure control, adjustment of the mA and/or kV according to patient size and/or use of iterative reconstruction technique. COMPARISON:  01/07/2023 FINDINGS: CT HEAD FINDINGS Brain: No evidence of acute infarction, hemorrhage, hydrocephalus, extra-axial collection or mass lesion/mass effect. Periventricular white matter hypodensity. Unchanged encephalomalacia of the left cerebellar hemisphere (series 3, image 6). Vascular: No hyperdense vessel or unexpected calcification. Skull: Normal. Negative for fracture or focal lesion. Sinuses/Orbits: No acute finding. Other: None. CT CERVICAL SPINE FINDINGS Alignment: Normal. Skull base and vertebrae: No acute fracture. No primary bone lesion or focal pathologic process. Soft tissues and spinal canal: No prevertebral fluid or swelling. No visible canal hematoma. Disc levels: Severe disc degenerative disease and anterior bridging osteophytosis throughout the cervical spine, with anterior ankylosis of C3-C6. Upper chest: Negative. Other: None. IMPRESSION: 1. No acute intracranial pathology. 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 2. No fracture or static subluxation of the cervical spine. 3. Severe disc degenerative disease and anterior bridging osteophytosis throughout the cervical spine, with anterior ankylosis of C3-C6. Findings consistent with severe DISH. Electronically Signed   By: Jearld Lesch M.D.   On: 05/29/2023 20:26   CT Cervical Spine Wo Contrast  Result Date: 05/29/2023 CLINICAL DATA:  Fall from bed EXAM: CT HEAD WITHOUT CONTRAST CT CERVICAL SPINE WITHOUT CONTRAST TECHNIQUE: Multidetector CT imaging of the head and cervical spine was performed following the standard protocol without intravenous contrast. Multiplanar CT image reconstructions of the cervical spine were also generated. RADIATION DOSE REDUCTION:  This exam was performed according to the departmental dose-optimization program which includes automated exposure control, adjustment of the mA and/or kV according to patient size and/or use of iterative reconstruction technique. COMPARISON:  01/07/2023 FINDINGS: CT HEAD FINDINGS Brain: No evidence of acute infarction, hemorrhage,  hydrocephalus, extra-axial collection or mass lesion/mass effect. Periventricular white matter hypodensity. Unchanged encephalomalacia of the left cerebellar hemisphere (series 3, image 6). Vascular: No hyperdense vessel or unexpected calcification. Skull: Normal. Negative for fracture or focal lesion. Sinuses/Orbits: No acute finding. Other: None. CT CERVICAL SPINE FINDINGS Alignment: Normal. Skull base and vertebrae: No acute fracture. No primary bone lesion or focal pathologic process. Soft tissues and spinal canal: No prevertebral fluid or swelling. No visible canal hematoma. Disc levels: Severe disc degenerative disease and anterior bridging osteophytosis throughout the cervical spine, with anterior ankylosis of C3-C6. Upper chest: Negative. Other: None. IMPRESSION: 1. No acute intracranial pathology. 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 2. No fracture or static subluxation of the cervical spine. 3. Severe disc degenerative disease and anterior bridging osteophytosis throughout the cervical spine, with anterior ankylosis of C3-C6. Findings consistent with severe DISH. Electronically Signed   By: Jearld Lesch M.D.   On: 05/29/2023 20:26    Procedures Procedures    Medications Ordered in ED Medications  lactated ringers bolus 1,000 mL (1,000 mLs Intravenous New Bag/Given 05/29/23 2032)    ED Course/ Medical Decision Making/ A&P                                 Medical Decision Making Amount and/or Complexity of Data Reviewed Labs: ordered. Radiology: ordered.   Acelin Ferdig is a 72 y.o. male with  significant PMH of atrial fibrillation on Eliquis, hypertension  who presented to the ED as a non-leveled trauma secondary to fall versus syncopal episode.  Patient was found at home with his trunk and upper body on the ground, but his legs elevated on the bed.  Patient was using heating pad before while sitting in bed, he does not remember falling or hitting his head.  Unclear loss of consciousness.  ABCs intact. GCS 15.  Afebrile, hemodynamically stable. PE as below, notable for chronic changes such as exophthalmos of the right eye and edema of the right leg.  On multiple occasions patient is reiterate that these are chronic changes and not new.  No acute traumatic findings identified.  Differential includes but is not limited to: subdural hematoma, epidural hematoma, DAI, SAH, other TBI, cervical spine injury, other spinal injury, perforated viscous, internal bleeding, rib fractures, pneumothorax, hemothorax, and other life/limb threatening traumatic injuries  CT scans of the patient's head and neck were completed.  No intracranial abnormality was seen.  There was some chronic small vessel white matter changes and unchanged encephalomalacia, primarily within the left cerebellar hemisphere.  He does have severe degenerative changes within the neck, these are well-known.  Chest x-ray negative for obvious rib fracture, pneumothorax.  No other focal airspace disease was seen.  Patient's lab work reviewed.  Hemoglobin is 12.3.  No evidence of leukocytosis.  Sodium was mildly elevated at 146, but no other gross metabolic derangements.  No AKI.  Urinalysis negative for signs of infection.  Discussed these results with the patient, who is relieved.  He states that he is continue to feel well without any recurrence of symptoms.  No chest pain, dizziness, shortness of breath.  He is comfortable returning home with close outpatient follow-up.  Feel this is reasonable given above workup.  He is to continue taking all  medications as prescribed.  Strict return precautions provided.   The plan for this patient was discussed with Dr. Hyacinth Meeker, who voiced agreement  and who oversaw evaluation and treatment of this patient.   Final Clinical Impression(s) / ED Diagnoses Final diagnoses:  Fall, initial encounter    Rx / DC Orders ED Discharge Orders     None         Lyman Speller, MD 05/29/23 2141    Eber Hong, MD 05/30/23 [phone]

## 2023-06-15 ENCOUNTER — Emergency Department (HOSPITAL_COMMUNITY)
Admission: EM | Admit: 2023-06-15 | Discharge: 2023-06-15 | Disposition: A | Discharge status: Home / Self Care | Payer: Medicare Other | Attending: Emergency Medicine | Admitting: Emergency Medicine

## 2023-06-15 ENCOUNTER — Other Ambulatory Visit: Payer: Self-pay

## 2023-06-15 ENCOUNTER — Encounter (HOSPITAL_COMMUNITY): Payer: Self-pay | Admitting: Emergency Medicine

## 2023-06-15 DIAGNOSIS — Y908 Blood alcohol level of 240 mg/100 ml or more: Secondary | ICD-10-CM | POA: Insufficient documentation

## 2023-06-15 DIAGNOSIS — E8729 Other acidosis: Secondary | ICD-10-CM

## 2023-06-15 DIAGNOSIS — E860 Dehydration: Secondary | ICD-10-CM | POA: Insufficient documentation

## 2023-06-15 DIAGNOSIS — F10129 Alcohol abuse with intoxication, unspecified: Secondary | ICD-10-CM | POA: Insufficient documentation

## 2023-06-15 DIAGNOSIS — R531 Weakness: Secondary | ICD-10-CM

## 2023-06-15 LAB — BASIC METABOLIC PANEL
Anion gap: 20 — ABNORMAL HIGH (ref 5–15)
BUN: 14 mg/dL (ref 8–23)
CO2: 20 mmol/L — ABNORMAL LOW (ref 22–32)
Calcium: 8.4 mg/dL — ABNORMAL LOW (ref 8.9–10.3)
Chloride: 101 mmol/L (ref 98–111)
Creatinine, Ser: 0.86 mg/dL (ref 0.61–1.24)
GFR, Estimated: >60 mL/min (ref >60)
Glucose, Bld: 75 mg/dL (ref 70–99)
Potassium: 3.4 mmol/L — ABNORMAL LOW (ref 3.5–5.1)
Sodium: 141 mmol/L (ref 135–145)

## 2023-06-15 LAB — CBC
HCT: 36.8 % — ABNORMAL LOW (ref 39.0–52.0)
Hemoglobin: 12.5 g/dL — ABNORMAL LOW (ref 13.0–17.0)
MCH: 34.2 pg — ABNORMAL HIGH (ref 26.0–34.0)
MCHC: 34 g/dL (ref 30.0–36.0)
MCV: 100.8 fL — ABNORMAL HIGH (ref 80.0–100.0)
Platelets: 152 10*3/uL (ref 150–400)
RBC: 3.65 MIL/uL — ABNORMAL LOW (ref 4.22–5.81)
RDW: 14.1 % (ref 11.5–15.5)
WBC: 4.9 10*3/uL (ref 4.0–10.5)
nRBC: 0 % (ref 0.0–0.2)

## 2023-06-15 LAB — CBG MONITORING, ED
Glucose-Capillary: 65 mg/dL — ABNORMAL LOW (ref 70–99)
Glucose-Capillary: 106 mg/dL — ABNORMAL HIGH (ref 70–99)

## 2023-06-15 LAB — I-STAT CG4 LACTIC ACID, ED: Lactic Acid, Venous: 7.5 mmol/L (ref 0.5–1.9)

## 2023-06-15 LAB — BETA-HYDROXYBUTYRIC ACID: Beta-Hydroxybutyric Acid: 1.08 mmol/L — ABNORMAL HIGH (ref 0.05–0.27)

## 2023-06-15 LAB — ETHANOL: Alcohol, Ethyl (B): 361 mg/dL (ref <10)

## 2023-06-15 MED ORDER — LORAZEPAM 1 MG PO TABS: 0.0000 mg | ORAL_TABLET | Freq: Four times a day (QID) | ORAL | Status: DC

## 2023-06-15 MED ORDER — LORAZEPAM 2 MG/ML IJ SOLN
1.0000 mg | Freq: Once | INTRAMUSCULAR | Status: AC
  Administered 2023-06-15: 1 mg via INTRAVENOUS
  Filled 2023-06-15: qty 1

## 2023-06-15 MED ORDER — THIAMINE HCL 100 MG/ML IJ SOLN
100.0000 mg | Freq: Once | INTRAMUSCULAR | Status: AC
Start: 2023-06-15 — End: 2023-06-15
  Administered 2023-06-15: 100 mg via INTRAVENOUS
  Filled 2023-06-15: qty 2

## 2023-06-15 MED ORDER — LORAZEPAM 2 MG/ML IJ SOLN: 0.0000 mg | Freq: Four times a day (QID) | INTRAMUSCULAR | Status: DC

## 2023-06-15 MED ORDER — LORAZEPAM 2 MG/ML IJ SOLN: 0.0000 mg | Freq: Two times a day (BID) | INTRAMUSCULAR | Status: DC

## 2023-06-15 MED ORDER — METOPROLOL TARTRATE 25 MG PO TABS: 25.0000 mg | ORAL_TABLET | Freq: Once | ORAL | Status: DC

## 2023-06-15 MED ORDER — KCL IN DEXTROSE-NACL 20-5-0.45 MEQ/L-%-% IV SOLN
INTRAVENOUS | Status: DC
  Filled 2023-06-15: qty 1000

## 2023-06-15 MED ORDER — LACTATED RINGERS IV BOLUS
1000.0000 mL | Freq: Once | INTRAVENOUS | Status: AC
  Administered 2023-06-15: 1000 mL via INTRAVENOUS

## 2023-06-15 MED ORDER — LORAZEPAM 1 MG PO TABS: 0.0000 mg | ORAL_TABLET | Freq: Two times a day (BID) | ORAL | Status: DC

## 2023-06-15 NOTE — ED Provider Notes (Addendum)
[place] EMERGENCY DEPARTMENT AT [facility] Provider Note   CSN: [id number] Arrival date & time: 06/15/23  1530     History Chief Complaint  Patient presents with   Weakness    HPI Taylor Myers is a 72 y.o. male presenting for chief complaint of weakness.  Initially patient states that he is been having syncopal events lightheadedness weakness and decreased appetite.  However the more I talk to the patient he reveals that the main reason he called the emergency department is that he has been drinking more heavily and felt like he could not control it any longer.  He states the syncopal event was that he got drunk and went to sleep. Woke up today with a headache and poor appetite and realized he needed help. History of approximately 16 drinks per day.  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 Denies any history of withdrawals.  States he missed his medicine this morning because he was intoxicated.   Patient's recorded medical, surgical, social, medication list and allergies were reviewed in the Snapshot window as part of the initial history.   Review of Systems   Review of Systems  Constitutional:  Negative for chills and fever.  HENT:  Negative for ear pain and sore throat.   Eyes:  Negative for pain and visual disturbance.  Respiratory:  Negative for cough and shortness of breath.   Cardiovascular:  Positive for palpitations. Negative for chest pain.  Gastrointestinal:  Negative for abdominal pain and vomiting.  Genitourinary:  Negative for dysuria and hematuria.  Musculoskeletal:  Negative for arthralgias and back pain.  Skin:  Negative for color change and rash.  Neurological:  Negative for seizures and syncope.  All other systems reviewed and are negative.   Physical Exam Updated Vital Signs BP 123/64   Pulse 93   Temp 98.2 F (36.8 C) (Oral)   Resp 19   Ht 5\' 9"  (1.753 m)   Wt 68 kg   SpO2 100%   BMI 22.15 kg/m  Physical Exam Vitals and nursing note  reviewed.  Constitutional:      General: He is not in acute distress.    Appearance: He is well-developed.  HENT:     Head: Normocephalic and atraumatic.  Eyes:     Conjunctiva/sclera: Conjunctivae normal.  Cardiovascular:     Rate and Rhythm: Normal rate and regular rhythm.  Pulmonary:     Effort: Pulmonary effort is normal. No respiratory distress.  Abdominal:     General: Abdomen is flat. There is no distension.  Musculoskeletal:        General: No swelling or deformity.  Skin:    General: Skin is warm and dry.     Capillary Refill: Capillary refill takes less than 2 seconds.  Neurological:     Mental Status: He is alert and oriented to person, place, and time. Mental status is at baseline.      ED Course/ Medical Decision Making/ A&P     Procedures .Critical Care  Performed by: Glyn Ade, MD Authorized by: Glyn Ade, MD   Critical care provider statement:    Critical care time (minutes):  45   Critical care was necessary to treat or prevent imminent or life-threatening deterioration of the following conditions:  Dehydration and metabolic crisis   Critical care was time spent personally by me on the following activities:  Development of treatment plan with patient or surrogate, discussions with consultants, evaluation of patient's response to treatment, examination of patient, ordering  and review of laboratory studies, ordering and review of radiographic studies, ordering and performing treatments and interventions, pulse oximetry, re-evaluation of patient's condition and review of old charts    Medications Ordered in ED Medications  LORazepam (ATIVAN) injection 0-4 mg ( Intravenous Not Given 06/15/23 1751)    Or  LORazepam (ATIVAN) tablet 0-4 mg ( Oral See Alternative 06/15/23 1751)  LORazepam (ATIVAN) injection 0-4 mg (has no administration in time range)    Or  LORazepam (ATIVAN) tablet 0-4 mg (has no administration in time range)  dextrose 5 %  and 0.45 % NaCl with KCl 20 mEq/L infusion ( Intravenous New Bag/Given 06/15/23 1935)  LORazepam (ATIVAN) injection 1 mg (1 mg Intravenous Given 06/15/23 1756)  thiamine (VITAMIN B1) injection 100 mg (100 mg Intravenous Given 06/15/23 1756)  lactated ringers bolus 1,000 mL (0 mLs Intravenous Stopped 06/15/23 1842)    Medical Decision Making:   72 year old male presenting with multiple chief complaints initially.  The more I talk to the patient the more that he revealed that the main reason he is here is that he wants to go through alcohol detox as he has been drinking 16 drinks per day and has been under significant psychosocial stress from his family at home.  He is in no acute distress he is ambulatory tolerating p.o. intake. Medically, labs were done in triage revealing no focal pathology.  Mild hypokalemia, anion gap and slight CO2 consistent with likely alcoholic ketosis. Reassessment: Lactic acid critically elevated, patient has already been treated with a bolus.  Given suspicion for alcoholic ketoacidosis, will treat with thiamine and dextrose containing fluids and arrange for admission to medicine. Reassessment: I was called back to bedside emergently, patient stated that he is leaving and is calling his ride home.  States that he has completely resolved and wants to go home as he has to take care of his elderly father. He denies fevers chills nausea vomiting any other symptoms this time.  States that he will follow-up in the outpatient setting for his psychiatric/alcohol abuse care. Attempted to redirect the patient that he had a critical lactic acidosis that this could be immediately life-threatening and patient expressed understanding stating that he understands the risk but states "I am not willing to let his dad die at home alone" Patient currently has capacity, alcohol elevated 5 hours ago but patient clinically sober and drinks very heavily every day and appears to be at his mental  status baseline at this time. Family has been called by nursing and they have accepted him back home and will assist him inside. Disposition:  Patient is requesting discharge at this time.  Given patient's understanding of risk of severe missed diagnosis based on limitations of today's evaluation and risk of interval worsening of disease including life or limb threatening pathology, will participate in shared medical decision making and patient directed discharge at this time.  Patient is welcome to return for further diagnostic evaluation/therapeutic management at any time.   Clinical Impression:  1. Weakness   2. Dehydration   3. Alcoholic ketoacidosis      Discharge   Final Clinical Impression(s) / ED Diagnoses Final diagnoses:  Weakness  Dehydration  Alcoholic ketoacidosis    Rx / DC Orders ED Discharge Orders     None         Glyn Ade, MD 06/15/23 2121    Glyn Ade, MD 06/15/23 2130

## 2023-06-15 NOTE — ED Triage Notes (Signed)
Pt BIB EMS for generalized weakness and pain since yesterday. States he has been increasing his workouts for the past week doing sit ups and push ups. ETOH on board. Pt states he also "fainted" last night. States his back, arms, and shoulders have been in a lot of pain. Denies falling with the syncopal episode. 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

## 2023-06-15 NOTE — ED Provider Triage Note (Signed)
Emergency Medicine Provider Triage Evaluation Note  Taylor Myers , a 72 y.o. male  was evaluated in triage.  Pt complains of fatigue. Pt is a poor historian.  He report he has chronic neck and back pain for years.  Was seen by pain management in the past and the medications that they gave cause him to go crazy.  He doesn't want to take any medications.  He also report he lives with his father who is 66 and he drives him crazy.  He just want to get away for a bit.  Admits to alcohol use.  Felt a bit lightheaded last night.  No cp, sob, focal numbness/weakness.   Review of Systems  Positive: As above Negative: As above  Physical Exam  BP (!) 144/79 (BP Location: Right Arm)   Pulse (!) 126   Temp 98.2 F (36.8 C) (Oral)   Resp (!) 21   Ht 5\' 9"  (1.753 m)   Wt 68 kg   SpO2 98%   BMI 22.15 kg/m  Gen:   Awake, no distress   Resp:  Normal effort  MSK:   Moves extremities without difficulty  Other:  tachycardic  Medical Decision Making  Medically screening exam initiated at 3:49 PM.  Appropriate orders placed.  Heinz Fago was informed that the remainder of the evaluation will be completed by another provider, this initial triage assessment does not replace that evaluation, and the importance of remaining in the ED until their evaluation is complete.  Request nurse to move pt to next available room   Fayrene Helper, PA-C 06/15/23 1555

## 2023-06-15 NOTE — ED Notes (Signed)
Pt given ginger ale for CBG 65. Offered food, pt states he does not want to eat right now.

## 2023-06-27 ENCOUNTER — Observation Stay (HOSPITAL_COMMUNITY)
Admission: EM | Admit: 2023-06-27 | Discharge: 2023-06-30 | Disposition: A | Discharge status: Home Health | Payer: Medicare Other | Attending: Family Medicine | Admitting: Family Medicine

## 2023-06-27 ENCOUNTER — Emergency Department (HOSPITAL_COMMUNITY): Payer: Medicare Other

## 2023-06-27 DIAGNOSIS — H16239 Neurotrophic keratoconjunctivitis, unspecified eye: Secondary | ICD-10-CM | POA: Insufficient documentation

## 2023-06-27 DIAGNOSIS — Z96643 Presence of artificial hip joint, bilateral: Secondary | ICD-10-CM | POA: Insufficient documentation

## 2023-06-27 DIAGNOSIS — Z1152 Encounter for screening for COVID-19: Secondary | ICD-10-CM | POA: Diagnosis not present

## 2023-06-27 DIAGNOSIS — I10 Essential (primary) hypertension: Secondary | ICD-10-CM | POA: Insufficient documentation

## 2023-06-27 DIAGNOSIS — Z87891 Personal history of nicotine dependence: Secondary | ICD-10-CM | POA: Insufficient documentation

## 2023-06-27 DIAGNOSIS — J45909 Unspecified asthma, uncomplicated: Secondary | ICD-10-CM | POA: Insufficient documentation

## 2023-06-27 DIAGNOSIS — R7989 Other specified abnormal findings of blood chemistry: Secondary | ICD-10-CM | POA: Diagnosis not present

## 2023-06-27 DIAGNOSIS — R079 Chest pain, unspecified: Secondary | ICD-10-CM | POA: Diagnosis not present

## 2023-06-27 DIAGNOSIS — I4891 Unspecified atrial fibrillation: Secondary | ICD-10-CM | POA: Diagnosis not present

## 2023-06-27 DIAGNOSIS — I482 Chronic atrial fibrillation, unspecified: Secondary | ICD-10-CM | POA: Insufficient documentation

## 2023-06-27 DIAGNOSIS — Z96653 Presence of artificial knee joint, bilateral: Secondary | ICD-10-CM | POA: Diagnosis not present

## 2023-06-27 DIAGNOSIS — Z9889 Other specified postprocedural states: Secondary | ICD-10-CM | POA: Diagnosis not present

## 2023-06-27 DIAGNOSIS — N390 Urinary tract infection, site not specified: Secondary | ICD-10-CM | POA: Diagnosis not present

## 2023-06-27 DIAGNOSIS — Z7901 Long term (current) use of anticoagulants: Secondary | ICD-10-CM | POA: Diagnosis not present

## 2023-06-27 DIAGNOSIS — R0789 Other chest pain: Secondary | ICD-10-CM | POA: Diagnosis present

## 2023-06-27 DIAGNOSIS — Z79899 Other long term (current) drug therapy: Secondary | ICD-10-CM | POA: Diagnosis not present

## 2023-06-27 DIAGNOSIS — F1010 Alcohol abuse, uncomplicated: Secondary | ICD-10-CM | POA: Diagnosis not present

## 2023-06-27 DIAGNOSIS — R5383 Other fatigue: Secondary | ICD-10-CM | POA: Diagnosis not present

## 2023-06-27 LAB — CBC
HCT: 41.9 % (ref 39.0–52.0)
Hemoglobin: 14.1 g/dL (ref 13.0–17.0)
MCH: 34 pg (ref 26.0–34.0)
MCHC: 33.7 g/dL (ref 30.0–36.0)
MCV: 101 fL — ABNORMAL HIGH (ref 80.0–100.0)
Platelets: 248 10*3/uL (ref 150–400)
RBC: 4.15 MIL/uL — ABNORMAL LOW (ref 4.22–5.81)
RDW: 14.1 % (ref 11.5–15.5)
WBC: 3.8 10*3/uL — ABNORMAL LOW (ref 4.0–10.5)
nRBC: 0 % (ref 0.0–0.2)

## 2023-06-27 LAB — BASIC METABOLIC PANEL
Anion gap: 17 — ABNORMAL HIGH (ref 5–15)
BUN: 7 mg/dL — ABNORMAL LOW (ref 8–23)
CO2: 24 mmol/L (ref 22–32)
Calcium: 8.4 mg/dL — ABNORMAL LOW (ref 8.9–10.3)
Chloride: 103 mmol/L (ref 98–111)
Creatinine, Ser: 0.79 mg/dL (ref 0.61–1.24)
GFR, Estimated: >60 mL/min (ref >60)
Glucose, Bld: 84 mg/dL (ref 70–99)
Potassium: 3.6 mmol/L (ref 3.5–5.1)
Sodium: 144 mmol/L (ref 135–145)

## 2023-06-27 LAB — URINALYSIS, ROUTINE W REFLEX MICROSCOPIC
Bilirubin Urine: NEGATIVE
Glucose, UA: NEGATIVE mg/dL
Hgb urine dipstick: NEGATIVE
Ketones, ur: NEGATIVE mg/dL
Nitrite: NEGATIVE
Protein, ur: 30 mg/dL — AB
Specific Gravity, Urine: 1.012 (ref 1.005–1.030)
WBC, UA: >50 WBC/hpf (ref 0–5)
pH: 5 (ref 5.0–8.0)

## 2023-06-27 LAB — TROPONIN I (HIGH SENSITIVITY)
Troponin I (High Sensitivity): 14 ng/L (ref <18)
Troponin I (High Sensitivity): 13 ng/L (ref <18)

## 2023-06-27 LAB — ETHANOL: Alcohol, Ethyl (B): 161 mg/dL — ABNORMAL HIGH (ref <10)

## 2023-06-27 MED ORDER — HYDROCODONE-ACETAMINOPHEN 5-325 MG PO TABS
1.0000 | ORAL_TABLET | Freq: Once | ORAL | Status: AC
  Administered 2023-06-27: 1 via ORAL
  Filled 2023-06-27: qty 1

## 2023-06-27 MED ORDER — POTASSIUM CHLORIDE 20 MEQ PO PACK
20.0000 meq | PACK | Freq: Once | ORAL | Status: AC
  Administered 2023-06-27: 20 meq via ORAL
  Filled 2023-06-27: qty 1

## 2023-06-27 MED ORDER — APIXABAN 5 MG PO TABS
5.0000 mg | ORAL_TABLET | Freq: Two times a day (BID) | ORAL | Status: DC
  Administered 2023-06-27 – 2023-06-30 (×6): 5 mg via ORAL
  Filled 2023-06-27 (×6): qty 1

## 2023-06-27 MED ORDER — METOPROLOL TARTRATE 25 MG PO TABS
25.0000 mg | ORAL_TABLET | Freq: Two times a day (BID) | ORAL | Status: DC
  Administered 2023-06-27 – 2023-06-29 (×5): 25 mg via ORAL
  Filled 2023-06-27 (×6): qty 1

## 2023-06-27 MED ORDER — ACETAMINOPHEN 500 MG PO TABS: 500.0000 mg | ORAL_TABLET | Freq: Two times a day (BID) | ORAL | Status: DC | PRN

## 2023-06-27 MED ORDER — TAMSULOSIN HCL 0.4 MG PO CAPS
0.4000 mg | ORAL_CAPSULE | Freq: Every day | ORAL | Status: DC
  Administered 2023-06-27 – 2023-06-30 (×4): 0.4 mg via ORAL
  Filled 2023-06-27 (×4): qty 1

## 2023-06-27 MED ORDER — SORBITOL 70 % SOLN: 30.0000 mL | Freq: Every day | Status: DC | PRN

## 2023-06-27 MED ORDER — ACETAMINOPHEN 650 MG RE SUPP: 650.0000 mg | Freq: Four times a day (QID) | RECTAL | Status: DC | PRN

## 2023-06-27 MED ORDER — ADULT MULTIVITAMIN W/MINERALS CH
1.0000 | ORAL_TABLET | Freq: Every day | ORAL | Status: DC
  Administered 2023-06-27 – 2023-06-30 (×4): 1 via ORAL
  Filled 2023-06-27 (×4): qty 1

## 2023-06-27 MED ORDER — MELATONIN 3 MG PO TABS
3.0000 mg | ORAL_TABLET | Freq: Every evening | ORAL | Status: DC | PRN
  Administered 2023-06-28 – 2023-06-29 (×2): 3 mg via ORAL
  Filled 2023-06-27 (×2): qty 1

## 2023-06-27 MED ORDER — LORAZEPAM 1 MG PO TABS: 1.0000 mg | ORAL_TABLET | ORAL | Status: DC | PRN

## 2023-06-27 MED ORDER — THIAMINE HCL 100 MG/ML IJ SOLN
100.0000 mg | Freq: Every day | INTRAMUSCULAR | Status: DC
  Filled 2023-06-27: qty 2

## 2023-06-27 MED ORDER — DILTIAZEM HCL-DEXTROSE 125-5 MG/125ML-% IV SOLN (PREMIX): 5.0000 mg/h | INTRAVENOUS | Status: DC

## 2023-06-27 MED ORDER — ONDANSETRON HCL 4 MG/2ML IJ SOLN: 4.0000 mg | Freq: Four times a day (QID) | INTRAMUSCULAR | Status: DC | PRN

## 2023-06-27 MED ORDER — THIAMINE HCL 100 MG/ML IJ SOLN
100.0000 mg | Freq: Every day | INTRAMUSCULAR | Status: DC
  Administered 2023-06-27: 100 mg via INTRAVENOUS
  Filled 2023-06-27: qty 2

## 2023-06-27 MED ORDER — THIAMINE MONONITRATE 100 MG PO TABS
100.0000 mg | ORAL_TABLET | Freq: Every day | ORAL | Status: DC
  Administered 2023-06-28 – 2023-06-30 (×3): 100 mg via ORAL
  Filled 2023-06-27 (×3): qty 1

## 2023-06-27 MED ORDER — DILTIAZEM HCL 25 MG/5ML IV SOLN
10.0000 mg | Freq: Once | INTRAVENOUS | Status: AC
  Administered 2023-06-27: 10 mg via INTRAVENOUS
  Filled 2023-06-27: qty 5

## 2023-06-27 MED ORDER — LORAZEPAM 2 MG/ML IJ SOLN: 1.0000 mg | INTRAMUSCULAR | Status: DC | PRN

## 2023-06-27 MED ORDER — FOLIC ACID 1 MG PO TABS
1.0000 mg | ORAL_TABLET | Freq: Every day | ORAL | Status: DC
  Administered 2023-06-27 – 2023-06-30 (×4): 1 mg via ORAL
  Filled 2023-06-27 (×3): qty 1

## 2023-06-27 MED ORDER — ONDANSETRON HCL 4 MG PO TABS: 4.0000 mg | ORAL_TABLET | Freq: Four times a day (QID) | ORAL | Status: DC | PRN

## 2023-06-27 NOTE — ED Notes (Signed)
Pt ambulatory to restroom

## 2023-06-27 NOTE — ED Provider Notes (Signed)
[place] EMERGENCY DEPARTMENT AT [facility] Provider Note   CSN: [id number] Arrival date & time: 06/27/23  1601     History  Chief Complaint  Patient presents with   Chest Pain    Taylor Myers is a 72 y.o. male.  72 year old male with past medical history of permanent atrial fibrillation, hypertension, hyperlipidemia, and BPH presenting to the emergency department today with chest pain.  They states he has been having sharp chest pain going to his shoulder this been going on for the past 3 to 4 days.  It is intermittent in character.  He denies any associated fevers.  He states that he does have some occasional diaphoresis with this.  He denies any associated nausea.  He states that is somewhat positional in nature.  He denies any hemoptysis.  Denies any new leg pain or swelling.  Denies a history of DVT or pulmonary embolism, recent surgeries, recent travel.   Chest Pain      Home Medications Prior to Admission medications   Medication Sig Start Date End Date Taking? Authorizing Provider  apixaban (ELIQUIS) 5 MG TABS tablet Take 1 tablet (5 mg total) by mouth 2 (two) times daily. 05/12/23  Yes Little Ishikawa, MD  DHA-EPA-Flaxseed Oil-Vitamin E [facility] NUTRITION) CAPS Take 1 capsule by mouth daily.   Yes [provider]  ibuprofen (ADVIL) 600 MG tablet Take 600 mg by mouth every 6 (six) hours as needed for moderate pain (pain score 4-6).   Yes [provider]  Multiple Vitamins-Minerals (LIVER DETOX PO) Take 2 tablets by mouth daily.   Yes [provider]  NON FORMULARY Take 2 capsules by mouth See admin instructions. Beet extract capsules- Take 2 capsules by mouth once a day   Yes [provider]  tamsulosin (FLOMAX) 0.4 MG CAPS capsule Take 0.4 mg by mouth daily.   Yes [provider]  TURMERIC PO Take 1 capsule by mouth daily. Patient not taking: Reported on 06/27/2023    [provider]       Allergies    Patient has no known allergies.    Review of Systems   Review of Systems  Cardiovascular:  Positive for chest pain.  All other systems reviewed and are negative.   Physical Exam Updated Vital Signs BP (!) 142/95   Pulse (!) 101   Temp 98.7 F (37.1 C) (Oral)   Resp 17   SpO2 100%  Physical Exam Vitals and nursing note reviewed.   Gen: NAD Eyes: PERRL, EOMI HEENT: no oropharyngeal swelling Neck: trachea midline Resp: clear to auscultation bilaterally Card: Tachycardic, irregular, no murmurs, rubs, or gallops Abd: nontender, nondistended Extremities: no calf tenderness, no edema Vascular: 2+ radial pulses bilaterally, 2+ DP pulses bilaterally Neuro: No focal deficits Skin: no rashes Psyc: acting appropriately   ED Results / Procedures / Treatments   Labs (all labs ordered are listed, but only abnormal results are displayed) Labs Reviewed  BASIC METABOLIC PANEL - Abnormal; Notable for the following components:      Result Value   BUN 7 (*)    Calcium 8.4 (*)    Anion gap 17 (*)    All other components within normal limits  CBC - Abnormal; Notable for the following components:   WBC 3.8 (*)    RBC 4.15 (*)    MCV 101.0 (*)    All other components within normal limits  URINALYSIS, ROUTINE W REFLEX MICROSCOPIC  TROPONIN I (HIGH SENSITIVITY)  TROPONIN I (  HIGH SENSITIVITY)    EKG EKG Interpretation Date/Time:  Monday June 27 2023 16:24:28 EST Ventricular Rate:  103 PR Interval:    QRS Duration:  140 QT Interval:  400 QTC Calculation: 524 R Axis:   -86  Text Interpretation: Atrial fibrillation with rapid ventricular response with premature ventricular or aberrantly conducted complexes Right bundle branch block Left anterior fascicular block Bifascicular block Anterior infarct , age undetermined Abnormal ECG When compared with ECG of 15-Jun-2023 15:17, PREVIOUS ECG IS PRESENT Confirmed by Beckey Downing [id number]) on 06/27/2023 5:01:10  PM  Radiology DG Chest 2 View  Result Date: 06/27/2023 CLINICAL DATA:  Chest pain. EXAM: CHEST - 2 VIEW COMPARISON:  Chest radiograph dated 05/29/2023. FINDINGS: Left lung base scarring. No focal consolidation, pleural effusion, or pneumothorax. Stable enlargement of the cardiac silhouette. No acute osseous pathology. Degenerative changes of the spine and shoulders. IMPRESSION: 1. No active cardiopulmonary disease. 2. Cardiomegaly. Electronically Signed   By: Elgie Collard M.D.   On: 06/27/2023 19:12    Procedures Procedures    Medications Ordered in ED Medications  diltiazem (CARDIZEM) 125 mg in dextrose 5% 125 mL (1 mg/mL) infusion (has no administration in time range)  HYDROcodone-acetaminophen (NORCO/VICODIN) 5-325 MG per tablet 1 tablet (1 tablet Oral Given 06/27/23 1735)  diltiazem (CARDIZEM) injection 10 mg (10 mg Intravenous Given 06/27/23 1735)    ED Course/ Medical Decision Making/ A&P                                 Medical Decision Making 72 year old male with past medical history of hypertension, hyperlipidemia, and appropriate atrial fibrillation noted presenting to the emergency department today with chest pain.  I will further evaluate the patient here with basic labs Wels and EKG, chest x-ray, and troponin for further evaluation for ACS, pulmonary edema, pulmonary infiltrates, pneumothorax.  The patient is in atrial fibrillation with RVR in the room with a rate anywhere from 100 to the 120s.  I will give the patient Cardizem for this.  Patient is anticoagulated and based on description of his symptom suspicion for pulmonary embolism is low at this time given his pulse ox of 100% and no complaints of shortness of breath.  I will reevaluate for ultimate disposition.  The patient's EKG interpreted by me shows a atrial fibrillation with right bundle branch block and left anterior fascicular block with RVR with nonspecific ST-T changes.  After my initial evaluation of the  patient I did go in and reassess after looking at the nursing triage note that was not in during my initial evaluation.  There were reports of leg pain and flank pain.  In speaking with the patient he reports that the symptoms started after he did "100 sit ups".  He states that he was having some soreness in his back and this has improved.  This is very reproducible on exam with palpation of his right and left thoracic paraspinal region.  He states that his chest discomfort has now improved as well after he received the Cardizem.  The patient's heart rate did come down.  It started to creep back up in the room and is in the 110's to 120.  I start the patient on Cardizem infusion.  Calls placed to hospitalist service for admission.  He has equal pulses bilaterally and the symptoms resolving now suspicion for aortic pathology is low at this time.  Critical care time 36 minutes including reassessments,  review of old records, treatment of atrial fibrillation with RVR with IV diltiazem bolus and infusion, coordination care with hospitalist service  Amount and/or Complexity of Data Reviewed Labs: ordered. Radiology: ordered.  Risk Prescription drug management.           Final Clinical Impression(s) / ED Diagnoses Final diagnoses:  Atrial fibrillation with RVR (HCC)  Chest pain, unspecified type    Rx / DC Orders ED Discharge Orders     None         Durwin Glaze, MD 06/27/23 2000

## 2023-06-27 NOTE — ED Triage Notes (Addendum)
Pt to ED via [facility] from home. Pt states he woke up this morning around 4:30am with stabbing CP left chest radiating to left flank. Pt states he recently started exercising and did chest exercises yesterday (push ups). Pt also reports sleeping on his side. Pt also c/o groin pain and bilateral leg pain since yesterday. Pt denies any known injury to either. Pt denies any urinary s/s. No redness or swelling to legs. Pt was in a fib RVR with EMS with a range of 80-140 HR. Pt has hx of same.   EMS: 133/81 78 HR 18 RR 97% RA

## 2023-06-27 NOTE — ED Notes (Signed)
Main Lab called to add urine drug screen order to urine previously sent down

## 2023-06-27 NOTE — H&P (Signed)
History and Physical    Patient: Taylor Myers KZS:[id number] DOB: Jul 16, 1951 DOA: 06/27/2023 DOS: the patient was seen and examined on 06/27/2023 PCP: Tisovec, Adelfa Koh, MD  Patient coming from: Home  Chief Complaint:  Chief Complaint  Patient presents with   Chest Pain   HPI: Taylor Myers is a 72 y.o. male with medical history significant for chronic atrial fibrillation on Eliquis and heavy alcohol use up until 2 weeks ago.  He says he does 100 push-ups every other day.  Yesterday he did his usual workout and this morning about 4 AM he was awakened with sharp stabbing chest pains.  He definitely felt that he may be did too much exercise, but the pains were severe and would not go away so he presented for evaluation.  He denies fevers chills or shortness of breath.  He had a little bit of nausea but no vomiting or diarrhea.  In the emergency department he was found to be in A-fib with RVR rate in the 120s to 130s.  His heart rate improved after being given IV Cardizem and the plan was to start the patient on a Cardizem drip. His chest pain went away when his heart rate came down.  The patient never did have the palpitations.  He has been compliant with his Eliquis and all of his other medications.  The hospitalist were asked to admit for continued care.  His troponins have remained low.     Review of Systems: As mentioned in the history of present illness. All other systems reviewed and are negative. Past Medical History:  Diagnosis Date   A-fib (HCC)    Alcohol abuse, in remission    Arthritis    Asthma    child hood asthma   History of kidney stones    "dormant kidney stone"   Hypertension    Wheelchair dependent    Past Surgical History:  Procedure Laterality Date   ANKLE SURGERY Right    APPLICATION OF WOUND VAC Right 03/19/2022   Procedure: APPLICATION OF WOUND VAC;  Surgeon: Tarry Kos, MD;  Location: [facility] OR;  Service: Orthopedics;  Laterality: Right;   HIP SURGERY Right     total replacement   KNEE SURGERY     bilateral replacements   ORIF HUMERUS FRACTURE Right 03/19/2022   Procedure: OPEN REDUCTION INTERNAL FIXATION (ORIF) RIGHT DISTAL HUMERUS FRACTURE;  Surgeon: Tarry Kos, MD;  Location: [facility] OR;  Service: Orthopedics;  Laterality: Right;   Social History:  reports that he has quit smoking. His smoking use included cigarettes. He has never used smokeless tobacco. He reports current alcohol use of about 2.0 standard drinks of alcohol per week. He reports that he does not currently use drugs.  No Known Allergies  No family history on file.  Prior to Admission medications   Medication Sig Start Date End Date Taking? Authorizing Provider  apixaban (ELIQUIS) 5 MG TABS tablet Take 1 tablet (5 mg total) by mouth 2 (two) times daily. 05/12/23  Yes Little Ishikawa, MD  DHA-EPA-Flaxseed Oil-Vitamin E [facility] NUTRITION) CAPS Take 1 capsule by mouth daily.   Yes [provider]  ibuprofen (ADVIL) 600 MG tablet Take 600 mg by mouth every 6 (six) hours as needed for moderate pain (pain score 4-6).   Yes [provider]  Multiple Vitamins-Minerals (LIVER DETOX PO) Take 2 tablets by mouth daily.   Yes [provider]  NON FORMULARY Take 2 capsules by mouth See admin instructions. Beet extract capsules-  Take 2 capsules by mouth once a day   Yes [provider]  tamsulosin (FLOMAX) 0.4 MG CAPS capsule Take 0.4 mg by mouth daily.   Yes [provider]  TURMERIC PO Take 1 capsule by mouth daily. Patient not taking: Reported on 06/27/2023    [provider]    Physical Exam: Vitals:   06/27/23 1945 06/27/23 1958 06/27/23 2003 06/27/23 2009  BP:      Pulse:  (!) 101 99 (!) 103  Resp:  17 15 14   Temp: 98.7 F (37.1 C)     TempSrc: Oral     SpO2:  100% 92% 100%   Physical Exam:  General: No acute distress, disheveled HEENT: Normocephalic, right eye lid and cornea are swollen and right pupil is  clouded Cardiovascular: Tachycardic and irregular. Distal pulses faint. Pulmonary: Normal pulmonary effort, normal breath sounds Gastrointestinal: Nondistended abdomen, soft, non-tender, normoactive bowel sounds Musculoskeletal:No lower ext edema Skin: Skin is warm and dry. Neuro: No focal deficits noted, AAOx3. PSYCH: Attentive and cooperative  Data Reviewed:  Results for orders placed or performed during the hospital encounter of 06/27/23 (from the past 24 hour(s))  Basic metabolic panel     Status: Abnormal   Collection Time: 06/27/23  4:45 PM  Result Value Ref Range   Sodium 144 135 - 145 mmol/L   Potassium 3.6 3.5 - 5.1 mmol/L   Chloride 103 98 - 111 mmol/L   CO2 24 22 - 32 mmol/L   Glucose, Bld 84 70 - 99 mg/dL   BUN 7 (L) 8 - 23 mg/dL   Creatinine, Ser 0.98 0.61 - 1.24 mg/dL   Calcium 8.4 (L) 8.9 - 10.3 mg/dL   GFR, Estimated >11 >91 mL/min   Anion gap 17 (H) 5 - 15  CBC     Status: Abnormal   Collection Time: 06/27/23  4:45 PM  Result Value Ref Range   WBC 3.8 (L) 4.0 - 10.5 K/uL   RBC 4.15 (L) 4.22 - 5.81 MIL/uL   Hemoglobin 14.1 13.0 - 17.0 g/dL   HCT 47.8 29.5 - 62.1 %   MCV 101.0 (H) 80.0 - 100.0 fL   MCH 34.0 26.0 - 34.0 pg   MCHC 33.7 30.0 - 36.0 g/dL   RDW 30.8 65.7 - 84.6 %   Platelets 248 150 - 400 K/uL   nRBC 0.0 0.0 - 0.2 %  Troponin I (High Sensitivity)     Status: None   Collection Time: 06/27/23  4:45 PM  Result Value Ref Range   Troponin I (High Sensitivity) 14 <18 ng/L  Troponin I (High Sensitivity)     Status: None   Collection Time: 06/27/23  6:45 PM  Result Value Ref Range   Troponin I (High Sensitivity) 13 <18 ng/L     Assessment and Plan: A-fib with RVR in a patient with known A-fib but not on requiring for rate control previously.  He is on Eliquis daily. -P.o. metoprolol started.  IV Cardizem worked very well for him in the emergency department. History of alcohol abuse- the patient says he has not had any alcohol in over 2 weeks.   Will put him on CIWA protocol just for monitoring. Chest pain- He describes the pain is musculoskeletal but the pain went away when he was given Cardizem to slow his heart rate down.  Will continue to cycle cardiac enzymes and follow.    Advance Care Planning:   Code Status: Prior the patient wants to be full code  and names his niece and nephew is his surrogate decision makers their names are Jonny Ruiz and Angelique Blonder DiFillipi  Consults: none  Family Communication: none  Severity of Illness: The appropriate patient status for this patient is INPATIENT. Inpatient status is judged to be reasonable and necessary in order to provide the required intensity of service to ensure the patient's safety. The patient's presenting symptoms, physical exam findings, and initial radiographic and laboratory data in the context of their chronic comorbidities is felt to place them at high risk for further clinical deterioration. Furthermore, it is not anticipated that the patient will be medically stable for discharge from the hospital within 2 midnights of admission.   * I certify that at the point of admission it is my clinical judgment that the patient will require inpatient hospital care spanning beyond 2 midnights from the point of admission due to high intensity of service, high risk for further deterioration and high frequency of surveillance required.*  Author: Buena Irish, MD 06/27/2023 8:19 PM  For on call review [email or web address].

## 2023-06-28 ENCOUNTER — Other Ambulatory Visit: Payer: Self-pay

## 2023-06-28 DIAGNOSIS — I4891 Unspecified atrial fibrillation: Secondary | ICD-10-CM | POA: Diagnosis not present

## 2023-06-28 LAB — SARS CORONAVIRUS 2 BY RT PCR: SARS Coronavirus 2 by RT PCR: NEGATIVE

## 2023-06-28 LAB — COMPREHENSIVE METABOLIC PANEL
ALT: 61 U/L — ABNORMAL HIGH (ref 0–44)
AST: 88 U/L — ABNORMAL HIGH (ref 15–41)
Albumin: 3.2 g/dL — ABNORMAL LOW (ref 3.5–5.0)
Alkaline Phosphatase: 68 U/L (ref 38–126)
Anion gap: 13 (ref 5–15)
BUN: 7 mg/dL — ABNORMAL LOW (ref 8–23)
CO2: 25 mmol/L (ref 22–32)
Calcium: 8.1 mg/dL — ABNORMAL LOW (ref 8.9–10.3)
Chloride: 101 mmol/L (ref 98–111)
Creatinine, Ser: 0.77 mg/dL (ref 0.61–1.24)
GFR, Estimated: >60 mL/min (ref >60)
Glucose, Bld: 112 mg/dL — ABNORMAL HIGH (ref 70–99)
Potassium: 3.4 mmol/L — ABNORMAL LOW (ref 3.5–5.1)
Sodium: 139 mmol/L (ref 135–145)
Total Bilirubin: 2.6 mg/dL — ABNORMAL HIGH (ref <1.2)
Total Protein: 6 g/dL — ABNORMAL LOW (ref 6.5–8.1)

## 2023-06-28 LAB — RAPID URINE DRUG SCREEN, HOSP PERFORMED
Amphetamines: NOT DETECTED
Barbiturates: NOT DETECTED
Benzodiazepines: NOT DETECTED
Cocaine: NOT DETECTED
Opiates: NOT DETECTED
Tetrahydrocannabinol: POSITIVE — AB

## 2023-06-28 LAB — MAGNESIUM: Magnesium: 1.1 mg/dL — ABNORMAL LOW (ref 1.7–2.4)

## 2023-06-28 LAB — MRSA NEXT GEN BY PCR, NASAL: MRSA by PCR Next Gen: NOT DETECTED

## 2023-06-28 LAB — CK: Total CK: 53 U/L (ref 49–397)

## 2023-06-28 MED ORDER — MAGNESIUM SULFATE 4 GM/100ML IV SOLN
4.0000 g | Freq: Once | INTRAVENOUS | Status: AC
  Administered 2023-06-28: 4 g via INTRAVENOUS
  Filled 2023-06-28: qty 100

## 2023-06-28 MED ORDER — POTASSIUM CHLORIDE CRYS ER 20 MEQ PO TBCR
40.0000 meq | EXTENDED_RELEASE_TABLET | ORAL | Status: AC
  Administered 2023-06-28 (×2): 40 meq via ORAL
  Filled 2023-06-28 (×2): qty 2

## 2023-06-28 NOTE — ED Notes (Signed)
NAD noted at this time, pt resting in bed, respirations even and unlabored, skin warm and dry, bed in low position, call light within reach. Will continue to monitor. Pt is sleeping, NSR noted on monitor.

## 2023-06-28 NOTE — ED Notes (Signed)
ED TO INPATIENT HANDOFF REPORT  ED Nurse Name and Phone #: Loran Senters  S Name/Age/Gender Taylor Myers 72 y.o. male Room/Bed: 040C/040C  Code Status   Code Status: Full Code  Home/SNF/Other Home Patient oriented to: self, place, time, and situation Is this baseline? Yes   Triage Complete: Triage complete  Chief Complaint Atrial fibrillation with RVR (HCC) [I48.91]  Triage Note Pt to ED via [facility] from home. Pt states he woke up this morning around 4:30am with stabbing CP left chest radiating to left flank. Pt states he recently started exercising and did chest exercises yesterday (push ups). Pt also reports sleeping on his side. Pt also c/o groin pain and bilateral leg pain since yesterday. Pt denies any known injury to either. Pt denies any urinary s/s. No redness or swelling to legs. Pt was in a fib RVR with EMS with a range of 80-140 HR. Pt has hx of same.   EMS: 133/81 78 HR 18 RR 97% RA    Allergies No Known Allergies  Level of Care/Admitting Diagnosis ED Disposition     ED Disposition  Admit   Condition  --   Comment  Hospital Area: [facility] [[id number]]  Level of Care: Progressive [102]  Admit to Progressive based on following criteria: CARDIOVASCULAR & THORACIC of moderate stability with acute coronary syndrome symptoms/low risk myocardial infarction/hypertensive urgency/arrhythmias/heart failure potentially compromising stability and stable post cardiovascular intervention patients.  May admit patient to [facility] or [facility] if equivalent level of care is available:: No  Covid Evaluation: Asymptomatic - no recent exposure (last 10 days) testing not required  Diagnosis: Atrial fibrillation with RVR [place]) [[id number]]  Admitting Physician: Buena Irish [[id number]]  Attending Physician: Buena Irish [phone]  Certification:: I certify this patient will need inpatient services for at least 2 midnights  Expected Medical Readiness:  06/29/2023          B Medical/Surgery History Past Medical History:  Diagnosis Date   A-fib (HCC)    Alcohol abuse, in remission    Arthritis    Asthma    child hood asthma   History of kidney stones    "dormant kidney stone"   Hypertension    Wheelchair dependent    Past Surgical History:  Procedure Laterality Date   ANKLE SURGERY Right    APPLICATION OF WOUND VAC Right 03/19/2022   Procedure: APPLICATION OF WOUND VAC;  Surgeon: Tarry Kos, MD;  Location: [facility] OR;  Service: Orthopedics;  Laterality: Right;   HIP SURGERY Right    total replacement   KNEE SURGERY     bilateral replacements   ORIF HUMERUS FRACTURE Right 03/19/2022   Procedure: OPEN REDUCTION INTERNAL FIXATION (ORIF) RIGHT DISTAL HUMERUS FRACTURE;  Surgeon: Tarry Kos, MD;  Location: [facility] OR;  Service: Orthopedics;  Laterality: Right;     A IV Location/Drains/Wounds Patient Lines/Drains/Airways Status     Active Line/Drains/Airways     Name Placement date Placement time Site Days   Peripheral IV 06/27/23 18 G Left Antecubital 06/27/23  1735  Antecubital  1            Intake/Output Last 24 hours  Intake/Output Summary (Last 24 hours) at 06/28/2023 1219 Last data filed at 06/28/2023 0745 Gross per 24 hour  Intake --  Output 100 ml  Net -100 ml    Labs/Imaging Results for orders placed or performed during the hospital encounter of 06/27/23 (from the past 48 hour(s))  Basic metabolic panel  Status: Abnormal   Collection Time: 06/27/23  4:45 PM  Result Value Ref Range   Sodium 144 135 - 145 mmol/L   Potassium 3.6 3.5 - 5.1 mmol/L   Chloride 103 98 - 111 mmol/L   CO2 24 22 - 32 mmol/L   Glucose, Bld 84 70 - 99 mg/dL    Comment: Glucose reference range applies only to samples taken after fasting for at least 8 hours.   BUN 7 (L) 8 - 23 mg/dL   Creatinine, Ser 6.29 0.61 - 1.24 mg/dL   Calcium 8.4 (L) 8.9 - 10.3 mg/dL   GFR, Estimated >52 >84 mL/min    Comment: (NOTE) Calculated using  the CKD-EPI Creatinine Equation (2021)    Anion gap 17 (H) 5 - 15    Comment: Performed at [facility], [address], [place], [place] [address]  CBC     Status: Abnormal   Collection Time: 06/27/23  4:45 PM  Result Value Ref Range   WBC 3.8 (L) 4.0 - 10.5 K/uL   RBC 4.15 (L) 4.22 - 5.81 MIL/uL   Hemoglobin 14.1 13.0 - 17.0 g/dL   HCT 01.0 27.2 - 53.6 %   MCV 101.0 (H) 80.0 - 100.0 fL   MCH 34.0 26.0 - 34.0 pg   MCHC 33.7 30.0 - 36.0 g/dL   RDW 64.4 03.4 - 74.2 %   Platelets 248 150 - 400 K/uL   nRBC 0.0 0.0 - 0.2 %    Comment: Performed at [facility], [address], [place], [place] [address]  Troponin I (High Sensitivity)     Status: None   Collection Time: 06/27/23  4:45 PM  Result Value Ref Range   Troponin I (High Sensitivity) 14 <18 ng/L    Comment: (NOTE) Elevated high sensitivity troponin I (hsTnI) values and significant  changes across serial measurements may suggest ACS but many other  chronic and acute conditions are known to elevate hsTnI results.  Refer to the "Links" section for chest pain algorithms and additional  guidance. Performed at [facility], [address], [place], [place] [address]   Troponin I (High Sensitivity)     Status: None   Collection Time: 06/27/23  6:45 PM  Result Value Ref Range   Troponin I (High Sensitivity) 13 <18 ng/L    Comment: (NOTE) Elevated high sensitivity troponin I (hsTnI) values and significant  changes across serial measurements may suggest ACS but many other  chronic and acute conditions are known to elevate hsTnI results.  Refer to the "Links" section for chest pain algorithms and additional  guidance. Performed at [facility], [address], [place], [place] [address]   Urinalysis, Routine w reflex microscopic -Urine, Clean Catch     Status: Abnormal   Collection Time: 06/27/23  7:54 PM  Result Value Ref Range   Color, Urine AMBER (A) YELLOW    Comment: BIOCHEMICALS MAY BE AFFECTED  BY COLOR   APPearance CLOUDY (A) CLEAR   Specific Gravity, Urine 1.012 1.005 - 1.030   pH 5.0 5.0 - 8.0   Glucose, UA NEGATIVE NEGATIVE mg/dL   Hgb urine dipstick NEGATIVE NEGATIVE   Bilirubin Urine NEGATIVE NEGATIVE   Ketones, ur NEGATIVE NEGATIVE mg/dL   Protein, ur 30 (A) NEGATIVE mg/dL   Nitrite NEGATIVE NEGATIVE   Leukocytes,Ua LARGE (A) NEGATIVE   RBC / HPF 6-10 0 - 5 RBC/hpf   WBC, UA >50 0 - 5 WBC/hpf   Bacteria,  UA MANY (A) NONE SEEN   Squamous Epithelial / HPF 0-5 0 - 5 /HPF   WBC Clumps PRESENT    Mucus PRESENT    Hyaline Casts, UA PRESENT     Comment: Performed at [facility], [address], [place], [place] [address]  Rapid urine drug screen (hospital performed)     Status: Abnormal   Collection Time: 06/27/23  7:54 PM  Result Value Ref Range   Opiates NONE DETECTED NONE DETECTED   Cocaine NONE DETECTED NONE DETECTED   Benzodiazepines NONE DETECTED NONE DETECTED   Amphetamines NONE DETECTED NONE DETECTED   Tetrahydrocannabinol POSITIVE (A) NONE DETECTED   Barbiturates NONE DETECTED NONE DETECTED    Comment: (NOTE) DRUG SCREEN FOR MEDICAL PURPOSES ONLY.  IF CONFIRMATION IS NEEDED FOR ANY PURPOSE, NOTIFY LAB WITHIN 5 DAYS.  LOWEST DETECTABLE LIMITS FOR URINE DRUG SCREEN Drug Class                     Cutoff (ng/mL) Amphetamine and metabolites    1000 Barbiturate and metabolites    200 Benzodiazepine                 200 Opiates and metabolites        300 Cocaine and metabolites        300 THC                            50 Performed at [facility] Lab, [address] [place], [place] [address]   Ethanol     Status: Abnormal   Collection Time: 06/27/23  8:41 PM  Result Value Ref Range   Alcohol, Ethyl (B) 161 (H) <10 mg/dL    Comment: (NOTE) Lowest detectable limit for serum alcohol is 10 mg/dL.  For medical purposes only. Performed at [facility], [address], [place], [place] [address]   SARS Coronavirus 2 by RT PCR (hospital  order, performed in [facility] hospital lab) *cepheid single result test* Anterior Nasal Swab     Status: None   Collection Time: 06/27/23 10:35 PM   Specimen: Anterior Nasal Swab  Result Value Ref Range   SARS Coronavirus 2 by RT PCR NEGATIVE NEGATIVE    Comment: Performed at [facility], [address] [place], [place] [address]  Comprehensive metabolic panel     Status: Abnormal   Collection Time: 06/28/23  5:50 AM  Result Value Ref Range   Sodium 139 135 - 145 mmol/L   Potassium 3.4 (L) 3.5 - 5.1 mmol/L   Chloride 101 98 - 111 mmol/L   CO2 25 22 - 32 mmol/L   Glucose, Bld 112 (H) 70 - 99 mg/dL    Comment: Glucose reference range applies only to samples taken after fasting for at least 8 hours.   BUN 7 (L) 8 - 23 mg/dL   Creatinine, Ser 7.78 0.61 - 1.24 mg/dL   Calcium 8.1 (L) 8.9 - 10.3 mg/dL   Total Protein 6.0 (L) 6.5 - 8.1 g/dL   Albumin 3.2 (L) 3.5 - 5.0 g/dL   AST 88 (H) 15 - 41 U/L   ALT 61 (H) 0 - 44 U/L   Alkaline Phosphatase 68 38 - 126 U/L   Total Bilirubin 2.6 (H) <1.2 mg/dL   GFR, Estimated >24 >23 mL/min    Comment: (NOTE) Calculated using the CKD-EPI Creatinine Equation (2021)    Anion gap 13  5 - 15    Comment: Performed at [facility], [address], [place], [place] [address]  Magnesium     Status: Abnormal   Collection Time: 06/28/23  5:50 AM  Result Value Ref Range   Magnesium 1.1 (L) 1.7 - 2.4 mg/dL    Comment: Performed at [facility], [address] [place], [place] [address]  CK     Status: None   Collection Time: 06/28/23  5:50 AM  Result Value Ref Range   Total CK 53 49 - 397 U/L    Comment: Performed at [facility], [address], [place], [place] [address]   DG Chest 2 View  Result Date: 06/27/2023 CLINICAL DATA:  Chest pain. EXAM: CHEST - 2 VIEW COMPARISON:  Chest radiograph dated 05/29/2023. FINDINGS: Left lung base scarring. No focal consolidation, pleural effusion, or pneumothorax. Stable enlargement of the  cardiac silhouette. No acute osseous pathology. Degenerative changes of the spine and shoulders. IMPRESSION: 1. No active cardiopulmonary disease. 2. Cardiomegaly. Electronically Signed   By: Elgie Collard M.D.   On: 06/27/2023 19:12    Pending Labs Unresulted Labs (From admission, onward)    None       Vitals/Pain Today's Vitals   06/28/23 0700 06/28/23 1020 06/28/23 1101 06/28/23 1200  BP: 116/77 126/78  123/86  Pulse: 85 76  74  Resp: 17 18  19   Temp: 98.6 F (37 C)  98.5 F (36.9 C)   TempSrc: Oral  Oral   SpO2: 95% 98%  97%  PainSc: 0-No pain       Isolation Precautions Airborne and Contact precautions  Medications Medications  diltiazem (CARDIZEM) 125 mg in dextrose 5% 125 mL (1 mg/mL) infusion (0 mg/hr Intravenous Hold 06/27/23 2101)  apixaban (ELIQUIS) tablet 5 mg (5 mg Oral Given 06/28/23 0935)  tamsulosin (FLOMAX) capsule 0.4 mg (0.4 mg Oral Given 06/28/23 0935)  metoprolol tartrate (LOPRESSOR) tablet 25 mg (25 mg Oral Given 06/28/23 0935)  acetaminophen (TYLENOL) tablet 500 mg (has no administration in time range)    Or  acetaminophen (TYLENOL) suppository 650 mg (has no administration in time range)  ondansetron (ZOFRAN) tablet 4 mg (has no administration in time range)    Or  ondansetron (ZOFRAN) injection 4 mg (has no administration in time range)  sorbitol 70 % solution 30 mL (has no administration in time range)  melatonin tablet 3 mg (has no administration in time range)  LORazepam (ATIVAN) tablet 1-4 mg (has no administration in time range)    Or  LORazepam (ATIVAN) injection 1-4 mg (has no administration in time range)  thiamine (VITAMIN B1) tablet 100 mg (100 mg Oral Given 06/28/23 0935)    Or  thiamine (VITAMIN B1) injection 100 mg ( Intravenous See Alternative 06/28/23 0935)  folic acid (FOLVITE) tablet 1 mg (1 mg Oral Given 06/28/23 0935)  multivitamin with minerals tablet 1 tablet (1 tablet Oral Given 06/28/23 0935)  HYDROcodone-acetaminophen  (NORCO/VICODIN) 5-325 MG per tablet 1 tablet (1 tablet Oral Given 06/27/23 1735)  diltiazem (CARDIZEM) injection 10 mg (10 mg Intravenous Given 06/27/23 1735)  potassium chloride (KLOR-CON) packet 20 mEq (20 mEq Oral Given 06/27/23 2057)  magnesium sulfate IVPB 4 g 100 mL (0 g Intravenous Stopped 06/28/23 0958)  potassium chloride SA (KLOR-CON M) CR tablet 40 mEq (40 mEq Oral Given 06/28/23 1215)    Mobility walks with device     Focused Assessments Cardiac Assessment Handoff:  Cardiac Rhythm: Atrial fibrillation Lab Results  Component Value Date   CKTOTAL 53 06/28/2023   No results found for: "DDIMER" Does the Patient currently have chest pain? No   CIWA Q6H:   Patient scoring between 1-3 d/t tremors in bilateral hands.      R Recommendations: See Admitting Provider Note  Report given to:   Additional Notes: Voids using urinal without difficulty.

## 2023-06-28 NOTE — ED Notes (Signed)
The patient requested that the heat be turned down in his room.  Facilities contacted for the request.

## 2023-06-28 NOTE — Progress Notes (Signed)
PROGRESS NOTE    Taylor Myers  WUJ:[id number] DOB: January 28, 1951 DOA: 06/27/2023 PCP: Gaspar Garbe, MD  Chief Complaint  Patient presents with   Chest Pain    Brief Narrative:   72 y.o. male with medical history significant for chronic atrial fibrillation on Eliquis and heavy alcohol use who presented with chest pain and was found to have RVR.  Apparently this was in the setting of exercise.  He denied recent etoh use, but his etoh was positive.  See below for additional details    Assessment & Plan:   Principal Problem:   Atrial fibrillation with RVR (HCC)  Taylor Myers with RVR Started on Metoprolol 25 mg BID (IV dilt weaned off) Echo pending Continue eliquis   History of alcohol abuse- the patient says he has not had any alcohol in over 2 weeks, but etoh was positive.  Will put him on CIWA protocol just for monitoring.  He denies hx withdrawal.  Chest pain- He describes the pain is musculoskeletal but the pain went away when he was given Cardizem to slow his heart rate down.  Will continue to cycle cardiac enzymes and follow.  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 Follows with ophtho (see 03/09/2023 note in care everywhere  Therapy evaluation pending    DVT prophylaxis: eliquis Code Status: full Family Communication: none Disposition:   Status is: Inpatient Remains inpatient appropriate because: need for continued inpatient care, monitoring for etoh withdrawal   Consultants:  none  Procedures:  none  Antimicrobials:  Anti-infectives (From admission, onward)    None       Subjective: No new complaints  Objective: Vitals:   06/28/23 1400 06/28/23 1452 06/28/23 1500 06/28/23 1544  BP: 135/88  (!) 146/99 (!) 151/88  Pulse: 75  79 78  Resp: 16  20 (!) 21  Temp:  99.3 F (37.4 C)  97.7 F (36.5 C)  TempSrc:  Oral  Oral  SpO2: 98%  95% 90%  Height:  5\' 9"  (1.753 m)      Intake/Output Summary (Last 24 hours) at 06/28/2023 1634 Last  data filed at 06/28/2023 1500 Gross per 24 hour  Intake 0 ml  Output 100 ml  Net -100 ml   There were no vitals filed for this visit.  Examination:  General exam: Appears calm and comfortable  Respiratory system: unlabored Cardiovascular system: RRR. Gastrointestinal system: Abdomen is nondistended, soft and nontender. Central nervous system: Alert and oriented. No focal neurological deficits. Extremities: no LEE    Data Reviewed: I have personally reviewed following labs and imaging studies  CBC: Recent Labs  Lab 06/27/23 1645  WBC 3.8*  HGB 14.1  HCT 41.9  MCV 101.0*  PLT 248    Basic Metabolic Panel: Recent Labs  Lab 06/27/23 1645 06/28/23 0550  NA 144 139  K 3.6 3.4*  CL 103 101  CO2 24 25  GLUCOSE 84 112*  BUN 7* 7*  CREATININE 0.79 0.77  CALCIUM 8.4* 8.1*  MG  --  1.1*    GFR: Estimated Creatinine Clearance: 79.1 mL/min (by C-G formula based on SCr of 0.77 mg/dL).  Liver Function Tests: Recent Labs  Lab 06/28/23 0550  AST 88*  ALT 61*  ALKPHOS 68  BILITOT 2.6*  PROT 6.0*  ALBUMIN 3.2*    CBG: No results for input(s): "GLUCAP" in the last 168 hours.   Recent Results (from the past 240 hour(s))  SARS Coronavirus 2 by RT PCR (hospital order, performed in [facility] hospital  lab) *cepheid single result test* Anterior Nasal Swab     Status: None   Collection Time: 06/27/23 10:35 PM   Specimen: Anterior Nasal Swab  Result Value Ref Range Status   SARS Coronavirus 2 by RT PCR NEGATIVE NEGATIVE Final    Comment: Performed at [facility] Lab, [address], [place], [place] [address]         Radiology Studies: DG Chest 2 View  Result Date: 06/27/2023 CLINICAL DATA:  Chest pain. EXAM: CHEST - 2 VIEW COMPARISON:  Chest radiograph dated 05/29/2023. FINDINGS: Left lung base scarring. No focal consolidation, pleural effusion, or pneumothorax. Stable enlargement of the cardiac silhouette. No acute osseous pathology. Degenerative changes of  the spine and shoulders. IMPRESSION: 1. No active cardiopulmonary disease. 2. Cardiomegaly. Electronically Signed   By: Elgie Collard M.D.   On: 06/27/2023 19:12        Scheduled Meds:  apixaban  5 mg Oral BID   folic acid  1 mg Oral Daily   metoprolol tartrate  25 mg Oral BID   multivitamin with minerals  1 tablet Oral Daily   tamsulosin  0.4 mg Oral Daily   thiamine  100 mg Oral Daily   Or   thiamine  100 mg Intravenous Daily   Continuous Infusions:  diltiazem (CARDIZEM) infusion Stopped (06/27/23 2101)     LOS: 1 day    Time spent: over 30 min    Lacretia Nicks, MD [facility]   To contact the attending provider between 7A-7P or the covering provider during after hours 7P-7A, please log into the web site [email or web address] and access using universal [place] password for that web site. If you do not have the password, please call the hospital operator.  06/28/2023, 4:34 PM

## 2023-06-28 NOTE — Plan of Care (Signed)
  Problem: Nutrition: Goal: Adequate nutrition will be maintained Outcome: Progressing   Problem: Coping: Goal: Level of anxiety will decrease Outcome: Progressing   Problem: Elimination: Goal: Will not experience complications related to bowel motility Outcome: Progressing   

## 2023-06-28 NOTE — ED Notes (Signed)
NAD noted at this time, pt resting in bed, respirations even and unlabored, skin warm and dry, bed in low position, call light within reach. Comfort measures offered. Will continue to monitor. Pt is sleeping. SR on cardiac monitor.

## 2023-06-29 ENCOUNTER — Encounter (HOSPITAL_COMMUNITY): Payer: Self-pay | Admitting: Family Medicine

## 2023-06-29 ENCOUNTER — Observation Stay (HOSPITAL_BASED_OUTPATIENT_CLINIC_OR_DEPARTMENT_OTHER): Payer: Medicare Other

## 2023-06-29 DIAGNOSIS — I4891 Unspecified atrial fibrillation: Secondary | ICD-10-CM

## 2023-06-29 DIAGNOSIS — Z1152 Encounter for screening for COVID-19: Secondary | ICD-10-CM | POA: Diagnosis not present

## 2023-06-29 DIAGNOSIS — Z7901 Long term (current) use of anticoagulants: Secondary | ICD-10-CM | POA: Diagnosis not present

## 2023-06-29 DIAGNOSIS — N390 Urinary tract infection, site not specified: Secondary | ICD-10-CM | POA: Diagnosis not present

## 2023-06-29 LAB — BASIC METABOLIC PANEL
Anion gap: 10 (ref 5–15)
BUN: 10 mg/dL (ref 8–23)
CO2: 28 mmol/L (ref 22–32)
Calcium: 8.7 mg/dL — ABNORMAL LOW (ref 8.9–10.3)
Chloride: 100 mmol/L (ref 98–111)
Creatinine, Ser: 0.92 mg/dL (ref 0.61–1.24)
GFR, Estimated: >60 mL/min (ref >60)
Glucose, Bld: 109 mg/dL — ABNORMAL HIGH (ref 70–99)
Potassium: 3.5 mmol/L (ref 3.5–5.1)
Sodium: 138 mmol/L (ref 135–145)

## 2023-06-29 LAB — ECHOCARDIOGRAM COMPLETE
AR max vel: 2.33 cm2
AV Area VTI: 2.3 cm2
AV Area mean vel: 2.28 cm2
AV Mean grad: 3 mm[Hg]
AV Peak grad: 6 mm[Hg]
Ao pk vel: 1.23 m/s
Area-P 1/2: 4.26 cm2
Height: 69 in
S' Lateral: 2.3 cm

## 2023-06-29 LAB — TSH: TSH: 1.665 u[IU]/mL (ref 0.350–4.500)

## 2023-06-29 LAB — MAGNESIUM: Magnesium: 1.6 mg/dL — ABNORMAL LOW (ref 1.7–2.4)

## 2023-06-29 MED ORDER — SODIUM CHLORIDE 0.9 % IV SOLN
1.0000 g | INTRAVENOUS | Status: DC
  Administered 2023-06-29 – 2023-06-30 (×2): 1 g via INTRAVENOUS
  Filled 2023-06-29 (×2): qty 10

## 2023-06-29 MED ORDER — MAGNESIUM SULFATE 2 GM/50ML IV SOLN
2.0000 g | Freq: Once | INTRAVENOUS | Status: AC
  Administered 2023-06-29: 2 g via INTRAVENOUS
  Filled 2023-06-29: qty 50

## 2023-06-29 MED ORDER — POTASSIUM CHLORIDE 20 MEQ PO PACK
40.0000 meq | PACK | Freq: Once | ORAL | Status: AC
  Administered 2023-06-29: 40 meq via ORAL
  Filled 2023-06-29: qty 2

## 2023-06-29 NOTE — Evaluation (Addendum)
Physical Therapy Evaluation Patient Details Name: Taylor Myers MRN: [id number] DOB: 26-Jan-1951 Today's Date: 06/29/2023  History of Present Illness  Pt is a 72 y/o male admitted 06/27/23 with chest pain and a-fib w/RVR. 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  Clinical Impression  Patient presents with decreased mobility due to generalized weakness, bedrest and prior limited mobility using wheelchair mainly.  States able to complete ADL/most IADL's at wheelchair level.  Hopeful to get to pool therapy at [facility], but still dealing with issues with R eye corneal abrasion.  Today CGA for transfer to recliner at bedside and S for sit to stand.  PT will follow up in the acute setting and recommend HHPT at d/c.         If plan is discharge home, recommend the following: Assist for transportation;Help with stairs or ramp for entrance   Can travel by private vehicle        Equipment Recommendations None recommended by PT  Recommendations for Other Services       Functional Status Assessment Patient has had a recent decline in their functional status and demonstrates the ability to make significant improvements in function in a reasonable and predictable amount of time.     Precautions / Restrictions Precautions Precautions: Fall Restrictions Weight Bearing Restrictions: No      Mobility  Bed Mobility Overal bed mobility: Modified Independent                  Transfers Overall transfer level: Needs assistance   Transfers: Sit to/from Stand, Bed to chair/wheelchair/BSC Sit to Stand: Supervision     Squat pivot transfers: Contact guard assist     General transfer comment: up to recliner at bedside with increased time and UE use CGA for safety, but pt moving under his own power, sit to stand from recliner with S pt pushing up from armrests.    Ambulation/Gait                   Stairs            Wheelchair Mobility     Tilt Bed    Modified Rankin (Stroke Patients Only)       Balance Overall balance assessment: Needs assistance   Sitting balance-Leahy Scale: Good       Standing balance-Leahy Scale: Poor Standing balance comment: standing from recliner with one hand on armrest and legs braced against chair                             Pertinent Vitals/Pain Pain Assessment Pain Assessment: Faces Faces Pain Scale: Hurts a little bit Pain Location: generalized Pain Descriptors / Indicators: Aching, Tightness Pain Intervention(s): Monitored during session, Repositioned    Home Living Family/patient expects to be discharged to:: Private residence Living Arrangements: Parent Available Help at Discharge: Family Type of Home: House Home Access: Elevator       Home Layout: One level Home Equipment: Wheelchair - Forensic psychologist (2 wheels);Grab bars - tub/shower;Grab bars - toilet;Hand held shower head;Shower seat - built in Additional Comments: 15+ y/o father lives with pt; some sundowning and cognitive decline per pt    Prior Function Prior Level of Function : Independent/Modified Independent             Mobility Comments: uses wheelchair mainly due to LE weakness, 2  falls in past year ADLs Comments: gets meals on wheels and instacart     Extremity/Trunk Assessment   Upper Extremity Assessment Upper Extremity Assessment: RUE deficits/detail RUE Deficits / Details: shoulder and elbow ROM limitations from prior elbow fracture; shoulder flexion strength 2+/5, elbow flexion 3+/5 RUE Sensation: decreased light touch    Lower Extremity Assessment Lower Extremity Assessment: RLE deficits/detail;LLE deficits/detail RLE Deficits / Details: AROM WFL, strength 4/5, note muscle wasting and healed incisions from prior TKA's LLE Deficits / Details: AROM WFL, strength 4/5, note muscle wasting and healed  incisions from prior TKA's    Cervical / Trunk Assessment Cervical / Trunk Assessment: Kyphotic  Communication   Communication Communication: No apparent difficulties  Cognition Arousal: Alert Behavior During Therapy: WFL for tasks assessed/performed Overall Cognitive Status: Within Functional Limits for tasks assessed                                          General Comments      Exercises     Assessment/Plan    PT Assessment Patient needs continued PT services  PT Problem List Decreased strength;Decreased balance;Decreased mobility       PT Treatment Interventions Functional mobility training;Balance training;Patient/family education;Therapeutic activities;Therapeutic exercise    PT Goals (Current goals can be found in the Care Plan section)  Acute Rehab PT Goals Patient Stated Goal: return home PT Goal Formulation: With patient Time For Goal Achievement: 07/13/23 Potential to Achieve Goals: Good    Frequency Min 1X/week     Co-evaluation               AM-PAC PT "6 Clicks" Mobility  Outcome Measure Help needed turning from your back to your side while in a flat bed without using bedrails?: None Help needed moving from lying on your back to sitting on the side of a flat bed without using bedrails?: None Help needed moving to and from a bed to a chair (including a wheelchair)?: A Little Help needed standing up from a chair using your arms (e.g., wheelchair or bedside chair)?: None Help needed to walk in hospital room?: Total Help needed climbing 3-5 steps with a railing? : Total 6 Click Score: 17    End of Session   Activity Tolerance: Patient tolerated treatment well Patient left: in chair;with call bell/phone within reach   PT Visit Diagnosis: Muscle weakness (generalized) (M62.81)    Time: 2956-2130 PT Time Calculation (min) (ACUTE ONLY): 26 min   Charges:   PT Evaluation $PT Eval Moderate Complexity: 1 Mod PT  Treatments $Therapeutic Activity: 8-22 mins PT General Charges $$ ACUTE PT VISIT: 1 Visit         Sheran Lawless, PT Acute Rehabilitation Services Office:[phone] 06/29/2023   [name] 06/29/2023, 10:17 AM

## 2023-06-29 NOTE — Plan of Care (Signed)

## 2023-06-29 NOTE — Progress Notes (Signed)
*  PRELIMINARY RESULTS* Echocardiogram 2D Echocardiogram has been performed.  Taylor Myers 06/29/2023, 8:44 AM

## 2023-06-29 NOTE — Progress Notes (Signed)
Received call from tele about patients heart rate reaching 38bpm. MD notified, no new orders.

## 2023-06-29 NOTE — Care Management CC44 (Signed)
Condition Code 44 Documentation Completed  Patient Details  Name: Daryel Kenneth MRN: [id number] Date of Birth: [birth year]   Condition Code 44 given:  Yes Patient signature on Condition Code 44 notice:  Yes Documentation of 2 MD's agreement:  Yes Code 44 added to claim:  Yes    Harriet Masson, RN 06/29/2023, 9:18 AM

## 2023-06-29 NOTE — Progress Notes (Addendum)
PROGRESS NOTE    Taylor Myers  UYQ:[id number] DOB: 16-Jun-1951 DOA: 06/27/2023 PCP: Gaspar Garbe, MD  Brief Narrative:  This 72 yrs old male with PMH significant for chronic atrial fibrillation on Eliquis and heavy alcohol use who presented in the ED with chest pain and he was found to have A. Fib  with RVR.  Apparently this was in the setting of exercise.  He denied any recent etoh use, but his ETOH level was positive.   Assessment & Plan:   Principal Problem:   Atrial fibrillation with RVR (HCC)  Atrial fibrillation with RVR: Patient presented with rapid ventricular rate in the setting of exercise. Now heart rate is well-controlled.  Continue metoprolol 25 mg twice daily Continue Eliquis 5 mg twice daily. Echo shows LVEF 55 to 60%, no RWMA.  History of alcohol abuse: Patient states he has not had any alcohol in over 2 weeks, but ETOH level was positive.   Continue CIWA protocol just for monitoring.  He denies hx withdrawal.   Chest pain: He describes the pain is musculoskeletal but the pain went away when he was given Cardizem to slow his heart rate down.  Serial troponins were not consistent with ACS.  Continue muscle relaxer and anti-inflammatory.   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  Elevated LFTs: Likely due to the alcoholic use. Continue to trend LFTs.  UTI: Start ceftriaxone for 3 days.  DVT prophylaxis: Eliquis Code Status:Full code Family Communication:No family at bed side. Disposition Plan:     Status is: Observation The patient remains OBS appropriate and will d/c before 2 midnights.   Patient admitted for A-fib with RVR in the setting of exercise.  PT and OT recommended home health services. Anticipated discharge home 06/30/2023.  Consultants:  None  Procedures:  Echocardiogram.  Antimicrobials: None   Subjective: Patient was seen and examined at bedside.  Overnight events noted.   Patient reports doing better.  He stated he is wheelchair-bound.  He reports feeling very weak and fatigued.  Objective: Vitals:   06/29/23 0300 06/29/23 0600 06/29/23 0804 06/29/23 0936  BP: 103/71  (!) 90/51 111/63  Pulse: 71 70 88 86  Resp: 19  14   Temp: 98.6 F (37 C)  98.4 F (36.9 C)   TempSrc: Oral  Oral   SpO2: 94%  96%   Weight:    83 kg  Height:    5\' 9"  (1.753 m)    Intake/Output Summary (Last 24 hours) at 06/29/2023 1037 Last data filed at 06/28/2023 1500 Gross per 24 hour  Intake 0 ml  Output --  Net 0 ml   Filed Weights   06/29/23 0936  Weight: 83 kg    Examination:  General exam: Appears calm and comfortable, deconditioned, not in any acute distress Respiratory system: Clear to auscultation. Respiratory effort normal. RR 13 Cardiovascular system: S1 & S2 heard, RRR. No JVD, murmurs, rubs, gallops or clicks. No pedal edema. Gastrointestinal system: Abdomen is non distended, soft and non tender. Normal bowel sounds heard. Central nervous system: Alert and oriented X 3. No focal neurological deficits. Extremities: No edema, no cyanosis, no clubbing. Skin: No rashes, lesions or ulcers Psychiatry: Judgement and insight appear normal. Mood & affect appropriate.     Data Reviewed: I have personally reviewed following labs and imaging studies  CBC: Recent Labs  Lab 06/27/23 1645  WBC 3.8*  HGB 14.1  HCT 41.9  MCV 101.0*  PLT 248   Basic Metabolic Panel: Recent Labs  Lab 06/27/23 1645 06/28/23 0550  NA 144 139  K 3.6 3.4*  CL 103 101  CO2 24 25  GLUCOSE 84 112*  BUN 7* 7*  CREATININE 0.79 0.77  CALCIUM 8.4* 8.1*  MG  --  1.1*   GFR: Estimated Creatinine Clearance: 82.2 mL/min (by C-G formula based on SCr of 0.77 mg/dL). Liver Function Tests: Recent Labs  Lab 06/28/23 0550  AST 88*  ALT 61*  ALKPHOS 68   BILITOT 2.6*  PROT 6.0*  ALBUMIN 3.2*   No results for input(s): "LIPASE", "AMYLASE" in the last 168 hours. No results for input(s): "AMMONIA" in the last 168 hours. Coagulation Profile: No results for input(s): "INR", "PROTIME" in the last 168 hours. Cardiac Enzymes: Recent Labs  Lab 06/28/23 0550  CKTOTAL 53   BNP (last 3 results) No results for input(s): "PROBNP" in the last 8760 hours. HbA1C: No results for input(s): "HGBA1C" in the last 72 hours. CBG: No results for input(s): "GLUCAP" in the last 168 hours. Lipid Profile: No results for input(s): "CHOL", "HDL", "LDLCALC", "TRIG", "CHOLHDL", "LDLDIRECT" in the last 72 hours. Thyroid Function Tests: Recent Labs    06/29/23 0227  TSH 1.665   Anemia Panel: No results for input(s): "VITAMINB12", "FOLATE", "FERRITIN", "TIBC", "IRON", "RETICCTPCT" in the last 72 hours. Sepsis Labs: No results for input(s): "PROCALCITON", "LATICACIDVEN" in the last 168 hours.  Recent Results (from the past 240 hour(s))  SARS Coronavirus 2 by RT PCR (hospital order, performed in [facility] hospital lab) *cepheid single result test* Anterior Nasal Swab     Status: None   Collection Time: 06/27/23 10:35 PM   Specimen: Anterior Nasal Swab  Result Value Ref Range Status   SARS Coronavirus 2 by RT PCR NEGATIVE NEGATIVE Final    Comment: Performed at [facility], [address] [place], [place] [address]  MRSA Next Gen by PCR, Nasal     Status: None   Collection Time: 06/28/23  3:02 PM   Specimen: Nasal Mucosa; Nasal Swab  Result Value Ref Range Status   MRSA by PCR Next Gen NOT DETECTED NOT DETECTED Final    Comment: (NOTE) The GeneXpert MRSA Assay (FDA approved for NASAL specimens only), is one component of a comprehensive MRSA colonization surveillance program. It is not intended to diagnose MRSA infection nor to guide or monitor treatment for MRSA infections. Test performance is not FDA approved in patients less than 85  years old. Performed at [facility], [address], [place], [place] [address]     Radiology Studies: ECHOCARDIOGRAM COMPLETE  Result Date: 06/29/2023    ECHOCARDIOGRAM REPORT   Patient Name:   AMES HOBAN Date of Exam: 06/29/2023 Medical Rec #:  [id number]   Height:       69.0 in Accession #:    [id number]  Weight:       150.0 lb Date of Birth:  09/28/1950  BSA:          1.828 m Patient Age:    71 years    BP:           103/71 mmHg Patient Gender: M           HR:           74 bpm. Exam Location:  Inpatient Procedure: 2D Echo, Cardiac Doppler and Color Doppler Indications:    A-fib  I48.91  History:        Patient has prior history of Echocardiogram examinations, most                 recent 07/13/2022. ETOH abuse, Arrythmias:Atrial Fibrillation;                 Risk Factors:Former Smoker and Hypertension.  Sonographer:    Dondra Prader RVT RCS Referring Phys: [phone] A CALDWELL POWELL JR IMPRESSIONS  1. Left ventricular ejection fraction, by estimation, is 55 to 60%. The left ventricle has normal function. The left ventricle has no regional wall motion abnormalities. There is mild left ventricular hypertrophy. Left ventricular diastolic parameters are indeterminate.  2. Right ventricule not well visualized, but systolic function appears mild to moderately reduced. The right ventricular size is normal. There is normal pulmonary artery systolic pressure. The estimated right ventricular systolic pressure is 24.0 mmHg.  3. Left atrial size was severely dilated.  4. Right atrial size was mildly dilated.  5. The mitral valve is normal in structure. Trivial mitral valve regurgitation.  6. The aortic valve was not well visualized. Aortic valve regurgitation is trivial. No aortic stenosis is present.  7. Aortic dilatation noted. There is dilatation of the aortic root, measuring 40 mm.  8. The inferior vena cava is normal in size with greater than 50% respiratory variability, suggesting right atrial pressure of 3  mmHg. Comparison(s): 11/21-EF 55-60%. FINDINGS  Left Ventricle: Left ventricular ejection fraction, by estimation, is 55 to 60%. The left ventricle has normal function. The left ventricle has no regional wall motion abnormalities. The left ventricular internal cavity size was normal in size. There is  mild left ventricular hypertrophy. Left ventricular diastolic parameters are indeterminate. Right Ventricle: The right ventricular size is normal. Right vetricular wall thickness was not well visualized. Right ventricular systolic function is mildly reduced. There is normal pulmonary artery systolic pressure. The tricuspid regurgitant velocity is 2.29 m/s, and with an assumed right atrial pressure of 3 mmHg, the estimated right ventricular systolic pressure is 24.0 mmHg. Left Atrium: Left atrial size was severely dilated. Right Atrium: Right atrial size was mildly dilated. Pericardium: Trivial pericardial effusion is present. Mitral Valve: The mitral valve is normal in structure. Trivial mitral valve regurgitation. Tricuspid Valve: The tricuspid valve is normal in structure. Tricuspid valve regurgitation is trivial. Aortic Valve: The aortic valve was not well visualized. Aortic valve regurgitation is trivial. No aortic stenosis is present. Aortic valve mean gradient measures 3.0 mmHg. Aortic valve peak gradient measures 6.0 mmHg. Aortic valve area, by VTI measures 2.30 cm. Pulmonic Valve: The pulmonic valve was not well visualized. Pulmonic valve regurgitation is not visualized. Aorta: The aortic root and ascending aorta are structurally normal, with no evidence of dilitation and aortic dilatation noted. There is dilatation of the aortic root, measuring 40 mm. Venous: The inferior vena cava is normal in size with greater than 50% respiratory variability, suggesting right atrial pressure of 3 mmHg. IAS/Shunts: The interatrial septum was not well visualized.  LEFT VENTRICLE PLAX 2D LVIDd:         4.60 cm   Diastology  LVIDs:         2.30 cm   LV e' medial:    6.49 cm/s LV PW:         1.00 cm   LV E/e' medial:  10.4 LV IVS:        1.30 cm   LV e' lateral:   7.54 cm/s LVOT diam:  1.90 cm   LV E/e' lateral: 9.0 LV SV:         47 LV SV Index:   26 LVOT Area:     2.84 cm  RIGHT VENTRICLE            IVC RV S prime:     6.08 cm/s  IVC diam: 1.90 cm LEFT ATRIUM              Index        RIGHT ATRIUM           Index LA diam:        4.60 cm  2.52 cm/m   RA Area:     20.00 cm LA Vol (A2C):   102.0 ml 55.79 ml/m  RA Volume:   56.40 ml  30.85 ml/m LA Vol (A4C):   103.0 ml 56.34 ml/m LA Biplane Vol: 104.0 ml 56.89 ml/m  AORTIC VALVE                    PULMONIC VALVE AV Area (Vmax):    2.33 cm     PV Vmax:       0.90 m/s AV Area (Vmean):   2.28 cm     PV Peak grad:  3.2 mmHg AV Area (VTI):     2.30 cm AV Vmax:           122.60 cm/s AV Vmean:          79.000 cm/s AV VTI:            0.206 m AV Peak Grad:      6.0 mmHg AV Mean Grad:      3.0 mmHg LVOT Vmax:         100.95 cm/s LVOT Vmean:        63.525 cm/s LVOT VTI:          0.167 m LVOT/AV VTI ratio: 0.81  AORTA Ao Root diam: 4.00 cm Ao Asc diam:  3.70 cm MITRAL VALVE               TRICUSPID VALVE MV Area (PHT): 4.26 cm    TR Peak grad:   21.0 mmHg MV Decel Time: 178 msec    TR Vmax:        229.00 cm/s MV E velocity: 67.60 cm/s MV A velocity: 31.00 cm/s  SHUNTS MV E/A ratio:  2.18        Systemic VTI:  0.17 m                            Systemic Diam: 1.90 cm Epifanio Lesches MD Electronically signed by Epifanio Lesches MD Signature Date/Time: 06/29/2023/9:55:36 AM    Final    DG Chest 2 View  Result Date: 06/27/2023 CLINICAL DATA:  Chest pain. EXAM: CHEST - 2 VIEW COMPARISON:  Chest radiograph dated 05/29/2023. FINDINGS: Left lung base scarring. No focal consolidation, pleural effusion, or pneumothorax. Stable enlargement of the cardiac silhouette. No acute osseous pathology. Degenerative changes of the spine and shoulders. IMPRESSION: 1. No active cardiopulmonary  disease. 2. Cardiomegaly. Electronically Signed   By: Elgie Collard M.D.   On: 06/27/2023 19:12    Scheduled Meds:  apixaban  5 mg Oral BID   folic acid  1 mg Oral Daily   metoprolol tartrate  25 mg Oral BID   multivitamin with minerals  1 tablet Oral Daily   tamsulosin  0.4 mg Oral Daily  thiamine  100 mg Oral Daily   Or   thiamine  100 mg Intravenous Daily   Continuous Infusions:  magnesium sulfate bolus IVPB 2 g (06/29/23 0940)     LOS: 1 day    Time spent: 50 mins.    Willeen Niece, MD [facility]   If 7PM-7AM, please contact night-coverage

## 2023-06-29 NOTE — Progress Notes (Signed)
   06/29/23 1405  Spiritual Encounters  Type of Visit Attempt (pt unavailable)  Referral source Nurse (RN/NT/LPN)  Reason for visit Routine spiritual support  OnCall Visit No   Visited patient per consult, however patient is sleeping.

## 2023-06-29 NOTE — Progress Notes (Signed)
Chaplain attempted to respond to Taylor Myers consult for prayer. Pt was asleep at the time of visit and did not respond to knocking or the calling of his name. Our team will continue to follow

## 2023-06-29 NOTE — Care Management Obs Status (Signed)
MEDICARE OBSERVATION STATUS NOTIFICATION   Patient Details  Name: Taylor Myers MRN: [id number] Date of Birth: 02/26/1951   Medicare Observation Status Notification Given:  Yes    Harriet Masson, RN 06/29/2023, 9:16 AM

## 2023-06-29 NOTE — TOC Initial Note (Signed)
Transition of Care [facility]) - Initial/Assessment Note    Patient Details  Name: Taylor Myers MRN: [id number] Date of Birth: 1950-09-07  Transition of Care [facility]) CM/SW Contact:    Harriet Masson, RN Phone Number: 06/29/2023, 10:23 AM  Clinical Narrative:                  Spoke to patient at bedside regarding transition needs.  Patient lives with his father.  Patient has walker, and wheelchair. Patient is agreeable to home health and has used [facility] in the past and would like to use them again. Kandee Keen with [facility] accepted referral.  Patient is agreeable to substance resources. Packet left at bedside.  Patient will need PTAR transportation home.  Expected Discharge Plan: Home w Home Health Services Barriers to Discharge: Continued Medical Work up   Patient Goals and CMS Choice Patient states their goals for this hospitalization and ongoing recovery are:: return home CMS Medicare.gov Compare Post Acute Care list provided to:: Patient Choice offered to / list presented to : Patient      Expected Discharge Plan and Services   Discharge Planning Services: CM Consult Post Acute Care Choice: Home Health Living arrangements for the past 2 months: Single Family Home                           HH Arranged: PT HH Agency: [facility] Date [facility] Agency Contacted: 06/29/23 Time HH Agency Contacted: 1023 Representative spoke with at [facility] Agency: Kandee Keen  Prior Living Arrangements/Services Living arrangements for the past 2 months: Single Family Home Lives with:: Parents Patient language and need for interpreter reviewed:: Yes Do you feel safe going back to the place where you live?: Yes      Need for Family Participation in Patient Care: Yes (Comment) Care giver support system in place?: Yes (comment) Current home services: DME (wheelchair, walker) Criminal Activity/Legal Involvement Pertinent to Current Situation/Hospitalization: No - Comment as needed  Activities of Daily  Living   ADL Screening (condition at time of admission) Independently performs ADLs?: No Does the patient have a NEW difficulty with bathing/dressing/toileting/self-feeding that is expected to last >3 days?: No Does the patient have a NEW difficulty with getting in/out of bed, walking, or climbing stairs that is expected to last >3 days?: No Does the patient have a NEW difficulty with communication that is expected to last >3 days?: No Is the patient deaf or have difficulty hearing?: No Does the patient have difficulty seeing, even when wearing glasses/contacts?: No Does the patient have difficulty concentrating, remembering, or making decisions?: No  Permission Sought/Granted Permission sought to share information with : Photographer granted to share info w AGENCY: Home health        Emotional Assessment Appearance:: Appears stated age Attitude/Demeanor/Rapport: Gracious Affect (typically observed): Accepting Orientation: : Oriented to Place, Oriented to Self, Oriented to  Time, Oriented to Situation Alcohol / Substance Use: Not Applicable Psych Involvement: No (comment)  Admission diagnosis:  Atrial fibrillation with RVR (HCC) [I48.91] Chest pain, unspecified type [R07.9] Patient Active Problem List   Diagnosis Date Noted   Atrial fibrillation with RVR (HCC) 06/27/2023   Alcohol use 07/13/2022   Bacterial conjunctivitis of right eye 07/13/2022   Candidal dermatitis 07/13/2022   Falls, initial encounter 07/13/2022   Bradycardia 07/12/2022   Hypokalemia 07/12/2022   Hypomagnesemia 07/12/2022   Hypocalcemia 07/12/2022   S/P ORIF (open reduction  internal fixation) fracture 06/08/2022   History of open reduction and internal fixation (ORIF) procedure 03/19/2022   Right supracondylar humerus fracture, closed, initial encounter 03/12/2022   Malnutrition of moderate degree 05/21/2021   Malnutrition (HCC) 05/19/2021   Gait difficulty  05/19/2021   Muscle weakness 05/19/2021   Noncompliance with medication regimen 05/19/2021   Stenosis of left carotid artery 05/19/2021   AKI (acute kidney injury) (HCC) 05/19/2021   Elevated LFTs 05/19/2021   Wheelchair dependence 05/19/2021   Closed fracture of left orbital floor (HCC) 05/19/2021   S/P craniotomy 11/28/2020   Hypoalbuminemia 09/19/2020   Alcohol abuse, in remission 02/06/2020   Benign prostatic hyperplasia with lower urinary tract symptoms 02/06/2020   Chronic pain 02/06/2020   Essential hypertension 02/06/2020   Long term (current) use of anticoagulants 02/06/2020   Gout 02/06/2020   Asthma without status asthmaticus 02/06/2020   Atrial fibrillation (HCC) 02/06/2020   Type 2 diabetes mellitus without complications (HCC) 02/06/2020   Trigeminal neuralgia of right side of face 02/06/2020   PCP:  Gaspar Garbe, MD Pharmacy:   [id number] - [place], [place] - [address] AT [facility] [address] [facility] [address] [place] [address] Phone: [phone] Fax: [phone]     Social Determinants of Health (SDOH) Social History: SDOH Screenings   Food Insecurity: No Food Insecurity (06/28/2023)  Housing: Low Risk  (06/28/2023)  Transportation Needs: No Transportation Needs (06/28/2023)  Utilities: Not At Risk (06/28/2023)  Tobacco Use: Medium Risk (06/29/2023)   SDOH Interventions:     Readmission Risk Interventions     No data to display

## 2023-06-30 DIAGNOSIS — I4891 Unspecified atrial fibrillation: Secondary | ICD-10-CM | POA: Diagnosis not present

## 2023-06-30 LAB — COMPREHENSIVE METABOLIC PANEL
ALT: 35 U/L (ref 0–44)
AST: 36 U/L (ref 15–41)
Albumin: 2.7 g/dL — ABNORMAL LOW (ref 3.5–5.0)
Alkaline Phosphatase: 59 U/L (ref 38–126)
Anion gap: 7 (ref 5–15)
BUN: 13 mg/dL (ref 8–23)
CO2: 28 mmol/L (ref 22–32)
Calcium: 8.5 mg/dL — ABNORMAL LOW (ref 8.9–10.3)
Chloride: 103 mmol/L (ref 98–111)
Creatinine, Ser: 0.87 mg/dL (ref 0.61–1.24)
GFR, Estimated: >60 mL/min (ref >60)
Glucose, Bld: 97 mg/dL (ref 70–99)
Potassium: 4.1 mmol/L (ref 3.5–5.1)
Sodium: 138 mmol/L (ref 135–145)
Total Bilirubin: 0.9 mg/dL (ref <1.2)
Total Protein: 5.3 g/dL — ABNORMAL LOW (ref 6.5–8.1)

## 2023-06-30 LAB — CBC
HCT: 33.4 % — ABNORMAL LOW (ref 39.0–52.0)
Hemoglobin: 11.4 g/dL — ABNORMAL LOW (ref 13.0–17.0)
MCH: 35.2 pg — ABNORMAL HIGH (ref 26.0–34.0)
MCHC: 34.1 g/dL (ref 30.0–36.0)
MCV: 103.1 fL — ABNORMAL HIGH (ref 80.0–100.0)
Platelets: 190 10*3/uL (ref 150–400)
RBC: 3.24 MIL/uL — ABNORMAL LOW (ref 4.22–5.81)
RDW: 14 % (ref 11.5–15.5)
WBC: 6.1 10*3/uL (ref 4.0–10.5)
nRBC: 0 % (ref 0.0–0.2)

## 2023-06-30 LAB — PHOSPHORUS: Phosphorus: 2.6 mg/dL (ref 2.5–4.6)

## 2023-06-30 LAB — MAGNESIUM: Magnesium: 2.5 mg/dL — ABNORMAL HIGH (ref 1.7–2.4)

## 2023-06-30 MED ORDER — VITAMIN B-1 100 MG PO TABS: 100.0000 mg | ORAL_TABLET | Freq: Every day | ORAL | 0 refills | Status: AC

## 2023-06-30 MED ORDER — MAGNESIUM SULFATE 2 GM/50ML IV SOLN
2.0000 g | Freq: Once | INTRAVENOUS | Status: AC
  Administered 2023-06-30: 2 g via INTRAVENOUS
  Filled 2023-06-30: qty 50

## 2023-06-30 MED ORDER — METOPROLOL TARTRATE 25 MG PO TABS: 12.5000 mg | ORAL_TABLET | Freq: Two times a day (BID) | ORAL | 1 refills | Status: DC

## 2023-06-30 MED ORDER — FOLIC ACID 1 MG PO TABS: 1.0000 mg | ORAL_TABLET | Freq: Every day | ORAL | 0 refills | Status: AC

## 2023-06-30 NOTE — Discharge Summary (Signed)
Physician Discharge Summary  [name] FIE:[id number] DOB: 1951/02/08 DOA: 06/27/2023  PCP: Gaspar Garbe, MD  Admit date: 06/27/2023  Discharge date: 06/30/2023  Admitted From: Home.  Disposition:  Home Health services.  Recommendations for Outpatient Follow-up:  Follow up with PCP in 1-2 weeks Please obtain BMP/CBC in one week Advised to follow-up with cardiologist Dr. Jerene Pitch in 1 week. Advised to take metoprolol 12.5 mg every 12 hours for A-fib. Continue Eliquis for A-fib  Home Health:Home PT/OT Equipment/Devices:None  Discharge Condition: Stable CODE STATUS:Full code Diet recommendation: Heart Healthy   Brief [facility] Course: This 72 yrs old male with PMH significant for chronic atrial fibrillation on Eliquis and heavy alcohol use who presented in the ED with chest pain and he was found to have A. Fib  with RVR.  Apparently this was in the setting of exercise.  He denied any recent etoh use, but his ETOH level was positive.  Patient was admitted for further evaluation.  Patient was continued on metoprolol 25 mg twice daily in addition to Eliquis 5 mg twice daily.  Echo completed showed LVEF 55 to 60%,  No RWMA.  Patient was also continued on CIWA protocol given history of alcohol abuse.  Chest pain has resolved,  most likely it was musculoskeletal which has resolved with muscle relaxants and anti-inflammatory.  PT and OT recommended home health services.  Patient feels better and  wants to be discharged.  Patient being discharged home.   Discharge Diagnoses:  Principal Problem:   Atrial fibrillation with RVR (HCC)  Atrial fibrillation with RVR: Patient presented with rapid ventricular rate in the setting of exercise. Now heart rate is well-controlled.  Continue metoprolol 25 mg twice daily Continue Eliquis 5 mg twice daily. Echo shows LVEF 55 to 60%, no RWMA.   History of alcohol abuse: Patient states he has not had any alcohol in over 2 weeks, but ETOH  level was positive.   Continue CIWA protocol just for monitoring.  He denies hx withdrawal.   Chest pain: > Resolved. He describes the pain is musculoskeletal but the pain went away when he was given Cardizem to slow his heart rate down.   Serial troponins were not consistent with ACS.  Continue muscle relaxer and anti-inflammatory agents.   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   Elevated LFTs: Likely due to the alcoholic use. Continue to trend LFTs.   UTI: Patient received ceftriaxone x 1.   Denies any further UTI symptoms.  Discharge Instructions  Discharge Instructions     Call MD for:  difficulty breathing, headache or visual disturbances   Complete by: As directed    Call MD for:  persistant dizziness or light-headedness   Complete by: As directed    Call MD for:  persistant nausea and vomiting   Complete by: As directed    Diet - low sodium heart healthy   Complete by: As directed    Diet Carb Modified   Complete by: As directed    Discharge instructions   Complete by: As directed    Advised to follow-up with primary care physician in 1 week. Advised to follow-up with cardiologist Dr. Gaynelle Arabian in 1 week. Advised to take metoprolol 12.5 mg every 12 hours for A-fib. Continue Eliquis for A-fib   Increase activity slowly   Complete by: As directed    No wound  care   Complete by: As directed       Allergies as of 06/30/2023   No Known Allergies      Medication List     STOP taking these medications    TURMERIC PO       TAKE these medications    apixaban 5 MG Tabs tablet Commonly known as: ELIQUIS Take 1 tablet (5 mg total) by mouth 2 (two) times daily.   folic acid 1 MG tablet Commonly known as: FOLVITE Take 1 tablet (1 mg total) by mouth daily. Start taking on: July 01, 2023   ibuprofen 600  MG tablet Commonly known as: ADVIL Take 600 mg by mouth every 6 (six) hours as needed for moderate pain (pain score 4-6).   LIVER DETOX PO Take 2 tablets by mouth daily.   metoprolol tartrate 25 MG tablet Commonly known as: LOPRESSOR Take 0.5 tablets (12.5 mg total) by mouth 2 (two) times daily.   NON FORMULARY Take 2 capsules by mouth See admin instructions. Beet extract capsules- Take 2 capsules by mouth once a day   tamsulosin 0.4 MG Caps capsule Commonly known as: FLOMAX Take 0.4 mg by mouth daily.   TheraTears Nutrition Caps Take 1 capsule by mouth daily.   thiamine 100 MG tablet Commonly known as: Vitamin B-1 Take 1 tablet (100 mg total) by mouth daily. Start taking on: July 01, 2023        Follow-up Information     [facility], [facility] Follow up.   Specialty: Home Health Services Why: Home health has been arranged. They will contact you to schedule apt witihin 48 hours post discharge to schedule apt. Contact information: [address] [place] [address] [phone]         Tisovec, Adelfa Koh, MD Follow up in 1 week(s).   Specialty: Internal Medicine Contact information: [address] [place] [address] [phone]         Little Ishikawa, MD Follow up in 1 week(s).   Specialties: Cardiology, Radiology Contact information: [address] [place] [address] [phone]                No Known Allergies  Consultations: None   Procedures/Studies: ECHOCARDIOGRAM COMPLETE  Result Date: 06/29/2023    ECHOCARDIOGRAM REPORT   Patient Name:   Taylor Myers Date of Exam: 06/29/2023 Medical Rec #:  [id number]   Height:       69.0 in Accession #:    [id number]  Weight:       150.0 lb Date of Birth:  [birth year]  BSA:          1.828 m Patient Age:    72 years    BP:           103/71 mmHg Patient Gender: M           HR:           74 bpm. Exam Location:  Inpatient Procedure: 2D Echo, Cardiac Doppler  and Color Doppler Indications:    A-fib I48.91  History:        Patient has prior history of Echocardiogram examinations, most                 recent 07/13/2022. ETOH abuse, Arrythmias:Atrial Fibrillation;                 Risk Factors:Former Smoker and Hypertension.  Sonographer:    Dondra Prader RVT RCS Referring Phys: [phone] A CALDWELL  POWELL JR IMPRESSIONS  1. Left ventricular ejection fraction, by estimation, is 55 to 60%. The left ventricle has normal function. The left ventricle has no regional wall motion abnormalities. There is mild left ventricular hypertrophy. Left ventricular diastolic parameters are indeterminate.  2. Right ventricule not well visualized, but systolic function appears mild to moderately reduced. The right ventricular size is normal. There is normal pulmonary artery systolic pressure. The estimated right ventricular systolic pressure is 24.0 mmHg.  3. Left atrial size was severely dilated.  4. Right atrial size was mildly dilated.  5. The mitral valve is normal in structure. Trivial mitral valve regurgitation.  6. The aortic valve was not well visualized. Aortic valve regurgitation is trivial. No aortic stenosis is present.  7. Aortic dilatation noted. There is dilatation of the aortic root, measuring 40 mm.  8. The inferior vena cava is normal in size with greater than 50% respiratory variability, suggesting right atrial pressure of 3 mmHg. Comparison(s): 11/21-EF 55-60%. FINDINGS  Left Ventricle: Left ventricular ejection fraction, by estimation, is 55 to 60%. The left ventricle has normal function. The left ventricle has no regional wall motion abnormalities. The left ventricular internal cavity size was normal in size. There is  mild left ventricular hypertrophy. Left ventricular diastolic parameters are indeterminate. Right Ventricle: The right ventricular size is normal. Right vetricular wall thickness was not well visualized. Right ventricular systolic function is mildly reduced.  There is normal pulmonary artery systolic pressure. The tricuspid regurgitant velocity is 2.29 m/s, and with an assumed right atrial pressure of 3 mmHg, the estimated right ventricular systolic pressure is 24.0 mmHg. Left Atrium: Left atrial size was severely dilated. Right Atrium: Right atrial size was mildly dilated. Pericardium: Trivial pericardial effusion is present. Mitral Valve: The mitral valve is normal in structure. Trivial mitral valve regurgitation. Tricuspid Valve: The tricuspid valve is normal in structure. Tricuspid valve regurgitation is trivial. Aortic Valve: The aortic valve was not well visualized. Aortic valve regurgitation is trivial. No aortic stenosis is present. Aortic valve mean gradient measures 3.0 mmHg. Aortic valve peak gradient measures 6.0 mmHg. Aortic valve area, by VTI measures 2.30 cm. Pulmonic Valve: The pulmonic valve was not well visualized. Pulmonic valve regurgitation is not visualized. Aorta: The aortic root and ascending aorta are structurally normal, with no evidence of dilitation and aortic dilatation noted. There is dilatation of the aortic root, measuring 40 mm. Venous: The inferior vena cava is normal in size with greater than 50% respiratory variability, suggesting right atrial pressure of 3 mmHg. IAS/Shunts: The interatrial septum was not well visualized.  LEFT VENTRICLE PLAX 2D LVIDd:         4.60 cm   Diastology LVIDs:         2.30 cm   LV e' medial:    6.49 cm/s LV PW:         1.00 cm   LV E/e' medial:  10.4 LV IVS:        1.30 cm   LV e' lateral:   7.54 cm/s LVOT diam:     1.90 cm   LV E/e' lateral: 9.0 LV SV:         47 LV SV Index:   26 LVOT Area:     2.84 cm  RIGHT VENTRICLE            IVC RV S prime:     6.08 cm/s  IVC diam: 1.90 cm LEFT ATRIUM  Index        RIGHT ATRIUM           Index LA diam:        4.60 cm  2.52 cm/m   RA Area:     20.00 cm LA Vol (A2C):   102.0 ml 55.79 ml/m  RA Volume:   56.40 ml  30.85 ml/m LA Vol (A4C):   103.0 ml  56.34 ml/m LA Biplane Vol: 104.0 ml 56.89 ml/m  AORTIC VALVE                    PULMONIC VALVE AV Area (Vmax):    2.33 cm     PV Vmax:       0.90 m/s AV Area (Vmean):   2.28 cm     PV Peak grad:  3.2 mmHg AV Area (VTI):     2.30 cm AV Vmax:           122.60 cm/s AV Vmean:          79.000 cm/s AV VTI:            0.206 m AV Peak Grad:      6.0 mmHg AV Mean Grad:      3.0 mmHg LVOT Vmax:         100.95 cm/s LVOT Vmean:        63.525 cm/s LVOT VTI:          0.167 m LVOT/AV VTI ratio: 0.81  AORTA Ao Root diam: 4.00 cm Ao Asc diam:  3.70 cm MITRAL VALVE               TRICUSPID VALVE MV Area (PHT): 4.26 cm    TR Peak grad:   21.0 mmHg MV Decel Time: 178 msec    TR Vmax:        229.00 cm/s MV E velocity: 67.60 cm/s MV A velocity: 31.00 cm/s  SHUNTS MV E/A ratio:  2.18        Systemic VTI:  0.17 m                            Systemic Diam: 1.90 cm Epifanio Lesches MD Electronically signed by Epifanio Lesches MD Signature Date/Time: 06/29/2023/9:55:36 AM    Final    DG Chest 2 View  Result Date: 06/27/2023 CLINICAL DATA:  Chest pain. EXAM: CHEST - 2 VIEW COMPARISON:  Chest radiograph dated 05/29/2023. FINDINGS: Left lung base scarring. No focal consolidation, pleural effusion, or pneumothorax. Stable enlargement of the cardiac silhouette. No acute osseous pathology. Degenerative changes of the spine and shoulders. IMPRESSION: 1. No active cardiopulmonary disease. 2. Cardiomegaly. Electronically Signed   By: Elgie Collard M.D.   On: 06/27/2023 19:12     Subjective: Patient was seen and examined at bedside.  Overnight events noted.   Patient reports doing much better and wants to be discharged.  Denies any chest pain.  Discharge Exam: Vitals:   06/30/23 0845 06/30/23 1218  BP:  (!) 102/54  Pulse: (!) 56   Resp:  18  Temp:  98.9 F (37.2 C)  SpO2:     Vitals:   06/30/23 0332 06/30/23 0732 06/30/23 0845 06/30/23 1218  BP: 108/73 117/74  (!) 102/54  Pulse: 62 73 (!) 56   Resp: 18 20  18    Temp: 98.8 F (37.1 C) 98.7 F (37.1 C)  98.9 F (37.2 C)  TempSrc: Oral Oral  Oral  SpO2:  93% 98%    Weight:      Height:        General: Pt is alert, awake, not in acute distress Cardiovascular: RRR, S1/S2 +, no rubs, no gallops Respiratory: CTA bilaterally, no wheezing, no rhonchi Abdominal: Soft, NT, ND, bowel sounds + Extremities: no edema, no cyanosis    The results of significant diagnostics from this hospitalization (including imaging, microbiology, ancillary and laboratory) are listed below for reference.     Microbiology: Recent Results (from the past 240 hour(s))  SARS Coronavirus 2 by RT PCR (hospital order, performed in [facility] hospital lab) *cepheid single result test* Anterior Nasal Swab     Status: None   Collection Time: 06/27/23 10:35 PM   Specimen: Anterior Nasal Swab  Result Value Ref Range Status   SARS Coronavirus 2 by RT PCR NEGATIVE NEGATIVE Final    Comment: Performed at [facility], [address], [place], [place] [address]  MRSA Next Gen by PCR, Nasal     Status: None   Collection Time: 06/28/23  3:02 PM   Specimen: Nasal Mucosa; Nasal Swab  Result Value Ref Range Status   MRSA by PCR Next Gen NOT DETECTED NOT DETECTED Final    Comment: (NOTE) The GeneXpert MRSA Assay (FDA approved for NASAL specimens only), is one component of a comprehensive MRSA colonization surveillance program. It is not intended to diagnose MRSA infection nor to guide or monitor treatment for MRSA infections. Test performance is not FDA approved in patients less than 52 years old. Performed at [facility], [address], [place], [place] [address]      Labs: BNP (last 3 results) Recent Labs    07/13/22 0400 07/14/22 0325 07/15/22 0121  BNP 263.3* 385.9* 339.5*   Basic Metabolic Panel: Recent Labs  Lab 06/27/23 1645 06/28/23 0550 06/29/23 0935 06/30/23 0939  NA 144 139 138 138  K 3.6 3.4* 3.5 4.1  CL 103 101 100 103  CO2 24 25 28 28    GLUCOSE 84 112* 109* 97  BUN 7* 7* 10 13  CREATININE 0.79 0.77 0.92 0.87  CALCIUM 8.4* 8.1* 8.7* 8.5*  MG  --  1.1* 1.6* 2.5*  PHOS  --   --   --  2.6   Liver Function Tests: Recent Labs  Lab 06/28/23 0550 06/30/23 0939  AST 88* 36  ALT 61* 35  ALKPHOS 68 59  BILITOT 2.6* 0.9  PROT 6.0* 5.3*  ALBUMIN 3.2* 2.7*   No results for input(s): "LIPASE", "AMYLASE" in the last 168 hours. No results for input(s): "AMMONIA" in the last 168 hours. CBC: Recent Labs  Lab 06/27/23 1645 06/30/23 0230  WBC 3.8* 6.1  HGB 14.1 11.4*  HCT 41.9 33.4*  MCV 101.0* 103.1*  PLT 248 190   Cardiac Enzymes: Recent Labs  Lab 06/28/23 0550  CKTOTAL 53   BNP: Invalid input(s): "POCBNP" CBG: No results for input(s): "GLUCAP" in the last 168 hours. D-Dimer No results for input(s): "DDIMER" in the last 72 hours. Hgb A1c No results for input(s): "HGBA1C" in the last 72 hours. Lipid Profile No results for input(s): "CHOL", "HDL", "LDLCALC", "TRIG", "CHOLHDL", "LDLDIRECT" in the last 72 hours. Thyroid function studies Recent Labs    06/29/23 0227  TSH 1.665   Anemia work up No results for input(s): "VITAMINB12", "FOLATE", "FERRITIN", "TIBC", "IRON", "RETICCTPCT" in the last 72 hours. Urinalysis    Component Value Date/Time   COLORURINE AMBER (A) 06/27/2023 1954   APPEARANCEUR CLOUDY (  A) 06/27/2023 1954   LABSPEC 1.012 06/27/2023 1954   PHURINE 5.0 06/27/2023 1954   GLUCOSEU NEGATIVE 06/27/2023 1954   HGBUR NEGATIVE 06/27/2023 1954   BILIRUBINUR NEGATIVE 06/27/2023 1954   KETONESUR NEGATIVE 06/27/2023 1954   PROTEINUR 30 (A) 06/27/2023 1954   NITRITE NEGATIVE 06/27/2023 1954   LEUKOCYTESUR LARGE (A) 06/27/2023 1954   Sepsis Labs Recent Labs  Lab 06/27/23 1645 06/30/23 0230  WBC 3.8* 6.1   Microbiology Recent Results (from the past 240 hour(s))  SARS Coronavirus 2 by RT PCR (hospital order, performed in [facility] hospital lab) *cepheid single result test* Anterior Nasal  Swab     Status: None   Collection Time: 06/27/23 10:35 PM   Specimen: Anterior Nasal Swab  Result Value Ref Range Status   SARS Coronavirus 2 by RT PCR NEGATIVE NEGATIVE Final    Comment: Performed at [facility], [address] [place], [place] [address]  MRSA Next Gen by PCR, Nasal     Status: None   Collection Time: 06/28/23  3:02 PM   Specimen: Nasal Mucosa; Nasal Swab  Result Value Ref Range Status   MRSA by PCR Next Gen NOT DETECTED NOT DETECTED Final    Comment: (NOTE) The GeneXpert MRSA Assay (FDA approved for NASAL specimens only), is one component of a comprehensive MRSA colonization surveillance program. It is not intended to diagnose MRSA infection nor to guide or monitor treatment for MRSA infections. Test performance is not FDA approved in patients less than 17 years old. Performed at [facility], [address], [place], [place] [address]      Time coordinating discharge: Over 30 minutes  SIGNED:   Willeen Niece, MD  [facility] 06/30/2023, 2:37 PM Pager   If 7PM-7AM, please contact night-coverage [email or web address] Password [email or web address]

## 2023-06-30 NOTE — Discharge Instructions (Signed)
Advised to follow-up with primary care physician in 1 week. Advised to follow-up with cardiologist Dr. Gaynelle Arabian in 1 week. Advised to take metoprolol 12.5 mg every 12 hours for A-fib. Continue Eliquis for A-fib

## 2023-06-30 NOTE — Plan of Care (Signed)

## 2023-06-30 NOTE — Progress Notes (Signed)
   06/30/23 1142  Spiritual Encounters  Type of Visit Follow up  Care provided to: Patient  Reason for visit Routine spiritual support   Chaplain attempted to respond to Spiritual Consult but Pt had just received lunch.  Spiritual Care team will continue to follow

## 2023-07-08 ENCOUNTER — Encounter (HOSPITAL_BASED_OUTPATIENT_CLINIC_OR_DEPARTMENT_OTHER): Payer: Self-pay | Admitting: *Deleted

## 2023-07-08 ENCOUNTER — Emergency Department (HOSPITAL_BASED_OUTPATIENT_CLINIC_OR_DEPARTMENT_OTHER)
Admission: EM | Admit: 2023-07-08 | Discharge: 2023-07-08 | Disposition: A | Discharge status: Home / Self Care | Payer: Medicare Other | Attending: Emergency Medicine | Admitting: Emergency Medicine

## 2023-07-08 ENCOUNTER — Other Ambulatory Visit: Payer: Self-pay

## 2023-07-08 DIAGNOSIS — H0520 Unspecified exophthalmos: Secondary | ICD-10-CM | POA: Diagnosis not present

## 2023-07-08 DIAGNOSIS — Z7901 Long term (current) use of anticoagulants: Secondary | ICD-10-CM | POA: Diagnosis not present

## 2023-07-08 DIAGNOSIS — R109 Unspecified abdominal pain: Secondary | ICD-10-CM | POA: Insufficient documentation

## 2023-07-08 LAB — COMPREHENSIVE METABOLIC PANEL
ALT: 43 U/L (ref 0–44)
AST: 66 U/L — ABNORMAL HIGH (ref 15–41)
Albumin: 3.9 g/dL (ref 3.5–5.0)
Alkaline Phosphatase: 59 U/L (ref 38–126)
Anion gap: 10 (ref 5–15)
BUN: 12 mg/dL (ref 8–23)
CO2: 32 mmol/L (ref 22–32)
Calcium: 8.2 mg/dL — ABNORMAL LOW (ref 8.9–10.3)
Chloride: 104 mmol/L (ref 98–111)
Creatinine, Ser: 0.82 mg/dL (ref 0.61–1.24)
GFR, Estimated: >60 mL/min (ref >60)
Glucose, Bld: 89 mg/dL (ref 70–99)
Potassium: 3.4 mmol/L — ABNORMAL LOW (ref 3.5–5.1)
Sodium: 146 mmol/L — ABNORMAL HIGH (ref 135–145)
Total Bilirubin: 1 mg/dL (ref <1.2)
Total Protein: 6.6 g/dL (ref 6.5–8.1)

## 2023-07-08 LAB — CBC
HCT: 35.6 % — ABNORMAL LOW (ref 39.0–52.0)
Hemoglobin: 12 g/dL — ABNORMAL LOW (ref 13.0–17.0)
MCH: 34.7 pg — ABNORMAL HIGH (ref 26.0–34.0)
MCHC: 33.7 g/dL (ref 30.0–36.0)
MCV: 102.9 fL — ABNORMAL HIGH (ref 80.0–100.0)
Platelets: 144 10*3/uL — ABNORMAL LOW (ref 150–400)
RBC: 3.46 MIL/uL — ABNORMAL LOW (ref 4.22–5.81)
RDW: 14.4 % (ref 11.5–15.5)
WBC: 4 10*3/uL (ref 4.0–10.5)
nRBC: 0 % (ref 0.0–0.2)

## 2023-07-08 LAB — URINALYSIS, ROUTINE W REFLEX MICROSCOPIC
Bacteria, UA: NONE SEEN
Bilirubin Urine: NEGATIVE
Glucose, UA: NEGATIVE mg/dL
Ketones, ur: NEGATIVE mg/dL
Leukocytes,Ua: NEGATIVE
Nitrite: NEGATIVE
Specific Gravity, Urine: 1.011 (ref 1.005–1.030)
pH: 6 (ref 5.0–8.0)

## 2023-07-08 LAB — LIPASE, BLOOD: Lipase: 32 U/L (ref 11–51)

## 2023-07-08 MED ORDER — ACETAMINOPHEN 325 MG PO TABS
650.0000 mg | ORAL_TABLET | Freq: Once | ORAL | Status: AC
  Administered 2023-07-08: 650 mg via ORAL
  Filled 2023-07-08: qty 2

## 2023-07-08 NOTE — ED Notes (Signed)
Discharge instructions and follow up care reviewed and explained, pt verbalized understanding and had no further questions on d/c. Taxi voucher provided.

## 2023-07-08 NOTE — Discharge Instructions (Addendum)
Make sure to stay hydrated.  Labs are normal today.  Continue on your current medications.

## 2023-07-08 NOTE — ED Provider Notes (Signed)
[place] EMERGENCY DEPARTMENT AT [facility] Provider Note   CSN: [id number] Arrival date & time: 07/08/23  9629     History  Chief Complaint  Patient presents with   Abdominal Pain    Taylor Myers is a 72 y.o. male.  Pt is a 71y/o male with PMH significant for chronic atrial fibrillation on Eliquis and heavy alcohol use who is presenting today from home due to onset of severe pain in his left abdomen that radiates down into his groin.  Patient reports it woke him up he thinks around 3 AM this morning.  He describes it as a severe pain that made him double over.  He reports now the pain is gone and he feels quite peaceful.  He is denying any fever, dysuria, chest pain, shortness of breath.  He reports being compliant with his Eliquis and metoprolol.  He has no further complaints at this time.  The history is provided by the patient and medical records.  Abdominal Pain      Home Medications Prior to Admission medications   Medication Sig Start Date End Date Taking? Authorizing Provider  apixaban (ELIQUIS) 5 MG TABS tablet Take 1 tablet (5 mg total) by mouth 2 (two) times daily. 05/12/23   Little Ishikawa, MD  DHA-EPA-Flaxseed Oil-Vitamin E [facility] NUTRITION) CAPS Take 1 capsule by mouth daily.    [provider]  folic acid (FOLVITE) 1 MG tablet Take 1 tablet (1 mg total) by mouth daily. 07/01/23 07/31/23  Willeen Niece, MD  ibuprofen (ADVIL) 600 MG tablet Take 600 mg by mouth every 6 (six) hours as needed for moderate pain (pain score 4-6).    [provider]  metoprolol tartrate (LOPRESSOR) 25 MG tablet Take 0.5 tablets (12.5 mg total) by mouth 2 (two) times daily. 06/30/23 08/29/23  Willeen Niece, MD  Multiple Vitamins-Minerals (LIVER DETOX PO) Take 2 tablets by mouth daily.    [provider]  NON FORMULARY Take 2 capsules by mouth See admin instructions. Beet extract capsules- Take 2 capsules by mouth once a day    [provider]  tamsulosin (FLOMAX) 0.4 MG CAPS capsule Take 0.4 mg by mouth daily.    [provider]  thiamine (VITAMIN B-1) 100 MG tablet Take 1 tablet (100 mg total) by mouth daily. 07/01/23 07/31/23  Willeen Niece, MD      Allergies    Patient has no known allergies.    Review of Systems   Review of Systems  Gastrointestinal:  Positive for abdominal pain.    Physical Exam Updated Vital Signs BP 116/69   Pulse 80   Temp 98.4 F (36.9 C)   Resp 18   SpO2 90%  Physical Exam Vitals and nursing note reviewed.  Constitutional:      General: He is not in acute distress.    Appearance: He is well-developed.     Comments: Slightly disheveled  HENT:     Head: Normocephalic and atraumatic.  Eyes:     Conjunctiva/sclera: Conjunctivae normal.     Comments: Mild proptosis of the right eye with surrounding fullness, minimal corneal injection.  Left eye is normal  Cardiovascular:     Rate and Rhythm: Normal rate. Rhythm irregularly irregular.     Heart sounds: No murmur heard. Pulmonary:     Effort: Pulmonary effort is normal. No respiratory distress.     Breath sounds: Normal breath sounds. No wheezing or rales.  Abdominal:     General: There is  no distension.     Palpations: Abdomen is soft.     Tenderness: There is no abdominal tenderness. There is no right CVA tenderness, left CVA tenderness, guarding or rebound.     Comments: Ecchymosis which is healing in the right lower abdomen  Genitourinary:    Penis: Normal.      Testes: Normal.  Musculoskeletal:        General: No tenderness. Normal range of motion.     Cervical back: Normal range of motion and neck supple.  Skin:    General: Skin is warm and dry.     Findings: No erythema or rash.  Neurological:     Mental Status: He is alert and oriented to person, place, and time. Mental status is at baseline.  Psychiatric:        Behavior: Behavior normal.     ED Results / Procedures / Treatments   Labs (all  labs ordered are listed, but only abnormal results are displayed) Labs Reviewed  COMPREHENSIVE METABOLIC PANEL - Abnormal; Notable for the following components:      Result Value   Sodium 146 (*)    Potassium 3.4 (*)    Calcium 8.2 (*)    AST 66 (*)    All other components within normal limits  CBC - Abnormal; Notable for the following components:   RBC 3.46 (*)    Hemoglobin 12.0 (*)    HCT 35.6 (*)    MCV 102.9 (*)    MCH 34.7 (*)    Platelets 144 (*)    All other components within normal limits  URINALYSIS, ROUTINE W REFLEX MICROSCOPIC - Abnormal; Notable for the following components:   Hgb urine dipstick SMALL (*)    Protein, ur TRACE (*)    All other components within normal limits  LIPASE, BLOOD    EKG None  Radiology No results found.  Procedures Procedures    Medications Ordered in ED Medications  acetaminophen (TYLENOL) tablet 650 mg (650 mg Oral Given 07/08/23 4010)    ED Course/ Medical Decision Making/ A&P                                 Medical Decision Making Amount and/or Complexity of Data Reviewed External Data Reviewed: notes. Labs: ordered. Decision-making details documented in ED Course.  Risk OTC drugs.   Pt with multiple medical problems and comorbidities and presenting today with a complaint that caries a high risk for morbidity and mortality.  Here today with complaint of sudden onset of pain which is now resolved.  Concern for possible kidney stone as patient did have a noted calculus on a CT scan in 2022.  He does have a history of atrial fibrillation but reports he is compliant with his Eliquis and is not having symptoms consistent with mesenteric ischemia at this time.  Patient's abdomen is soft and nontender with low suspicion for perforation diverticulitis, appendicitis.  Normal testicles with no evidence of hernia and low suspicion for torsion.  Patient does have a history of atrial fibrillation but this is chronic and rates are  controlled at this time. I independently interpreted patient's labs and lipase is within normal limits today, CMP with minimal hypernatremia of 146, normal creatinine and AST mildly elevated at 66 most likely related to his regular alcohol use, CBC without acute changes. Ua with trace blood but nothing else.  On re-eval pt still feeling fine and denies  pain.  Do not feel pt needs imaging today.  Stable for d/c.  To return for worsening sx.        Final Clinical Impression(s) / ED Diagnoses Final diagnoses:  Abdominal pain, unspecified abdominal location    Rx / DC Orders ED Discharge Orders     None         Gwyneth Sprout, MD 07/08/23 1004

## 2023-07-08 NOTE — ED Triage Notes (Signed)
Pt arrives from home via [facility]. Per report, pt having. Lower abdominal pain. Feels like a stabbing sensation. No other complaints. Recently seen for kidney stones. En route, 140 palpated, hr 80, rr 18, 95% RA, cbg 111.

## 2023-07-19 ENCOUNTER — Encounter (HOSPITAL_COMMUNITY): Payer: Self-pay | Admitting: Emergency Medicine

## 2023-07-19 ENCOUNTER — Emergency Department (HOSPITAL_COMMUNITY)
Admission: EM | Admit: 2023-07-19 | Discharge: 2023-07-19 | Disposition: A | Discharge status: Home / Self Care | Payer: Medicare Other | Attending: Emergency Medicine | Admitting: Emergency Medicine

## 2023-07-19 ENCOUNTER — Emergency Department (HOSPITAL_COMMUNITY): Payer: Medicare Other

## 2023-07-19 ENCOUNTER — Other Ambulatory Visit: Payer: Self-pay

## 2023-07-19 DIAGNOSIS — Z7901 Long term (current) use of anticoagulants: Secondary | ICD-10-CM | POA: Insufficient documentation

## 2023-07-19 DIAGNOSIS — G8929 Other chronic pain: Secondary | ICD-10-CM | POA: Insufficient documentation

## 2023-07-19 DIAGNOSIS — W06XXXA Fall from bed, initial encounter: Secondary | ICD-10-CM | POA: Diagnosis not present

## 2023-07-19 DIAGNOSIS — Z79899 Other long term (current) drug therapy: Secondary | ICD-10-CM | POA: Diagnosis not present

## 2023-07-19 DIAGNOSIS — S0990XA Unspecified injury of head, initial encounter: Secondary | ICD-10-CM | POA: Diagnosis present

## 2023-07-19 DIAGNOSIS — M542 Cervicalgia: Secondary | ICD-10-CM | POA: Insufficient documentation

## 2023-07-19 DIAGNOSIS — M545 Low back pain, unspecified: Secondary | ICD-10-CM | POA: Insufficient documentation

## 2023-07-19 DIAGNOSIS — I10 Essential (primary) hypertension: Secondary | ICD-10-CM | POA: Diagnosis not present

## 2023-07-19 DIAGNOSIS — I4891 Unspecified atrial fibrillation: Secondary | ICD-10-CM | POA: Diagnosis not present

## 2023-07-19 DIAGNOSIS — W19XXXA Unspecified fall, initial encounter: Secondary | ICD-10-CM

## 2023-07-19 NOTE — ED Triage Notes (Signed)
Pt here from home with c/o fall from w/c when trying to get  into his chair from his bed , is on thinners but did not hit his head, has chronic pain

## 2023-07-19 NOTE — Discharge Instructions (Addendum)
Your CT scan of the head and neck did not show any signs of brain bleed, fracture, or dislocation.  You may take up to 1000mg  of tylenol every 6 hours as needed for pain.  Do not take more then 4g per day.  Return to the ER if you have any severe headache, loss of consciousness, changes in vision, uncontrolled vomiting, any other new or concerning symptoms.

## 2023-07-19 NOTE — ED Notes (Signed)
Went to CT

## 2023-07-19 NOTE — ED Provider Notes (Signed)
[place] EMERGENCY DEPARTMENT AT [facility] Provider Note   CSN: [id number] Arrival date & time: 07/19/23  1324     History  Chief Complaint  Patient presents with   Taylor Myers is a 72 y.o. male with A-fib on Eliquis, wheelchair dependent, hypertension, presents with concern for a fall about 1 hour prior to arrival.  States he was transferring from the toilet over to his wheelchair, and missed the wheelchair.  He then fell to the ground. Denies any dizziness, chest pain, or shortness of breath before the fall. He was on the ground for about half hour before EMS arrived.  He denies any loss of consciousness, did not hit his head.  Reports he has some pain in his neck.  Reports chronic lower back pain as well.   Fall       Home Medications Prior to Admission medications   Medication Sig Start Date End Date Taking? Authorizing Provider  apixaban (ELIQUIS) 5 MG TABS tablet Take 1 tablet (5 mg total) by mouth 2 (two) times daily. 05/12/23   Little Ishikawa, MD  DHA-EPA-Flaxseed Oil-Vitamin E [facility] NUTRITION) CAPS Take 1 capsule by mouth daily.    [provider]  folic acid (FOLVITE) 1 MG tablet Take 1 tablet (1 mg total) by mouth daily. 07/01/23 07/31/23  Willeen Niece, MD  ibuprofen (ADVIL) 600 MG tablet Take 600 mg by mouth every 6 (six) hours as needed for moderate pain (pain score 4-6).    [provider]  metoprolol tartrate (LOPRESSOR) 25 MG tablet Take 0.5 tablets (12.5 mg total) by mouth 2 (two) times daily. 06/30/23 08/29/23  Willeen Niece, MD  Multiple Vitamins-Minerals (LIVER DETOX PO) Take 2 tablets by mouth daily.    [provider]  NON FORMULARY Take 2 capsules by mouth See admin instructions. Beet extract capsules- Take 2 capsules by mouth once a day    [provider]  tamsulosin (FLOMAX) 0.4 MG CAPS capsule Take 0.4 mg by mouth daily.    [provider]  thiamine (VITAMIN B-1) 100 MG  tablet Take 1 tablet (100 mg total) by mouth daily. 07/01/23 07/31/23  Willeen Niece, MD      Allergies    Patient has no known allergies.    Review of Systems   Review of Systems  Musculoskeletal:  Positive for neck pain.    Physical Exam Updated Vital Signs BP 120/72   Pulse 83   Temp 98.8 F (37.1 C) (Oral)   Resp 17   SpO2 100%  Physical Exam Vitals and nursing note reviewed.  Constitutional:      General: He is not in acute distress.    Appearance: He is well-developed.  HENT:     Head: Normocephalic and atraumatic.     Comments: No tenderness palpation of the skull diffusely, no step-offs noted Eyes:     Conjunctiva/sclera: Conjunctivae normal.     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     Heart sounds: No murmur heard. Pulmonary:     Effort: Pulmonary effort is normal. No respiratory distress.     Breath sounds: Normal breath sounds.  Abdominal:     Palpations: Abdomen is soft.     Tenderness: There is no abdominal tenderness.  Musculoskeletal:        General: No swelling. Normal range of motion.     Cervical back:  Neck supple.     Comments: No obvious deformity or tenderness palpation diffusely of the bilateral upper extremities, chest wall, bilateral lower extremities  Tender palpation of the cervical spine, no tenderness palpation of the thoracic or lumbar spine  Able to sit up and move around without difficulty in bed  Skin:    General: Skin is warm and dry.     Capillary Refill: Capillary refill takes less than 2 seconds.     Comments: No abrasions or ecchymoses  Neurological:     Mental Status: He is alert.  Psychiatric:        Mood and Affect: Mood normal.     ED Results / Procedures / Treatments   Labs (all labs ordered are listed, but only abnormal results are displayed) Labs Reviewed - No data to display  EKG None  Radiology CT Head Wo  Contrast  Result Date: 07/19/2023 CLINICAL DATA:  Head trauma, minor (Age >= 65y) Fall on thinners; Neck trauma (Age >= 65y) EXAM: CT HEAD WITHOUT CONTRAST CT CERVICAL SPINE WITHOUT CONTRAST TECHNIQUE: Multidetector CT imaging of the head and cervical spine was performed following the standard protocol without intravenous contrast. Multiplanar CT image reconstructions of the cervical spine were also generated. RADIATION DOSE REDUCTION: This exam was performed according to the departmental dose-optimization program which includes automated exposure control, adjustment of the mA and/or kV according to patient size and/or use of iterative reconstruction technique. COMPARISON:  CT head and CT cervical spine May 29, 2023. FINDINGS: CT HEAD FINDINGS Brain: Remote left cerebellar infarct. Cerebral atrophy. No evidence of acute large vascular territory infarct, acute hemorrhage, mass lesion, midline shift or hydrocephalus. Vascular: Calcific atherosclerosis. Skull: No acute fracture. Sinuses/Orbits: Mostly clear sinuses.  No acute orbital findings. Other: No mastoid effusions. CT CERVICAL SPINE FINDINGS Alignment: Similar degenerative anterolisthesis of C4 on C5 and C7 on T1. scoliosis. Skull base and vertebrae: No evidence of acute fracture. Vertebral body heights are maintained. Soft tissues and spinal canal: No prevertebral fluid or swelling. No visible canal hematoma. Disc levels: Similar multilevel degenerative change including severe degenerative disease at C7-T1 and bridging anterior osteophytes in the mid upper cervical spine. Multilevel facet and uncovertebral hypertrophy with varying degrees of neural foraminal stenosis. Upper chest: Visualized lung apices are clear. IMPRESSION: No evidence of acute abnormality intracranially or in the cervical spine. Electronically Signed   By: Feliberto Harts M.D.   On: 07/19/2023 15:53   CT Cervical Spine Wo Contrast  Result Date: 07/19/2023 CLINICAL DATA:  Head  trauma, minor (Age >= 65y) Fall on thinners; Neck trauma (Age >= 65y) EXAM: CT HEAD WITHOUT CONTRAST CT CERVICAL SPINE WITHOUT CONTRAST TECHNIQUE: Multidetector CT imaging of the head and cervical spine was performed following the standard protocol without intravenous contrast. Multiplanar CT image reconstructions of the cervical spine were also generated. RADIATION DOSE REDUCTION: This exam was performed according to the departmental dose-optimization program which includes automated exposure control, adjustment of the mA and/or kV according to patient size and/or use of iterative reconstruction technique. COMPARISON:  CT head and CT cervical spine May 29, 2023. FINDINGS: CT HEAD FINDINGS Brain: Remote left cerebellar infarct. Cerebral atrophy. No evidence of acute large vascular territory infarct, acute hemorrhage, mass lesion, midline shift or hydrocephalus. Vascular: Calcific atherosclerosis. Skull: No acute fracture. Sinuses/Orbits: Mostly clear sinuses.  No acute orbital findings. Other: No mastoid effusions. CT CERVICAL SPINE FINDINGS Alignment: Similar degenerative anterolisthesis of C4 on C5 and C7 on T1. scoliosis. Skull base and vertebrae: No evidence  of acute fracture. Vertebral body heights are maintained. Soft tissues and spinal canal: No prevertebral fluid or swelling. No visible canal hematoma. Disc levels: Similar multilevel degenerative change including severe degenerative disease at C7-T1 and bridging anterior osteophytes in the mid upper cervical spine. Multilevel facet and uncovertebral hypertrophy with varying degrees of neural foraminal stenosis. Upper chest: Visualized lung apices are clear. IMPRESSION: No evidence of acute abnormality intracranially or in the cervical spine. Electronically Signed   By: Feliberto Harts M.D.   On: 07/19/2023 15:53    Procedures Procedures    Medications Ordered in ED Medications - No data to display  ED Course/ Medical Decision Making/ A&P                                  Medical Decision Making Amount and/or Complexity of Data Reviewed Radiology: ordered.     Differential diagnosis includes but is not limited to mechanical fall, intracranial hemorrhage, fracture, dislocation  ED Course:  Patient well-appearing, no acute distress.  Vital signs stable.  He had a mechanical fall earlier today while transferring to his wheelchair.  He was not on the ground for very long before EMS arrived, low suspicion for any rhabdomyolysis.  No suspicion for any other cause to his fall given good story for mechanical fall and no prodromal symptoms, no loss of consciousness, feels at his baseline currently.  He does have cervical spine tenderness to palpation on exam, CT cervical spine obtained which showed no acute normalities.  Due to patient being on blood thinners, CT head was obtained which showed no acute abnormalities.  Patient with no tenderness palpation elsewhere of the upper and lower extremities bilaterally, baseline range of motion of the upper and lower extremities bilaterally, no indication for further imaging.  Patient declines any pain medication.  Patient appropriate for discharge home at this time.  Impression: Mechanical fall  Disposition:  The patient was discharged home with instructions to take Tylenol as needed for pain. Return precautions given.   Imaging Studies ordered: I ordered imaging studies including CT head, CT cervical spine I independently visualized the imaging with scope of interpretation limited to determining acute life threatening conditions related to emergency care. Imaging showed no acute abnormalities I agree with the radiologist interpretation             Final Clinical Impression(s) / ED Diagnoses Final diagnoses:  Fall, initial encounter    Rx / DC Orders ED Discharge Orders     None         Arabella Merles, Cordelia Poche 07/19/23 1633    Gloris Manchester, MD 07/19/23 [phone]

## 2023-07-20 ENCOUNTER — Emergency Department (HOSPITAL_BASED_OUTPATIENT_CLINIC_OR_DEPARTMENT_OTHER)
Admission: EM | Admit: 2023-07-20 | Discharge: 2023-07-20 | Disposition: A | Discharge status: Home / Self Care | Payer: Medicare Other | Attending: Emergency Medicine | Admitting: Emergency Medicine

## 2023-07-20 ENCOUNTER — Other Ambulatory Visit: Payer: Self-pay

## 2023-07-20 ENCOUNTER — Encounter (HOSPITAL_BASED_OUTPATIENT_CLINIC_OR_DEPARTMENT_OTHER): Payer: Self-pay

## 2023-07-20 ENCOUNTER — Emergency Department (HOSPITAL_BASED_OUTPATIENT_CLINIC_OR_DEPARTMENT_OTHER): Payer: Medicare Other | Admitting: Radiology

## 2023-07-20 DIAGNOSIS — M545 Low back pain, unspecified: Secondary | ICD-10-CM | POA: Insufficient documentation

## 2023-07-20 DIAGNOSIS — I10 Essential (primary) hypertension: Secondary | ICD-10-CM | POA: Insufficient documentation

## 2023-07-20 DIAGNOSIS — M25511 Pain in right shoulder: Secondary | ICD-10-CM | POA: Insufficient documentation

## 2023-07-20 DIAGNOSIS — W19XXXA Unspecified fall, initial encounter: Secondary | ICD-10-CM

## 2023-07-20 DIAGNOSIS — J45909 Unspecified asthma, uncomplicated: Secondary | ICD-10-CM | POA: Diagnosis not present

## 2023-07-20 DIAGNOSIS — M25512 Pain in left shoulder: Secondary | ICD-10-CM | POA: Diagnosis not present

## 2023-07-20 DIAGNOSIS — Z79899 Other long term (current) drug therapy: Secondary | ICD-10-CM | POA: Diagnosis not present

## 2023-07-20 DIAGNOSIS — Z7901 Long term (current) use of anticoagulants: Secondary | ICD-10-CM | POA: Diagnosis not present

## 2023-07-20 DIAGNOSIS — W06XXXA Fall from bed, initial encounter: Secondary | ICD-10-CM | POA: Diagnosis not present

## 2023-07-20 DIAGNOSIS — Z87891 Personal history of nicotine dependence: Secondary | ICD-10-CM | POA: Insufficient documentation

## 2023-07-20 NOTE — Discharge Instructions (Addendum)
As discussed, workup today overall reassuring.  X-rays of your shoulders as well as low back did not show any evidence of fracture/dislocation.  Recommend use of Tylenol at home for treatment of pain as well as icing affected areas.  See information attached to discharge papers regarding fall prevention at home.  Please do not hesitate to return to emergency department if there are worrisome signs and symptoms we discussed become apparent.

## 2023-07-20 NOTE — ED Notes (Signed)
Dc instructions reviewed with patient. Patient voiced understanding. Dc with belongings.  °

## 2023-07-20 NOTE — ED Provider Notes (Signed)
[name] AT [facility] Provider Note   CSN: [id number] Arrival date & time: 07/20/23  1028     History  Chief Complaint  Patient presents with   Taylor Myers is a 72 y.o. male.   Fall   72 year old male presents emergency department after a fall.  Patient states that he was attempting to get out of his bed into his wheelchair when the fall occurred.  States that he "slowly slid" off the edge of his bed landing on his bottom.  Denies any trauma to head, loss of consciousness.  Patient is on Eliquis secondary to A-fib.  Was seen yesterday in the ED for fall with negative CT imaging of his head and his neck.  States that he has bad peripheral vision and sometimes has difficulty transferring to and from his wheelchair.  States he lives at home with his 97 year old dad.  States he has home health coming to his house a few times a week and is not interested in any form of placement.  Denies any chest pain, shortness of breath, abdominal pain, nausea, vomiting.  Currently complaining of bilateral shoulder pain as well as low back pain.  States that he had pain in these areas before the fall but "they may be a little worse" since the fall.  Denies any new weakness/sensory deficits in lower extremities, saddle anesthesia, bowel/bladder dysfunction, history of IV drug use, fever, known malignancy, prolonged corticosteroid use.  Past medical history significant for atrial fibrillation on Eliquis, alcohol abuse, asthma, hypertension, BPH  Home Medications Prior to Admission medications   Medication Sig Start Date End Date Taking? Authorizing Provider  apixaban (ELIQUIS) 5 MG TABS tablet Take 1 tablet (5 mg total) by mouth 2 (two) times daily. 05/12/23   Little Ishikawa, MD  DHA-EPA-Flaxseed Oil-Vitamin E [facility] NUTRITION) CAPS Take 1 capsule by mouth daily.    [provider]  folic acid (FOLVITE) 1 MG tablet Take 1 tablet (1 mg total)  by mouth daily. 07/01/23 07/31/23  Willeen Niece, MD  ibuprofen (ADVIL) 600 MG tablet Take 600 mg by mouth every 6 (six) hours as needed for moderate pain (pain score 4-6).    [provider]  metoprolol tartrate (LOPRESSOR) 25 MG tablet Take 0.5 tablets (12.5 mg total) by mouth 2 (two) times daily. 06/30/23 08/29/23  Willeen Niece, MD  Multiple Vitamins-Minerals (LIVER DETOX PO) Take 2 tablets by mouth daily.    [provider]  NON FORMULARY Take 2 capsules by mouth See admin instructions. Beet extract capsules- Take 2 capsules by mouth once a day    [provider]  tamsulosin (FLOMAX) 0.4 MG CAPS capsule Take 0.4 mg by mouth daily.    [provider]  thiamine (VITAMIN B-1) 100 MG tablet Take 1 tablet (100 mg total) by mouth daily. 07/01/23 07/31/23  Willeen Niece, MD      Allergies    Patient has no known allergies.    Review of Systems   Review of Systems  All other systems reviewed and are negative.   Physical Exam Updated Vital Signs BP (!) 139/90   Pulse 76   Temp 97.9 F (36.6 C) (Oral)   Resp 16   Ht 5\' 9"  (1.753 m)   Wt 70.3 kg   SpO2 97%   BMI 22.89 kg/m  Physical Exam Vitals and nursing note reviewed.  Constitutional:      General: He is not in acute distress.  Appearance: He is well-developed.  HENT:     Head: Normocephalic and atraumatic.     Comments: Right eye proptotic Eyes:     Conjunctiva/sclera: Conjunctivae normal.  Cardiovascular:     Rate and Rhythm: Normal rate and regular rhythm.     Heart sounds: No murmur heard. Pulmonary:     Effort: Pulmonary effort is normal. No respiratory distress.     Breath sounds: Normal breath sounds.  Abdominal:     Palpations: Abdomen is soft.     Tenderness: There is no abdominal tenderness.  Musculoskeletal:        General: No swelling.     Cervical back: Neck supple.     Comments: No midline tenderness of cervical, thoracic spine.  Slight midline tenderness L3-L5 lumbar  spine with right greater than left paraspinal tenderness.  No chest wall tenderness.  Tender palpation distal clavicle bilaterally as well as proximal humerus bilaterally.  Full range of motion bilateral shoulders, elbows, wrist, digits.  Muscular strength symmetric bilateral extremities ankle dorsi/plantarflexion, knee flexion/extension, hip flexion/extension.  No sensory deficits along major nerve distributions of lower extremities.  Pedal pulses 2+ bilaterally.  Radial pulses 2+ bilaterally.  Skin:    General: Skin is warm and dry.     Capillary Refill: Capillary refill takes less than 2 seconds.  Neurological:     Mental Status: He is alert.     Comments: Alert and oriented to self, place, time and event.   Speech is fluent, clear without dysarthria or dysphasia.   Strength symmetric in upper/lower extremities   Sensation intact in upper/lower extremities   Gait not assessed CN I not tested  CN II not tested CN III, IV, VI PERRLA and EOMs intact left  CN V Intact sensation to sharp and light touch to the left face.  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 CN VII facial movements symmetric  CN VIII not tested  CN IX, X no uvula deviation, symmetric rise of soft palate  CN XI 5/5 SCM and trapezius strength bilaterally  CN XII Midline tongue protrusion, symmetric L/R movements     Psychiatric:        Mood and Affect: Mood normal.     ED Results / Procedures / Treatments   Labs (all labs ordered are listed, but only abnormal results are displayed) Labs Reviewed - No data to display  EKG None  Radiology DG Shoulder Left  Result Date: 07/20/2023 CLINICAL DATA:  Fall during transfer from bed to wheelchair. Back and bilateral shoulder pain. EXAM: LEFT SHOULDER - 2+ VIEW; LUMBAR SPINE - COMPLETE 4+ VIEW; RIGHT SHOULDER - 2+ VIEW COMPARISON:  Lumbar spine  radiographs 01/07/2023 FINDINGS: Lumbar spine: The bones are demineralized. There are 5 lumbar type vertebral bodies. The alignment is stable with a mild convex right scoliosis and a grade 1 anterolisthesis at L4-5. Chronic compression deformities at T11, L4 and L5 are grossly stable. No evidence of acute fracture. Multilevel spondylosis with endplate osteophytes and facet hypertrophy. Aortoiliac atherosclerosis and previous right hip arthroplasty noted. Right shoulder: Osseous demineralization. No evidence of acute fracture or dislocation. There are moderate to advanced glenohumeral degenerative changes with joint space narrowing and osteophytes. Mild acromioclavicular degenerative changes. The subacromial space is mildly narrowed. Left shoulder: Osseous demineralization. No evidence of acute fracture or dislocation. There is posttraumatic deformity of the proximal humerus consistent with an old healed fracture.  Advanced glenohumeral and mild acromioclavicular degenerative changes. The subacromial space appears mildly narrowed. IMPRESSION: 1. No evidence of acute fracture or dislocation in either shoulder. 2. Old healed fracture of the proximal left humerus. 3. Chronic compression deformities at T11, L4 and L5. No acute osseous findings in the lumbar spine. Electronically Signed   By: Carey Bullocks M.D.   On: 07/20/2023 12:38   DG Shoulder Right  Result Date: 07/20/2023 CLINICAL DATA:  Fall during transfer from bed to wheelchair. Back and bilateral shoulder pain. EXAM: LEFT SHOULDER - 2+ VIEW; LUMBAR SPINE - COMPLETE 4+ VIEW; RIGHT SHOULDER - 2+ VIEW COMPARISON:  Lumbar spine radiographs 01/07/2023 FINDINGS: Lumbar spine: The bones are demineralized. There are 5 lumbar type vertebral bodies. The alignment is stable with a mild convex right scoliosis and a grade 1 anterolisthesis at L4-5. Chronic compression deformities at T11, L4 and L5 are grossly stable. No evidence of acute fracture. Multilevel  spondylosis with endplate osteophytes and facet hypertrophy. Aortoiliac atherosclerosis and previous right hip arthroplasty noted. Right shoulder: Osseous demineralization. No evidence of acute fracture or dislocation. There are moderate to advanced glenohumeral degenerative changes with joint space narrowing and osteophytes. Mild acromioclavicular degenerative changes. The subacromial space is mildly narrowed. Left shoulder: Osseous demineralization. No evidence of acute fracture or dislocation. There is posttraumatic deformity of the proximal humerus consistent with an old healed fracture. Advanced glenohumeral and mild acromioclavicular degenerative changes. The subacromial space appears mildly narrowed. IMPRESSION: 1. No evidence of acute fracture or dislocation in either shoulder. 2. Old healed fracture of the proximal left humerus. 3. Chronic compression deformities at T11, L4 and L5. No acute osseous findings in the lumbar spine. Electronically Signed   By: Carey Bullocks M.D.   On: 07/20/2023 12:38   DG Lumbar Spine Complete  Result Date: 07/20/2023 CLINICAL DATA:  Fall during transfer from bed to wheelchair. Back and bilateral shoulder pain. EXAM: LEFT SHOULDER - 2+ VIEW; LUMBAR SPINE - COMPLETE 4+ VIEW; RIGHT SHOULDER - 2+ VIEW COMPARISON:  Lumbar spine radiographs 01/07/2023 FINDINGS: Lumbar spine: The bones are demineralized. There are 5 lumbar type vertebral bodies. The alignment is stable with a mild convex right scoliosis and a grade 1 anterolisthesis at L4-5. Chronic compression deformities at T11, L4 and L5 are grossly stable. No evidence of acute fracture. Multilevel spondylosis with endplate osteophytes and facet hypertrophy. Aortoiliac atherosclerosis and previous right hip arthroplasty noted. Right shoulder: Osseous demineralization. No evidence of acute fracture or dislocation. There are moderate to advanced glenohumeral degenerative changes with joint space narrowing and osteophytes.  Mild acromioclavicular degenerative changes. The subacromial space is mildly narrowed. Left shoulder: Osseous demineralization. No evidence of acute fracture or dislocation. There is posttraumatic deformity of the proximal humerus consistent with an old healed fracture. Advanced glenohumeral and mild acromioclavicular degenerative changes. The subacromial space appears mildly narrowed. IMPRESSION: 1. No evidence of acute fracture or dislocation in either shoulder. 2. Old healed fracture of the proximal left humerus. 3. Chronic compression deformities at T11, L4 and L5. No acute osseous findings in the lumbar spine. Electronically Signed   By: Carey Bullocks M.D.   On: 07/20/2023 12:38   CT Head Wo Contrast  Result Date: 07/19/2023 CLINICAL DATA:  Head trauma, minor (Age >= 65y) Fall on thinners; Neck trauma (Age >= 65y) EXAM: CT HEAD WITHOUT CONTRAST CT CERVICAL SPINE WITHOUT CONTRAST TECHNIQUE: Multidetector CT imaging of the head and cervical spine was performed following the standard protocol without intravenous contrast. Multiplanar CT image reconstructions of the  cervical spine were also generated. RADIATION DOSE REDUCTION: This exam was performed according to the departmental dose-optimization program which includes automated exposure control, adjustment of the mA and/or kV according to patient size and/or use of iterative reconstruction technique. COMPARISON:  CT head and CT cervical spine May 29, 2023. FINDINGS: CT HEAD FINDINGS Brain: Remote left cerebellar infarct. Cerebral atrophy. No evidence of acute large vascular territory infarct, acute hemorrhage, mass lesion, midline shift or hydrocephalus. Vascular: Calcific atherosclerosis. Skull: No acute fracture. Sinuses/Orbits: Mostly clear sinuses.  No acute orbital findings. Other: No mastoid effusions. CT CERVICAL SPINE FINDINGS Alignment: Similar degenerative anterolisthesis of C4 on C5 and C7 on T1. scoliosis. Skull base and vertebrae: No  evidence of acute fracture. Vertebral body heights are maintained. Soft tissues and spinal canal: No prevertebral fluid or swelling. No visible canal hematoma. Disc levels: Similar multilevel degenerative change including severe degenerative disease at C7-T1 and bridging anterior osteophytes in the mid upper cervical spine. Multilevel facet and uncovertebral hypertrophy with varying degrees of neural foraminal stenosis. Upper chest: Visualized lung apices are clear. IMPRESSION: No evidence of acute abnormality intracranially or in the cervical spine. Electronically Signed   By: Feliberto Harts M.D.   On: 07/19/2023 15:53   CT Cervical Spine Wo Contrast  Result Date: 07/19/2023 CLINICAL DATA:  Head trauma, minor (Age >= 65y) Fall on thinners; Neck trauma (Age >= 65y) EXAM: CT HEAD WITHOUT CONTRAST CT CERVICAL SPINE WITHOUT CONTRAST TECHNIQUE: Multidetector CT imaging of the head and cervical spine was performed following the standard protocol without intravenous contrast. Multiplanar CT image reconstructions of the cervical spine were also generated. RADIATION DOSE REDUCTION: This exam was performed according to the departmental dose-optimization program which includes automated exposure control, adjustment of the mA and/or kV according to patient size and/or use of iterative reconstruction technique. COMPARISON:  CT head and CT cervical spine May 29, 2023. FINDINGS: CT HEAD FINDINGS Brain: Remote left cerebellar infarct. Cerebral atrophy. No evidence of acute large vascular territory infarct, acute hemorrhage, mass lesion, midline shift or hydrocephalus. Vascular: Calcific atherosclerosis. Skull: No acute fracture. Sinuses/Orbits: Mostly clear sinuses.  No acute orbital findings. Other: No mastoid effusions. CT CERVICAL SPINE FINDINGS Alignment: Similar degenerative anterolisthesis of C4 on C5 and C7 on T1. scoliosis. Skull base and vertebrae: No evidence of acute fracture. Vertebral body heights are  maintained. Soft tissues and spinal canal: No prevertebral fluid or swelling. No visible canal hematoma. Disc levels: Similar multilevel degenerative change including severe degenerative disease at C7-T1 and bridging anterior osteophytes in the mid upper cervical spine. Multilevel facet and uncovertebral hypertrophy with varying degrees of neural foraminal stenosis. Upper chest: Visualized lung apices are clear. IMPRESSION: No evidence of acute abnormality intracranially or in the cervical spine. Electronically Signed   By: Feliberto Harts M.D.   On: 07/19/2023 15:53    Procedures Procedures    Medications Ordered in ED Medications - No data to display  ED Course/ Medical Decision Making/ A&P                                 Medical Decision Making Amount and/or Complexity of Data Reviewed Radiology: ordered.   This patient presents to the ED for concern of fall, this involves an extensive number of treatment options, and is a complaint that carries with it a high risk of complications and morbidity.  The differential diagnosis includes CVA, fracture, strain/pain, dislocation, ligamentous/tendinous injury, neurovascular compromise, solid  organ damage, pneumothorax, other   Co morbidities that complicate the patient evaluation  See HPI   Additional history obtained:  Additional history obtained from EMR External records from outside source obtained and reviewed including hospital records   Lab Tests:  N/a   Imaging Studies ordered:  I ordered imaging studies including left shoulder x-ray, right shoulder x-ray, lumbar x-ray I independently visualized and interpreted imaging which showed  Bilateral shoulder x-ray, lumbar x-ray: No evidence of acute fracture/dislocation of the shoulder.  Chronic compression deformity of T11, 4 and L5.  No acute osseous findings of lumbar spine. I agree with the radiologist interpretation  Cardiac Monitoring: / EKG:  The patient was  maintained on a cardiac monitor.  I personally viewed and interpreted the cardiac monitored which showed an underlying rhythm of: Irregular rhythm with normal rate   Consultations Obtained:  N/a   Problem List / ED Course / Critical interventions / Medication management  Fall Reevaluation of the patient showed that the patient stayed the same I have reviewed the patients home medicines and have made adjustments as needed   Social Determinants of Health:  Former cigarette use.  Denies illicit drug use.   Test / Admission - Considered:  Fall Vitals signs significant for hypertension blood pressure 145/96. Otherwise within normal range and stable throughout visit. imaging studies significant for: See above 72 year old male presents emergency department after mechanical fall.  Fall occurred as he was trying to get to his wheelchair from his bed and slid onto the ground from the edge of the bed.  Patient with initial complaints of bilateral shoulder pain as well as low back pain.  Patient with history of chronic bilateral shoulder pain as well as low back pain that "may be slightly worse than normal."Patient with ability to fully range bilateral shoulders well tenderness bilateral proximal humerus.  X-rays negative.  Patient with some midline tenderness lower lumbar spine but with negative x-ray.  No red flag signs for back pain on HPI/PE concerning for cauda equina, spinal epidural abscess, other spinal cord impingement.  Discussion was had with patient regarding potential placement given recurrent falls and living at home with his >64 year old father but patient adamantly declined.  Patient reassured by workup.  Will recommend follow-up with primary care for reassessment.  Treatment plan discussed length with patient and he acknowledged understand was agreeable to said plan.  Patient overall well-appearing, afebrile in no acute distress. Worrisome signs and symptoms were discussed with the  patient, and the patient acknowledged understanding to return to the ED if noticed. Patient was stable upon discharge.          Final Clinical Impression(s) / ED Diagnoses Final diagnoses:  Fall, initial encounter    Rx / DC Orders ED Discharge Orders     None         Peter Garter, [place] 07/20/23 1510    Ernie Avena, MD 07/20/23 1515

## 2023-07-20 NOTE — ED Notes (Signed)
Pt has no complaints of new pain or injury.

## 2023-07-20 NOTE — ED Triage Notes (Signed)
Pt bib by [facility] c/o "hurting all over" following a fall while transferring from his bed to his wheelchair. Pt describes as "slid off the edge of the bed." Denies any new pain/injury, states "I'm not a young man anymore". No LOC, takes eliquis, CAOx4, NAD. Pt transferred himself from EMS stretcher to ED bed.

## 2023-07-21 ENCOUNTER — Emergency Department (HOSPITAL_BASED_OUTPATIENT_CLINIC_OR_DEPARTMENT_OTHER): Payer: Medicare Other

## 2023-07-21 ENCOUNTER — Emergency Department (HOSPITAL_BASED_OUTPATIENT_CLINIC_OR_DEPARTMENT_OTHER)
Admission: EM | Admit: 2023-07-21 | Discharge: 2023-07-21 | Disposition: A | Discharge status: Home / Self Care | Payer: Medicare Other | Attending: Emergency Medicine | Admitting: Emergency Medicine

## 2023-07-21 ENCOUNTER — Other Ambulatory Visit: Payer: Self-pay

## 2023-07-21 DIAGNOSIS — W19XXXA Unspecified fall, initial encounter: Secondary | ICD-10-CM

## 2023-07-21 DIAGNOSIS — M25562 Pain in left knee: Secondary | ICD-10-CM | POA: Insufficient documentation

## 2023-07-21 DIAGNOSIS — W1839XA Other fall on same level, initial encounter: Secondary | ICD-10-CM | POA: Insufficient documentation

## 2023-07-21 DIAGNOSIS — J45909 Unspecified asthma, uncomplicated: Secondary | ICD-10-CM | POA: Diagnosis not present

## 2023-07-21 DIAGNOSIS — Z79899 Other long term (current) drug therapy: Secondary | ICD-10-CM | POA: Diagnosis not present

## 2023-07-21 DIAGNOSIS — I10 Essential (primary) hypertension: Secondary | ICD-10-CM | POA: Diagnosis not present

## 2023-07-21 DIAGNOSIS — Z7901 Long term (current) use of anticoagulants: Secondary | ICD-10-CM | POA: Diagnosis not present

## 2023-07-21 MED ORDER — ACETAMINOPHEN 325 MG PO TABS: 650.0000 mg | ORAL_TABLET | Freq: Once | ORAL | Status: DC

## 2023-07-21 NOTE — Discharge Instructions (Signed)
As discussed, x-ray negative for fracture or dislocation.  Recommend continue use of Tylenol at home for treatment of pain.  You may ice knee.  Please do not hesitate to return if the worrisome signs and symptoms we discussed become apparent.

## 2023-07-21 NOTE — ED Notes (Signed)
Called [facility], Will send transport soon.

## 2023-07-21 NOTE — ED Triage Notes (Signed)
Pt reports fall while going to bathroom and states his "whole body hurts" now.  -LOC, -head injury,  no blood thinners.

## 2023-07-21 NOTE — ED Triage Notes (Signed)
Pt arrived via [facility] from home c/o L knee pain after sliding out of his wheelchair which has been recurring. Pt denies hitting his head, LOC, or any additional complaints. Pt was seen yesterday with c/o back pain after sliding out of his wheelchair.   EMS VS BP 126/84 HR 80 96% RA   20g L AC

## 2023-07-21 NOTE — ED Provider Notes (Signed)
[facility]ERGENC[facility] DEPARTMENT AT [facility] Provider Note   CSN: [id number] Arrival date & time: 07/21/23  1146     History  Chief Complaint  Patient presents with   Biran Ochocki is a 72 y.o. male.   Fall  72 year old male presents emergency department after a fall.  Patient states that he was transferring from his bed to his wheelchair and slid down to the ground.  States that he landed on the front of his left knee.  Denies trauma elsewhere.  Denies trauma to head, loss consciousness.  Patient is on Eliquis secondary to atrial fibrillation.  Denies any chest pain, shortness of breath, abdominal pain, nausea, vomiting.   Past medical history significant for atrial fibrillation on Eliquis, alcohol abuse, asthma, hypertension, BPH   Home Medications Prior to Admission medications   Medication Sig Start Date End Date Taking? Authorizing Provider  apixaban (ELIQUIS) 5 MG TABS tablet Take 1 tablet (5 mg total) by mouth 2 (two) times daily. 05/12/23   Little Ishikawa, MD  DHA-EPA-Flaxseed Oil-Vitamin E [facility] NUTRITION) CAPS Take 1 capsule by mouth daily.    [provider]  folic acid (FOLVITE) 1 MG tablet Take 1 tablet (1 mg total) by mouth daily. 07/01/23 07/31/23  Willeen Niece, MD  ibuprofen (ADVIL) 600 MG tablet Take 600 mg by mouth every 6 (six) hours as needed for moderate pain (pain score 4-6).    [provider]  metoprolol tartrate (LOPRESSOR) 25 MG tablet Take 0.5 tablets (12.5 mg total) by mouth 2 (two) times daily. 06/30/23 08/29/23  Willeen Niece, MD  Multiple Vitamins-Minerals (LIVER DETOX PO) Take 2 tablets by mouth daily.    [provider]  NON FORMULARY Take 2 capsules by mouth See admin instructions. Beet extract capsules- Take 2 capsules by mouth once a day    [provider]  tamsulosin (FLOMAX) 0.4 MG CAPS capsule Take 0.4 mg by mouth daily.    [provider]  thiamine (VITAMIN B-1)  100 MG tablet Take 1 tablet (100 mg total) by mouth daily. 07/01/23 07/31/23  Willeen Niece, MD      Allergies    Patient has no known allergies.    Review of Systems   Review of Systems  All other systems reviewed and are negative.   Physical Exam Updated Vital Signs BP 125/70 (BP Location: Right Arm)   Pulse 75   Temp 98.4 F (36.9 C) (Oral)   Resp 16   SpO2 94%  Physical Exam Vitals and nursing note reviewed.  Constitutional:      General: He is not in acute distress.    Appearance: He is well-developed.  HENT:     Head: Normocephalic and atraumatic.  Eyes:     Conjunctiva/sclera: Conjunctivae normal.  Cardiovascular:     Rate and Rhythm: Normal rate and regular rhythm.  Pulmonary:     Effort: Pulmonary effort is normal. No respiratory distress.     Breath sounds: Normal breath sounds.  Abdominal:     Palpations: Abdomen is soft.     Tenderness: There is no abdominal tenderness.  Musculoskeletal:        General: No swelling.     Cervical back: Neck supple.     Comments: No midline tenderness of cervical, thoracic, thoracic spine.   No chest wall tenderness.   Full range of motion bilateral shoulders, elbows, wrist, digits without overlying tenderness or appreciable traumatic injury.  Muscular strength symmetric bilateral extremities ankle  dorsi/plantarflexion, knee flexion/extension, hip flexion/extension.  Patient full range of motion bilateral hips, knees, ankles, digits.  Medial joint line tenderness to palpation of left knee.  No obvious overlying erythema, palpable fluctuance/induration.  Patient elicits pain when flexing left knee.  No sensory deficits along major nerve distributions of lower extremities.  Pedal pulses 2+ bilaterally.  Radial pulses 2+ bilaterally.   Skin:    General: Skin is warm and dry.     Capillary Refill: Capillary refill takes less than 2 seconds.  Neurological:     Mental Status: He is alert.     Comments: Alert and oriented to self,  place, time and event.    Speech is fluent, clear without dysarthria or dysphasia.    Strength symmetric in upper/lower extremities   Sensation intact in upper/lower extremities    Gait not assessed CN I not tested  CN II not tested CN III, IV, VI PERRLA and EOMs intact left  CN V Intact sensation to sharp and light touch to the left face.  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 CN VII facial movements symmetric  CN VIII not tested  CN IX, X no uvula deviation, symmetric rise of soft palate  CN XI 5/5 SCM and trapezius strength bilaterally  CN XII Midline tongue protrusion, symmetric L/R movements    Psychiatric:        Mood and Affect: Mood normal.     ED Results / Procedures / Treatments   Labs (all labs ordered are listed, but only abnormal results are displayed) Labs Reviewed - No data to display  EKG None  Radiology DG Knee Complete 4 Views Left  Result Date: 07/21/2023 CLINICAL DATA:  Knee pain after sliding out of his wheelchair. EXAM: LEFT KNEE - COMPLETE 4+ VIEW COMPARISON:  11/10/2022. FINDINGS: No fracture or bone lesion. Knee prosthetic components appear well seated and aligned and unchanged. No convincing joint effusion. Skeletal structures are demineralized. Posterior vascular calcifications. Soft tissues otherwise unremarkable. IMPRESSION: 1. No fracture or acute finding. 2. Knee prosthetic components appear well seated and aligned. Electronically Signed   By: Amie [place] M.D.   On: 07/21/2023 12:22   DG Shoulder Left  Result Date: 07/20/2023 CLINICAL DATA:  Fall during transfer from bed to wheelchair. Back and bilateral shoulder pain. EXAM: LEFT SHOULDER - 2+ VIEW; LUMBAR SPINE - COMPLETE 4+ VIEW; RIGHT SHOULDER - 2+ VIEW COMPARISON:  Lumbar spine radiographs 01/07/2023 FINDINGS: Lumbar spine: The bones are demineralized. There are 5  lumbar type vertebral bodies. The alignment is stable with a mild convex right scoliosis and a grade 1 anterolisthesis at L4-5. Chronic compression deformities at T11, L4 and L5 are grossly stable. No evidence of acute fracture. Multilevel spondylosis with endplate osteophytes and facet hypertrophy. Aortoiliac atherosclerosis and previous right hip arthroplasty noted. Right shoulder: Osseous demineralization. No evidence of acute fracture or dislocation. There are moderate to advanced glenohumeral degenerative changes with joint space narrowing and osteophytes. Mild acromioclavicular degenerative changes. The subacromial space is mildly narrowed. Left shoulder: Osseous demineralization. No evidence of acute fracture or dislocation. There is posttraumatic deformity of the proximal humerus consistent with an old healed fracture. Advanced glenohumeral and mild acromioclavicular degenerative changes. The subacromial space appears mildly narrowed. IMPRESSION: 1. No evidence of acute fracture or dislocation in either shoulder. 2. Old healed fracture of the proximal left humerus. 3. Chronic compression deformities at T11, L4 and  L5. No acute osseous findings in the lumbar spine. Electronically Signed   By: Carey Bullocks M.D.   On: 07/20/2023 12:38   DG Shoulder Right  Result Date: 07/20/2023 CLINICAL DATA:  Fall during transfer from bed to wheelchair. Back and bilateral shoulder pain. EXAM: LEFT SHOULDER - 2+ VIEW; LUMBAR SPINE - COMPLETE 4+ VIEW; RIGHT SHOULDER - 2+ VIEW COMPARISON:  Lumbar spine radiographs 01/07/2023 FINDINGS: Lumbar spine: The bones are demineralized. There are 5 lumbar type vertebral bodies. The alignment is stable with a mild convex right scoliosis and a grade 1 anterolisthesis at L4-5. Chronic compression deformities at T11, L4 and L5 are grossly stable. No evidence of acute fracture. Multilevel spondylosis with endplate osteophytes and facet hypertrophy. Aortoiliac atherosclerosis and  previous right hip arthroplasty noted. Right shoulder: Osseous demineralization. No evidence of acute fracture or dislocation. There are moderate to advanced glenohumeral degenerative changes with joint space narrowing and osteophytes. Mild acromioclavicular degenerative changes. The subacromial space is mildly narrowed. Left shoulder: Osseous demineralization. No evidence of acute fracture or dislocation. There is posttraumatic deformity of the proximal humerus consistent with an old healed fracture. Advanced glenohumeral and mild acromioclavicular degenerative changes. The subacromial space appears mildly narrowed. IMPRESSION: 1. No evidence of acute fracture or dislocation in either shoulder. 2. Old healed fracture of the proximal left humerus. 3. Chronic compression deformities at T11, L4 and L5. No acute osseous findings in the lumbar spine. Electronically Signed   By: Carey Bullocks M.D.   On: 07/20/2023 12:38   DG Lumbar Spine Complete  Result Date: 07/20/2023 CLINICAL DATA:  Fall during transfer from bed to wheelchair. Back and bilateral shoulder pain. EXAM: LEFT SHOULDER - 2+ VIEW; LUMBAR SPINE - COMPLETE 4+ VIEW; RIGHT SHOULDER - 2+ VIEW COMPARISON:  Lumbar spine radiographs 01/07/2023 FINDINGS: Lumbar spine: The bones are demineralized. There are 5 lumbar type vertebral bodies. The alignment is stable with a mild convex right scoliosis and a grade 1 anterolisthesis at L4-5. Chronic compression deformities at T11, L4 and L5 are grossly stable. No evidence of acute fracture. Multilevel spondylosis with endplate osteophytes and facet hypertrophy. Aortoiliac atherosclerosis and previous right hip arthroplasty noted. Right shoulder: Osseous demineralization. No evidence of acute fracture or dislocation. There are moderate to advanced glenohumeral degenerative changes with joint space narrowing and osteophytes. Mild acromioclavicular degenerative changes. The subacromial space is mildly narrowed. Left  shoulder: Osseous demineralization. No evidence of acute fracture or dislocation. There is posttraumatic deformity of the proximal humerus consistent with an old healed fracture. Advanced glenohumeral and mild acromioclavicular degenerative changes. The subacromial space appears mildly narrowed. IMPRESSION: 1. No evidence of acute fracture or dislocation in either shoulder. 2. Old healed fracture of the proximal left humerus. 3. Chronic compression deformities at T11, L4 and L5. No acute osseous findings in the lumbar spine. Electronically Signed   By: Carey Bullocks M.D.   On: 07/20/2023 12:38   CT Head Wo Contrast  Result Date: 07/19/2023 CLINICAL DATA:  Head trauma, minor (Age >= 65y) Fall on thinners; Neck trauma (Age >= 65y) EXAM: CT HEAD WITHOUT CONTRAST CT CERVICAL SPINE WITHOUT CONTRAST TECHNIQUE: Multidetector CT imaging of the head and cervical spine was performed following the standard protocol without intravenous contrast. Multiplanar CT image reconstructions of the cervical spine were also generated. RADIATION DOSE REDUCTION: This exam was performed according to the departmental dose-optimization program which includes automated exposure control, adjustment of the mA and/or kV according to patient size and/or use of iterative reconstruction technique. COMPARISON:  CT  head and CT cervical spine May 29, 2023. FINDINGS: CT HEAD FINDINGS Brain: Remote left cerebellar infarct. Cerebral atrophy. No evidence of acute large vascular territory infarct, acute hemorrhage, mass lesion, midline shift or hydrocephalus. Vascular: Calcific atherosclerosis. Skull: No acute fracture. Sinuses/Orbits: Mostly clear sinuses.  No acute orbital findings. Other: No mastoid effusions. CT CERVICAL SPINE FINDINGS Alignment: Similar degenerative anterolisthesis of C4 on C5 and C7 on T1. scoliosis. Skull base and vertebrae: No evidence of acute fracture. Vertebral body heights are maintained. Soft tissues and spinal canal:  No prevertebral fluid or swelling. No visible canal hematoma. Disc levels: Similar multilevel degenerative change including severe degenerative disease at C7-T1 and bridging anterior osteophytes in the mid upper cervical spine. Multilevel facet and uncovertebral hypertrophy with varying degrees of neural foraminal stenosis. Upper chest: Visualized lung apices are clear. IMPRESSION: No evidence of acute abnormality intracranially or in the cervical spine. Electronically Signed   By: Feliberto Harts M.D.   On: 07/19/2023 15:53   CT Cervical Spine Wo Contrast  Result Date: 07/19/2023 CLINICAL DATA:  Head trauma, minor (Age >= 65y) Fall on thinners; Neck trauma (Age >= 65y) EXAM: CT HEAD WITHOUT CONTRAST CT CERVICAL SPINE WITHOUT CONTRAST TECHNIQUE: Multidetector CT imaging of the head and cervical spine was performed following the standard protocol without intravenous contrast. Multiplanar CT image reconstructions of the cervical spine were also generated. RADIATION DOSE REDUCTION: This exam was performed according to the departmental dose-optimization program which includes automated exposure control, adjustment of the mA and/or kV according to patient size and/or use of iterative reconstruction technique. COMPARISON:  CT head and CT cervical spine May 29, 2023. FINDINGS: CT HEAD FINDINGS Brain: Remote left cerebellar infarct. Cerebral atrophy. No evidence of acute large vascular territory infarct, acute hemorrhage, mass lesion, midline shift or hydrocephalus. Vascular: Calcific atherosclerosis. Skull: No acute fracture. Sinuses/Orbits: Mostly clear sinuses.  No acute orbital findings. Other: No mastoid effusions. CT CERVICAL SPINE FINDINGS Alignment: Similar degenerative anterolisthesis of C4 on C5 and C7 on T1. scoliosis. Skull base and vertebrae: No evidence of acute fracture. Vertebral body heights are maintained. Soft tissues and spinal canal: No prevertebral fluid or swelling. No visible canal  hematoma. Disc levels: Similar multilevel degenerative change including severe degenerative disease at C7-T1 and bridging anterior osteophytes in the mid upper cervical spine. Multilevel facet and uncovertebral hypertrophy with varying degrees of neural foraminal stenosis. Upper chest: Visualized lung apices are clear. IMPRESSION: No evidence of acute abnormality intracranially or in the cervical spine. Electronically Signed   By: Feliberto Harts M.D.   On: 07/19/2023 15:53    Procedures Procedures    Medications Ordered in ED Medications  acetaminophen (TYLENOL) tablet 650 mg (650 mg Oral Patient Refused/Not Given 07/21/23 1252)    ED Course/ Medical Decision Making/ A&P                                 Medical Decision Making Amount and/or Complexity of Data Reviewed Radiology: ordered.  Risk OTC drugs.   This patient presents to the ED for concern of fall, this involves an extensive number of treatment options, and is a complaint that carries with it a high risk of complications and morbidity.  The differential diagnosis includes CVA, fracture, strain/pain, dislocation, ligamentous/tendinous injury, neurovascular compromise, solid organ damage, pneumothorax, other     Co morbidities that complicate the patient evaluation   See HPI     Additional history obtained:  Additional history obtained from EMR External records from outside source obtained and reviewed including hospital records     Lab Tests:   N/a     Imaging Studies ordered:   I ordered imaging studies including left knee x-ray I independently visualized and interpreted imaging which showed  Left knee x-ray: Prior total knee arthroplasty.  No acute abnormality. I agree with the radiologist interpretation   Cardiac Monitoring: / EKG:   The patient was maintained on a cardiac monitor.  I personally viewed and interpreted the cardiac monitored which showed an underlying rhythm of: Irregular rhythm with  normal rate     Consultations Obtained:   N/a     Problem List / ED Course / Critical interventions / Medication management   Fall Reevaluation of the patient showed that the patient stayed the same I have reviewed the patients home medicines and have made adjustments as needed     Social Determinants of Health:   Former cigarette use.  Denies illicit drug use.     Test / Admission - Considered:   Fall, left knee pain Vitals signs significant for hypertension blood pressure 145/96. Otherwise within normal range and stable throughout visit. imaging studies significant for: See above 72 year old male presents emergency department after mechanical fall.  Fall occurred as he was trying to get to his wheelchair from his bed and slid onto the ground from the edge of the bed landing directly on his left knee.  No trauma to head, LOC or trauma elsewhere.  On exam, patient with tenderness medial aspect of left knee but otherwise, no appreciable tenderness or obvious traumatic injury on full body exam.  Patient with nonfocal neurologic exam and without trauma to head to CT imaging was not obtained.  X-ray imaging was obtained of patient's left knee which was negative for any acute osseous abnormality.  No pulse deficits to suggest ischemic limb.  No overlying skin changes concerning for secondary infectious process.  Patient reassured by workup.  Patient has been seen now 3 times in the past 3 days for similar types of fall where he slid out of his wheelchair.  Again, discussed with patient regarding desire for further assistance at home or placement at facility but he declined.  States he has home health come out 2-3 times a week and "that is enough."  Further workup deemed necessary at this time.  Recommend close follow-up with primary care in the outpatient setting for reassessment.  Treatment plan discussed at length with patient and he acknowledged understanding was agreeable to said plan.   Patient overall well-appearing, afebrile in no acute distress. Worrisome signs and symptoms were discussed with the patient, and the patient acknowledged understanding to return to the ED if noticed. Patient was stable upon discharge.         Final Clinical Impression(s) / ED Diagnoses Final diagnoses:  Fall, initial encounter  Acute pain of left knee    Rx / DC Orders ED Discharge Orders     None         Peter Garter, [place] 07/21/23 1322    Rondel Baton, MD 07/21/23 [phone]

## 2023-07-21 NOTE — ED Notes (Addendum)
Pt given discharge instructions. Opportunities given for questions. Pt verbalizes understanding. Pt dc via [facility]. Jillyn Hidden, RN

## 2023-08-01 ENCOUNTER — Telehealth: Payer: Self-pay

## 2023-08-01 NOTE — Telephone Encounter (Signed)
 Transition Care Management Unsuccessful Follow-up Telephone Call  Date of discharge and from where:  [facility] 11/15  Attempts:  1st Attempt  Reason for unsuccessful TCM follow-up call:  No answer/busy   Lenard Forth [place]  [facility], [facility] Guide, Phone: [phone] Website: [email or web address]

## 2023-08-01 NOTE — Telephone Encounter (Signed)
 Transition Care Management Unsuccessful Follow-up Telephone Call  Date of discharge and from where:  [facility] 11/15  Attempts:  2nd Attempt  Reason for unsuccessful TCM follow-up call:  No answer/busy   Lenard Forth [place]  [facility], [facility] Guide, Phone: [phone] Website: [email or web address]

## 2023-08-02 ENCOUNTER — Ambulatory Visit: Payer: Self-pay

## 2023-08-02 IMAGING — CT CT MAXILLOFACIAL W/O CM
4 of 6 series · 14 of 47 positions shown, 16 images · non-contrast
Comparison: None.

CLINICAL DATA: 69-year-old male with head, neck and facial injury
from syncopal fall. Patient on blood thinners.

EXAM:
CT HEAD WITHOUT CONTRAST
CT MAXILLOFACIAL WITHOUT CONTRAST
CT CERVICAL SPINE WITHOUT CONTRAST
TECHNIQUE: Multidetector CT imaging of the head, cervical spine, and
maxillofacial structures were performed using the standard protocol
without intravenous contrast. Multiplanar CT image reconstructions
of the cervical spine and maxillofacial structures were also
generated.

[Series 3: facialbone 2.0 st · axial · 0.38mm/px · z∈[+1271,+1397]mm · 8 of 82 slices shown, 10 images]
[im 10/82  brain]
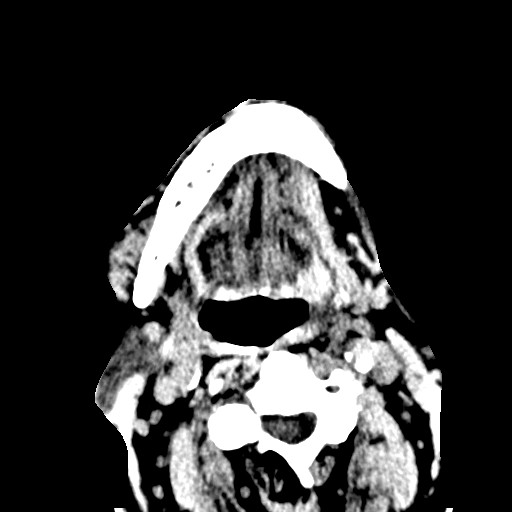
[im 10/82  bone]
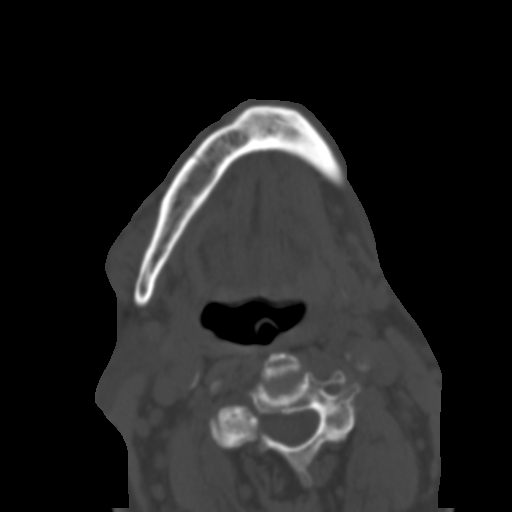
[im 19/82  bone]
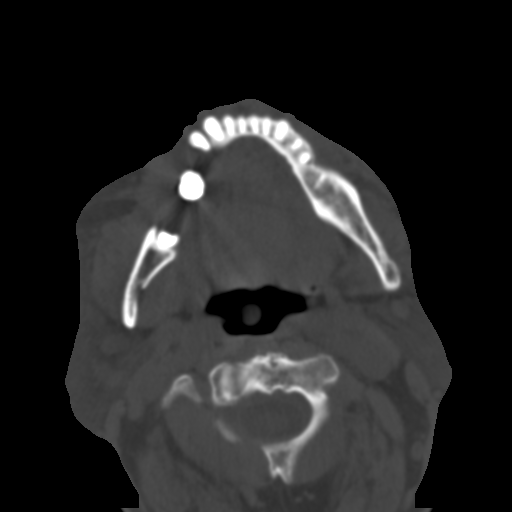
[im 28/82  bone]
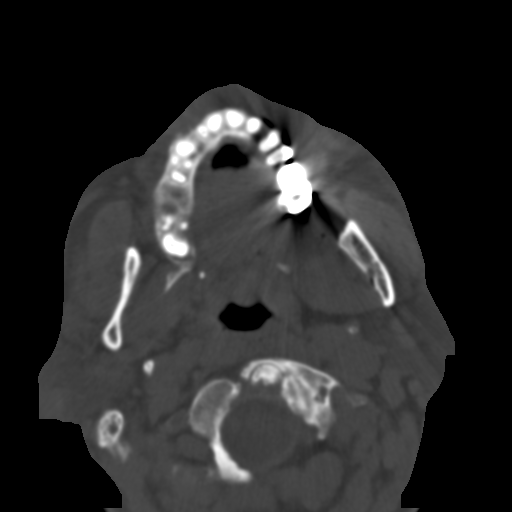
[im 37/82  bone]
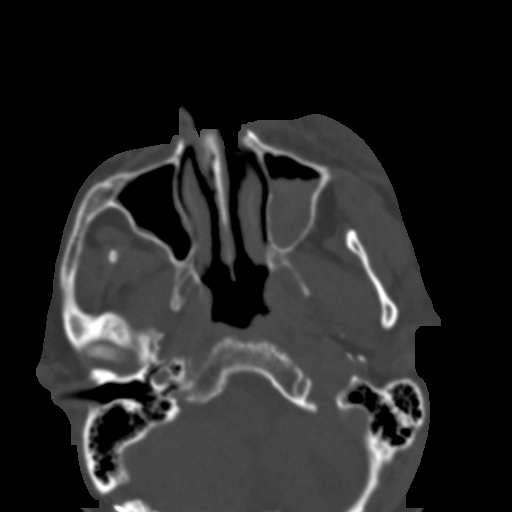
[im 46/82  brain]
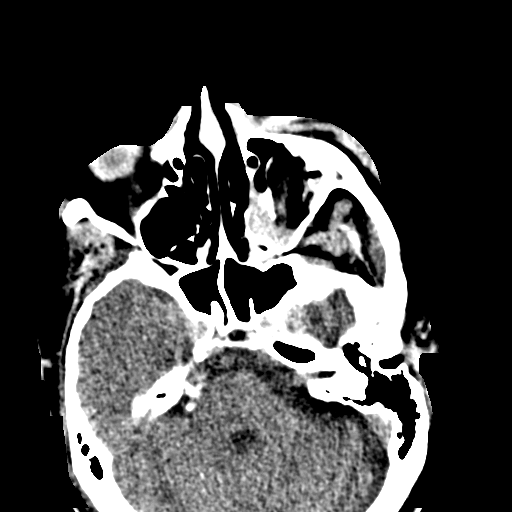
[im 46/82  bone]
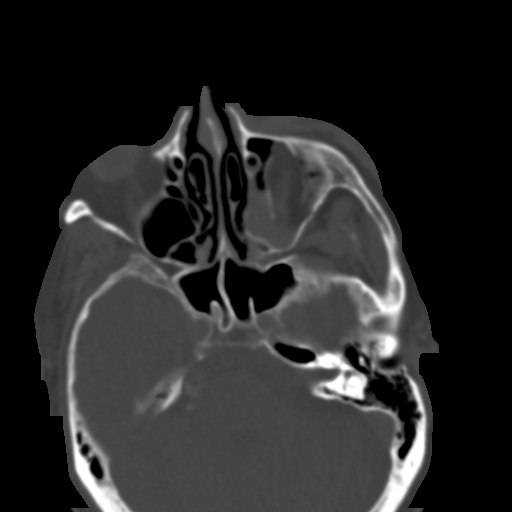
[im 55/82  bone]
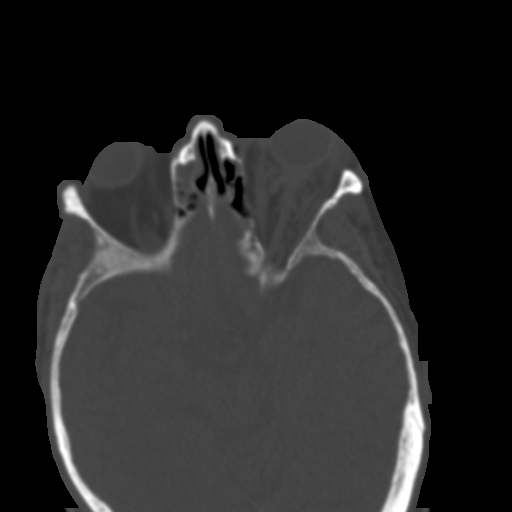
[im 64/82  bone]
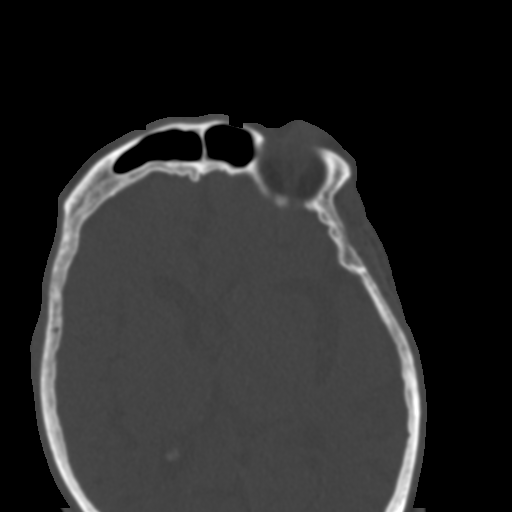
[im 73/82  bone]
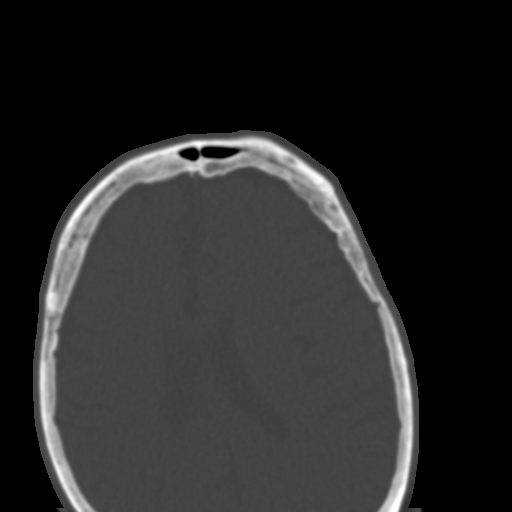

[Series 4: bone 2.0 ax · axial · 0.38mm/px · z∈[+1252,+1269]mm · 2 of 86 slices shown]
[im 10/86  bone]
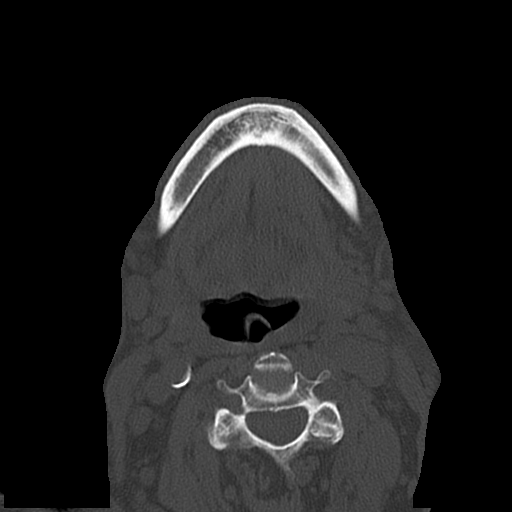
[im 19/86  bone]
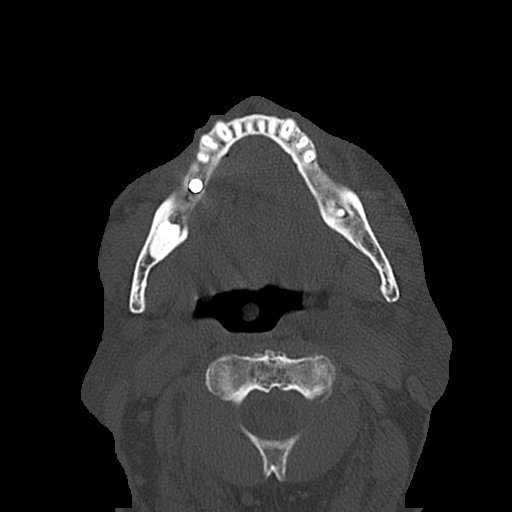

[Series 6: bone 2.0 sag · sagittal · 0.32mm/px · 1 of 84 slices shown]
[im 42/84  bone]
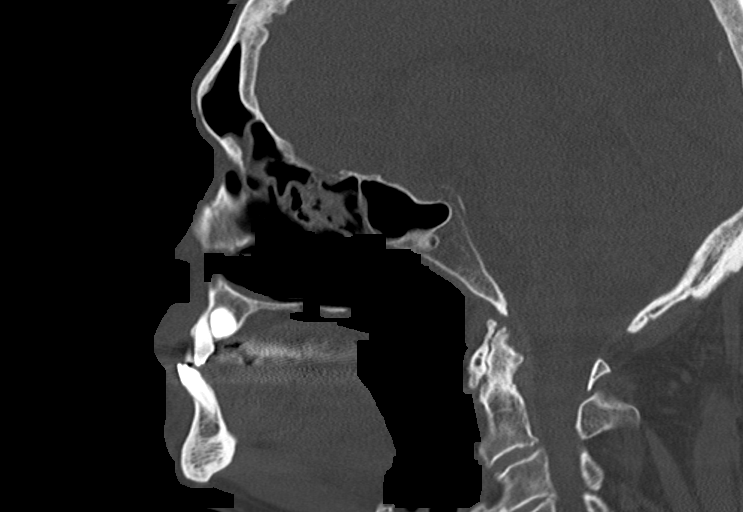

[Series 7: facialbone 2.0 cor st · coronal · 0.30mm/px · 3 of 96 slices shown]
[im 32/96  bone]
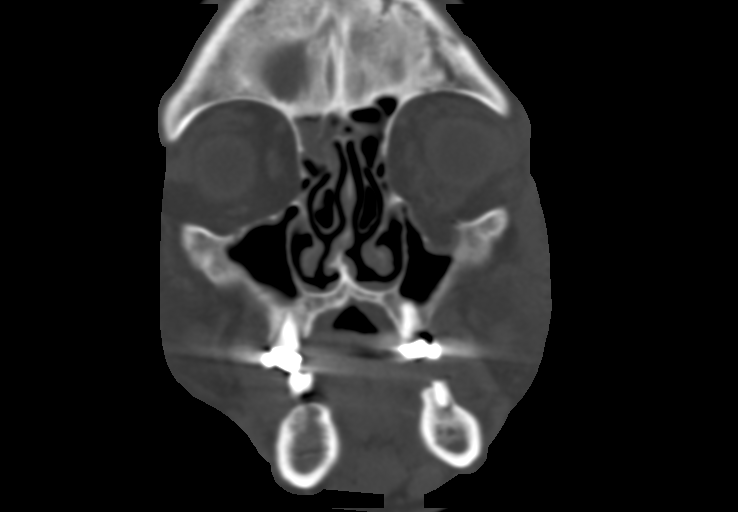
[im 43/96  bone]
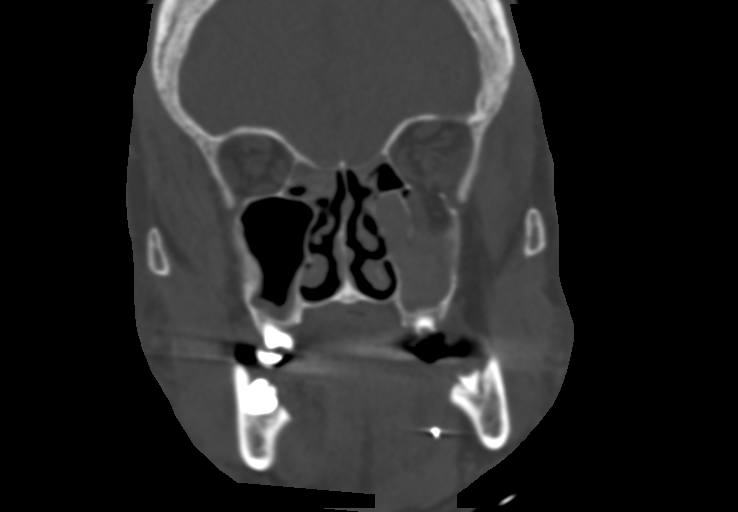
[im 53/96  bone]
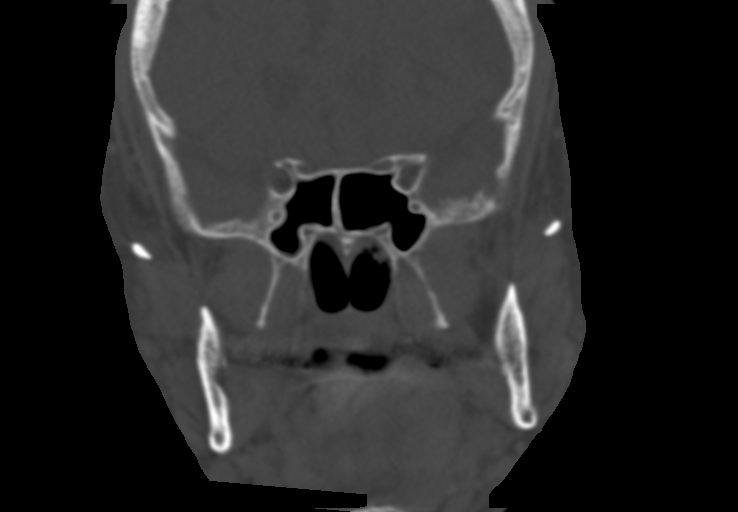

[14 of 47 positions shown; findings below may reference images not displayed]

FINDINGS: CT HEAD FINDINGS

Brain: No evidence of acute infarction, hemorrhage, hydrocephalus,
extra-axial collection or mass lesion/mass effect.

Calcifications in the region of the RIGHT CP angle is noted.

Atrophy, mild probable chronic small-vessel white matter ischemic
changes and remote LEFT cerebellar infarct noted.

Vascular: No hyperdense vessel or unexpected calcification.

Skull: No acute skull abnormality. RIGHT suboccipital craniotomy
changes are identified.

Other: LEFT facial soft tissue swelling is noted.

CT MAXILLOFACIAL FINDINGS

Osseous: A LEFT orbital floor fracture is noted with large fracture
fragment displaced up to 1 cm into the LEFT maxillary sinus. The
INFERIOR LEFT ocular musculature extends inferiorly through the wide
fracture site. LEFT Postseptal and intraconal inflammation is
identified with stranding along the LEFT optic nerve.

No other acute fractures are identified.

Multiple dental caries and periapical abscesses are noted.

Orbits: LEFT orbital abnormalities as discussed above. The globes
retain their spherical shape. The RIGHT orbit is unremarkable.

Sinuses: Blood within the LEFT maxillary sinus is noted. The
remainder of the paranasal sinuses, mastoid air cells and
middle/INNER ears are clear.

Soft tissues: LEFT facial soft tissue swelling is noted.

CT CERVICAL SPINE FINDINGS

Alignment: 2 mm anterolisthesis of C2 on C3, C4 on C5 and 4 mm
anterolisthesis of C7 on T1 are all likely degenerative.

Skull base and vertebrae: No acute fracture or suspicious bony
lesion.

Soft tissues and spinal canal: No prevertebral fluid or swelling. No
visible canal hematoma.

Disc levels: Moderate to severe multilevel degenerative disc
disease/spondylosis noted with mild to moderate multilevel facet
arthropathy. This contributes to some degree of central spinal and
foraminal narrowing at several levels.

Upper chest: A 2.5 cm ill-defined density in the LEFT
supraclavicular soft tissues, deep to the sternocleidomastoid likely
represents hemorrhage/hematoma.

Other: None
IMPRESSION: 1. LEFT orbital floor fracture with large fracture fragment
displaced up to 1 cm into the LEFT maxillary sinus. The INFERIOR
LEFT ocular musculature extends inferiorly through the wide fracture
site. LEFT postseptal and intraconal inflammation with stranding
along the LEFT optic nerve.
2. 2.5 cm ill-defined density in the LEFT supraclavicular soft
tissues, deep to the sternocleidomastoid, likely an area of
hemorrhage/hematoma.
3. No evidence of acute intracranial abnormality. Atrophy, mild
probable chronic small-vessel white matter ischemic changes and
remote LEFT cerebellar infarct. Calcification in the region of the
RIGHT CP angle, which could represent a calcified mass. Correlate
with any prior outside studies or symptoms referable to this area.
4. No evidence of acute injury to the cervical spine. Moderate to
severe multilevel degenerative changes.

## 2023-08-03 NOTE — Patient Instructions (Signed)
Visit Information  Thank you for taking time to visit with me today. Please don't hesitate to contact me if I can be of assistance to you.   Following are the goals we discussed today:   Goals Addressed             This Visit's Progress    To avoid having more falls   Not on track    Care Coordination Interventions: Determined patient has experienced multiple falls resulting in ED visits since last nurse follow up  Discussed with patient the reason for frequent falls, patient denies having actual falls, he advises passing a kidney stone and having other concerns resulting in ED visits, he denies having issues with transfers, patient denies having home safety concerns, his home is handicap assessable  Provided written and verbal education re: potential causes of falls and Fall prevention strategies Reviewed medications and discussed potential side effects of medications such as dizziness and frequent urination Advised patient of importance of notifying provider of falls Assessed for signs and symptoms of orthostatic hypotension Assessed for falls since last encounter Assessed patients knowledge of fall risk prevention secondary to previously provided education Assessed for home safety concerns, patient denies  Provided patient with the contact information for the [facility] Department via secure email Discussed with patient moving his nurse follow up calls to biweekly and patient is agreeable  Patient will continue to use fall precautions Patient will notify his PCP of any/all falls  Patient will seek medical attention if a fall occurs  Patient will resume working with PT for strengthening and endurance Patient will contact the [place] Admin Office for more information about VA benefits Patient will continue to work with nurse care coordinator for chronic disease management and care coordination needs           COMPLETED: To recover from recent cataract sugery        Care Coordination Interventions: Evaluation of current treatment plan related to Cataract extraction from right eye  and patient's adherence to plan as established by provider Determined patient completed a full recovery from recent Cataract surgery, reports his cornea abrasion has also healed Discussed patient feels his vision has improved, he is unable to wear contact lens due to having dexterity issues to his right hand, he will further discuss with his eye specialist  Reviewed and discussed patient will call Dr. Valere Dross to schedule his next follow up visit Patient will contact Dr. Lolita Rieger to schedule his follow up visit as directed Patient will report new symptoms or concerns to his doctor           Our next appointment is by telephone on 08/16/23 at 12:00 PM  Please call the care guide team at [phone] if you need to cancel or reschedule your appointment.   If you are experiencing a Mental Health or Behavioral Health Crisis or need someone to talk to, please call 1-800-273-TALK (toll free, 24 hour hotline)  Patient verbalizes understanding of instructions and care plan provided today and agrees to view in MyChart. Active MyChart status and patient understanding of how to access instructions and care plan via MyChart confirmed with patient.     Delsa Sale RN BSN CCM [place]  [facility], [facility] Nurse Care Coordinator  Direct Dial: [phone] Website: [email or web address]

## 2023-08-03 NOTE — Patient Outreach (Signed)
Care Coordination   Follow Up Visit Note   08/02/2023 Name: Taylor Myers MRN: [id number] DOB: 1951/07/03  Taylor Myers is a 72 y.o. year old male who sees Tisovec, Adelfa Koh, MD for primary care. I spoke with  Taylor Myers by phone today.  What matters to the patients health and wellness today?  Patient will use fall precautions to help avoid falls and or fall injuries.    Goals Addressed             This Visit's Progress    To avoid having more falls   Not on track    Care Coordination Interventions: Determined patient has experienced multiple falls resulting in ED visits since last nurse follow up  Discussed with patient the reason for frequent falls, patient denies having actual falls, he advises passing a kidney stone and having other concerns resulting in ED visits, he denies having issues with transfers, patient denies having home safety concerns, his home is handicap assessable  Provided written and verbal education re: potential causes of falls and Fall prevention strategies Reviewed medications and discussed potential side effects of medications such as dizziness and frequent urination Advised patient of importance of notifying provider of falls Assessed for signs and symptoms of orthostatic hypotension Assessed for falls since last encounter Assessed patients knowledge of fall risk prevention secondary to previously provided education Assessed for home safety concerns, patient denies  Provided patient with the contact information for the [facility] Service Department via secure email Discussed with patient moving his nurse follow up calls to biweekly and patient is agreeable  Patient will continue to use fall precautions Patient will notify his PCP of any/all falls  Patient will seek medical attention if a fall occurs  Patient will resume working with PT for strengthening and endurance Patient will contact the [place] Admin Office for more information about  VA benefits Patient will continue to work with nurse care coordinator for chronic disease management and care coordination needs           COMPLETED: To recover from recent cataract sugery       Care Coordination Interventions: Evaluation of current treatment plan related to Cataract extraction from right eye  and patient's adherence to plan as established by provider Determined patient completed a full recovery from recent Cataract surgery, reports his cornea abrasion has also healed Discussed patient feels his vision has improved, he is unable to wear contact lens due to having dexterity issues to his right hand, he will further discuss with his eye specialist  Reviewed and discussed patient will call Dr. Valere Dross to schedule his next follow up visit Patient will contact Dr. Lolita Rieger to schedule his follow up visit as directed Patient will report new symptoms or concerns to his doctor       Interventions Today    Flowsheet Row Most Recent Value  Chronic Disease   Chronic disease during today's visit Other  [s/p renal stone,  falls,  rash,  s/p cataract surgery]  General Interventions   General Interventions Discussed/Reviewed General Interventions Discussed, General Interventions Reviewed, Doctor Visits  Doctor Visits Discussed/Reviewed Doctor Visits Discussed, Doctor Visits Reviewed, PCP  Exercise Interventions   Exercise Discussed/Reviewed Exercise Discussed, Exercise Reviewed, Physical Activity  Physical Activity Discussed/Reviewed Types of exercise, Physical Activity Reviewed, Physical Activity Discussed  Education Interventions   Education Provided Provided Education  Pharmacy Interventions   Pharmacy Dicussed/Reviewed Pharmacy Topics Discussed, Pharmacy Topics Reviewed, Medications and their functions  Safety Interventions  Safety Discussed/Reviewed Fall Risk, Home Safety, Safety Reviewed, Safety Discussed  Home Safety Assistive Devices          SDOH  assessments and interventions completed:  Yes  SDOH Interventions Today    Flowsheet Row Most Recent Value  SDOH Interventions   Food Insecurity Interventions Intervention Not Indicated  Housing Interventions Intervention Not Indicated  Transportation Interventions Intervention Not Indicated  Utilities Interventions Intervention Not Indicated  Physical Activity Interventions Other (Comments)  [patient unable to exercise due to impaired physical mobility]        Care Coordination Interventions:  Yes, provided   Follow up plan: Follow up call scheduled for 08/16/23 @12 :00 PM    Encounter Outcome:  Patient Visit Completed

## 2023-08-09 ENCOUNTER — Emergency Department (HOSPITAL_COMMUNITY)
Admission: EM | Admit: 2023-08-09 | Discharge: 2023-08-10 | Disposition: A | Discharge status: Home / Self Care | Payer: Medicare Other | Attending: Emergency Medicine | Admitting: Emergency Medicine

## 2023-08-09 ENCOUNTER — Emergency Department (HOSPITAL_COMMUNITY): Payer: Medicare Other

## 2023-08-09 DIAGNOSIS — I10 Essential (primary) hypertension: Secondary | ICD-10-CM | POA: Insufficient documentation

## 2023-08-09 DIAGNOSIS — W19XXXA Unspecified fall, initial encounter: Secondary | ICD-10-CM

## 2023-08-09 DIAGNOSIS — I4891 Unspecified atrial fibrillation: Secondary | ICD-10-CM | POA: Diagnosis not present

## 2023-08-09 DIAGNOSIS — M4854XA Collapsed vertebra, not elsewhere classified, thoracic region, initial encounter for fracture: Secondary | ICD-10-CM | POA: Insufficient documentation

## 2023-08-09 DIAGNOSIS — I700 Atherosclerosis of aorta: Secondary | ICD-10-CM | POA: Insufficient documentation

## 2023-08-09 DIAGNOSIS — M48061 Spinal stenosis, lumbar region without neurogenic claudication: Secondary | ICD-10-CM | POA: Insufficient documentation

## 2023-08-09 DIAGNOSIS — R9089 Other abnormal findings on diagnostic imaging of central nervous system: Secondary | ICD-10-CM | POA: Insufficient documentation

## 2023-08-09 DIAGNOSIS — S39012A Strain of muscle, fascia and tendon of lower back, initial encounter: Secondary | ICD-10-CM

## 2023-08-09 DIAGNOSIS — M542 Cervicalgia: Secondary | ICD-10-CM | POA: Diagnosis present

## 2023-08-09 DIAGNOSIS — M25511 Pain in right shoulder: Secondary | ICD-10-CM | POA: Diagnosis not present

## 2023-08-09 DIAGNOSIS — Z7901 Long term (current) use of anticoagulants: Secondary | ICD-10-CM | POA: Diagnosis not present

## 2023-08-09 DIAGNOSIS — F10120 Alcohol abuse with intoxication, uncomplicated: Secondary | ICD-10-CM | POA: Insufficient documentation

## 2023-08-09 DIAGNOSIS — Y908 Blood alcohol level of 240 mg/100 ml or more: Secondary | ICD-10-CM | POA: Insufficient documentation

## 2023-08-09 DIAGNOSIS — F1010 Alcohol abuse, uncomplicated: Secondary | ICD-10-CM | POA: Insufficient documentation

## 2023-08-09 DIAGNOSIS — F10920 Alcohol use, unspecified with intoxication, uncomplicated: Secondary | ICD-10-CM

## 2023-08-09 DIAGNOSIS — S29011A Strain of muscle and tendon of front wall of thorax, initial encounter: Secondary | ICD-10-CM | POA: Insufficient documentation

## 2023-08-09 DIAGNOSIS — Z8673 Personal history of transient ischemic attack (TIA), and cerebral infarction without residual deficits: Secondary | ICD-10-CM | POA: Insufficient documentation

## 2023-08-09 DIAGNOSIS — S161XXA Strain of muscle, fascia and tendon at neck level, initial encounter: Secondary | ICD-10-CM | POA: Diagnosis not present

## 2023-08-09 DIAGNOSIS — W1839XA Other fall on same level, initial encounter: Secondary | ICD-10-CM | POA: Diagnosis not present

## 2023-08-09 DIAGNOSIS — J45909 Unspecified asthma, uncomplicated: Secondary | ICD-10-CM | POA: Insufficient documentation

## 2023-08-09 DIAGNOSIS — S29019A Strain of muscle and tendon of unspecified wall of thorax, initial encounter: Secondary | ICD-10-CM

## 2023-08-09 LAB — I-STAT CHEM 8, ED
BUN: 9 mg/dL (ref 8–23)
Calcium, Ion: 0.96 mmol/L — ABNORMAL LOW (ref 1.15–1.40)
Chloride: 105 mmol/L (ref 98–111)
Creatinine, Ser: 1.3 mg/dL — ABNORMAL HIGH (ref 0.61–1.24)
Glucose, Bld: 92 mg/dL (ref 70–99)
HCT: 39 % (ref 39.0–52.0)
Hemoglobin: 13.3 g/dL (ref 13.0–17.0)
Potassium: 3.4 mmol/L — ABNORMAL LOW (ref 3.5–5.1)
Sodium: 144 mmol/L (ref 135–145)
TCO2: 23 mmol/L (ref 22–32)

## 2023-08-09 NOTE — ED Notes (Signed)
Trauma Response Nurse Documentation   Taylor Myers is a 72 y.o. male arriving to [facility] ED via [facility]  On Eliquis (apixaban) daily. Trauma was activated as a Level 2 by Rande Brunt based on the following trauma criteria Elderly patients > 65 with head trauma on anti-coagulation (excluding ASA).  Patient cleared for CT by Dr. Bebe Shaggy. Pt transported to CT with trauma response nurse present to monitor. RN remained with the patient throughout their absence from the department for clinical observation.   GCS 15.  Trauma MD Arrival Time: N/A.  History   Past Medical History:  Diagnosis Date   A-fib (HCC)    Alcohol abuse, in remission    Arthritis    Asthma    child hood asthma   History of kidney stones    "dormant kidney stone"   Hypertension    Wheelchair dependent      Past Surgical History:  Procedure Laterality Date   ANKLE SURGERY Right    APPLICATION OF WOUND VAC Right 03/19/2022   Procedure: APPLICATION OF WOUND VAC;  Surgeon: Tarry Kos, MD;  Location: [facility] OR;  Service: Orthopedics;  Laterality: Right;   HIP SURGERY Right    total replacement   KNEE SURGERY     bilateral replacements   ORIF HUMERUS FRACTURE Right 03/19/2022   Procedure: OPEN REDUCTION INTERNAL FIXATION (ORIF) RIGHT DISTAL HUMERUS FRACTURE;  Surgeon: Tarry Kos, MD;  Location: [facility] OR;  Service: Orthopedics;  Laterality: Right;       Initial Focused Assessment (If applicable, or please see trauma documentation): Airway-- intact, no visible obstruction Breathing-- spontaneous, unlabored Circulation-- no obvious bleeding noted  CT's Completed:   CT Head and CT C-Spine, CT T-Spine, CT L-Spine   Interventions:  See event summary  Plan for disposition:  Discharge home   Consults completed:  none at 0136.  Event Summary: Patient brought in by [facility]. Patient fell this evening, found between nightstand and bed. Patient does not endorse hitting his head but  complains of neck pain. On arrival, patient transferred from EMS stretcher to hospital stretcher. Manual BP obtained. Trauma labs obtained. Patient placed in [place] [facility]. Xray chest completed. Patient to CT with TRN. CT head, c-spine, l-spine, t-spine completed.   MTP Summary (If applicable):  N/A  Bedside handoff with ED RN Renae Fickle.    Leota Sauers  Trauma Response RN  Please call TRN at [phone] for further assistance.

## 2023-08-09 NOTE — ED Provider Notes (Signed)
EMERGENCY DEPARTMENT AT [facility] Provider Note   CSN: [id number] Arrival date & time: 08/09/23  2314     History {Add pertinent medical, surgical, social history, OB history to HPI:1} Chief Complaint  Patient presents with   Fall    Patient found between wall and night stand. Able to siut up after help. Patient denies hitting head or LOC    KINCADE ALLTON is a 72 y.o. male.  The history is provided by the patient and the EMS personnel.   Patient history of alcohol use disorder, hypertension, atrial fibrillation on Eliquis presents after a fall.  EMS reports the patient has fallen twice today.  After the first fall he refused transport.  After the second fall he was found between the wall and nightstand.  Unclear if he hit his head, but no LOC.  He reports neck pain and right shoulder pain.  No chest or abdominal pain is reported. Patient's father called 911  Patient arrives as a level 2 trauma Past Medical History:  Diagnosis Date   A-fib (HCC)    Alcohol abuse, in remission    Arthritis    Asthma    child hood asthma   History of kidney stones    "dormant kidney stone"   Hypertension    Wheelchair dependent     Home Medications Prior to Admission medications   Medication Sig Start Date End Date Taking? Authorizing Provider  apixaban (ELIQUIS) 5 MG TABS tablet Take 1 tablet (5 mg total) by mouth 2 (two) times daily. 05/12/23   Little Ishikawa, MD  DHA-EPA-Flaxseed Oil-Vitamin E [facility] NUTRITION) CAPS Take 1 capsule by mouth daily.    [provider]  ibuprofen (ADVIL) 600 MG tablet Take 600 mg by mouth every 6 (six) hours as needed for moderate pain (pain score 4-6).    [provider]  metoprolol tartrate (LOPRESSOR) 25 MG tablet Take 0.5 tablets (12.5 mg total) by mouth 2 (two) times daily. 06/30/23 08/29/23  Willeen Niece, MD  Multiple Vitamins-Minerals (LIVER DETOX PO) Take 2 tablets by mouth daily.    [provider]  NON FORMULARY Take 2 capsules by mouth See admin instructions. Beet extract capsules- Take 2 capsules by mouth once a day    [provider]  tamsulosin (FLOMAX) 0.4 MG CAPS capsule Take 0.4 mg by mouth daily.    [provider]      Allergies    Patient has no known allergies.    Review of Systems   Review of Systems  Musculoskeletal:  Positive for arthralgias.    Physical Exam Updated Vital Signs BP 138/76   Pulse 100   Temp 98.3 F (36.8 C)   Resp 17   Ht 1.753 m (5\' 9" )   Wt 70 kg   SpO2 100%   BMI 22.79 kg/m  Physical Exam CONSTITUTIONAL: Disheveled, smells of alcohol HEAD: Normocephalic/atraumatic EYES: EOMI/PERRL, EOMI discharge from right eye ENMT: Mucous membranes moist SPINE/BACK: Diffuse cervical/thoracic/lumbar tenderness Patient maintained in spinal precautions/logroll utilized No bruising/crepitance/stepoffs noted to spine CV: S1/S2 noted, no murmurs/rubs/gallops noted LUNGS: Lungs are clear to auscultation bilaterally, no apparent distress Chest-no bruising or tenderness ABDOMEN: soft, nontender, no bruising NEURO: Pt is awake/alert/appropriate, moves all extremitiesx4.  No facial droop.   EXTREMITIES: pulses normal/equal, full ROM Pelvis stable All other extremities/joints palpated/ranged and nontender SKIN: warm, color normal  ED Results / Procedures / Treatments   Labs (all labs ordered are listed, but only abnormal results  are displayed) Labs Reviewed  COMPREHENSIVE METABOLIC PANEL  CBC  ETHANOL  URINALYSIS, ROUTINE W REFLEX MICROSCOPIC  I-STAT CHEM 8, ED    EKG None  Radiology No results found.  Procedures Procedures  {Document cardiac monitor, telemetry assessment procedure when appropriate:1}  Medications Ordered in ED Medications - No data to display  ED Course/ Medical Decision Making/ A&P   {   Click here for ABCD2, HEART and other calculatorsREFRESH Note before signing :1}                               Medical Decision Making Amount and/or Complexity of Data Reviewed Labs: ordered. Radiology: ordered. ECG/medicine tests: ordered.   This patient presents to the ED for concern of fall and head injury, this involves an extensive number of treatment options, and is a complaint that carries with it a high risk of complications and morbidity.  The differential diagnosis includes but is not limited to subdural hematoma, subarachnoid hemorrhage, skull fracture, concussion    Comorbidities that complicate the patient evaluation: Patient's presentation is complicated by their history of atrial fibrillation  Social Determinants of Health: Patient's  poor mobility and alcohol use   increases the complexity of managing their presentation  Additional history obtained: Additional history obtained from EMS  Records reviewed previous admission documents  Lab Tests: I Ordered, and personally interpreted labs.  The pertinent results include:  ***  Imaging Studies ordered: I ordered imaging studies including CT scan head and spinal imaging and chest x-ray   I independently visualized and interpreted imaging which showed *** I agree with the radiologist interpretation  Cardiac Monitoring: The patient was maintained on a cardiac monitor.  I personally viewed and interpreted the cardiac monitor which showed an underlying rhythm of:  {cardiac monitor:26849}  Medicines ordered and prescription drug management: I ordered medication including ***  for ***  Reevaluation of the patient after these medicines showed that the patient    {resolved/improved/worsened:23923::"improved"}  Test Considered: Patient is low risk / negative by ***, therefore do not feel that *** is indicated.  Critical Interventions:  ***  Consultations Obtained: I requested consultation with the {consultation:26851}, and discussed  findings as well as pertinent plan - they recommend: ***  Reevaluation: After  the interventions noted above, I reevaluated the patient and found that they have :{resolved/improved/worsened:23923::"improved"}  Complexity of problems addressed: Patient's presentation is most consistent with  {XBMW:41324}  Disposition: After consideration of the diagnostic results and the patient's response to treatment,  I feel that the patent would benefit from {disposition:26850}.     {Document critical care time when appropriate:1} {Document review of labs and clinical decision tools ie heart score, Chads2Vasc2 etc:1}  {Document your independent review of radiology images, and any outside records:1} {Document your discussion with family members, caretakers, and with consultants:1} {Document social determinants of health affecting pt's care:1} {Document your decision making why or why not admission, treatments were needed:1} Final Clinical Impression(s) / ED Diagnoses Final diagnoses:  None    Rx / DC Orders ED Discharge Orders     None

## 2023-08-10 LAB — COMPREHENSIVE METABOLIC PANEL
ALT: 17 U/L (ref 0–44)
AST: 34 U/L (ref 15–41)
Albumin: 3.5 g/dL (ref 3.5–5.0)
Alkaline Phosphatase: 61 U/L (ref 38–126)
Anion gap: 15 (ref 5–15)
BUN: 10 mg/dL (ref 8–23)
CO2: 23 mmol/L (ref 22–32)
Calcium: 8.6 mg/dL — ABNORMAL LOW (ref 8.9–10.3)
Chloride: 106 mmol/L (ref 98–111)
Creatinine, Ser: 1.06 mg/dL (ref 0.61–1.24)
GFR, Estimated: >60 mL/min (ref >60)
Glucose, Bld: 97 mg/dL (ref 70–99)
Potassium: 3.4 mmol/L — ABNORMAL LOW (ref 3.5–5.1)
Sodium: 144 mmol/L (ref 135–145)
Total Bilirubin: 0.9 mg/dL (ref <1.2)
Total Protein: 6.4 g/dL — ABNORMAL LOW (ref 6.5–8.1)

## 2023-08-10 LAB — CBC
HCT: 37.9 % — ABNORMAL LOW (ref 39.0–52.0)
Hemoglobin: 12.6 g/dL — ABNORMAL LOW (ref 13.0–17.0)
MCH: 35 pg — ABNORMAL HIGH (ref 26.0–34.0)
MCHC: 33.2 g/dL (ref 30.0–36.0)
MCV: 105.3 fL — ABNORMAL HIGH (ref 80.0–100.0)
Platelets: 276 10*3/uL (ref 150–400)
RBC: 3.6 MIL/uL — ABNORMAL LOW (ref 4.22–5.81)
RDW: 13.6 % (ref 11.5–15.5)
WBC: 3.9 10*3/uL — ABNORMAL LOW (ref 4.0–10.5)
nRBC: 0 % (ref 0.0–0.2)

## 2023-08-10 LAB — ETHANOL: Alcohol, Ethyl (B): 372 mg/dL (ref <10)

## 2023-08-10 NOTE — Discharge Instructions (Signed)

## 2023-08-10 NOTE — ED Notes (Signed)
Patient given dc instruction and had no questions. PTAR to be called. Patient stated he would have a way inside the living area.

## 2023-08-15 ENCOUNTER — Other Ambulatory Visit: Payer: Self-pay

## 2023-08-15 ENCOUNTER — Telehealth: Payer: Self-pay | Admitting: *Deleted

## 2023-08-15 ENCOUNTER — Emergency Department (HOSPITAL_COMMUNITY)
Admission: EM | Admit: 2023-08-15 | Discharge: 2023-08-15 | Disposition: A | Discharge status: Home / Self Care | Payer: Medicare Other | Attending: Emergency Medicine | Admitting: Emergency Medicine

## 2023-08-15 ENCOUNTER — Emergency Department (HOSPITAL_COMMUNITY): Payer: Medicare Other

## 2023-08-15 ENCOUNTER — Encounter (HOSPITAL_COMMUNITY): Payer: Self-pay | Admitting: *Deleted

## 2023-08-15 DIAGNOSIS — I10 Essential (primary) hypertension: Secondary | ICD-10-CM | POA: Diagnosis not present

## 2023-08-15 DIAGNOSIS — T1490XA Injury, unspecified, initial encounter: Secondary | ICD-10-CM

## 2023-08-15 DIAGNOSIS — F1090 Alcohol use, unspecified, uncomplicated: Secondary | ICD-10-CM | POA: Diagnosis not present

## 2023-08-15 DIAGNOSIS — S0990XA Unspecified injury of head, initial encounter: Secondary | ICD-10-CM | POA: Diagnosis present

## 2023-08-15 DIAGNOSIS — M25511 Pain in right shoulder: Secondary | ICD-10-CM | POA: Diagnosis not present

## 2023-08-15 DIAGNOSIS — I4891 Unspecified atrial fibrillation: Secondary | ICD-10-CM | POA: Diagnosis not present

## 2023-08-15 DIAGNOSIS — M25512 Pain in left shoulder: Secondary | ICD-10-CM | POA: Insufficient documentation

## 2023-08-15 DIAGNOSIS — W06XXXA Fall from bed, initial encounter: Secondary | ICD-10-CM | POA: Insufficient documentation

## 2023-08-15 DIAGNOSIS — J45909 Unspecified asthma, uncomplicated: Secondary | ICD-10-CM | POA: Diagnosis not present

## 2023-08-15 DIAGNOSIS — S060X0A Concussion without loss of consciousness, initial encounter: Secondary | ICD-10-CM | POA: Insufficient documentation

## 2023-08-15 DIAGNOSIS — W19XXXA Unspecified fall, initial encounter: Secondary | ICD-10-CM

## 2023-08-15 DIAGNOSIS — Z79899 Other long term (current) drug therapy: Secondary | ICD-10-CM | POA: Insufficient documentation

## 2023-08-15 DIAGNOSIS — Z7901 Long term (current) use of anticoagulants: Secondary | ICD-10-CM | POA: Insufficient documentation

## 2023-08-15 DIAGNOSIS — S161XXA Strain of muscle, fascia and tendon at neck level, initial encounter: Secondary | ICD-10-CM | POA: Insufficient documentation

## 2023-08-15 LAB — CBG MONITORING, ED: Glucose-Capillary: 87 mg/dL (ref 70–99)

## 2023-08-15 NOTE — Discharge Instructions (Signed)

## 2023-08-15 NOTE — ED Triage Notes (Signed)
Patient presents to ED via [facility] states he fell out of bed c/o neck and bilateral shoulder pain. States he has been drinking alcohol today, states he drink 3-4 shots of volka /day. Patient is alert oriented. Moves all ext. X 4

## 2023-08-15 NOTE — ED Provider Notes (Signed)
[place] EMERGENCY DEPARTMENT AT [facility] Provider Note   CSN: [id number] Arrival date & time: 08/15/23  4098     History  Chief Complaint - fall, head and neck injury  Taylor Myers is a 72 y.o. male.  The history is provided by the patient.  Patient with history of atrial fibrillation on Eliquis, alcohol use disorder presents after a fall. Patient presents via EMS as a level 2 trauma.  Patient reports he rolled out of bed and was unable to get up.  No LOC, but reports headache, neck pain and shoulder pain No other acute complaints    Past Medical History:  Diagnosis Date   A-fib (HCC)    Alcohol abuse, in remission    Arthritis    Asthma    child hood asthma   History of kidney stones    "dormant kidney stone"   Hypertension    Wheelchair dependent     Home Medications Prior to Admission medications   Medication Sig Start Date End Date Taking? Authorizing Provider  apixaban (ELIQUIS) 5 MG TABS tablet Take 1 tablet (5 mg total) by mouth 2 (two) times daily. 05/12/23   Little Ishikawa, MD  DHA-EPA-Flaxseed Oil-Vitamin E [facility] NUTRITION) CAPS Take 1 capsule by mouth daily.    [provider]  ibuprofen (ADVIL) 600 MG tablet Take 600 mg by mouth every 6 (six) hours as needed for moderate pain (pain score 4-6).    [provider]  metoprolol tartrate (LOPRESSOR) 25 MG tablet Take 0.5 tablets (12.5 mg total) by mouth 2 (two) times daily. 06/30/23 08/29/23  Willeen Niece, MD  Multiple Vitamins-Minerals (LIVER DETOX PO) Take 2 tablets by mouth daily.    [provider]  NON FORMULARY Take 2 capsules by mouth See admin instructions. Beet extract capsules- Take 2 capsules by mouth once a day    [provider]  tamsulosin (FLOMAX) 0.4 MG CAPS capsule Take 0.4 mg by mouth daily.    [provider]      Allergies    Patient has no known allergies.    Review of Systems   Review of Systems  Musculoskeletal:   Positive for arthralgias and neck pain.  Neurological:  Positive for headaches.    Physical Exam Updated Vital Signs BP 122/88 (BP Location: Right Arm)   Pulse 91   Temp 97.8 F (36.6 C) (Oral)   Resp 18   Ht 1.753 m (5\' 9" )   Wt 70 kg   SpO2 100%   BMI 22.79 kg/m  Physical Exam CONSTITUTIONAL: Disheveled, smells of alcohol HEAD: Normocephalic/atraumatic, no visible trauma EYES: EOMI/PERRL ENMT: Mucous membranes moist, no facial trauma NECK: C-collar in place SPINE/BACK: Cervical spine tenderness noted, no thoracic or lumbar tenderness No bruising/crepitance/stepoffs noted to spine CV: S1/S2 noted, no murmurs/rubs/gallops noted LUNGS: Lungs are clear to auscultation bilaterally, no apparent distress Chest-no tenderness or crepitus ABDOMEN: soft, nontender NEURO: Pt is awake/alert/appropriate, moves all extremitiesx4.  No facial droop.   EXTREMITIES: pulses normal/equal, full ROM, pelvis appears stable All other extremities/joints palpated/ranged and nontender SKIN: warm, color normal  ED Results / Procedures / Treatments   Labs (all labs ordered are listed, but only abnormal results are displayed) Labs Reviewed  CBG MONITORING, ED    EKG None  Radiology DG Chest [facility] 1 View Result Date: 08/15/2023 CLINICAL DATA:  Trauma, fall from bed EXAM: PORTABLE CHEST 1 VIEW COMPARISON:  08/09/2023 FINDINGS: Cardiomegaly with prominent rightward extension related to rotation and also  seen previously. There is no edema, consolidation, effusion, or pneumothorax. No acute osseous finding. Remote proximal left humerus fracture with advanced glenohumeral degeneration. Artifact from EKG leads. IMPRESSION: Stable from prior.  No acute finding. Electronically Signed   By: Tiburcio Pea M.D.   On: 08/15/2023 05:07   CT HEAD WO CONTRAST Result Date: 08/15/2023 CLINICAL DATA:  Blunt poly trauma EXAM: CT HEAD WITHOUT CONTRAST CT CERVICAL SPINE WITHOUT CONTRAST TECHNIQUE: Multidetector CT  imaging of the head and cervical spine was performed following the standard protocol without intravenous contrast. Multiplanar CT image reconstructions of the cervical spine were also generated. RADIATION DOSE REDUCTION: This exam was performed according to the departmental dose-optimization program which includes automated exposure control, adjustment of the mA and/or kV according to patient size and/or use of iterative reconstruction technique. COMPARISON:  08/09/2023 FINDINGS: CT HEAD FINDINGS Brain: No evidence of acute infarction, hemorrhage, hydrocephalus, extra-axial collection or mass lesion/mass effect. Generalized atrophy. Sequela of presumed right trigeminal nerve decompression. Small chronic left cerebellar infarct. Vascular: No hyperdense vessel or unexpected calcification. Skull: Negative for fracture or bone lesion. Sinuses/Orbits: Remote blowout fracture of the left orbital floor. CT CERVICAL SPINE FINDINGS Alignment: No traumatic malalignment. Fused anterolisthesis at C4-5. Similar degree of anterolisthesis at C7-T1. Skull base and vertebrae: No acute fracture. No primary bone lesion or focal pathologic process. Soft tissues and spinal canal: No prevertebral fluid or swelling. No visible canal hematoma. Disc levels: Bulky spondylitic spurring with intervertebral notable foraminal impingement at C7-T1 due to disc collapse, anterolisthesis, and ridging. Ankylosis at C3-C6. Upper chest: No evidence of injury. IMPRESSION: No evidence of acute intracranial or cervical spine injury. Electronically Signed   By: Tiburcio Pea M.D.   On: 08/15/2023 05:02   CT CERVICAL SPINE WO CONTRAST Result Date: 08/15/2023 CLINICAL DATA:  Blunt poly trauma EXAM: CT HEAD WITHOUT CONTRAST CT CERVICAL SPINE WITHOUT CONTRAST TECHNIQUE: Multidetector CT imaging of the head and cervical spine was performed following the standard protocol without intravenous contrast. Multiplanar CT image reconstructions of the cervical  spine were also generated. RADIATION DOSE REDUCTION: This exam was performed according to the departmental dose-optimization program which includes automated exposure control, adjustment of the mA and/or kV according to patient size and/or use of iterative reconstruction technique. COMPARISON:  08/09/2023 FINDINGS: CT HEAD FINDINGS Brain: No evidence of acute infarction, hemorrhage, hydrocephalus, extra-axial collection or mass lesion/mass effect. Generalized atrophy. Sequela of presumed right trigeminal nerve decompression. Small chronic left cerebellar infarct. Vascular: No hyperdense vessel or unexpected calcification. Skull: Negative for fracture or bone lesion. Sinuses/Orbits: Remote blowout fracture of the left orbital floor. CT CERVICAL SPINE FINDINGS Alignment: No traumatic malalignment. Fused anterolisthesis at C4-5. Similar degree of anterolisthesis at C7-T1. Skull base and vertebrae: No acute fracture. No primary bone lesion or focal pathologic process. Soft tissues and spinal canal: No prevertebral fluid or swelling. No visible canal hematoma. Disc levels: Bulky spondylitic spurring with intervertebral notable foraminal impingement at C7-T1 due to disc collapse, anterolisthesis, and ridging. Ankylosis at C3-C6. Upper chest: No evidence of injury. IMPRESSION: No evidence of acute intracranial or cervical spine injury. Electronically Signed   By: Tiburcio Pea M.D.   On: 08/15/2023 05:02    Procedures Procedures    Medications Ordered in ED Medications - No data to display  ED Course/ Medical Decision Making/ A&P Clinical Course as of 08/15/23 0533  Mon Aug 15, 2023  1308 Patient presents for recurrent fall.  He was just seen on the 17th for similar episode.  Patient  has history of alcohol use disorder which typically contributes to his frequent falls.  Patient is on anticoagulation therefore he arrives as a level 2 trauma.  Imaging has been ordered [DW]  0532 Patient resting comfortably.   He will have intermittent episodes of tachycardia due to his underlying A-fib.  Otherwise no acute distress.  No other signs of acute traumatic injury.  Patient is intoxicated but is otherwise awake and alert and answering questions appropriately.  He does report a chronic wound to his posterior thighs that he had me examine it does not appear grossly infected [DW]  0533 Discussed with patient that he needs to speak with his cardiologist about whether he should continue DOAC in the setting of heavy alcohol use.  Patient agrees with this plan [DW]    Clinical Course User Index [DW] Zadie Rhine, MD                                 Medical Decision Making Amount and/or Complexity of Data Reviewed Radiology: ordered.   This patient presents to the ED for concern of fall with head injury, this involves an extensive number of treatment options, and is a complaint that carries with it a high risk of complications and morbidity.  The differential diagnosis includes but is not limited to subdural hematoma, subarachnoid hemorrhage, skull fracture, concussion   Comorbidities that complicate the patient evaluation: Patient's presentation is complicated by their history of atrial fibrillation  Social Determinants of Health: Patient's  alcohol use disorder   increases the complexity of managing their presentation  Additional history obtained: Additional history obtained from EMS  Records reviewed previous admission documents   Imaging Studies ordered: I ordered imaging studies including CT scan head and C-spine and X-ray chest   I independently visualized and interpreted imaging which showed no acute finding I agree with the radiologist interpretation  Cardiac Monitoring: The patient was maintained on a cardiac monitor.  I personally viewed and interpreted the cardiac monitor which showed an underlying rhythm of:  Atrial Fibrillation   Reevaluation: After the interventions noted above, I  reevaluated the patient and found that they have :improved  Complexity of problems addressed: Patient's presentation is most consistent with  acute presentation with potential threat to life or bodily function  Disposition: After consideration of the diagnostic results and the patient's response to treatment,  I feel that the patent would benefit from discharge   .          Final Clinical Impression(s) / ED Diagnoses Final diagnoses:  Trauma  Fall, initial encounter  Concussion without loss of consciousness, initial encounter  Strain of neck muscle, initial encounter  Alcohol use disorder    Rx / DC Orders ED Discharge Orders     None         Zadie Rhine, MD 08/15/23 [phone]

## 2023-08-15 NOTE — ED Notes (Signed)
Ptar called no eta given

## 2023-08-15 NOTE — ED Notes (Signed)
Returned back from CT.

## 2023-08-15 NOTE — Progress Notes (Signed)
Orthopedic Tech Progress Note Patient Details:  Taylor Myers 28-Nov-1950 [id number]  Patient ID: Taylor Myers, male   DOB: [birth year], 72 y.o.   MRN: [id number] I attended trauma page. Trinna Post 08/15/2023, 4:48 AM

## 2023-08-15 NOTE — ED Notes (Signed)
Patient transported to CT 

## 2023-08-16 ENCOUNTER — Ambulatory Visit: Payer: Self-pay

## 2023-08-16 NOTE — Patient Outreach (Signed)
  Care Coordination   08/16/2023 Name: Taylor Myers MRN: [id number] DOB: Sep 05, 1950   Care Coordination Outreach Attempts:  An unsuccessful outreach was attempted for an appointment today.  Follow Up Plan:  Additional outreach attempts will be made to offer the patient complex care management information and services.   Encounter Outcome:  No Answer   Care Coordination Interventions:  No, not indicated    Delsa Sale RN BSN CCM [place]  [facility] Health Nurse Care Coordinator  Direct Dial: [phone] Website: [email or web address]

## 2023-08-19 ENCOUNTER — Telehealth: Payer: Self-pay | Admitting: *Deleted

## 2023-08-19 ENCOUNTER — Ambulatory Visit: Payer: Medicare Other | Admitting: Cardiology

## 2023-08-19 NOTE — Telephone Encounter (Signed)
Called and spoke to patient and patient verbalized that is can not make it to his appt. Patient verbalizes he waiting for his wheelchair to be repaired. Appt made for patient as requested and Dr. Bjorn Pippin made aware.

## 2023-08-19 NOTE — Progress Notes (Deleted)
Cardiology Office Note:    Date:  08/19/2023   ID:  Taylor Myers, DOB [birth year], MRN [id number]  PCP:  Gaspar Garbe, MD  Cardiologist:  Little Ishikawa, MD  Electrophysiologist:  None   Referring MD: Gaspar Garbe, MD   No chief complaint on file.   History of Present Illness:    Taylor Myers is a 72 y.o. male with a hx of atrial fibrillation, PAD, CVA, T2DM, hypertension, alcohol abuse who presents for follow-up.  He was admitted 06/2022 with altered mental status and falls.  Cardiology was consulted for atrial fibrillation.  He was noted on telemetry to be in A-fib with rates down to 30s to 40s and pauses up to 3 seconds.  Home metoprolol and digoxin were held with improvement in heart rate.  He was continued on Eliquis for anticoagulation.  Echocardiogram 07/13/2022 showed EF 55 to 60%, normal RV function, mild biatrial enlargement, no significant valvular disease.  Zio patch x 14 days on 08/17/2022 showed 100% A-fib burden, average rate 82 bpm, occasional PVCs (2.6% of beats), 17 episodes of NSVT with longest lasting 17 beats.  Since last clinic visit, he has had multiple ED visits with falls (5 times in last month).    Discharge from hospital, he reports he has been doing okay.  Had some lightheadedness recently, called EMS and received IV fluids.  No issues since.  Denies any chest pain, dyspnea, lower extremity edema, or palpitations.  Reports BP has been well-controlled.  Denies any bleeding on Eliquis.  He is pretty much wheelchair-bound.  He did have a fall recently where he slid to the ground while trying to get into his bed.   Past Medical History:  Diagnosis Date   A-fib (HCC)    Alcohol abuse, in remission    Arthritis    Asthma    child hood asthma   History of kidney stones    "dormant kidney stone"   Hypertension    Wheelchair dependent     Past Surgical History:  Procedure Laterality Date   ANKLE SURGERY Right    APPLICATION OF WOUND  VAC Right 03/19/2022   Procedure: APPLICATION OF WOUND VAC;  Surgeon: Tarry Kos, MD;  Location: [facility] OR;  Service: Orthopedics;  Laterality: Right;   HIP SURGERY Right    total replacement   KNEE SURGERY     bilateral replacements   ORIF HUMERUS FRACTURE Right 03/19/2022   Procedure: OPEN REDUCTION INTERNAL FIXATION (ORIF) RIGHT DISTAL HUMERUS FRACTURE;  Surgeon: Tarry Kos, MD;  Location: [facility] OR;  Service: Orthopedics;  Laterality: Right;    Current Medications: No outpatient medications have been marked as taking for the 08/19/23 encounter (Appointment) with Little Ishikawa, MD.     Allergies:   Patient has no known allergies.   Social History   Socioeconomic History   Marital status: Divorced    Spouse name: Not on file   Number of children: Not on file   Years of education: Not on file   Highest education level: Not on file  Occupational History   Not on file  Tobacco Use   Smoking status: Former    Types: Cigarettes   Smokeless tobacco: Never  Vaping Use   Vaping status: Never Used  Substance and Sexual Activity   Alcohol use: Yes    Alcohol/week: 2.0 standard drinks of alcohol    Types: 2 Standard drinks or equivalent per week    Comment: occasionally   Drug  use: Not Currently   Sexual activity: Not on file  Other Topics Concern   Not on file  Social History Narrative   Not on file   Social Drivers of Health   Financial Resource Strain: Not on file  Food Insecurity: No Food Insecurity (08/02/2023)   Hunger Vital Sign    Worried About Running Out of Food in the Last Year: Never true    Ran Out of Food in the Last Year: Never true  Transportation Needs: No Transportation Needs (08/02/2023)   PRAPARE - Administrator, Civil Service (Medical): No    Lack of Transportation (Non-Medical): No  Physical Activity: Inactive (08/02/2023)   Exercise Vital Sign    Days of Exercise per Week: 0 days    Minutes of Exercise per Session: 0 min   Stress: Not on file  Social Connections: Not on file     Family History: The patient's family history is not on file.  ROS:   Please see the history of present illness.     All other systems reviewed and are negative.  EKGs/Labs/Other Studies Reviewed:    The following studies were reviewed today:   EKG:   10/04/2022: Atrial fibrillation, PVCs, rate 83, right bundle branch block, left axis deviation, poor R wave progression, T wave inversions in leads V4-5  Recent Labs: 06/29/2023: TSH 1.665 06/30/2023: Magnesium 2.5 08/09/2023: ALT 17; BUN 9; Creatinine, Ser 1.30; Hemoglobin 13.3; Platelets 276; Potassium 3.4; Sodium 144  Recent Lipid Panel    Component Value Date/Time   CHOL 153 10/04/2022 1019   TRIG 105 10/04/2022 1019   HDL 87 10/04/2022 1019   CHOLHDL 1.8 10/04/2022 1019   LDLCALC 48 10/04/2022 1019    Physical Exam:    VS:  There were no vitals taken for this visit.    Wt Readings from Last 3 Encounters:  08/15/23 154 lb 5.2 oz (70 kg)  08/09/23 154 lb 5.2 oz (70 kg)  07/20/23 155 lb (70.3 kg)     GEN:  Well nourished, well developed in no acute distress HEENT: Normal NECK: No JVD; No carotid bruits LYMPHATICS: No lymphadenopathy CARDIAC: Irregular, normal rate, no murmurs, rubs, gallops RESPIRATORY:  Clear to auscultation without rales, wheezing or rhonchi  ABDOMEN: Soft, non-tender, non-distended MUSCULOSKELETAL:  No edema; No deformity  SKIN: Warm and dry NEUROLOGIC:  Alert and oriented x 3 PSYCHIATRIC:  Normal affect   ASSESSMENT:    No diagnosis found.  PLAN:    Atrial fibrillation: Likely permanent Afib, reports has been in Afib for years.  During admission 06/2022 noted to have A-fib with significant bradycardia down to heart rate 30s to 40s and pauses up to 3 seconds.  Home metoprolol digoxin were discontinued.  Echocardiogram 07/13/2022 showed EF 55 to 60%, normal RV function, mild biatrial enlargement, no significant valvular disease.  Zio  patch x 14 days on 08/17/2022 showed 100% A-fib burden, average rate 82 bpm, occasional PVCs (2.6% of beats), 17 episodes of NSVT with longest lasting 17 beats. -Rates appear controlled off AV nodal blockers -Continue Eliquis 5 mg twice daily -Check sleep study  Preop evaluation: planning cataract surgery.  Low risk procedure, no further cardiac workup recommended prior to procedure.  OK to hold Eliquis x 2 days prior to procedure.    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  Appears irregular rhythm during episodes, suspect A-fib with aberrancy.  Check electrolytes, TSH  Hypertension: On amlodipine 5 mg  daily.  It appears controlled  Hyperlipidemia: On atorvastatin 40 mg daily.  LDL 48 on 10/23/21  PAD: Reported history, continue Eliquis and atorvastatin. Check ABIs  Daytime somnolence: Check [place] sleep study.  STOPBANG 3   RTC in 6 months   Medication Adjustments/Labs and Tests Ordered: Current medicines are reviewed at length with the patient today.  Concerns regarding medicines are outlined above.  No orders of the defined types were placed in this encounter.  No orders of the defined types were placed in this encounter.   There are no Patient Instructions on file for this visit.   Signed, Little Ishikawa, MD  08/19/2023 6:03 AM    [facility]

## 2023-08-22 NOTE — Telephone Encounter (Signed)
Spoke to patient and patient verbalized he will come to office for next scheduled appt 09/02/2023. Patient he is doing good and someone should be coming to repair or replace his wheelchair soon. Made patient aware to call office for any concerns. Patient voiced an understanding.Marland Kitchen

## 2023-08-23 ENCOUNTER — Telehealth: Payer: Self-pay | Admitting: *Deleted

## 2023-08-23 NOTE — Progress Notes (Signed)
Patient was a trauma patient on anticoagulation with head trauma, standard of care is to get CT head

## 2023-08-23 NOTE — Telephone Encounter (Signed)
Called patient and unable to leave message

## 2023-09-02 ENCOUNTER — Encounter: Payer: Self-pay | Admitting: Cardiology

## 2023-09-02 ENCOUNTER — Ambulatory Visit: Payer: Medicare Other | Attending: Cardiology | Admitting: Cardiology

## 2023-09-02 VITALS — BP 163/90 | HR 87 | Ht 71.0 in | Wt 166.0 lb

## 2023-09-02 DIAGNOSIS — G4733 Obstructive sleep apnea (adult) (pediatric): Secondary | ICD-10-CM

## 2023-09-02 DIAGNOSIS — I4821 Permanent atrial fibrillation: Secondary | ICD-10-CM

## 2023-09-02 DIAGNOSIS — E785 Hyperlipidemia, unspecified: Secondary | ICD-10-CM

## 2023-09-02 DIAGNOSIS — R29898 Other symptoms and signs involving the musculoskeletal system: Secondary | ICD-10-CM | POA: Diagnosis present

## 2023-09-02 DIAGNOSIS — I739 Peripheral vascular disease, unspecified: Secondary | ICD-10-CM | POA: Diagnosis present

## 2023-09-02 DIAGNOSIS — I10 Essential (primary) hypertension: Secondary | ICD-10-CM | POA: Diagnosis not present

## 2023-09-02 MED ORDER — AMLODIPINE BESYLATE 5 MG PO TABS: 5.0000 mg | ORAL_TABLET | Freq: Every day | ORAL | 3 refills | Status: AC

## 2023-09-02 MED ORDER — ATORVASTATIN CALCIUM 40 MG PO TABS: 40.0000 mg | ORAL_TABLET | Freq: Every day | ORAL | 3 refills | Status: AC

## 2023-09-02 MED ORDER — APIXABAN 5 MG PO TABS: 5.0000 mg | ORAL_TABLET | Freq: Two times a day (BID) | ORAL | 3 refills | Status: DC

## 2023-09-02 NOTE — Progress Notes (Signed)
 Cardiology Office Note:    Date:  09/02/2023   ID:  Taylor Myers, DOB [birth year], MRN [id number]  PCP:  Tisovec, Richard W, MD  Cardiologist:  Lonni LITTIE Nanas, MD  Electrophysiologist:  None   Referring MD: Vernadine Charlie ORN, MD   Chief Complaint  Patient presents with   Atrial Fibrillation         History of Present Illness:    Taylor Myers is a 73 y.o. male with a hx of atrial fibrillation, PAD, CVA, T2DM, hypertension, alcohol  abuse who presents for follow-up.  He was admitted 06/2022 with altered mental status and falls.  Cardiology was consulted for atrial fibrillation.  He was noted on telemetry to be in A-fib with rates down to 30s to 40s and pauses up to 3 seconds.  Home metoprolol  and digoxin  were held with improvement in heart rate.  He was continued on Eliquis  for anticoagulation.  Echocardiogram 07/13/2022 showed EF 55 to 60%, normal RV function, mild biatrial enlargement, no significant valvular disease.  Zio patch x 14 days on 08/17/2022 showed 100% A-fib burden, average rate 82 bpm, occasional PVCs (2.6% of beats), 17 episodes of NSVT with longest lasting 17 beats.  Since last clinic visit, he has had multiple ED visits with falls (5 times in last month).  He was admitted with A-fib with RVR 06/2023, started on metoprolol  12.5 mg twice daily.  Denies any bleeding issues.  Currently out of all medications, off for 2 weeks.  States that he does currently have any alcohol  in his house, has had none in last 4-5 days. Denies any chest pain, dyspnea, lightheadedness, syncope, lower extremity edema, or palpitations.   Past Medical History:  Diagnosis Date   A-fib (HCC)    Alcohol  abuse, in remission    Arthritis    Asthma    child hood asthma   History of kidney stones    dormant kidney stone   Hypertension    Wheelchair dependent     Past Surgical History:  Procedure Laterality Date   ANKLE SURGERY Right    APPLICATION OF WOUND VAC Right 03/19/2022    Procedure: APPLICATION OF WOUND VAC;  Surgeon: Jerri Kay HERO, MD;  Location: [facility] OR;  Service: Orthopedics;  Laterality: Right;   HIP SURGERY Right    total replacement   KNEE SURGERY     bilateral replacements   ORIF HUMERUS FRACTURE Right 03/19/2022   Procedure: OPEN REDUCTION INTERNAL FIXATION (ORIF) RIGHT DISTAL HUMERUS FRACTURE;  Surgeon: Jerri Kay HERO, MD;  Location: [facility] OR;  Service: Orthopedics;  Laterality: Right;    Current Medications: Current Meds  Medication Sig   amLODipine  (NORVASC ) 5 MG tablet Take 1 tablet (5 mg total) by mouth daily.   atorvastatin  (LIPITOR) 40 MG tablet Take 1 tablet (40 mg total) by mouth daily.   DHA-EPA-Flaxseed Oil-Vitamin E (THERATEARS NUTRITION) CAPS Take 1 capsule by mouth daily.   ibuprofen (ADVIL) 600 MG tablet Take 600 mg by mouth every 6 (six) hours as needed for moderate pain (pain score 4-6).   Multiple Vitamins-Minerals (LIVER DETOX PO) Take 2 tablets by mouth daily.   NON FORMULARY Take 2 capsules by mouth See admin instructions. Beet extract capsules- Take 2 capsules by mouth once a day   tamsulosin  (FLOMAX ) 0.4 MG CAPS capsule Take 0.4 mg by mouth daily.     Allergies:   Patient has no known allergies.   Social History   Socioeconomic History   Marital status: Divorced  Spouse name: Not on file   Number of children: Not on file   Years of education: Not on file   Highest education level: Not on file  Occupational History   Not on file  Tobacco Use   Smoking status: Former    Types: Cigarettes   Smokeless tobacco: Never  Vaping Use   Vaping status: Never Used  Substance and Sexual Activity   Alcohol  use: Yes    Alcohol /week: 2.0 standard drinks of alcohol     Types: 2 Standard drinks or equivalent per week    Comment: occasionally   Drug use: Not Currently   Sexual activity: Not on file  Other Topics Concern   Not on file  Social History Narrative   Not on file   Social Drivers of Health   Financial Resource  Strain: Not on file  Food Insecurity: No Food Insecurity (08/02/2023)   Hunger Vital Sign    Worried About Running Out of Food in the Last Year: Never true    Ran Out of Food in the Last Year: Never true  Transportation Needs: No Transportation Needs (08/02/2023)   PRAPARE - Administrator, Civil Service (Medical): No    Lack of Transportation (Non-Medical): No  Physical Activity: Inactive (08/02/2023)   Exercise Vital Sign    Days of Exercise per Week: 0 days    Minutes of Exercise per Session: 0 min  Stress: Not on file  Social Connections: Not on file     Family History: The patient's family history is not on file.  ROS:   Please see the history of present illness.     All other systems reviewed and are negative.  EKGs/Labs/Other Studies Reviewed:    The following studies were reviewed today:   EKG:   09/02/23: Atrial fibrillation, right bundle branch block, rate 87 10/04/2022: Atrial fibrillation, PVCs, rate 83, right bundle branch block, left axis deviation, poor R wave progression, T wave inversions in leads V4-5  Recent Labs: 06/29/2023: TSH 1.665 06/30/2023: Magnesium  2.5 08/09/2023: ALT 17; BUN 9; Creatinine, Ser 1.30; Hemoglobin 13.3; Platelets 276; Potassium 3.4; Sodium 144  Recent Lipid Panel    Component Value Date/Time   CHOL 153 10/04/2022 1019   TRIG 105 10/04/2022 1019   HDL 87 10/04/2022 1019   CHOLHDL 1.8 10/04/2022 1019   LDLCALC 48 10/04/2022 1019    Physical Exam:    VS:  BP (!) 163/90 (BP Location: Left Arm, Patient Position: Sitting, Cuff Size: Normal)   Pulse 87   Ht 5' 11 (1.803 m)   Wt 166 lb (75.3 kg)   BMI 23.15 kg/m     Wt Readings from Last 3 Encounters:  09/02/23 166 lb (75.3 kg)  08/15/23 154 lb 5.2 oz (70 kg)  08/09/23 154 lb 5.2 oz (70 kg)     GEN:  Well nourished, well developed in no acute distress HEENT: Normal NECK: No JVD; No carotid bruits CARDIAC: Irregular, normal rate, no murmurs, rubs,  gallops RESPIRATORY:  Clear to auscultation without rales, wheezing or rhonchi  ABDOMEN: Soft, non-tender, non-distended MUSCULOSKELETAL:  No edema; No deformity  SKIN: Warm and dry NEUROLOGIC:  Alert and oriented x 3 PSYCHIATRIC:  Normal affect   ASSESSMENT:    1. Permanent atrial fibrillation (HCC)   2. Essential hypertension   3. OSA (obstructive sleep apnea)   4. Weakness of both legs   5. Hyperlipidemia, unspecified hyperlipidemia type   6. PAD (peripheral artery disease) (HCC)  PLAN:    Atrial fibrillation: Likely permanent Afib, reports has been in Afib for years.  During admission 06/2022 noted to have A-fib with significant bradycardia down to heart rate 30s to 40s and pauses up to 3 seconds.  Home metoprolol  digoxin  were discontinued.  Echocardiogram 07/13/2022 showed EF 55 to 60%, normal RV function, mild biatrial enlargement, no significant valvular disease.  Zio patch x 14 days on 08/17/2022 showed 100% A-fib burden, average rate 82 bpm, occasional PVCs (2.6% of beats), 17 episodes of NSVT with longest lasting 17 beats. -Rates appear controlled off AV nodal blockers -He has been on Eliquis  5 mg BID.  Complicated situation as would recommend Eliquis  given his stroke risk from Afib (CHADS-VASc 5: hypertension, age, CVA, vascular disease) but he has had recurrent falls due to alcohol  abuse.  We discussed risks and benefits of anticoagulation.  He states that he currently has no alcohol  in his house and believes he can stay away from alcohol  and would like to continue Eliquis .  Will refill Eliquis  5 mg BID  NSVT: 17 episodes of NSVT with longest lasting 17 beats on Zio patch 08/17/2022.  Appears irregular rhythm during episodes, suspect A-fib with aberrancy.    Hypertension: Was on amlodipine  5 mg daily but appears has been discontinued.  BP elevated, would recommend restarting amlodipine  5 mg daily  Hyperlipidemia: On atorvastatin  40 mg daily.  LDL 70 on 01/20/2023.  He has  been off atorvastatin , will restart  PAD: Reported history, continue Eliquis  and atorvastatin .  Normal ABIs 09/2022  OSA: Severe OSA on sleep study 09/2022.  Did not start CPAP.  He is agreeable to seeing sleep medicine, will refer  Leg weakness: requests referral to PT, will refer   RTC in 3 months   Medication Adjustments/Labs and Tests Ordered: Current medicines are reviewed at length with the patient today.  Concerns regarding medicines are outlined above.  Orders Placed This Encounter  Procedures   Comprehensive Metabolic Panel (CMET)   Magnesium    CBC w/Diff/Platelet   Ambulatory referral to Sleep Studies   Ambulatory referral to Pulmonology   Ambulatory referral to Physical Therapy   EKG 12-Lead   Meds ordered this encounter  Medications   apixaban  (ELIQUIS ) 5 MG TABS tablet    Sig: Take 1 tablet (5 mg total) by mouth 2 (two) times daily.    Dispense:  90 tablet    Refill:  3    30 day supply   amLODipine  (NORVASC ) 5 MG tablet    Sig: Take 1 tablet (5 mg total) by mouth daily.    Dispense:  90 tablet    Refill:  3   atorvastatin  (LIPITOR) 40 MG tablet    Sig: Take 1 tablet (40 mg total) by mouth daily.    Dispense:  90 tablet    Refill:  3    Patient Instructions  Medication Instructions:  Start Amlodipine  5mg  daily Start Atorvastatin  40 mg daily Stop Metoprolol  25 mg daily Continue all current medications *If you need a refill on your cardiac medications before your next appointment, please call your pharmacy*   Lab Work: Cmet, mg, cbc today If you have labs (blood work) drawn today and your tests are completely normal, you will receive your results only by: MyChart Message (if you have MyChart) OR A paper copy in the mail If you have any lab test that is abnormal or we need to change your treatment, we will call you to review the results.   Testing/Procedures: none  Follow-Up: At [facility], you and your health needs are our priority.   As part of our continuing mission to provide you with exceptional heart care, we have created designated Provider Care Teams.  These Care Teams include your primary Cardiologist (physician) and Advanced Practice Providers (APPs -  Physician Assistants and Nurse Practitioners) who all work together to provide you with the care you need, when you need it.  We recommend signing up for the patient portal called MyChart.  Sign up information is provided on this After Visit Summary.  MyChart is used to connect with patients for Virtual Visits (Telemedicine).  Patients are able to view lab/test results, encounter notes, upcoming appointments, etc.  Non-urgent messages can be sent to your provider as well.   To learn more about what you can do with MyChart, go to [email or web address].    Your next appointment:   3 month(s)  Provider:   Lonni LITTIE Nanas, MD     Other Instructions Referral to [facility] Medicine Referral to [facility]   Signed, Lonni LITTIE Nanas, MD  09/02/2023 11:17 AM    [facility]

## 2023-09-02 NOTE — Patient Instructions (Addendum)
 Medication Instructions:  Start Amlodipine  5mg  daily Start Atorvastatin  40 mg daily Stop Metoprolol  25 mg daily Continue all current medications *If you need a refill on your cardiac medications before your next appointment, please call your pharmacy*   Lab Work: Cmet, mg, cbc today If you have labs (blood work) drawn today and your tests are completely normal, you will receive your results only by: MyChart Message (if you have MyChart) OR A paper copy in the mail If you have any lab test that is abnormal or we need to change your treatment, we will call you to review the results.   Testing/Procedures: none   Follow-Up: At [facility], you and your health needs are our priority.  As part of our continuing mission to provide you with exceptional heart care, we have created designated Provider Care Teams.  These Care Teams include your primary Cardiologist (physician) and Advanced Practice Providers (APPs -  Physician Assistants and Nurse Practitioners) who all work together to provide you with the care you need, when you need it.  We recommend signing up for the patient portal called MyChart.  Sign up information is provided on this After Visit Summary.  MyChart is used to connect with patients for Virtual Visits (Telemedicine).  Patients are able to view lab/test results, encounter notes, upcoming appointments, etc.  Non-urgent messages can be sent to your provider as well.   To learn more about what you can do with MyChart, go to [email or web address].    Your next appointment:   3 month(s)  Provider:   Lonni LITTIE Nanas, MD     Other Instructions Referral to [facility] Sleep Medicine Referral to [facility]

## 2023-09-03 LAB — COMPREHENSIVE METABOLIC PANEL
ALT: 19 [IU]/L (ref 0–44)
AST: 32 [IU]/L (ref 0–40)
Albumin: 3.7 g/dL — ABNORMAL LOW (ref 3.8–4.8)
Alkaline Phosphatase: 89 [IU]/L (ref 44–121)
BUN/Creatinine Ratio: 11 (ref 10–24)
BUN: 9 mg/dL (ref 8–27)
Bilirubin Total: 0.7 mg/dL (ref 0.0–1.2)
CO2: 26 mmol/L (ref 20–29)
Calcium: 8.1 mg/dL — ABNORMAL LOW (ref 8.6–10.2)
Chloride: 103 mmol/L (ref 96–106)
Creatinine, Ser: 0.79 mg/dL (ref 0.76–1.27)
Globulin, Total: 2.8 g/dL (ref 1.5–4.5)
Glucose: 74 mg/dL (ref 70–99)
Potassium: 3.8 mmol/L (ref 3.5–5.2)
Sodium: 146 mmol/L — ABNORMAL HIGH (ref 134–144)
Total Protein: 6.5 g/dL (ref 6.0–8.5)
eGFR: 93 mL/min/{1.73_m2} (ref >59)

## 2023-09-03 LAB — CBC WITH DIFFERENTIAL/PLATELET
Basophils Absolute: 0.1 10*3/uL (ref 0.0–0.2)
Basos: 1 %
EOS (ABSOLUTE): 0 10*3/uL (ref 0.0–0.4)
Eos: 0 %
Hematocrit: 36.9 % — ABNORMAL LOW (ref 37.5–51.0)
Hemoglobin: 12.4 g/dL — ABNORMAL LOW (ref 13.0–17.7)
Immature Grans (Abs): 0 10*3/uL (ref 0.0–0.1)
Immature Granulocytes: 1 %
Lymphocytes Absolute: 0.8 10*3/uL (ref 0.7–3.1)
Lymphs: 13 %
MCH: 35 pg — ABNORMAL HIGH (ref 26.6–33.0)
MCHC: 33.6 g/dL (ref 31.5–35.7)
MCV: 104 fL — ABNORMAL HIGH (ref 79–97)
Monocytes Absolute: 0.4 10*3/uL (ref 0.1–0.9)
Monocytes: 6 %
Neutrophils Absolute: 5.1 10*3/uL (ref 1.4–7.0)
Neutrophils: 79 %
Platelets: 189 10*3/uL (ref 150–450)
RBC: 3.54 x10E6/uL — ABNORMAL LOW (ref 4.14–5.80)
RDW: 12.7 % (ref 11.6–15.4)
WBC: 6.4 10*3/uL (ref 3.4–10.8)

## 2023-09-03 LAB — MAGNESIUM: Magnesium: 1.4 mg/dL — ABNORMAL LOW (ref 1.6–2.3)

## 2023-09-07 ENCOUNTER — Telehealth: Payer: Self-pay | Admitting: Cardiology

## 2023-09-07 NOTE — Telephone Encounter (Signed)
 Patient called to say that he needs a referral for at home therapy because he is unable to get to the office.   Also stated that he needs a prescription for a new wheelchair. Stated the one he has is more then 75 yrs old. Please advise

## 2023-09-07 NOTE — Telephone Encounter (Signed)
 Patient identification verified by 2 forms. Sims Duck, RN     Called and spoke to patient  Patient states:  - unable to get to appointment due to not having a wheelchair. (Pt next cardiac appt in April)  - has appt on 10/07/23 with PCP. Their office will provide transportation for this appointment               Interventions/Plan: - Chart review does not show that a referral for a wheel chair or home therapy was ordered by Dr. Alda Amas at last OV.  - Recommended patient f/u with PCP at appt in feb for these referrals.   Patient agrees with plan, no questions at this time

## 2023-09-08 NOTE — Telephone Encounter (Signed)
Agree with plan, he should follow-up with PCP for home health and wheelchair prescription

## 2023-09-14 ENCOUNTER — Encounter: Payer: Self-pay | Admitting: *Deleted

## 2023-09-14 ENCOUNTER — Other Ambulatory Visit: Payer: Self-pay | Admitting: *Deleted

## 2023-09-14 DIAGNOSIS — E785 Hyperlipidemia, unspecified: Secondary | ICD-10-CM

## 2023-09-14 DIAGNOSIS — I10 Essential (primary) hypertension: Secondary | ICD-10-CM

## 2023-09-14 MED ORDER — MAGNESIUM OXIDE 400 MG PO CAPS: 400.0000 mg | ORAL_CAPSULE | Freq: Every day | ORAL | Status: AC

## 2023-09-16 ENCOUNTER — Other Ambulatory Visit: Payer: Self-pay | Admitting: *Deleted

## 2023-09-16 ENCOUNTER — Encounter: Payer: Self-pay | Admitting: *Deleted

## 2023-09-16 DIAGNOSIS — E785 Hyperlipidemia, unspecified: Secondary | ICD-10-CM

## 2023-09-16 DIAGNOSIS — I10 Essential (primary) hypertension: Secondary | ICD-10-CM

## 2023-09-19 ENCOUNTER — Telehealth: Payer: Self-pay | Admitting: *Deleted

## 2023-09-19 NOTE — Telephone Encounter (Signed)
Called and spoke to patient. Made patient aware of lab results. Per Dr. Bjorn Pippin, kidney function has improved, mildly elevated sodium and magnesium is low.  Made patient aware that Dr. Bjorn Pippin recommended stay well hydrated and follow up with PCP for home health. Made patient aware that Dr. Bjorn Pippin recommendations to start Magnesium Oxide 400 mg daily and repeat labs drawn in 1-2 week for Bmet/ Mg in 1-2 weeks.Patient verbalized an understanding.

## 2023-10-10 ENCOUNTER — Telehealth: Payer: Self-pay | Admitting: *Deleted

## 2023-10-10 NOTE — Progress Notes (Signed)
 Complex Care Management Care Guide Note  10/10/2023 Name: [name] MRN: [id number] DOB: 04-22-1951  Taylor Myers is a 73 y.o. year old male who is a primary care patient of Tisovec, Adelfa Koh, MD and is actively engaged with the care management team. I reached out to Taylor Myers by phone today to assist with scheduling  with the RN Case Manager.  Follow up plan: Unsuccessful telephone outreach attempt made.   Gwenevere Ghazi  [facility]  [facility], [facility] Guide  Direct Dial: [phone]  Fax [phone] .

## 2023-10-24 NOTE — Progress Notes (Signed)
 Complex Care Management Care Guide Note  10/24/2023 Name: Taylor Myers MRN: [id number] DOB: 1951/03/25  Taylor Myers is a 73 y.o. year old male who is a primary care patient of Tisovec, Adelfa Koh, MD and is actively engaged with the care management team. I reached out to Taylor Myers by phone today to assist with re-scheduling  with the RN Case Manager.  Follow up plan: Unsuccessful telephone outreach attempt made. No further outreach attempts will be made at this time. We have been unable to contact the patient to reschedule for complex care management services.   Gwenevere Ghazi  [facility]  Value-Based Care Institute, [facility] Guide  Direct Dial: [phone]  Fax [phone]

## 2023-11-22 NOTE — Progress Notes (Deleted)
 Cardiology Office Note:    Date:  11/22/2023   ID:  Taylor Myers, DOB [birth year], MRN [id number]  PCP:  Gaspar Garbe, MD  Cardiologist:  Little Ishikawa, MD  Electrophysiologist:  None   Referring MD: Gaspar Garbe, MD   No chief complaint on file.   History of Present Illness:    Taylor Myers is a 73 y.o. male with a hx of atrial fibrillation, PAD, CVA, T2DM, hypertension, alcohol abuse who presents for follow-up.  He was admitted 06/2022 with altered mental status and falls.  Cardiology was consulted for atrial fibrillation.  He was noted on telemetry to be in A-fib with rates down to 30s to 40s and pauses up to 3 seconds.  Home metoprolol and digoxin were held with improvement in heart rate.  He was continued on Eliquis for anticoagulation.  Echocardiogram 07/13/2022 showed EF 55 to 60%, normal RV function, mild biatrial enlargement, no significant valvular disease.  Zio patch x 14 days on 08/17/2022 showed 100% A-fib burden, average rate 82 bpm, occasional PVCs (2.6% of beats), 17 episodes of NSVT with longest lasting 17 beats.  Since last clinic visit,  he has had multiple ED visits with falls (5 times in last month).  He was admitted with A-fib with RVR 06/2023, started on metoprolol 12.5 mg twice daily.  Denies any bleeding issues.  Currently out of all medications, off for 2 weeks.  States that he does currently have any alcohol in his house, has had none in last 4-5 days. Denies any chest pain, dyspnea, lightheadedness, syncope, lower extremity edema, or palpitations.   Past Medical History:  Diagnosis Date   A-fib (HCC)    Alcohol abuse, in remission    Arthritis    Asthma    child hood asthma   History of kidney stones    "dormant kidney stone"   Hypertension    Wheelchair dependent     Past Surgical History:  Procedure Laterality Date   ANKLE SURGERY Right    APPLICATION OF WOUND VAC Right 03/19/2022   Procedure: APPLICATION OF WOUND VAC;   Surgeon: Tarry Kos, MD;  Location: [facility] OR;  Service: Orthopedics;  Laterality: Right;   HIP SURGERY Right    total replacement   KNEE SURGERY     bilateral replacements   ORIF HUMERUS FRACTURE Right 03/19/2022   Procedure: OPEN REDUCTION INTERNAL FIXATION (ORIF) RIGHT DISTAL HUMERUS FRACTURE;  Surgeon: Tarry Kos, MD;  Location: [facility] OR;  Service: Orthopedics;  Laterality: Right;    Current Medications: No outpatient medications have been marked as taking for the 11/25/23 encounter (Appointment) with Little Ishikawa, MD.     Allergies:   Patient has no known allergies.   Social History   Socioeconomic History   Marital status: Divorced    Spouse name: Not on file   Number of children: Not on file   Years of education: Not on file   Highest education level: Not on file  Occupational History   Not on file  Tobacco Use   Smoking status: Former    Types: Cigarettes   Smokeless tobacco: Never  Vaping Use   Vaping status: Never Used  Substance and Sexual Activity   Alcohol use: Yes    Alcohol/week: 2.0 standard drinks of alcohol    Types: 2 Standard drinks or equivalent per week    Comment: occasionally   Drug use: Not Currently   Sexual activity: Not on file  Other Topics Concern  Not on file  Social History Narrative   Not on file   Social Drivers of Health   Financial Resource Strain: Not on file  Food Insecurity: No Food Insecurity (08/02/2023)   Hunger Vital Sign    Worried About Running Out of Food in the Last Year: Never true    Ran Out of Food in the Last Year: Never true  Transportation Needs: No Transportation Needs (08/02/2023)   PRAPARE - Administrator, Civil Service (Medical): No    Lack of Transportation (Non-Medical): No  Physical Activity: Inactive (08/02/2023)   Exercise Vital Sign    Days of Exercise per Week: 0 days    Minutes of Exercise per Session: 0 min  Stress: Not on file  Social Connections: Not on file     Family  History: The patient's family history is not on file.  ROS:   Please see the history of present illness.     All other systems reviewed and are negative.  EKGs/Labs/Other Studies Reviewed:    The following studies were reviewed today:   EKG:   09/02/23: Atrial fibrillation, right bundle branch block, rate 87 10/04/2022: Atrial fibrillation, PVCs, rate 83, right bundle branch block, left axis deviation, poor R wave progression, T wave inversions in leads V4-5  Recent Labs: 06/29/2023: TSH 1.665 09/02/2023: ALT 19; BUN 9; Creatinine, Ser 0.79; Hemoglobin 12.4; Magnesium 1.4; Platelets 189; Potassium 3.8; Sodium 146  Recent Lipid Panel    Component Value Date/Time   CHOL 153 10/04/2022 1019   TRIG 105 10/04/2022 1019   HDL 87 10/04/2022 1019   CHOLHDL 1.8 10/04/2022 1019   LDLCALC 48 10/04/2022 1019    Physical Exam:    VS:  There were no vitals taken for this visit.    Wt Readings from Last 3 Encounters:  09/02/23 166 lb (75.3 kg)  08/15/23 154 lb 5.2 oz (70 kg)  08/09/23 154 lb 5.2 oz (70 kg)     GEN:  Well nourished, well developed in no acute distress HEENT: Normal NECK: No JVD; No carotid bruits CARDIAC: Irregular, normal rate, no murmurs, rubs, gallops RESPIRATORY:  Clear to auscultation without rales, wheezing or rhonchi  ABDOMEN: Soft, non-tender, non-distended MUSCULOSKELETAL:  No edema; No deformity  SKIN: Warm and dry NEUROLOGIC:  Alert and oriented x 3 PSYCHIATRIC:  Normal affect   ASSESSMENT:    No diagnosis found.   PLAN:    Atrial fibrillation: Likely permanent Afib, reports has been in Afib for years.  During admission 06/2022 noted to have A-fib with significant bradycardia down to heart rate 30s to 40s and pauses up to 3 seconds.  Home metoprolol digoxin were discontinued.  Echocardiogram 07/13/2022 showed EF 55 to 60%, normal RV function, mild biatrial enlargement, no significant valvular disease.  Zio patch x 14 days on 08/17/2022 showed 100%  A-fib burden, average rate 82 bpm, occasional PVCs (2.6% of beats), 17 episodes of NSVT with longest lasting 17 beats. -Rates appear controlled off AV nodal blockers -He has been on Eliquis 5 mg BID.  Complicated situation as would recommend Eliquis given his stroke risk from Afib (CHADS-VASc 5: hypertension, age, CVA, vascular disease) but he has had recurrent falls due to alcohol abuse.  We discussed risks and benefits of anticoagulation.  He states that he currently has no alcohol in his house and believes he can stay away from alcohol and would like to continue Eliquis.  Will refill Eliquis 5 mg BID  NSVT: 17 episodes of NSVT  with longest lasting 17 beats on Zio patch 08/17/2022.  Appears irregular rhythm during episodes, suspect A-fib with aberrancy.    Hypertension: on amlodipine 5 mg daily  Hyperlipidemia: On atorvastatin 40 mg daily.  LDL 70 on 01/20/2023.  He has been off atorvastatin, will restart  PAD: Reported history, continue Eliquis and atorvastatin.  Normal ABIs 09/2022  OSA: Severe OSA on sleep study 09/2022.  Did not start CPAP.  He is agreeable to seeing sleep medicine, will refer***  Leg weakness: requests referral to PT, will refer***   RTC in 3 months***   Medication Adjustments/Labs and Tests Ordered: Current medicines are reviewed at length with the patient today.  Concerns regarding medicines are outlined above.  No orders of the defined types were placed in this encounter.  No orders of the defined types were placed in this encounter.   There are no Patient Instructions on file for this visit.   Signed, Little Ishikawa, MD  11/22/2023 10:06 PM    [facility]

## 2023-11-25 ENCOUNTER — Ambulatory Visit: Payer: Medicare Other | Attending: Cardiology | Admitting: Cardiology

## 2023-12-12 ENCOUNTER — Emergency Department (HOSPITAL_BASED_OUTPATIENT_CLINIC_OR_DEPARTMENT_OTHER)

## 2023-12-12 ENCOUNTER — Other Ambulatory Visit: Payer: Self-pay

## 2023-12-12 ENCOUNTER — Encounter (HOSPITAL_BASED_OUTPATIENT_CLINIC_OR_DEPARTMENT_OTHER): Payer: Self-pay

## 2023-12-12 ENCOUNTER — Emergency Department (HOSPITAL_BASED_OUTPATIENT_CLINIC_OR_DEPARTMENT_OTHER)
Admission: EM | Admit: 2023-12-12 | Discharge: 2023-12-13 | Disposition: A | Discharge status: Home / Self Care | Source: Home / Self Care | Attending: Emergency Medicine | Admitting: Emergency Medicine

## 2023-12-12 DIAGNOSIS — B379 Candidiasis, unspecified: Secondary | ICD-10-CM | POA: Insufficient documentation

## 2023-12-12 DIAGNOSIS — L03317 Cellulitis of buttock: Secondary | ICD-10-CM | POA: Insufficient documentation

## 2023-12-12 DIAGNOSIS — Z7901 Long term (current) use of anticoagulants: Secondary | ICD-10-CM | POA: Insufficient documentation

## 2023-12-12 DIAGNOSIS — L03314 Cellulitis of groin: Secondary | ICD-10-CM | POA: Diagnosis not present

## 2023-12-12 DIAGNOSIS — R7881 Bacteremia: Secondary | ICD-10-CM | POA: Diagnosis not present

## 2023-12-12 LAB — CBC WITH DIFFERENTIAL/PLATELET
Abs Immature Granulocytes: 0.02 10*3/uL (ref 0.00–0.07)
Basophils Absolute: 0 10*3/uL (ref 0.0–0.1)
Basophils Relative: 0 %
Eosinophils Absolute: 0 10*3/uL (ref 0.0–0.5)
Eosinophils Relative: 0 %
HCT: 38.7 % — ABNORMAL LOW (ref 39.0–52.0)
Hemoglobin: 13.4 g/dL (ref 13.0–17.0)
Immature Granulocytes: 0 %
Lymphocytes Relative: 7 %
Lymphs Abs: 0.6 10*3/uL — ABNORMAL LOW (ref 0.7–4.0)
MCH: 32.7 pg (ref 26.0–34.0)
MCHC: 34.6 g/dL (ref 30.0–36.0)
MCV: 94.4 fL (ref 80.0–100.0)
Monocytes Absolute: 0.6 10*3/uL (ref 0.1–1.0)
Monocytes Relative: 7 %
Neutro Abs: 7.2 10*3/uL (ref 1.7–7.7)
Neutrophils Relative %: 86 %
Platelets: 110 10*3/uL — ABNORMAL LOW (ref 150–400)
RBC: 4.1 MIL/uL — ABNORMAL LOW (ref 4.22–5.81)
RDW: 13.1 % (ref 11.5–15.5)
WBC: 8.4 10*3/uL (ref 4.0–10.5)
nRBC: 0 % (ref 0.0–0.2)

## 2023-12-12 LAB — COMPREHENSIVE METABOLIC PANEL WITH GFR
ALT: 11 U/L (ref 0–44)
AST: 22 U/L (ref 15–41)
Albumin: 3.8 g/dL (ref 3.5–5.0)
Alkaline Phosphatase: 71 U/L (ref 38–126)
Anion gap: 11 (ref 5–15)
BUN: 9 mg/dL (ref 8–23)
CO2: 30 mmol/L (ref 22–32)
Calcium: 8.6 mg/dL — ABNORMAL LOW (ref 8.9–10.3)
Chloride: 98 mmol/L (ref 98–111)
Creatinine, Ser: 0.77 mg/dL (ref 0.61–1.24)
GFR, Estimated: >60 mL/min (ref >60)
Glucose, Bld: 104 mg/dL — ABNORMAL HIGH (ref 70–99)
Potassium: 3.2 mmol/L — ABNORMAL LOW (ref 3.5–5.1)
Sodium: 139 mmol/L (ref 135–145)
Total Bilirubin: 3.6 mg/dL — ABNORMAL HIGH (ref 0.0–1.2)
Total Protein: 6.5 g/dL (ref 6.5–8.1)

## 2023-12-12 LAB — LACTIC ACID, PLASMA: Lactic Acid, Venous: 2.1 mmol/L (ref 0.5–1.9)

## 2023-12-12 MED ORDER — POTASSIUM CHLORIDE CRYS ER 20 MEQ PO TBCR
40.0000 meq | EXTENDED_RELEASE_TABLET | Freq: Once | ORAL | Status: AC
  Administered 2023-12-12: 40 meq via ORAL
  Filled 2023-12-12: qty 2

## 2023-12-12 MED ORDER — NYSTATIN 100000 UNIT/GM EX CREA: TOPICAL_CREAM | CUTANEOUS | 0 refills | Status: AC

## 2023-12-12 MED ORDER — LACTATED RINGERS IV BOLUS
1000.0000 mL | Freq: Once | INTRAVENOUS | Status: AC
  Administered 2023-12-12: 1000 mL via INTRAVENOUS

## 2023-12-12 MED ORDER — DOXYCYCLINE HYCLATE 100 MG PO CAPS: 100.0000 mg | ORAL_CAPSULE | Freq: Two times a day (BID) | ORAL | 0 refills | Status: DC

## 2023-12-12 MED ORDER — NYSTATIN 100000 UNIT/GM EX POWD
Freq: Once | CUTANEOUS | Status: AC
  Filled 2023-12-12: qty 15

## 2023-12-12 MED ORDER — IOHEXOL 300 MG/ML  SOLN
100.0000 mL | Freq: Once | INTRAMUSCULAR | Status: AC | PRN
  Administered 2023-12-12: 100 mL via INTRAVENOUS

## 2023-12-12 MED ORDER — DOXYCYCLINE HYCLATE 100 MG PO TABS
100.0000 mg | ORAL_TABLET | Freq: Once | ORAL | Status: AC
Start: 2023-12-12 — End: 2023-12-12
  Administered 2023-12-12: 100 mg via ORAL
  Filled 2023-12-12: qty 1

## 2023-12-12 NOTE — Discharge Instructions (Addendum)
 I have sent you a prescription for nystatin .  This is a cream that you can apply to the area where you have redness and pain.  You can also take an antibiotic called doxycycline  once in the morning once in the evening for the next 10 days.  Return to the emergency room if you develop any fever, noticed any pain, or redness affecting your scrotum.  Please follow-up with your primary care doctor within 3 to 5 days for reassessment.

## 2023-12-12 NOTE — ED Provider Notes (Signed)
 [place] EMERGENCY DEPARTMENT AT [facility] Provider Note   CSN: [id number] Arrival date & time: 12/12/23  1709     History  Chief Complaint  Patient presents with   Rash    Taylor Myers is a 73 y.o. male.  This is a 73 year old male who is presenting to the emergency room today due to a rash in his groin.  Has been ongoing for about 3 days.  He says that he has been using zinc oxide and Vaseline without relief.  Patient does not have a history of diabetes.  Patient is that he noted a smell to the area today.  He has not had fever or chills.  No difficulty with urination.   Rash      Home Medications Prior to Admission medications   Medication Sig Start Date End Date Taking? Authorizing Provider  doxycycline  (VIBRAMYCIN ) 100 MG capsule Take 1 capsule (100 mg total) by mouth 2 (two) times daily. 12/12/23  Yes Afton Horse T, DO  nystatin  cream (MYCOSTATIN ) Apply to affected area 2 times daily 12/12/23  Yes Afton Horse T, DO  amLODipine  (NORVASC ) 5 MG tablet Take 1 tablet (5 mg total) by mouth daily. 09/02/23 12/01/23  Wendie Hamburg, MD  apixaban  (ELIQUIS ) 5 MG TABS tablet Take 1 tablet (5 mg total) by mouth 2 (two) times daily. 09/02/23   Wendie Hamburg, MD  atorvastatin  (LIPITOR) 40 MG tablet Take 1 tablet (40 mg total) by mouth daily. 09/02/23 12/01/23  Wendie Hamburg, MD  DHA-EPA-Flaxseed Oil-Vitamin E [facility] NUTRITION) CAPS Take 1 capsule by mouth daily.    [provider]  ibuprofen (ADVIL) 600 MG tablet Take 600 mg by mouth every 6 (six) hours as needed for moderate pain (pain score 4-6).    [provider]  Magnesium  Oxide 400 MG CAPS Take 1 capsule (400 mg total) by mouth daily. 09/14/23   Wendie Hamburg, MD  Multiple Vitamins-Minerals (LIVER DETOX PO) Take 2 tablets by mouth daily.    [provider]  NON FORMULARY Take 2 capsules by mouth See admin instructions. Beet extract capsules- Take 2  capsules by mouth once a day    [provider]  tamsulosin  (FLOMAX ) 0.4 MG CAPS capsule Take 0.4 mg by mouth daily.    [provider]      Allergies    Patient has no known allergies.    Review of Systems   Review of Systems  Skin:  Positive for rash.    Physical Exam Updated Vital Signs BP 125/89   Pulse 62   Temp 98.6 F (37 C)   Resp 18   Ht 5\' 9"  (1.753 m)   Wt 74.8 kg   SpO2 98%   BMI 24.37 kg/m  Physical Exam Vitals reviewed.  HENT:     Head: Normocephalic and atraumatic.  Abdominal:     General: Abdomen is flat.     Palpations: Abdomen is soft.  Genitourinary:    Penis: Normal.      Testes: Normal.     Comments: No scrotal erythema, no swelling.  Normal external genitalia Musculoskeletal:        General: Normal range of motion.     Cervical back: Normal range of motion.  Skin:    Comments: Patient with 2 areas of skin breakdown.  The first being on the right side of the pannus, there is redness, erythema, small amount of skin breakdown.  No surrounding crepitus, no blistering.  Second area  on the patient's buttocks, not the perineum, there is also some erythema, skin breakdown.  Neurological:     Mental Status: He is alert.     ED Results / Procedures / Treatments   Labs (all labs ordered are listed, but only abnormal results are displayed) Labs Reviewed  CBC WITH DIFFERENTIAL/PLATELET - Abnormal; Notable for the following components:      Result Value   RBC 4.10 (*)    HCT 38.7 (*)    Platelets 110 (*)    Lymphs Abs 0.6 (*)    All other components within normal limits  COMPREHENSIVE METABOLIC PANEL WITH GFR - Abnormal; Notable for the following components:   Potassium 3.2 (*)    Glucose, Bld 104 (*)    Calcium  8.6 (*)    Total Bilirubin 3.6 (*)    All other components within normal limits  LACTIC ACID, PLASMA - Abnormal; Notable for the following components:   Lactic Acid, Venous 2.1 (*)    All other components within  normal limits  CULTURE, BLOOD (ROUTINE X 2)  CULTURE, BLOOD (ROUTINE X 2)  LACTIC ACID, PLASMA    EKG None  Radiology No results found.  Procedures Procedures    Medications Ordered in ED Medications  doxycycline  (VIBRA -TABS) tablet 100 mg (has no administration in time range)  nystatin  (MYCOSTATIN /NYSTOP ) topical powder (has no administration in time range)  lactated ringers  bolus 1,000 mL (0 mLs Intravenous Stopped 12/12/23 2146)  iohexol  (OMNIPAQUE ) 300 MG/ML solution 100 mL (100 mLs Intravenous Contrast Given 12/12/23 2237)  potassium chloride  SA (KLOR-CON  M) CR tablet 40 mEq (40 mEq Oral Given 12/12/23 2325)    ED Course/ Medical Decision Making/ A&P                                 Medical Decision Making This is a 73 year old male who is here today for the rash in his groin that is spread on his side.  Differential diagnoses for this patient include candidiasis, cellulitis, less likely for years, less likely necrotizing soft tissue infection.  Plan-patient nontoxic.  He has a normal heart rate, afebrile, normotensive.  His skin exam is most consistent with candidiasis, occurring in the fold of the right pannus, and on his buttocks.  There is no involvement of the perineum, scrotum.  Will check basic blood work on the patient including lactic acid.  Out of an abundance of caution, will obtain imaging of the patient's abdomen and pelvis to ensure that there is no air tracking.  Patient overall appears to be in poor health, however does not appear to be acutely ill.  Patient signed out to Dr. Bolivar Bushman pending CT imaging.  Amount and/or Complexity of Data Reviewed Labs: ordered. Radiology: ordered.  Risk Prescription drug management.           Final Clinical Impression(s) / ED Diagnoses Final diagnoses:  Candidiasis  Cellulitis of buttock    Rx / DC Orders ED Discharge Orders          Ordered    nystatin  cream (MYCOSTATIN )        12/12/23 2328     doxycycline  (VIBRAMYCIN ) 100 MG capsule  2 times daily        12/12/23 2328              Afton Horse T, DO 12/12/23 2330

## 2023-12-12 NOTE — ED Notes (Signed)
 Patient transported to CT

## 2023-12-12 NOTE — ED Triage Notes (Signed)
 Pt arrived via [facility] from home c/o rash in groin and lower abd x3 days.   EMS VS  BP 120/84 HR 94 SpO2 98% RA

## 2023-12-12 NOTE — ED Triage Notes (Signed)
 In for eval of rash to groin that his spread around his right side to his back. Used Vaseline and Zine oxide without relief.

## 2023-12-13 LAB — LACTIC ACID, PLASMA: Lactic Acid, Venous: 1.6 mmol/L (ref 0.5–1.9)

## 2023-12-13 NOTE — ED Notes (Addendum)
-  Called Non-Emergency for [facility] transportation at 221am. Patient is 2nd on list.

## 2023-12-13 NOTE — ED Provider Notes (Signed)
 Care assumed at shift change. Here for groin rash, concern for candida vs cellulitis vs decubitus. Awaiting CT to rule out forniere's.  Physical Exam  BP 127/89   Pulse 66   Temp 99.1 F (37.3 C) (Oral)   Resp 18   Ht 5\' 9"  (1.753 m)   Wt 74.8 kg   SpO2 94%   BMI 24.37 kg/m   Physical Exam  Procedures  Procedures  ED Course / MDM   Clinical Course as of 12/13/23 0221  Tue Dec 13, 2023  0220 I personally viewed the images from radiology studies and agree with radiologist interpretation: CT negative for deep infection. Consistent with cellulitis/decubitus. Patient will be discharged with Rx for doxycycline , recommend PCP follow up, RTED for any other concerns.   [CS]    Clinical Course User Index [CS] Charmayne Cooper, MD   Medical Decision Making Amount and/or Complexity of Data Reviewed Labs: ordered. Radiology: ordered.  Risk Prescription drug management.          Charmayne Cooper, MD 12/13/23 [phone]

## 2023-12-14 ENCOUNTER — Encounter (HOSPITAL_BASED_OUTPATIENT_CLINIC_OR_DEPARTMENT_OTHER): Payer: Self-pay

## 2023-12-14 ENCOUNTER — Inpatient Hospital Stay (HOSPITAL_BASED_OUTPATIENT_CLINIC_OR_DEPARTMENT_OTHER)
Admission: EM | Admit: 2023-12-14 | Discharge: 2023-12-17 | DRG: 603 | Disposition: A | Discharge status: Home / Self Care | Attending: Internal Medicine | Admitting: Internal Medicine

## 2023-12-14 ENCOUNTER — Other Ambulatory Visit: Payer: Self-pay

## 2023-12-14 DIAGNOSIS — B372 Candidiasis of skin and nail: Secondary | ICD-10-CM | POA: Diagnosis present

## 2023-12-14 DIAGNOSIS — L03317 Cellulitis of buttock: Secondary | ICD-10-CM | POA: Diagnosis present

## 2023-12-14 DIAGNOSIS — L03314 Cellulitis of groin: Secondary | ICD-10-CM | POA: Diagnosis present

## 2023-12-14 DIAGNOSIS — B958 Unspecified staphylococcus as the cause of diseases classified elsewhere: Secondary | ICD-10-CM | POA: Diagnosis present

## 2023-12-14 DIAGNOSIS — I10 Essential (primary) hypertension: Secondary | ICD-10-CM | POA: Diagnosis present

## 2023-12-14 DIAGNOSIS — L304 Erythema intertrigo: Secondary | ICD-10-CM | POA: Diagnosis present

## 2023-12-14 DIAGNOSIS — R5381 Other malaise: Secondary | ICD-10-CM | POA: Diagnosis present

## 2023-12-14 DIAGNOSIS — L03311 Cellulitis of abdominal wall: Secondary | ICD-10-CM | POA: Diagnosis present

## 2023-12-14 DIAGNOSIS — D696 Thrombocytopenia, unspecified: Secondary | ICD-10-CM | POA: Diagnosis present

## 2023-12-14 DIAGNOSIS — Z87891 Personal history of nicotine dependence: Secondary | ICD-10-CM | POA: Diagnosis not present

## 2023-12-14 DIAGNOSIS — Z79899 Other long term (current) drug therapy: Secondary | ICD-10-CM | POA: Diagnosis not present

## 2023-12-14 DIAGNOSIS — J45909 Unspecified asthma, uncomplicated: Secondary | ICD-10-CM | POA: Diagnosis present

## 2023-12-14 DIAGNOSIS — Z7901 Long term (current) use of anticoagulants: Secondary | ICD-10-CM

## 2023-12-14 DIAGNOSIS — Z87442 Personal history of urinary calculi: Secondary | ICD-10-CM

## 2023-12-14 DIAGNOSIS — R7881 Bacteremia: Secondary | ICD-10-CM | POA: Diagnosis present

## 2023-12-14 DIAGNOSIS — M109 Gout, unspecified: Secondary | ICD-10-CM | POA: Diagnosis present

## 2023-12-14 DIAGNOSIS — E785 Hyperlipidemia, unspecified: Secondary | ICD-10-CM | POA: Diagnosis present

## 2023-12-14 DIAGNOSIS — Z96642 Presence of left artificial hip joint: Secondary | ICD-10-CM | POA: Diagnosis present

## 2023-12-14 DIAGNOSIS — B9689 Other specified bacterial agents as the cause of diseases classified elsewhere: Secondary | ICD-10-CM | POA: Diagnosis present

## 2023-12-14 DIAGNOSIS — Z993 Dependence on wheelchair: Secondary | ICD-10-CM

## 2023-12-14 DIAGNOSIS — E876 Hypokalemia: Secondary | ICD-10-CM | POA: Diagnosis present

## 2023-12-14 DIAGNOSIS — G500 Trigeminal neuralgia: Secondary | ICD-10-CM | POA: Diagnosis present

## 2023-12-14 DIAGNOSIS — I482 Chronic atrial fibrillation, unspecified: Secondary | ICD-10-CM | POA: Diagnosis present

## 2023-12-14 DIAGNOSIS — F1011 Alcohol abuse, in remission: Secondary | ICD-10-CM | POA: Diagnosis present

## 2023-12-14 DIAGNOSIS — H5461 Unqualified visual loss, right eye, normal vision left eye: Secondary | ICD-10-CM | POA: Diagnosis present

## 2023-12-14 DIAGNOSIS — R5383 Other fatigue: Secondary | ICD-10-CM | POA: Diagnosis present

## 2023-12-14 DIAGNOSIS — Z91148 Patient's other noncompliance with medication regimen for other reason: Secondary | ICD-10-CM

## 2023-12-14 LAB — CBC WITH DIFFERENTIAL/PLATELET
Abs Immature Granulocytes: 0.03 10*3/uL (ref 0.00–0.07)
Basophils Absolute: 0 10*3/uL (ref 0.0–0.1)
Basophils Relative: 0 %
Eosinophils Absolute: 0 10*3/uL (ref 0.0–0.5)
Eosinophils Relative: 1 %
HCT: 37.6 % — ABNORMAL LOW (ref 39.0–52.0)
Hemoglobin: 12.9 g/dL — ABNORMAL LOW (ref 13.0–17.0)
Immature Granulocytes: 1 %
Lymphocytes Relative: 13 %
Lymphs Abs: 0.9 10*3/uL (ref 0.7–4.0)
MCH: 32.7 pg (ref 26.0–34.0)
MCHC: 34.3 g/dL (ref 30.0–36.0)
MCV: 95.4 fL (ref 80.0–100.0)
Monocytes Absolute: 0.5 10*3/uL (ref 0.1–1.0)
Monocytes Relative: 7 %
Neutro Abs: 5.2 10*3/uL (ref 1.7–7.7)
Neutrophils Relative %: 78 %
Platelets: 100 10*3/uL — ABNORMAL LOW (ref 150–400)
RBC: 3.94 MIL/uL — ABNORMAL LOW (ref 4.22–5.81)
RDW: 13.2 % (ref 11.5–15.5)
WBC: 6.6 10*3/uL (ref 4.0–10.5)
nRBC: 0 % (ref 0.0–0.2)

## 2023-12-14 LAB — COMPREHENSIVE METABOLIC PANEL WITH GFR
ALT: 14 U/L (ref 0–44)
AST: 35 U/L (ref 15–41)
Albumin: 3.9 g/dL (ref 3.5–5.0)
Alkaline Phosphatase: 75 U/L (ref 38–126)
Anion gap: 15 (ref 5–15)
BUN: 16 mg/dL (ref 8–23)
CO2: 26 mmol/L (ref 22–32)
Calcium: 8.9 mg/dL (ref 8.9–10.3)
Chloride: 99 mmol/L (ref 98–111)
Creatinine, Ser: 0.92 mg/dL (ref 0.61–1.24)
GFR, Estimated: >60 mL/min (ref >60)
Glucose, Bld: 96 mg/dL (ref 70–99)
Potassium: 3.4 mmol/L — ABNORMAL LOW (ref 3.5–5.1)
Sodium: 140 mmol/L (ref 135–145)
Total Bilirubin: 1.7 mg/dL — ABNORMAL HIGH (ref 0.0–1.2)
Total Protein: 6.3 g/dL — ABNORMAL LOW (ref 6.5–8.1)

## 2023-12-14 LAB — BLOOD CULTURE ID PANEL (REFLEXED) - BCID2

## 2023-12-14 LAB — LACTIC ACID, PLASMA
Lactic Acid, Venous: 1.6 mmol/L (ref 0.5–1.9)
Lactic Acid, Venous: 1.7 mmol/L (ref 0.5–1.9)

## 2023-12-14 MED ORDER — APIXABAN 5 MG PO TABS
5.0000 mg | ORAL_TABLET | Freq: Two times a day (BID) | ORAL | Status: DC
  Administered 2023-12-14 – 2023-12-17 (×6): 5 mg via ORAL
  Filled 2023-12-14 (×6): qty 1

## 2023-12-14 MED ORDER — AMLODIPINE BESYLATE 5 MG PO TABS
5.0000 mg | ORAL_TABLET | Freq: Every day | ORAL | Status: DC
  Administered 2023-12-14 – 2023-12-17 (×3): 5 mg via ORAL
  Filled 2023-12-14 (×3): qty 1

## 2023-12-14 MED ORDER — VANCOMYCIN HCL IN DEXTROSE 1-5 GM/200ML-% IV SOLN
1000.0000 mg | Freq: Once | INTRAVENOUS | Status: AC
  Administered 2023-12-14: 1000 mg via INTRAVENOUS
  Filled 2023-12-14: qty 200

## 2023-12-14 MED ORDER — SODIUM CHLORIDE 0.9 % IV SOLN: 2.0000 g | Freq: Two times a day (BID) | INTRAVENOUS | Status: DC

## 2023-12-14 MED ORDER — THIAMINE MONONITRATE 100 MG PO TABS
100.0000 mg | ORAL_TABLET | Freq: Every day | ORAL | Status: DC
  Administered 2023-12-14 – 2023-12-17 (×4): 100 mg via ORAL
  Filled 2023-12-14 (×4): qty 1

## 2023-12-14 MED ORDER — FOLIC ACID 1 MG PO TABS
1.0000 mg | ORAL_TABLET | Freq: Every day | ORAL | Status: DC
  Administered 2023-12-14 – 2023-12-17 (×4): 1 mg via ORAL
  Filled 2023-12-14 (×4): qty 1

## 2023-12-14 MED ORDER — POTASSIUM CHLORIDE CRYS ER 20 MEQ PO TBCR
40.0000 meq | EXTENDED_RELEASE_TABLET | Freq: Once | ORAL | Status: AC
  Administered 2023-12-14: 40 meq via ORAL

## 2023-12-14 MED ORDER — VANCOMYCIN HCL 500 MG IV SOLR
500.0000 mg | Freq: Once | INTRAVENOUS | Status: AC
  Administered 2023-12-14: 500 mg via INTRAVENOUS

## 2023-12-14 MED ORDER — TAMSULOSIN HCL 0.4 MG PO CAPS
0.4000 mg | ORAL_CAPSULE | Freq: Every day | ORAL | Status: DC
Start: 2023-12-14 — End: 2023-12-17
  Administered 2023-12-14 – 2023-12-16 (×3): 0.4 mg via ORAL
  Filled 2023-12-14 (×3): qty 1

## 2023-12-14 MED ORDER — ADULT MULTIVITAMIN W/MINERALS CH
1.0000 | ORAL_TABLET | Freq: Every day | ORAL | Status: DC
  Administered 2023-12-14 – 2023-12-17 (×4): 1 via ORAL
  Filled 2023-12-14 (×4): qty 1

## 2023-12-14 MED ORDER — ATORVASTATIN CALCIUM 40 MG PO TABS
40.0000 mg | ORAL_TABLET | Freq: Every day | ORAL | Status: DC
  Administered 2023-12-14 – 2023-12-17 (×4): 40 mg via ORAL
  Filled 2023-12-14 (×4): qty 1

## 2023-12-14 MED ORDER — VANCOMYCIN HCL 1500 MG/300ML IV SOLN
1500.0000 mg | INTRAVENOUS | Status: DC
  Administered 2023-12-15 – 2023-12-16 (×2): 1500 mg via INTRAVENOUS
  Filled 2023-12-14 (×3): qty 300

## 2023-12-14 MED ORDER — SODIUM CHLORIDE 0.9 % IV SOLN
2.0000 g | Freq: Three times a day (TID) | INTRAVENOUS | Status: DC
  Administered 2023-12-14 – 2023-12-15 (×2): 2 g via INTRAVENOUS
  Filled 2023-12-14 (×4): qty 12.5

## 2023-12-14 MED ORDER — NYSTATIN 100000 UNIT/GM EX POWD
Freq: Two times a day (BID) | CUTANEOUS | Status: DC
  Filled 2023-12-14: qty 15

## 2023-12-14 NOTE — ED Provider Notes (Signed)
 [place] EMERGENCY DEPARTMENT AT [facility] Provider Note   CSN: [id number] Arrival date & time: 12/14/23  2952     History  Chief Complaint  Patient presents with   Abnormal Lab    Taylor Myers is a 73 y.o. male.  This is a 73 year old gentleman with a significant past medical history of wheelchair dependence, gout, asthma, medication noncompliance, A-fib, , and alcohol  use who was recently seen in the emergency department for a rash on his groin.  Workup at that time was most consistent with candidiasis, versus cellulitis, versus decubitus ulcer.  CT was negative for possible Fournier's gangrene.  At that time his lactic acid was trivially elevated, blood cultures were obtained.  He was discharged home with doxycycline  and nystatin .  Following his discharge, blood cultures resulted positive for staph species and the patient was called to return to the ED.  In speaking with the patient today, he states that he has had some increased fatigue and malaise over the last several days.  He does also endorse poor p.o. and anorexia prior to his ED visit which is not normal for him.  He was able to take his antibiotics for the first time last night, and received a call this morning telling him to return to the ED.  He denies fevers, chills, night sweats, nausea, vomiting, diarrhea at this time.  He says that the affected area was deviously more erythematous and painful which has improved since his previous ED visit.  He lives at home with his 31 year old father who suffers from dementing illness.  He is wheelchair-bound at baseline and his home is set up to be handicap accessible.  He is usually able to transfer from the wheelchair and perform most of his ADLs himself, but notes increased difficulty with doing this as of recent.  He states he drinks 5-6 drinks a week but quit about 1 week ago.  He denies history of alcohol  withdrawal in the past.   The history is provided by the  patient.  Abnormal Lab      Home Medications Prior to Admission medications   Medication Sig Start Date End Date Taking? Authorizing Provider  amLODipine  (NORVASC ) 5 MG tablet Take 1 tablet (5 mg total) by mouth daily. 09/02/23 12/01/23  Wendie Hamburg, MD  apixaban  (ELIQUIS ) 5 MG TABS tablet Take 1 tablet (5 mg total) by mouth 2 (two) times daily. 09/02/23   Wendie Hamburg, MD  atorvastatin  (LIPITOR) 40 MG tablet Take 1 tablet (40 mg total) by mouth daily. 09/02/23 12/01/23  Wendie Hamburg, MD  DHA-EPA-Flaxseed Oil-Vitamin E [facility] NUTRITION) CAPS Take 1 capsule by mouth daily.    [provider]  doxycycline  (VIBRAMYCIN ) 100 MG capsule Take 1 capsule (100 mg total) by mouth 2 (two) times daily. 12/12/23   Afton Horse T, DO  ibuprofen (ADVIL) 600 MG tablet Take 600 mg by mouth every 6 (six) hours as needed for moderate pain (pain score 4-6).    [provider]  Magnesium  Oxide 400 MG CAPS Take 1 capsule (400 mg total) by mouth daily. 09/14/23   Wendie Hamburg, MD  Multiple Vitamins-Minerals (LIVER DETOX PO) Take 2 tablets by mouth daily.    [provider]  NON FORMULARY Take 2 capsules by mouth See admin instructions. Beet extract capsules- Take 2 capsules by mouth once a day    [provider]  nystatin  cream (MYCOSTATIN ) Apply to affected area 2 times daily 12/12/23   Afton Horse  T, DO  tamsulosin  (FLOMAX ) 0.4 MG CAPS capsule Take 0.4 mg by mouth daily.    [provider]      Allergies    Patient has no known allergies.    Review of Systems   Review of Systems  Constitutional:  Positive for activity change, appetite change and fatigue. Negative for chills and fever.  Eyes:  Positive for pain and discharge.  Respiratory:  Negative for shortness of breath.   Gastrointestinal:  Negative for abdominal pain, constipation, diarrhea, nausea and vomiting.    Physical Exam Updated Vital Signs BP  106/86   Pulse 96   Temp 98.9 F (37.2 C)   Resp 20   SpO2 96%  Physical Exam Constitutional:      Comments: Chronically ill-appearing.  HENT:     Head:     Comments: Disconjugate right eye gaze, right discharge appreciated without significant underlying erythema.  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 Cardiovascular:     Rate and Rhythm: Rhythm irregular.  Pulmonary:     Effort: Pulmonary effort is normal.     Breath sounds: Normal breath sounds.  Abdominal:     General: Abdomen is flat.     Tenderness: There is no abdominal tenderness.  Genitourinary:    Pubic Area: Rash present.     Comments: White scaly rash appreciated in the skin folds.  Minimal erythema/skin breakdown,    ED Results / Procedures / Treatments   Labs (all labs ordered are listed, but only abnormal results are displayed) Labs Reviewed  CBC WITH DIFFERENTIAL/PLATELET - Abnormal; Notable for the following components:      Result Value   RBC 3.94 (*)    Hemoglobin 12.9 (*)    HCT 37.6 (*)    Platelets 100 (*)    All other components within normal limits  COMPREHENSIVE METABOLIC PANEL WITH GFR - Abnormal; Notable for the following components:   Potassium 3.4 (*)    Total Protein 6.3 (*)    Total Bilirubin 1.7 (*)    All other components within normal limits  CULTURE, BLOOD (ROUTINE X 2)  CULTURE, BLOOD (ROUTINE X 2)  LACTIC ACID, PLASMA  LACTIC ACID, PLASMA    EKG None  Radiology CT ABDOMEN PELVIS W CONTRAST Result Date: 12/13/2023 CLINICAL DATA:  Acute nonlocalized abdominal pain. Perirectal abscess. Rash in groin and lower abdomen for 3 days. EXAM: CT ABDOMEN AND PELVIS WITH CONTRAST TECHNIQUE: Multidetector CT imaging of the abdomen and pelvis was performed using the standard protocol following bolus administration of intravenous contrast. RADIATION DOSE REDUCTION: This exam was performed according to the departmental dose-optimization program which includes automated exposure  control, adjustment of the mA and/or kV according to patient size and/or use of iterative reconstruction technique. CONTRAST:  OMNIPAQUE  IOHEXOL  300 MG/ML  SOLN COMPARISON:  CT abdomen pelvis 05/19/2021 FINDINGS: Lower chest: No acute abnormality. Cardiomegaly. Coronary artery atherosclerotic calcification. Hepatobiliary: Marked hepatic steatosis. Gallbladder and biliary tree are unremarkable. Pancreas: Atrophic.  No acute abnormality. Spleen: Unremarkable. Adrenals/Urinary Tract: Normal adrenal glands. Bilateral cortical renal scarring. Large cyst in the right kidney. No follow-up recommended. Nonobstructing right renal stone. No ureteral calculi or hydronephrosis. Unremarkable bladder. Stomach/Bowel: Normal caliber large and small bowel. No bowel wall thickening. Appendix is not visualized. No secondary signs of appendicitis. Stomach is within normal limits. No perirectal fluid collection or stranding. Vascular/Lymphatic: Aortic atherosclerosis. No enlarged abdominal or pelvic lymph nodes. Reproductive: Unremarkable. Other: No free intraperitoneal fluid or air. Musculoskeletal: No acute fracture. Right  THA. Chronic compression fractures of T11, L4, and L5. Soft tissue thickening and mild stranding in the posterior right gluteal soft tissues posterior to the right ischial tuberosity. No evidence of osteomyelitis. IMPRESSION: 1. No acute abnormality in the abdomen or pelvis. 2. Soft tissue thickening and mild stranding in the posterior right gluteal soft tissues posterior to the right ischial tuberosity. Question developing ischial decubitus ulcer. 3. Marked hepatic steatosis. 4. Nonobstructing right renal stone. 5. Aortic Atherosclerosis (ICD10-I70.0). Electronically Signed   By: Rozell Cornet M.D.   On: 12/13/2023 02:06    Procedures Procedures    Medications Ordered in ED Medications  vancomycin  (VANCOCIN ) IVPB 1000 mg/200 mL premix (has no administration in time range)    Followed by   vancomycin  (VANCOCIN ) 500 mg in sodium chloride  0.9 % 100 mL IVPB (500 mg Intravenous New Bag/Given 12/14/23 1043)  vancomycin  (VANCOREADY) IVPB 1500 mg/300 mL (has no administration in time range)  potassium chloride  SA (KLOR-CON  M) CR tablet 40 mEq (has no administration in time range)    ED Course/ Medical Decision Making/ A&P                                 Medical Decision Making Amount and/or Complexity of Data Reviewed Labs: ordered.  Risk Prescription drug management.   This is a 73 year old gentleman presenting with positive blood cultures for staph species.  Both sets of blood cultures seem to have been positive for staph species, blood cultures are yet to speciate.  His known groin/skin infection may have been a source for this.  The patient does endorse some systemic symptoms such as increased fatigue, malaise, weakness but otherwise denies fevers, chills, night sweats.  At last ED visit white blood cell count was normal.  Lactic acid was trivially elevated but normalized on repeat.  I will obtain repeat CBC with differential, CMP, lactic acid, and blood cultures.  Given staph species on blood cultures I will initiate vancomycin .  Patient took a single dose of doxycycline  night prior to ED presentation.  CBC with normal white blood cell count, trivial anemia.  Platelets are decreased.  CMP with hypokalemia, increased bili to 1.7 (3.6 two days ago).  Lactic acid normal.  Blood cultures pending.  Do wonder about hepatic fibrosis/cirrhosis in setting of thrombocytopenia and increased bili.  I was able to speak with Dr. Arzella Laurence of infectious disease, I appreciate her consultation.  Admittedly it is difficult to differentiate whether this is contaminant or truly reflects bacteremia.  Given the patient is having some systemic symptoms (anorexia, malaise, weakness) and they did grow bacteria in both sets of cultures, inpatient admission to monitor blood cultures, receive IV  antibiotics, and possibly obtain echo was advised.  I was able to speak with Dr. Bryce Captain of [facility] hospitalist who kindly accepted the patient for inpatient observation/evaluation.         Final Clinical Impression(s) / ED Diagnoses Final diagnoses:  Positive blood culture    Rx / DC Orders ED Discharge Orders     None         Sheree Dieter, MD 12/14/23 1157    Scarlette Currier, MD 12/15/23 0001

## 2023-12-14 NOTE — Plan of Care (Signed)
   Problem: Clinical Measurements: Goal: Ability to maintain clinical measurements within normal limits will improve Outcome: Progressing Goal: Will remain free from infection Outcome: Progressing

## 2023-12-14 NOTE — Plan of Care (Signed)
  Problem: Clinical Measurements: Goal: Ability to maintain clinical measurements within normal limits will improve 12/14/2023 1902 by Carlie Chen, RN Outcome: Progressing 12/14/2023 1902 by Carlie Chen, RN Outcome: Progressing Goal: Will remain free from infection 12/14/2023 1902 by Carlie Chen, RN Outcome: Progressing 12/14/2023 1902 by Carlie Chen, RN Outcome: Progressing Goal: Diagnostic test results will improve Outcome: Progressing

## 2023-12-14 NOTE — ED Triage Notes (Signed)
 Pt was seen on 4/21 and received a call this morning that he needed to return to ED because he had positive blood cultures,

## 2023-12-14 NOTE — Progress Notes (Signed)
 PHARMACY NOTE:  ANTIMICROBIAL RENAL DOSAGE ADJUSTMENT  Current antimicrobial regimen includes a mismatch between antimicrobial dosage and estimated renal function.  As per policy approved by the Pharmacy & Therapeutics and Medical Executive Committees, the antimicrobial dosage will be adjusted accordingly.  Current antimicrobial dosage:  Cefepime  2gm IV q12h  Indication: groin infection  Renal Function:  Estimated Creatinine Clearance: 70.4 mL/min (by C-G formula based on SCr of 0.92 mg/dL).    Antimicrobial dosage has been changed to:  Cefepime  2gm IV q8h   Thank you for allowing pharmacy to be a part of this patient's care.  Enrigue Harvard, PharmD, BCPS Please see amion for complete clinical pharmacist phone list 12/14/2023 5:32 PM

## 2023-12-14 NOTE — ED Notes (Signed)
 Attempted report, no answer, left return number with secretary 5 m ,[place].

## 2023-12-14 NOTE — Care Plan (Signed)
 73 yo M PMHx wheel chair dependent, gout, asthma, A-fib, ETOH use who was seen 12/12/2023 at [facility] for a groin infection. He was discharged home with doxycyline and nystatin . Patient was called to come back to the ER due to his blood cx's growing staph species. Pt started on IV vancomycin  and new blood cx's obtained 12/14/2023. [facility] medicine contacted for admission for ECHO and further management of the pt's bacteremia.

## 2023-12-14 NOTE — H&P (Signed)
 History and Physical    Patient: Taylor Myers FAO:[id number] DOB: Jun 19, 1951 DOA: 12/14/2023 DOS: the patient was seen and examined on 12/14/2023 PCP: System, Provider Not In     Chief Complaint:  Chief Complaint  Patient presents with   Abnormal Lab   HPI: 73 yo M PMHx wheel chair dependent, gout, asthma, A-fib, ETOH use who was seen 12/12/2023 at [facility] for a groin infection. The rash started 1 month ago. However, over the past 3-4 days it started to become painful and smell. He was discharged home with doxycyline and nystatin . Patient was called to come back to the ER due to his blood cx's growing staph species. Pt started on IV vancomycin  and new blood cx's obtained 12/14/2023. [facility] medicine contacted for admission for ECHO and further management of the pt's bacteremia.    Review of Systems: Review of Systems  Constitutional:  Negative for chills and fever.  HENT:  Negative for hearing loss and tinnitus.   Eyes:  Negative for blurred vision and double vision.  Respiratory:  Negative for cough and shortness of breath.   Cardiovascular:  Negative for chest pain and palpitations.  Gastrointestinal:  Negative for nausea and vomiting.  Genitourinary:  Negative for flank pain.  Musculoskeletal:  Negative for back pain.  Skin:  Positive for rash.  Neurological:  Negative for dizziness and headaches.  Endo/Heme/Allergies:  Negative for polydipsia.  Psychiatric/Behavioral:  Negative for depression.     Past Medical History:  Diagnosis Date   A-fib (HCC)    Alcohol  abuse, in remission    Arthritis    Asthma    [place] asthma   History of kidney stones    "dormant kidney stone"   Hypertension    Wheelchair dependent    Past Surgical History:  Procedure Laterality Date   ANKLE SURGERY Right    APPLICATION OF WOUND VAC Right 03/19/2022   Procedure: APPLICATION OF WOUND VAC;  Surgeon: Wes Hamman, MD;  Location: [facility] OR;  Service: Orthopedics;  Laterality: Right;   HIP SURGERY  Right    total replacement   KNEE SURGERY     bilateral replacements   ORIF HUMERUS FRACTURE Right 03/19/2022   Procedure: OPEN REDUCTION INTERNAL FIXATION (ORIF) RIGHT DISTAL HUMERUS FRACTURE;  Surgeon: Wes Hamman, MD;  Location: [facility] OR;  Service: Orthopedics;  Laterality: Right;   Social History:  reports that he has quit smoking. His smoking use included cigarettes. He has never used smokeless tobacco. He reports current alcohol  use of about 2.0 standard drinks of alcohol  per week. He reports that he does not currently use drugs.  No Known Allergies  History reviewed. No pertinent family history.  Prior to Admission medications   Medication Sig Start Date End Date Taking? Authorizing Provider  amLODipine  (NORVASC ) 5 MG tablet Take 1 tablet (5 mg total) by mouth daily. 09/02/23 12/01/23  Wendie Hamburg, MD  apixaban  (ELIQUIS ) 5 MG TABS tablet Take 1 tablet (5 mg total) by mouth 2 (two) times daily. 09/02/23   Wendie Hamburg, MD  atorvastatin  (LIPITOR) 40 MG tablet Take 1 tablet (40 mg total) by mouth daily. 09/02/23 12/01/23  Wendie Hamburg, MD  DHA-EPA-Flaxseed Oil-Vitamin E [facility] NUTRITION) CAPS Take 1 capsule by mouth daily.    [provider]  doxycycline  (VIBRAMYCIN ) 100 MG capsule Take 1 capsule (100 mg total) by mouth 2 (two) times daily. 12/12/23   Afton Horse T, DO  ibuprofen (ADVIL) 600 MG tablet Take 600 mg by mouth  every 6 (six) hours as needed for moderate pain (pain score 4-6).    [provider]  Magnesium  Oxide 400 MG CAPS Take 1 capsule (400 mg total) by mouth daily. 09/14/23   Wendie Hamburg, MD  Multiple Vitamins-Minerals (LIVER DETOX PO) Take 2 tablets by mouth daily.    [provider]  NON FORMULARY Take 2 capsules by mouth See admin instructions. Beet extract capsules- Take 2 capsules by mouth once a day    [provider]  nystatin  cream (MYCOSTATIN ) Apply to affected area 2 times daily  12/12/23   Afton Horse T, DO  tamsulosin  (FLOMAX ) 0.4 MG CAPS capsule Take 0.4 mg by mouth daily.    [provider]    Physical Exam: Vitals:   12/14/23 0632 12/14/23 1047 12/14/23 1637  BP: 127/83 106/86 (!) 142/98  Pulse: 82 96 91  Resp: 19 20 18   Temp: 98.9 F (37.2 C)  98.5 F (36.9 C)  SpO2: 98% 96% 99%   Physical Exam HENT:     Head: Normocephalic.     Mouth/Throat:     Mouth: Mucous membranes are moist.  Cardiovascular:     Rate and Rhythm: Normal rate.  Pulmonary:     Effort: Pulmonary effort is normal.  Abdominal:     Palpations: Abdomen is soft.  Musculoskeletal:        General: Normal range of motion.     Cervical back: Neck supple.  Skin:    General: Skin is warm.     Comments: R groin/gluteal fold erythema  Neurological:     Mental Status: He is alert and oriented to person, place, and time.  Psychiatric:        Mood and Affect: Mood normal.    Assessment and Plan: Staph bacteremia due to R gluteal/groin cellulitis - IV vancomycin  1500 mg daily  - IV cefepime  2 g q12  - Nystatin  powder bid to groin  - ECHO due to gram + bacteremia   Chronic afib  - Eliquis  5 mg PO bid   ETOH use  - Thiamine /folic acid /MVI PO daily   4. HTN  - Norvasc  5 mg PO daily   5. HLD  - Lipitor 40 mg PO daily    Severity of Illness: The appropriate patient status for this patient is INPATIENT. Inpatient status is judged to be reasonable and necessary in order to provide the required intensity of service to ensure the patient's safety. The patient's presenting symptoms, physical exam findings, and initial radiographic and laboratory data in the context of their chronic comorbidities is felt to place them at high risk for further clinical deterioration. Furthermore, it is not anticipated that the patient will be medically stable for discharge from the hospital within 2 midnights of admission.   * I certify that at the point of admission it is my clinical  judgment that the patient will require inpatient hospital care spanning beyond 2 midnights from the point of admission due to high intensity of service, high risk for further deterioration and high frequency of surveillance required.*  Author: Diyan Dave , MD 12/14/2023 5:12 PM  For on call review [email or web address].

## 2023-12-14 NOTE — Progress Notes (Signed)
 Pharmacy Antibiotic Note  Taylor Myers is a 73 y.o. male admitted on 12/14/2023 with bacteremia.  Pharmacy has been consulted for vancomycin  dosing. Pt is afebrile and WBC is WNL. SCr is WNL. Pt with positive blood cultures from recent ED visit.   Plan: Vanc 1500mg  IV Q24H (AUC 443, SCr 0.92) F/u renal fxn, C&S, clinical status and peak/trough at SS    Temp (24hrs), Avg:98.9 F (37.2 C), Min:98.9 F (37.2 C), Max:98.9 F (37.2 C)  Recent Labs  Lab 12/12/23 2142 12/12/23 2329 12/14/23 0945  WBC 8.4  --  6.6  CREATININE 0.77  --  0.92  LATICACIDVEN 2.1* 1.6 1.6    Estimated Creatinine Clearance: 70.4 mL/min (by C-G formula based on SCr of 0.92 mg/dL).    No Known Allergies  Antimicrobials this admission: Vanc 4/23>>   Dose adjustments this admission: N/A  Microbiology results: 4/21 Blood - [facility] (staph)   Thank you for allowing pharmacy to be a part of this patient's care.  Marylene Masek, Kathlene Paradise 12/14/2023 11:10 AM

## 2023-12-14 NOTE — ED Notes (Signed)
 Taylor Myers at [facility] for transport

## 2023-12-15 ENCOUNTER — Inpatient Hospital Stay (HOSPITAL_COMMUNITY)

## 2023-12-15 DIAGNOSIS — R7881 Bacteremia: Secondary | ICD-10-CM | POA: Diagnosis not present

## 2023-12-15 DIAGNOSIS — B958 Unspecified staphylococcus as the cause of diseases classified elsewhere: Secondary | ICD-10-CM | POA: Diagnosis not present

## 2023-12-15 LAB — COMPREHENSIVE METABOLIC PANEL WITH GFR
ALT: 12 U/L (ref 0–44)
AST: 25 U/L (ref 15–41)
Albumin: 2.7 g/dL — ABNORMAL LOW (ref 3.5–5.0)
Alkaline Phosphatase: 43 U/L (ref 38–126)
Anion gap: 9 (ref 5–15)
BUN: 13 mg/dL (ref 8–23)
CO2: 29 mmol/L (ref 22–32)
Calcium: 8.1 mg/dL — ABNORMAL LOW (ref 8.9–10.3)
Chloride: 101 mmol/L (ref 98–111)
Creatinine, Ser: 0.8 mg/dL (ref 0.61–1.24)
GFR, Estimated: >60 mL/min (ref >60)
Glucose, Bld: 98 mg/dL (ref 70–99)
Potassium: 2.9 mmol/L — ABNORMAL LOW (ref 3.5–5.1)
Sodium: 139 mmol/L (ref 135–145)
Total Bilirubin: 1.1 mg/dL (ref 0.0–1.2)
Total Protein: 5.3 g/dL — ABNORMAL LOW (ref 6.5–8.1)

## 2023-12-15 LAB — CBC
HCT: 32.7 % — ABNORMAL LOW (ref 39.0–52.0)
Hemoglobin: 11.1 g/dL — ABNORMAL LOW (ref 13.0–17.0)
MCH: 32.7 pg (ref 26.0–34.0)
MCHC: 33.9 g/dL (ref 30.0–36.0)
MCV: 96.5 fL (ref 80.0–100.0)
Platelets: 92 10*3/uL — ABNORMAL LOW (ref 150–400)
RBC: 3.39 MIL/uL — ABNORMAL LOW (ref 4.22–5.81)
RDW: 13.3 % (ref 11.5–15.5)
WBC: 4.3 10*3/uL (ref 4.0–10.5)
nRBC: 0 % (ref 0.0–0.2)

## 2023-12-15 LAB — ECHOCARDIOGRAM COMPLETE
AR max vel: 1.61 cm2
AV Area VTI: 1.56 cm2
AV Area mean vel: 1.6 cm2
AV Mean grad: 4.4 mmHg
AV Peak grad: 8.2 mmHg
Ao pk vel: 1.43 m/s
Area-P 1/2: 3.77 cm2
Height: 69 in
S' Lateral: 3.8 cm
Weight: 2610.25 [oz_av]

## 2023-12-15 LAB — MAGNESIUM: Magnesium: 1.2 mg/dL — ABNORMAL LOW (ref 1.7–2.4)

## 2023-12-15 LAB — C-REACTIVE PROTEIN: CRP: 0.5 mg/dL (ref <1.0)

## 2023-12-15 MED ORDER — POTASSIUM CHLORIDE CRYS ER 20 MEQ PO TBCR
40.0000 meq | EXTENDED_RELEASE_TABLET | Freq: Once | ORAL | Status: AC
  Administered 2023-12-15: 40 meq via ORAL
  Filled 2023-12-15: qty 2

## 2023-12-15 MED ORDER — POLYVINYL ALCOHOL 1.4 % OP SOLN
1.0000 [drp] | Freq: Four times a day (QID) | OPHTHALMIC | Status: DC | PRN
  Administered 2023-12-15 – 2023-12-16 (×3): 1 [drp] via OPHTHALMIC
  Filled 2023-12-15: qty 15

## 2023-12-15 MED ORDER — MAGNESIUM SULFATE 4 GM/100ML IV SOLN
4.0000 g | Freq: Once | INTRAVENOUS | Status: AC
  Administered 2023-12-15: 4 g via INTRAVENOUS
  Filled 2023-12-15: qty 100

## 2023-12-15 NOTE — TOC CM/SW Note (Signed)
 Transition of Care [facility]) - Inpatient Brief Assessment   Patient Details  Name: Taylor Myers MRN: [id number] Date of Birth: [birth year]  Transition of Care [facility]) CM/SW Contact:    Tom-Johnson, Angelique Ken, RN Phone Number: 12/15/2023, 3:58 PM   Clinical Narrative:  Patient presented to the ED on 12/12/23 with Rash to Groin that spread to his Rt side and Back. Was called to return to the ED because he had positive Blood Cultures. Admitted with Bacteremia d/t Staphylococcus. Currently on IV abx, ID following.    From home with his father who is 57 yrs old per patient. Patient states he is Wheelchair bound but assists his father when needed. His father has caregivers from [facility]. Patient is active with Home health disciplines from [place]. Patient does not drive, uses transportation from [facility] and friends transports sometimes. Has all necessary DME's at home.  PCP is Maryellen Snare, NP and uses [employer] on [address] [facility]  Patient not Medically ready for discharge.  CM will continue to follow as patient progresses with care towards discharge.           Transition of Care Asessment: Insurance and Status: Insurance coverage has been reviewed Patient has primary care physician: Yes Home environment has been reviewed: Yes Prior level of function:: Modified Independent Prior/Current Home Services: Current home services Social Drivers of Health Review: SDOH reviewed no interventions necessary Readmission risk has been reviewed: Yes Transition of care needs: transition of care needs identified, TOC will continue to follow

## 2023-12-15 NOTE — Progress Notes (Signed)
*  PRELIMINARY RESULTS* Echocardiogram 2D Echocardiogram has been performed.  Taylor Myers Taylor Myers 12/15/2023, 1:11 PM

## 2023-12-15 NOTE — Consult Note (Signed)
 [facility]  Total days of antibiotics 2       Reason for Consult:staphycoccal, CoNS, bacteremia of unclear origin   Referring Physician: sira  Principal Problem:   Bacteremia due to Staphylococcus    HPI: Taylor Myers is a 73 y.o. male with hx of wheelchair dependent, hx of afib, trigeminal neuralgia, right eye blindness HTN, hx of left THA, and bilateral TKA who was seen in the ED on 4/21 for feeling poorly worsening cellulitis vs. Candidal skin infection originating from groin. He states he has been feeling unwell but no overt fevers, chills, nor nightsweats for a few weeks. At ED visit on 4/21, 1 bottle from each set of blood cx + GPC, staph epi. He was asked to return for evaluation. He reports erythema from lower abdomen is improved. Due to malaise and bacteremia, he was admitted and started on vancomycin . Repeat blood cx from 4/23 also showing 2/2 bottles + GPC. ID on 4/21 - s. Hominis and 2nd bottle staph epi  Past Medical History:  Diagnosis Date   A-fib (HCC)    Alcohol  abuse, in remission    Arthritis    Asthma    child hood asthma   History of kidney stones    "dormant kidney stone"   Hypertension    Wheelchair dependent     Allergies: No Known Allergies   MEDICATIONS:  amLODipine   5 mg Oral Daily   apixaban   5 mg Oral BID   atorvastatin   40 mg Oral Daily   folic acid   1 mg Oral Daily   multivitamin with minerals  1 tablet Oral Daily   nystatin    Topical BID   tamsulosin   0.4 mg Oral QPC supper   thiamine   100 mg Oral Daily    Social History   Tobacco Use   Smoking status: Former    Types: Cigarettes   Smokeless tobacco: Never  Vaping Use   Vaping status: Never Used  Substance Use Topics   Alcohol  use: Yes    Alcohol /week: 2.0 standard drinks of alcohol     Types: 2 Standard drinks or equivalent per week    Comment: occasionally   Drug use: Not Currently    History reviewed. No pertinent family history.   Review of  Systems  Constitutional: + malaise. Negative for fever, chills, diaphoresis, activity change, appetite change, fatigue and unexpected weight change.  HENT: Negative for congestion, sore throat, rhinorrhea, sneezing, trouble swallowing and sinus pressure.  Eyes: Negative for photophobia and visual disturbance.  Respiratory: Negative for cough, chest tightness, shortness of breath, wheezing and stridor.  Cardiovascular: Negative for chest pain, palpitations and leg swelling.  Gastrointestinal: Negative for nausea, vomiting, abdominal pain, diarrhea, constipation, blood in stool, abdominal distention and anal bleeding.  Genitourinary: Negative for dysuria, hematuria, flank pain and difficulty urinating.  Musculoskeletal: Negative for myalgias, back pain, joint swelling, arthralgias and gait problem.  Skin: Negative for color change, pallor, rash and wound.  Neurological: Negative for dizziness, tremors, weakness and light-headedness.  Hematological: Negative for adenopathy. Does not bruise/bleed easily.  Psychiatric/Behavioral: Negative for behavioral problems, confusion, sleep disturbance, dysphoric mood, decreased concentration and agitation.    OBJECTIVE: Temp:  [98.1 F (36.7 C)-98.9 F (37.2 C)] 98.1 F (36.7 C) (04/24 0757) Pulse Rate:  [45-91] 84 (04/24 0757) Resp:  [18] 18 (04/24 0444) BP: (95-142)/(74-98) 109/83 (04/24 0757) SpO2:  [92 %-99 %] 97 % (04/24 0757) Weight:  [74 kg] 74 kg (04/24 0631) Physical Exam  Constitutional: He is oriented to person, place, and time. He appears well-developed and well-nourished. No distress.  HENT:  Mouth/Throat: Oropharynx is clear and moist. No oropharyngeal exudate.  Cardiovascular: Normal rate, regular rhythm and normal heart sounds. Exam reveals no gallop and no friction rub.  No murmur heard.  Pulmonary/Chest: Effort normal and breath sounds normal. No respiratory distress. He has no wheezes.  Abdominal: Soft. Bowel sounds are normal. He  exhibits no distension. There is no tenderness.  Lymphadenopathy:  He has no cervical adenopathy.  Neurological: He is alert and oriented to person, place, and time.  Skin: Skin is warm and dry. +intertrigo of abdominal skin fold Psychiatric: He has a normal mood and affect. His behavior is normal.    LABS: Results for orders placed or performed during the hospital encounter of 12/14/23 (from the past 48 hours)  Blood culture (routine x 2)     Status: None (Preliminary result)   Collection Time: 12/14/23  9:45 AM   Specimen: BLOOD  Result Value Ref Range   Specimen Description      BLOOD FATTY CASTS Performed at [facility], [address], [place], [place] [address]    Special Requests      Blood Culture adequate volume Performed at [facility], [address], [place], [place] [address]    Culture  Setup Time      GRAM POSITIVE COCCI IN BOTH AEROBIC AND ANAEROBIC BOTTLES CRITICAL VALUE NOTED.  VALUE IS CONSISTENT WITH PREVIOUSLY REPORTED AND CALLED VALUE. Performed at [facility], [address] [place], [place] [address]    Culture GRAM POSITIVE COCCI    Report Status PENDING   Blood culture (routine x 2)     Status: None (Preliminary result)   Collection Time: 12/14/23  9:45 AM   Specimen: BLOOD  Result Value Ref Range   Specimen Description      BLOOD FATTY CASTS Performed at [facility], [address] [place], [place] [address]    Special Requests      Blood Culture adequate volume Performed at [facility], [address], [place], [place] [address]    Culture      NO GROWTH < 24 HOURS Performed at [facility], [address] [place], [place] [address]    Report Status PENDING   CBC with Differential     Status: Abnormal   Collection Time: 12/14/23  9:45 AM  Result Value Ref Range   WBC 6.6 4.0 - 10.5 K/uL   RBC 3.94 (L) 4.22 - 5.81 MIL/uL   Hemoglobin 12.9  (L) 13.0 - 17.0 g/dL   HCT 57.8 (L) 46.9 - 62.9 %   MCV 95.4 80.0 - 100.0 fL   MCH 32.7 26.0 - 34.0 pg   MCHC 34.3 30.0 - 36.0 g/dL   RDW 52.8 41.3 - 24.4 %   Platelets 100 (L) 150 - 400 K/uL   nRBC 0.0 0.0 - 0.2 %   Neutrophils Relative % 78 %   Neutro Abs 5.2 1.7 - 7.7 K/uL   Lymphocytes Relative 13 %   Lymphs Abs 0.9 0.7 - 4.0 K/uL   Monocytes Relative 7 %   Monocytes Absolute 0.5 0.1 - 1.0 K/uL   Eosinophils Relative 1 %   Eosinophils Absolute 0.0 0.0 - 0.5 K/uL   Basophils Relative 0 %   Basophils Absolute 0.0 0.0 - 0.1 K/uL   Immature Granulocytes 1 %   Abs Immature Granulocytes 0.03 0.00 -  0.07 K/uL    Comment: Performed at [facility], [address] [place], [place] [address]  Comprehensive metabolic panel     Status: Abnormal   Collection Time: 12/14/23  9:45 AM  Result Value Ref Range   Sodium 140 135 - 145 mmol/L   Potassium 3.4 (L) 3.5 - 5.1 mmol/L    Comment: HEMOLYSIS AT THIS LEVEL MAY AFFECT RESULT   Chloride 99 98 - 111 mmol/L   CO2 26 22 - 32 mmol/L   Glucose, Bld 96 70 - 99 mg/dL    Comment: Glucose reference range applies only to samples taken after fasting for at least 8 hours.   BUN 16 8 - 23 mg/dL   Creatinine, Ser 6.04 0.61 - 1.24 mg/dL   Calcium  8.9 8.9 - 10.3 mg/dL   Total Protein 6.3 (L) 6.5 - 8.1 g/dL   Albumin 3.9 3.5 - 5.0 g/dL   AST 35 15 - 41 U/L   ALT 14 0 - 44 U/L   Alkaline Phosphatase 75 38 - 126 U/L   Total Bilirubin 1.7 (H) 0.0 - 1.2 mg/dL   GFR, Estimated >54 >09 mL/min    Comment: (NOTE) Calculated using the CKD-EPI Creatinine Equation (2021)    Anion gap 15 5 - 15    Comment: Performed at [facility], [address] [place], [place] [address]  Lactic acid, plasma     Status: None   Collection Time: 12/14/23  9:45 AM  Result Value Ref Range   Lactic Acid, Venous 1.6 0.5 - 1.9 mmol/L    Comment: Performed at [facility], [address] [place], [place] [address]   Lactic acid, plasma     Status: None   Collection Time: 12/14/23  4:54 PM  Result Value Ref Range   Lactic Acid, Venous 1.7 0.5 - 1.9 mmol/L    Comment: Performed at [facility], [address] [place], [place] [address]  CBC     Status: Abnormal   Collection Time: 12/15/23  5:31 AM  Result Value Ref Range   WBC 4.3 4.0 - 10.5 K/uL   RBC 3.39 (L) 4.22 - 5.81 MIL/uL   Hemoglobin 11.1 (L) 13.0 - 17.0 g/dL   HCT 08.6 (L) 57.8 - 46.9 %   MCV 96.5 80.0 - 100.0 fL   MCH 32.7 26.0 - 34.0 pg   MCHC 33.9 30.0 - 36.0 g/dL   RDW 62.9 52.8 - 41.3 %   Platelets 92 (L) 150 - 400 K/uL    Comment: Immature Platelet Fraction may be clinically indicated, consider ordering this additional test [id number] REPEATED TO VERIFY PLATELET COUNT CONFIRMED BY SMEAR    nRBC 0.0 0.0 - 0.2 %    Comment: Performed at [facility], [address], [place], [place] [address]  Comprehensive metabolic panel     Status: Abnormal   Collection Time: 12/15/23  5:31 AM  Result Value Ref Range   Sodium 139 135 - 145 mmol/L   Potassium 2.9 (L) 3.5 - 5.1 mmol/L   Chloride 101 98 - 111 mmol/L   CO2 29 22 - 32 mmol/L   Glucose, Bld 98 70 - 99 mg/dL    Comment: Glucose reference range applies only to samples taken after fasting for at least 8 hours.   BUN 13 8 - 23 mg/dL   Creatinine, Ser 4.40 0.61 - 1.24 mg/dL   Calcium  8.1 (L) 8.9 - 10.3 mg/dL   Total Protein 5.3 (L) 6.5 -  8.1 g/dL   Albumin 2.7 (L) 3.5 - 5.0 g/dL   AST 25 15 - 41 U/L   ALT 12 0 - 44 U/L   Alkaline Phosphatase 43 38 - 126 U/L   Total Bilirubin 1.1 0.0 - 1.2 mg/dL   GFR, Estimated >52 >84 mL/min    Comment: (NOTE) Calculated using the CKD-EPI Creatinine Equation (2021)    Anion gap 9 5 - 15    Comment: Performed at [facility], [address], [place], [place] [address]  Magnesium      Status: Abnormal   Collection Time: 12/15/23  5:31 AM  Result Value Ref Range   Magnesium  1.2 (L) 1.7 - 2.4 mg/dL    Comment: Performed at  [facility], [address] [place], [place] [address]  C-reactive protein     Status: None   Collection Time: 12/15/23  5:31 AM  Result Value Ref Range   CRP 0.5 <1.0 mg/dL    Comment: Performed at [facility], [address], [place], [place] [address]    MICRO:  IMAGING: ECHOCARDIOGRAM COMPLETE Result Date: 12/15/2023    ECHOCARDIOGRAM REPORT   Patient Name:   EASTYN DATTILO Date of Exam: 12/15/2023 Medical Rec #:  [id number]     Height:       69.0 in Accession #:    [id number]    Weight:       163.1 lb Date of Birth:  [birth year]    BSA:          1.895 m Patient Age:    72 years      BP:           109/83 mmHg Patient Gender: M             HR:           90 bpm. Exam Location:  Inpatient Procedure: 2D Echo, Cardiac Doppler and Color Doppler (Both Spectral and Color            Flow Doppler were utilized during procedure). Indications:    Bacteremia  History:        Patient has prior history of Echocardiogram examinations, most                 recent 06/29/2023. ETOH abuse; Risk Factors:Hypertension,                 Diabetes and Former Smoker.  Sonographer:    Adelia Homestead RVT RCS Referring Phys: ZACKERY SIRA IMPRESSIONS  1. Left ventricular ejection fraction, by estimation, is 55 to 60%. The left ventricle has normal function. The left ventricle has no regional wall motion abnormalities. There is mild concentric left ventricular hypertrophy. Left ventricular diastolic function could not be evaluated.  2. Right ventricular systolic function is normal. The right ventricular size is normal.  3. Left atrial size was moderately dilated.  4. Right atrial size was moderately dilated.  5. The mitral valve is normal in structure. Trivial mitral valve regurgitation. No evidence of mitral stenosis.  6. By gradients, aortic stensosis is very mild but visually there appears to be at least moderate AS. Suspect component of LF/LG AS. Aaron Aas The aortic valve is tricuspid. There is moderate calcification of the  aortic valve. Aortic valve regurgitation is trivial. Mild to moderate aortic valve stenosis. Aortic valve area, by VTI measures 1.56 cm. Aortic valve mean gradient measures 4.4 mmHg. Aortic valve Vmax measures 1.43 m/s.  7. Aortic dilatation noted. There is  mild dilatation of the ascending aorta, measuring 40 mm. There is borderline dilatation of the aortic root, measuring 38 mm.  8. The inferior vena cava is normal in size with greater than 50% respiratory variability, suggesting right atrial pressure of 3 mmHg. FINDINGS  Left Ventricle: Left ventricular ejection fraction, by estimation, is 55 to 60%. The left ventricle has normal function. The left ventricle has no regional wall motion abnormalities. The left ventricular internal cavity size was normal in size. There is  mild concentric left ventricular hypertrophy. Left ventricular diastolic function could not be evaluated due to atrial fibrillation. Left ventricular diastolic function could not be evaluated. Right Ventricle: The right ventricular size is normal. No increase in right ventricular wall thickness. Right ventricular systolic function is normal. Left Atrium: Left atrial size was moderately dilated. Right Atrium: Right atrial size was moderately dilated. Pericardium: There is no evidence of pericardial effusion. Mitral Valve: The mitral valve is normal in structure. Trivial mitral valve regurgitation. No evidence of mitral valve stenosis. Tricuspid Valve: The tricuspid valve is normal in structure. Tricuspid valve regurgitation is trivial. No evidence of tricuspid stenosis. Aortic Valve: By gradients, aortic stensosis is very mild but visually there appears to be at least moderate AS. Suspect component of LF/LG AS. The aortic valve is tricuspid. There is moderate calcification of the aortic valve. Aortic valve regurgitation  is trivial. Mild to moderate aortic stenosis is present. Aortic valve mean gradient measures 4.4 mmHg. Aortic valve peak  gradient measures 8.2 mmHg. Aortic valve area, by VTI measures 1.56 cm. Pulmonic Valve: The pulmonic valve was normal in structure. Pulmonic valve regurgitation is trivial. No evidence of pulmonic stenosis. Aorta: Aortic dilatation noted. There is mild dilatation of the ascending aorta, measuring 40 mm. There is borderline dilatation of the aortic root, measuring 38 mm. Venous: The inferior vena cava is normal in size with greater than 50% respiratory variability, suggesting right atrial pressure of 3 mmHg. IAS/Shunts: No atrial level shunt detected by color flow Doppler.  LEFT VENTRICLE PLAX 2D LVIDd:         5.60 cm   Diastology LVIDs:         3.80 cm   LV e' medial:    7.62 cm/s LV PW:         1.20 cm   LV E/e' medial:  9.9 LV IVS:        1.20 cm   LV e' lateral:   11.20 cm/s LVOT diam:     2.10 cm   LV E/e' lateral: 6.7 LV SV:         37 LV SV Index:   19 LVOT Area:     3.46 cm  RIGHT VENTRICLE            IVC RV Basal diam:  4.20 cm    IVC diam: 1.60 cm RV Mid diam:    3.40 cm RV S prime:     9.65 cm/s LEFT ATRIUM             Index        RIGHT ATRIUM           Index LA diam:        4.90 cm 2.59 cm/m   RA Area:     19.00 cm LA Vol (A2C):   82.9 ml 43.75 ml/m  RA Volume:   48.30 ml  25.49 ml/m LA Vol (A4C):   63.2 ml 33.33 ml/m LA Biplane Vol: 86.6 ml 45.71 ml/m  AORTIC VALVE                    PULMONIC VALVE AV Area (Vmax):    1.61 cm     PV Vmax:       0.77 m/s AV Area (Vmean):   1.60 cm     PV Peak grad:  2.4 mmHg AV Area (VTI):     1.56 cm AV Vmax:           143.00 cm/s AV Vmean:          97.200 cm/s AV VTI:            0.235 m AV Peak Grad:      8.2 mmHg AV Mean Grad:      4.4 mmHg LVOT Vmax:         66.47 cm/s LVOT Vmean:        44.867 cm/s LVOT VTI:          0.106 m LVOT/AV VTI ratio: 0.45  AORTA Ao Root diam: 3.80 cm Ao Asc diam:  4.00 cm MITRAL VALVE MV Area (PHT): 3.77 cm    SHUNTS MV Decel Time: 201 msec    Systemic VTI:  0.11 m MV E velocity: 75.55 cm/s  Systemic Diam: 2.10 cm MV A  velocity: 25.95 cm/s MV E/A ratio:  2.91 Jules Oar MD Electronically signed by Jules Oar MD Signature Date/Time: 12/15/2023/1:24:35 PM    Final     HISTORICAL MICRO/IMAGING  Assessment/Plan:  73yo M with intertrigo of abdominal skin fold +/- cellulitis with possible secondary bacteremia  - continue on vancomycin  alone, can d/c cefepime  - due to ongoing bacteremia, recommend TTE for now - will check vancomycin  TDM level plus cr function - will ask micro to identify gpc from 4/23 to see if same isolate as 4/21. - he does have flaky dermatitis appearance of skin so I do wonder about skin contamination  Candidal skin infection = continue with topical nystatin .  Hypokalemia = defer to primary team for K+ repletion  Thrombocytopenia= has hx of EOTH use, but his baseline is closer to 200. Continue to monitor and may need to discontinue meds associated with thrombocytopenia  I have personally spent 85  minutes involved in face-to-face and non-face-to-face activities for this patient on the day of the visit. Professional time spent includes the following activities: Preparing to see the patient (review of tests), Obtaining and/or reviewing separately obtained history (admission/discharge record), Performing a medically appropriate examination and/or evaluation , Ordering medications/tests/procedures, referring and communicating with other health care professionals, Documenting clinical information in the EMR, Independently interpreting results (not separately reported), Communicating results to the patient/family/caregiver, Counseling and educating the patient/family/caregiver and Care coordination (not separately reported).     Gerold Kos Levern Reader MD MPH [facility] [phone]   Gerold Kos. Levern Reader MD MPH [facility] [phone]

## 2023-12-15 NOTE — Progress Notes (Signed)
  Progress Note   Patient: Taylor Myers ZOX:[id number] DOB: [birth year] DOA: 12/14/2023     1 DOS: the patient was seen and examined on 12/15/2023   Assessment and Plan: Staph bacteremia due to R gluteal/groin cellulitis - IV vancomycin  1500 mg daily  - Nystatin  powder bid to groin  - ECHO due to gram + bacteremia    Chronic afib  - Eliquis  5 mg PO bid    ETOH use  - Thiamine /folic acid /MVI PO daily    4. HTN  - Norvasc  5 mg PO daily    5. HLD  - Lipitor 40 mg PO daily   Subjective: Pt seen and examined at the bedside. Labs are stable. CRP and WBC wnl.  Pt reports improvement with the nystatin  powder and IV antibx.  ID consulted today for assistance with course, duration and selction of antibx going forwad.  Physical Exam: Vitals:   12/15/23 0444 12/15/23 0630 12/15/23 0631 12/15/23 0757  BP: 95/74   109/83  Pulse: 61   84  Resp: 18     Temp: 98.4 F (36.9 C)   98.1 F (36.7 C)  TempSrc:   Oral Oral  SpO2: 96%   97%  Weight:   74 kg   Height:  5\' 9"  (1.753 m)     HENT:     Head: Normocephalic.     Mouth/Throat:     Mouth: Mucous membranes are moist.  Cardiovascular:     Rate and Rhythm: Normal rate.  Pulmonary:     Effort: Pulmonary effort is normal.  Abdominal:     Palpations: Abdomen is soft.  Musculoskeletal:        General: Normal range of motion.     Cervical back: Neck supple.  Skin:    General: Skin is warm.     Comments: R groin/gluteal fold erythema  Neurological:     Mental Status: He is alert and oriented to person, place, and time.  Psychiatric:        Mood and Affect: Mood normal.     Disposition: Status is: Inpatient Remains inpatient appropriate because: IV antibx and ID consult  Planned Discharge Destination: Barriers to discharge: As above     Time spent: 35 minutes  Author: Rahn Lacuesta , MD 12/15/2023 10:43 AM  For on call review [email or web address].

## 2023-12-15 NOTE — Plan of Care (Signed)

## 2023-12-16 ENCOUNTER — Ambulatory Visit: Admitting: Podiatrist

## 2023-12-16 DIAGNOSIS — R7881 Bacteremia: Secondary | ICD-10-CM | POA: Diagnosis not present

## 2023-12-16 DIAGNOSIS — B958 Unspecified staphylococcus as the cause of diseases classified elsewhere: Secondary | ICD-10-CM | POA: Diagnosis not present

## 2023-12-16 LAB — CULTURE, BLOOD (ROUTINE X 2)

## 2023-12-16 LAB — COMPREHENSIVE METABOLIC PANEL WITH GFR
ALT: 17 U/L (ref 0–44)
AST: 26 U/L (ref 15–41)
Albumin: 2.7 g/dL — ABNORMAL LOW (ref 3.5–5.0)
Alkaline Phosphatase: 45 U/L (ref 38–126)
Anion gap: 8 (ref 5–15)
BUN: 9 mg/dL (ref 8–23)
CO2: 26 mmol/L (ref 22–32)
Calcium: 8.1 mg/dL — ABNORMAL LOW (ref 8.9–10.3)
Chloride: 104 mmol/L (ref 98–111)
Creatinine, Ser: 0.75 mg/dL (ref 0.61–1.24)
GFR, Estimated: >60 mL/min (ref >60)
Glucose, Bld: 89 mg/dL (ref 70–99)
Potassium: 3.7 mmol/L (ref 3.5–5.1)
Sodium: 138 mmol/L (ref 135–145)
Total Bilirubin: 0.8 mg/dL (ref 0.0–1.2)
Total Protein: 5.5 g/dL — ABNORMAL LOW (ref 6.5–8.1)

## 2023-12-16 LAB — CBC
HCT: 33.3 % — ABNORMAL LOW (ref 39.0–52.0)
Hemoglobin: 11.2 g/dL — ABNORMAL LOW (ref 13.0–17.0)
MCH: 33.4 pg (ref 26.0–34.0)
MCHC: 33.6 g/dL (ref 30.0–36.0)
MCV: 99.4 fL (ref 80.0–100.0)
Platelets: 98 10*3/uL — ABNORMAL LOW (ref 150–400)
RBC: 3.35 MIL/uL — ABNORMAL LOW (ref 4.22–5.81)
RDW: 13.5 % (ref 11.5–15.5)
WBC: 3.5 10*3/uL — ABNORMAL LOW (ref 4.0–10.5)
nRBC: 0 % (ref 0.0–0.2)

## 2023-12-16 LAB — MAGNESIUM: Magnesium: 1.9 mg/dL (ref 1.7–2.4)

## 2023-12-16 MED ORDER — ACETAMINOPHEN 500 MG PO TABS
1000.0000 mg | ORAL_TABLET | Freq: Four times a day (QID) | ORAL | Status: DC | PRN
  Administered 2023-12-16 – 2023-12-17 (×2): 1000 mg via ORAL
  Filled 2023-12-16: qty 2

## 2023-12-16 MED ORDER — CEFADROXIL 500 MG PO CAPS
1000.0000 mg | ORAL_CAPSULE | Freq: Two times a day (BID) | ORAL | Status: DC
  Administered 2023-12-17: 1000 mg via ORAL
  Filled 2023-12-16: qty 2

## 2023-12-16 MED ORDER — OXYCODONE HCL 5 MG PO TABS
2.5000 mg | ORAL_TABLET | Freq: Four times a day (QID) | ORAL | Status: DC | PRN
  Administered 2023-12-16: 2.5 mg via ORAL
  Filled 2023-12-16: qty 1

## 2023-12-16 MED ORDER — OXYCODONE HCL 5 MG PO TABS
5.0000 mg | ORAL_TABLET | Freq: Four times a day (QID) | ORAL | Status: DC | PRN
  Administered 2023-12-17: 5 mg via ORAL
  Filled 2023-12-16: qty 1

## 2023-12-16 MED ORDER — SODIUM CHLORIDE 0.9 % IV SOLN
INTRAVENOUS | Status: AC
Start: 2023-12-16 — End: 2023-12-17

## 2023-12-16 NOTE — Progress Notes (Signed)
 [facility]    Date of Admission:  12/14/2023   Total days of antibiotics 3   ID: Taylor Myers is a 73 y.o. male with  hx of candidal skin infection with secondary cellulitis Principal Problem:   Bacteremia due to Staphylococcus    Subjective: Afebrile.   Medications:   amLODipine   5 mg Oral Daily   apixaban   5 mg Oral BID   atorvastatin   40 mg Oral Daily   [START ON 12/17/2023] cefadroxil   1,000 mg Oral BID   folic acid   1 mg Oral Daily   multivitamin with minerals  1 tablet Oral Daily   nystatin    Topical BID   tamsulosin   0.4 mg Oral QPC supper   thiamine   100 mg Oral Daily    Objective: Vital signs in last 24 hours: Temp:  [97.8 F (36.6 C)-98.7 F (37.1 C)] 98.6 F (37 C) (04/25 1557) Pulse Rate:  [63-99] 72 (04/25 1557) Resp:  [17-20] 20 (04/25 1557) BP: (97-122)/(62-82) 122/62 (04/25 1557) SpO2:  [99 %-100 %] 100 % (04/25 1557)  Physical Exam  Constitutional: He is oriented to person, place, and time. He appears well-developed and well-nourished. No distress.  HENT:  Mouth/Throat: Oropharynx is clear and moist. No oropharyngeal exudate.  Cardiovascular: Normal rate, regular rhythm and normal heart sounds. Exam reveals no gallop and no friction rub.  No murmur heard.  Pulmonary/Chest: Effort normal and breath sounds normal. No respiratory distress. He has no wheezes.  Abdominal: Soft. Bowel sounds are normal. He exhibits no distension. There is no tenderness.  Lymphadenopathy:  He has no cervical adenopathy.  Neurological: He is alert and oriented to person, place, and time.  Skin: Skin is warm and dry. No rash noted. No erythema.  Psychiatric: He has a normal mood and affect. His behavior is normal.    Lab Results Recent Labs    12/15/23 0531 12/16/23 0606  WBC 4.3 3.5*  HGB 11.1* 11.2*  HCT 32.7* 33.3*  NA 139 138  K 2.9* 3.7  CL 101 104  CO2 29 26  BUN 13 9  CREATININE 0.80 0.75   Liver Panel Recent Labs     12/15/23 0531 12/16/23 0606  PROT 5.3* 5.5*  ALBUMIN 2.7* 2.7*  AST 25 26  ALT 12 17  ALKPHOS 43 45  BILITOT 1.1 0.8   Sedimentation Rate No results for input(s): "ESRSEDRATE" in the last 72 hours. C-Reactive Protein Recent Labs    12/15/23 0531  CRP 0.5    Microbiology: reviewed Studies/Results: ECHOCARDIOGRAM COMPLETE Result Date: 12/15/2023    ECHOCARDIOGRAM REPORT   Patient Name:   Taylor Myers Date of Exam: 12/15/2023 Medical Rec #:  [id number]     Height:       69.0 in Accession #:    [id number]    Weight:       163.1 lb Date of Birth:  May 28, 1951    BSA:          1.895 m Patient Age:    72 years      BP:           109/83 mmHg Patient Gender: M             HR:           90 bpm. Exam Location:  Inpatient Procedure: 2D Echo, Cardiac Doppler and Color Doppler (Both Spectral and Color            Flow Doppler were  utilized during procedure). Indications:    Bacteremia  History:        Patient has prior history of Echocardiogram examinations, most                 recent 06/29/2023. ETOH abuse; Risk Factors:Hypertension,                 Diabetes and Former Smoker.  Sonographer:    Adelia Homestead RVT RCS Referring Phys: ZACKERY SIRA IMPRESSIONS  1. Left ventricular ejection fraction, by estimation, is 55 to 60%. The left ventricle has normal function. The left ventricle has no regional wall motion abnormalities. There is mild concentric left ventricular hypertrophy. Left ventricular diastolic function could not be evaluated.  2. Right ventricular systolic function is normal. The right ventricular size is normal.  3. Left atrial size was moderately dilated.  4. Right atrial size was moderately dilated.  5. The mitral valve is normal in structure. Trivial mitral valve regurgitation. No evidence of mitral stenosis.  6. By gradients, aortic stensosis is very mild but visually there appears to be at least moderate AS. Suspect component of LF/LG AS. Aaron Aas The aortic valve is tricuspid. There is moderate  calcification of the aortic valve. Aortic valve regurgitation is trivial. Mild to moderate aortic valve stenosis. Aortic valve area, by VTI measures 1.56 cm. Aortic valve mean gradient measures 4.4 mmHg. Aortic valve Vmax measures 1.43 m/s.  7. Aortic dilatation noted. There is mild dilatation of the ascending aorta, measuring 40 mm. There is borderline dilatation of the aortic root, measuring 38 mm.  8. The inferior vena cava is normal in size with greater than 50% respiratory variability, suggesting right atrial pressure of 3 mmHg. FINDINGS  Left Ventricle: Left ventricular ejection fraction, by estimation, is 55 to 60%. The left ventricle has normal function. The left ventricle has no regional wall motion abnormalities. The left ventricular internal cavity size was normal in size. There is  mild concentric left ventricular hypertrophy. Left ventricular diastolic function could not be evaluated due to atrial fibrillation. Left ventricular diastolic function could not be evaluated. Right Ventricle: The right ventricular size is normal. No increase in right ventricular wall thickness. Right ventricular systolic function is normal. Left Atrium: Left atrial size was moderately dilated. Right Atrium: Right atrial size was moderately dilated. Pericardium: There is no evidence of pericardial effusion. Mitral Valve: The mitral valve is normal in structure. Trivial mitral valve regurgitation. No evidence of mitral valve stenosis. Tricuspid Valve: The tricuspid valve is normal in structure. Tricuspid valve regurgitation is trivial. No evidence of tricuspid stenosis. Aortic Valve: By gradients, aortic stensosis is very mild but visually there appears to be at least moderate AS. Suspect component of LF/LG AS. The aortic valve is tricuspid. There is moderate calcification of the aortic valve. Aortic valve regurgitation  is trivial. Mild to moderate aortic stenosis is present. Aortic valve mean gradient measures 4.4 mmHg.  Aortic valve peak gradient measures 8.2 mmHg. Aortic valve area, by VTI measures 1.56 cm. Pulmonic Valve: The pulmonic valve was normal in structure. Pulmonic valve regurgitation is trivial. No evidence of pulmonic stenosis. Aorta: Aortic dilatation noted. There is mild dilatation of the ascending aorta, measuring 40 mm. There is borderline dilatation of the aortic root, measuring 38 mm. Venous: The inferior vena cava is normal in size with greater than 50% respiratory variability, suggesting right atrial pressure of 3 mmHg. IAS/Shunts: No atrial level shunt detected by color flow Doppler.  LEFT VENTRICLE PLAX 2D LVIDd:  5.60 cm   Diastology LVIDs:         3.80 cm   LV e' medial:    7.62 cm/s LV PW:         1.20 cm   LV E/e' medial:  9.9 LV IVS:        1.20 cm   LV e' lateral:   11.20 cm/s LVOT diam:     2.10 cm   LV E/e' lateral: 6.7 LV SV:         37 LV SV Index:   19 LVOT Area:     3.46 cm  RIGHT VENTRICLE            IVC RV Basal diam:  4.20 cm    IVC diam: 1.60 cm RV Mid diam:    3.40 cm RV S prime:     9.65 cm/s LEFT ATRIUM             Index        RIGHT ATRIUM           Index LA diam:        4.90 cm 2.59 cm/m   RA Area:     19.00 cm LA Vol (A2C):   82.9 ml 43.75 ml/m  RA Volume:   48.30 ml  25.49 ml/m LA Vol (A4C):   63.2 ml 33.33 ml/m LA Biplane Vol: 86.6 ml 45.71 ml/m  AORTIC VALVE                    PULMONIC VALVE AV Area (Vmax):    1.61 cm     PV Vmax:       0.77 m/s AV Area (Vmean):   1.60 cm     PV Peak grad:  2.4 mmHg AV Area (VTI):     1.56 cm AV Vmax:           143.00 cm/s AV Vmean:          97.200 cm/s AV VTI:            0.235 m AV Peak Grad:      8.2 mmHg AV Mean Grad:      4.4 mmHg LVOT Vmax:         66.47 cm/s LVOT Vmean:        44.867 cm/s LVOT VTI:          0.106 m LVOT/AV VTI ratio: 0.45  AORTA Ao Root diam: 3.80 cm Ao Asc diam:  4.00 cm MITRAL VALVE MV Area (PHT): 3.77 cm    SHUNTS MV Decel Time: 201 msec    Systemic VTI:  0.11 m MV E velocity: 75.55 cm/s  Systemic Diam:  2.10 cm MV A velocity: 25.95 cm/s MV E/A ratio:  2.91 Jules Oar MD Electronically signed by Jules Oar MD Signature Date/Time: 12/15/2023/1:24:35 PM    Final      Assessment/Plan: Staph bacteremia == appears to be contaminant since 3 different isolates noted in various blood cx. Does not appear to be toxic. On exam does have mild cellulitis to abdominal fold which would warrant treatment. Recommend to finish out course with 4 addn days of cefadroxil or cephalexin to complete 7 day course  Candidal skin infection of groin and abdominal fold = continue with topical nystatin   Will sign off.  [facility] Pager: [phone]  12/16/2023, 4:43 PM

## 2023-12-16 NOTE — Care Management Important Message (Signed)
 Important Message  Patient Details  Name: Taylor Myers MRN: [id number] Date of Birth: 17-Dec-1950   Important Message Given:  Yes - Medicare IM     Wynonia Hedges 12/16/2023, 3:57 PM

## 2023-12-16 NOTE — Progress Notes (Signed)
 Held norvasc  for 97/80.  MD made aware and agreed

## 2023-12-16 NOTE — Plan of Care (Signed)

## 2023-12-16 NOTE — Progress Notes (Signed)
  Progress Note   Patient: Taylor Myers UJW:[id number] DOB: Nov 03, 1950 DOA: 12/14/2023     2 DOS: the patient was seen and examined on 12/16/2023   Assessment and Plan: Staph bacteremia due to R gluteal/groin cellulitis - IV vancomycin  1500 mg daily  - IV NS 85 cc/hr - Appreciate ID following along  - Nystatin  powder bid to groin  - ECHO appreciated    Chronic afib  - Eliquis  5 mg PO bid    ETOH use  - Thiamine /folic acid /MVI PO daily    4. HTN  - Norvasc  5 mg PO daily    5. HLD  - Lipitor 40 mg PO daily   Subjective: Pt seen and examined at the bedside. Labs wnl. Appreciate ID consult. ECHO appreciated. IV fluids ordered today as pt reports his urine has been quite dark. He continues on IV vancomycin .  Physical Exam: Vitals:   12/15/23 2011 12/16/23 0427 12/16/23 0815 12/16/23 0902  BP: 102/66 99/78 97/80  97/80  Pulse: 77 63 75   Resp: 18 19 17    Temp: 98.7 F (37.1 C) 97.8 F (36.6 C) 98.2 F (36.8 C)   TempSrc: Oral Oral Oral   SpO2: 99% 99% 100%   Weight:      Height:       HENT:     Head: Normocephalic.     Mouth/Throat:     Mouth: Mucous membranes are moist.  Cardiovascular:     Rate and Rhythm: Normal rate.  Pulmonary:     Effort: Pulmonary effort is normal.  Abdominal:     Palpations: Abdomen is soft.  Musculoskeletal:        General: Normal range of motion.     Cervical back: Neck supple.  Skin:    General: Skin is warm.     Comments: R groin/gluteal fold erythema  Neurological:     Mental Status: He is alert and oriented to person, place, and time.  Psychiatric:        Mood and Affect: Mood normal.   Disposition: Status is: Inpatient Remains inpatient appropriate because: IV antibx  Planned Discharge Destination: Barriers to discharge: As above    Time spent: 35 minutes  Author: Aquiles Ruffini , MD 12/16/2023 10:30 AM  For on call review [email or web address].

## 2023-12-17 ENCOUNTER — Telehealth (HOSPITAL_BASED_OUTPATIENT_CLINIC_OR_DEPARTMENT_OTHER): Payer: Self-pay | Admitting: *Deleted

## 2023-12-17 DIAGNOSIS — B958 Unspecified staphylococcus as the cause of diseases classified elsewhere: Secondary | ICD-10-CM | POA: Diagnosis not present

## 2023-12-17 DIAGNOSIS — R7881 Bacteremia: Secondary | ICD-10-CM | POA: Diagnosis not present

## 2023-12-17 LAB — COMPREHENSIVE METABOLIC PANEL WITH GFR
ALT: 15 U/L (ref 0–44)
AST: 24 U/L (ref 15–41)
Albumin: 2.6 g/dL — ABNORMAL LOW (ref 3.5–5.0)
Alkaline Phosphatase: 38 U/L (ref 38–126)
Anion gap: 7 (ref 5–15)
BUN: 11 mg/dL (ref 8–23)
CO2: 26 mmol/L (ref 22–32)
Calcium: 7.8 mg/dL — ABNORMAL LOW (ref 8.9–10.3)
Chloride: 106 mmol/L (ref 98–111)
Creatinine, Ser: 0.87 mg/dL (ref 0.61–1.24)
GFR, Estimated: >60 mL/min (ref >60)
Glucose, Bld: 90 mg/dL (ref 70–99)
Potassium: 3.4 mmol/L — ABNORMAL LOW (ref 3.5–5.1)
Sodium: 139 mmol/L (ref 135–145)
Total Bilirubin: 0.8 mg/dL (ref 0.0–1.2)
Total Protein: 5.1 g/dL — ABNORMAL LOW (ref 6.5–8.1)

## 2023-12-17 LAB — CULTURE, BLOOD (ROUTINE X 2): Special Requests: ADEQUATE

## 2023-12-17 LAB — C-REACTIVE PROTEIN: CRP: 0.5 mg/dL (ref <1.0)

## 2023-12-17 LAB — CBC
HCT: 31.9 % — ABNORMAL LOW (ref 39.0–52.0)
Hemoglobin: 10.6 g/dL — ABNORMAL LOW (ref 13.0–17.0)
MCH: 33 pg (ref 26.0–34.0)
MCHC: 33.2 g/dL (ref 30.0–36.0)
MCV: 99.4 fL (ref 80.0–100.0)
Platelets: 103 10*3/uL — ABNORMAL LOW (ref 150–400)
RBC: 3.21 MIL/uL — ABNORMAL LOW (ref 4.22–5.81)
RDW: 13.4 % (ref 11.5–15.5)
WBC: 3.8 10*3/uL — ABNORMAL LOW (ref 4.0–10.5)
nRBC: 0 % (ref 0.0–0.2)

## 2023-12-17 LAB — MAGNESIUM: Magnesium: 1.6 mg/dL — ABNORMAL LOW (ref 1.7–2.4)

## 2023-12-17 MED ORDER — MAGNESIUM SULFATE 4 GM/100ML IV SOLN
4.0000 g | Freq: Once | INTRAVENOUS | Status: AC
  Administered 2023-12-17: 4 g via INTRAVENOUS
  Filled 2023-12-17: qty 100

## 2023-12-17 MED ORDER — POTASSIUM CHLORIDE CRYS ER 20 MEQ PO TBCR
40.0000 meq | EXTENDED_RELEASE_TABLET | Freq: Once | ORAL | Status: AC
  Administered 2023-12-17: 40 meq via ORAL
  Filled 2023-12-17: qty 2

## 2023-12-17 MED ORDER — OXYCODONE HCL 5 MG PO TABS: 5.0000 mg | ORAL_TABLET | Freq: Four times a day (QID) | ORAL | 0 refills | Status: AC | PRN

## 2023-12-17 MED ORDER — CEFADROXIL 500 MG PO CAPS: 1000.0000 mg | ORAL_CAPSULE | Freq: Two times a day (BID) | ORAL | 0 refills | Status: AC

## 2023-12-17 NOTE — Telephone Encounter (Signed)
 Post ED Visit - Positive Culture Follow-up  Culture report reviewed by antimicrobial stewardship pharmacist: [facility] []  Court Distance, Pharm.D. []  Skeet Duke, Pharm.D., BCPS AQ-ID []  Leslee Rase, Pharm.D., BCPS []  Garland Junk, Pharm.D., BCPS []  [place], [place].D., BCPS, AAHIVP []  Alcide Aly, Pharm.D., BCPS, AAHIVP []  Jerri Morale, PharmD, BCPS []  Graham Laws, PharmD, BCPS []  Cleda Curly, PharmD, BCPS []  Tamar Fairly, PharmD []  Ballard Levels, PharmD, BCPS [x]  Dionicio Fray, PharmD  [facility] []  Arlyne Bering, PharmD []  Sherryle Don, PharmD []  Van Gelinas, PharmD []  Delila Felty, Rph []  Luna Salinas) Cleora Daft, PharmD []  Augustina Block, PharmD []  Arie Kurtz, PharmD []  Sharlyn Deaner, PharmD []  Agnes Hose, PharmD []  Kendall Pauls, PharmD []  Gladstone Lamer, PharmD []  Armanda Bern, PharmD []  Tera Fellows, PharmD   Positive blood culture Pt currently admitted for same. No action needed Georgine Kitchens 12/17/2023, 2:06 PM

## 2023-12-17 NOTE — TOC Transition Note (Signed)
 Transition of Care [facility]) - Discharge Note   Patient Details  Name: Taylor Myers MRN: [id number] Date of Birth: [birth year]  [facility] CM/SW Contact:  Jannine Meo, RN Phone Number: 12/17/2023, 9:40 AM   Clinical Narrative:   Secure message received from provider that patient will be discharged today and will need ambulance transportation home. PTAR forms completed and sent to unit. Awaiting discharge orders to finalize and will call PTAR once completed.    Final next level of care: Home/Self Care Barriers to Discharge: No Barriers Identified   Patient Goals and CMS Choice            Discharge Placement                       Discharge Plan and Services Additional resources added to the After Visit Summary for                                       Social Drivers of Health (SDOH) Interventions SDOH Screenings   Food Insecurity: No Food Insecurity (12/15/2023)  Housing: Low Risk  (12/15/2023)  Transportation Needs: No Transportation Needs (12/15/2023)  Utilities: Not At Risk (12/15/2023)  Physical Activity: Inactive (08/02/2023)  Social Connections: Unknown (12/15/2023)  Tobacco Use: Medium Risk (12/14/2023)     Readmission Risk Interventions    12/15/2023    3:57 PM  Readmission Risk Prevention Plan  Transportation Screening Complete  PCP or Specialist Appt within 3-5 Days Complete  HRI or Home Care Consult Complete  Social Work Consult for Recovery Care Planning/Counseling Complete  Palliative Care Screening Not Applicable  Medication Review Oceanographer) Referral to Pharmacy

## 2023-12-17 NOTE — Progress Notes (Signed)
 Pt discharged to home. Left unit on stretcher in the care of PTAR. Left in stable condition.

## 2023-12-17 NOTE — Progress Notes (Signed)
 ED Antimicrobial Stewardship Positive Culture Follow Up   Taylor Myers is an 73 y.o. male who presented to [facility] on @ADMITDT @ with a chief complaint of  Chief Complaint  Patient presents with   Rash    Recent Results (from the past 720 hours)  Blood culture (routine x 2)     Status: Abnormal   Collection Time: 12/12/23  8:46 PM   Specimen: BLOOD  Result Value Ref Range Status   Specimen Description   Final    BLOOD Performed at [facility], [address], [place], [place] [address]    Special Requests   Final    NONE Performed at [facility], [address], [place], [place] [address]    Culture  Setup Time   Final    GRAM POSITIVE COCCI ANAEROBIC BOTTLE ONLY CRITICAL VALUE NOTED.  VALUE IS CONSISTENT WITH PREVIOUSLY REPORTED AND CALLED VALUE.    Culture (A)  Final    STAPHYLOCOCCUS EPIDERMIDIS THE SIGNIFICANCE OF ISOLATING THIS ORGANISM FROM A SINGLE SET OF BLOOD CULTURES WHEN MULTIPLE SETS ARE DRAWN IS UNCERTAIN. PLEASE NOTIFY THE MICROBIOLOGY DEPARTMENT WITHIN ONE WEEK IF SPECIATION AND SENSITIVITIES ARE REQUIRED. Performed at [facility], [address], [place], [place] [address]    Report Status 12/16/2023 FINAL  Final  Blood culture (routine x 2)     Status: Abnormal   Collection Time: 12/12/23  8:51 PM   Specimen: BLOOD  Result Value Ref Range Status   Specimen Description   Final    BLOOD Performed at [facility], [address] [place], [place] [address]    Special Requests   Final    NONE Performed at [facility], [address], [place], [place] [address]    Culture  Setup Time   Final    GRAM POSITIVE COCCI ANAEROBIC BOTTLE ONLY CRITICAL RESULT CALLED TO, READ BACK BY AND VERIFIED WITH: G TATE CHARGE NURSE 12/14/2023 @ 0458 BY AB    Culture (A)  Final    STAPHYLOCOCCUS HOMINIS THE SIGNIFICANCE OF ISOLATING THIS ORGANISM FROM A SINGLE SET OF BLOOD CULTURES  WHEN MULTIPLE SETS ARE DRAWN IS UNCERTAIN. PLEASE NOTIFY THE MICROBIOLOGY DEPARTMENT WITHIN ONE WEEK IF SPECIATION AND SENSITIVITIES ARE REQUIRED. Performed at [facility], [address], [place], [place] [address]    Report Status 12/16/2023 FINAL  Final  Blood Culture ID Panel (Reflexed)     Status: Abnormal   Collection Time: 12/12/23  8:51 PM  Result Value Ref Range Status   Enterococcus faecalis NOT DETECTED NOT DETECTED Final   Enterococcus Faecium NOT DETECTED NOT DETECTED Final   Listeria monocytogenes NOT DETECTED NOT DETECTED Final   Staphylococcus species DETECTED (A) NOT DETECTED Final    Comment: CRITICAL RESULT CALLED TO, READ BACK BY AND VERIFIED WITH: G TATE CHARGE NURSE 12/14/2023 @ 0458 BY AB    Staphylococcus aureus (BCID) NOT DETECTED NOT DETECTED Final   Staphylococcus epidermidis NOT DETECTED NOT DETECTED Final   Staphylococcus lugdunensis NOT DETECTED NOT DETECTED Final   Streptococcus species NOT DETECTED NOT DETECTED Final   Streptococcus agalactiae NOT DETECTED NOT DETECTED Final   Streptococcus pneumoniae NOT DETECTED NOT DETECTED Final   Streptococcus pyogenes NOT DETECTED NOT DETECTED Final   A.calcoaceticus-baumannii NOT DETECTED NOT DETECTED Final   Bacteroides fragilis NOT DETECTED NOT DETECTED Final   Enterobacterales NOT DETECTED NOT DETECTED Final   Enterobacter cloacae complex NOT DETECTED NOT DETECTED Final   Escherichia coli NOT  DETECTED NOT DETECTED Final   Klebsiella aerogenes NOT DETECTED NOT DETECTED Final   Klebsiella oxytoca NOT DETECTED NOT DETECTED Final   Klebsiella pneumoniae NOT DETECTED NOT DETECTED Final   Proteus species NOT DETECTED NOT DETECTED Final   Salmonella species NOT DETECTED NOT DETECTED Final   Serratia marcescens NOT DETECTED NOT DETECTED Final   Haemophilus influenzae NOT DETECTED NOT DETECTED Final   Neisseria meningitidis NOT DETECTED NOT DETECTED Final   Pseudomonas aeruginosa NOT DETECTED NOT DETECTED Final    Stenotrophomonas maltophilia NOT DETECTED NOT DETECTED Final   Candida albicans NOT DETECTED NOT DETECTED Final   Candida auris NOT DETECTED NOT DETECTED Final   Candida glabrata NOT DETECTED NOT DETECTED Final   Candida krusei NOT DETECTED NOT DETECTED Final   Candida parapsilosis NOT DETECTED NOT DETECTED Final   Candida tropicalis NOT DETECTED NOT DETECTED Final   Cryptococcus neoformans/gattii NOT DETECTED NOT DETECTED Final    Comment: Performed at [facility], [address], [place], [place] [address]  Blood culture (routine x 2)     Status: Abnormal (Preliminary result)   Collection Time: 12/14/23  9:45 AM   Specimen: BLOOD  Result Value Ref Range Status   Specimen Description   Final    BLOOD FOREARM Performed at [facility], [address] [place], [place] [address]    Special Requests   Final    Blood Culture adequate volume Performed at [facility], [address] [place], [place] [address]    Culture  Setup Time   Final    GRAM POSITIVE COCCI IN BOTH AEROBIC AND ANAEROBIC BOTTLES CRITICAL VALUE NOTED.  VALUE IS CONSISTENT WITH PREVIOUSLY REPORTED AND CALLED VALUE.    Culture (A)  Final    STAPHYLOCOCCUS HAEMOLYTICUS THE SIGNIFICANCE OF ISOLATING THIS ORGANISM FROM A SINGLE SET OF BLOOD CULTURES WHEN MULTIPLE SETS ARE DRAWN IS UNCERTAIN. PLEASE NOTIFY THE MICROBIOLOGY DEPARTMENT WITHIN ONE WEEK IF SPECIATION AND SENSITIVITIES ARE REQUIRED. Performed at [facility], [address], [place], [place] [address]    Report Status PENDING  Incomplete  Blood culture (routine x 2)     Status: None (Preliminary result)   Collection Time: 12/14/23  9:45 AM   Specimen: BLOOD  Result Value Ref Range Status   Specimen Description   Final    BLOOD FATTY CASTS Performed at [facility], [address], [place], [place] [address]    Special Requests   Final    Blood Culture adequate volume Performed at [facility], [address], [place], [place] [address]    Culture   Final    NO GROWTH 3 DAYS Performed at [facility], [address], [place], [place] [address]    Report Status PENDING  Incomplete    Patient admitted for same. No action needed at this time.   Dionicio Fray, PharmD, BCPS 12/17/2023 10:00 AM ED Clinical Pharmacist -  [phone]

## 2023-12-17 NOTE — Progress Notes (Signed)
 Discharge instructions given to patient. All of patient's questions answered to his satisfaction. Awaiting PTAR for transport to home.

## 2023-12-17 NOTE — Discharge Summary (Addendum)
 Physician Discharge Summary   Patient: Taylor Myers MRN: [id number] DOB: 02-11-1951  Admit date:     12/14/2023  Discharge date: 12/17/23  Discharge Physician: Mickle Albe    PCP: Maryellen Snare, NP   Discharge Diagnoses: Principal Problem:   Bacteremia due to Staphylococcus  Resolved Problems:   * No resolved hospital problems. *  Hospital Course: 73 yo M evaluated for staph bacteremia in the setting of R gluteal/groin cellulitis.  Pt was treated with IV vancomycin  and IV fluids. He also received nystatin  powder. ID was consulted (please see Dr. Alva Auer note below):  Staph bacteremia == appears to be contaminant since 3 different isolates noted in various blood cx. Does not appear to be toxic. On exam does have mild cellulitis to abdominal fold which would warrant treatment. Recommend to finish out course with 4 addn days of cefadroxil or cephalexin to complete 7 day course. Candidal skin infection of groin and abdominal fold = continue with topical nystatin .   Pt was transitioned to duricef 1000 mg PO bid by ID. Pt will go home with script for duricef to complete the course at home. Electrolytes replaced as needed. F/u with PCP in 1 - 2 weeks.  Please note pt had Hypomagnesemia which required IV replacement.    DISCHARGE MEDICATION: Allergies as of 12/17/2023   No Known Allergies      Medication List     STOP taking these medications    doxycycline  100 MG capsule Commonly known as: VIBRAMYCIN        TAKE these medications    amLODipine  5 MG tablet Commonly known as: NORVASC  Take 1 tablet (5 mg total) by mouth daily.   apixaban  5 MG Tabs tablet Commonly known as: ELIQUIS  Take 1 tablet (5 mg total) by mouth 2 (two) times daily.   atorvastatin  40 MG tablet Commonly known as: LIPITOR Take 1 tablet (40 mg total) by mouth daily.   BEET ROOT PO Take 1 tablet by mouth at bedtime.   cefadroxil 500 MG capsule Commonly known as: DURICEF Take 2 capsules (1,000 mg  total) by mouth 2 (two) times daily for 4 days.   Magnesium  Oxide 400 MG Caps Take 1 capsule (400 mg total) by mouth daily. What changed: when to take this   MILK THISTLE PO Take 1 tablet by mouth at bedtime.   nystatin  cream Commonly known as: MYCOSTATIN  Apply to affected area 2 times daily   OVER THE COUNTER MEDICATION Take 1 capsule by mouth at bedtime. OTC supplement - liver and kidney cleanser   oxyCODONE  5 MG immediate release tablet Commonly known as: Oxy IR/ROXICODONE  Take 1 tablet (5 mg total) by mouth every 6 (six) hours as needed for up to 10 doses for severe pain (pain score 7-10).   TURMERIC CURCUMIN PO Take 1 tablet by mouth at bedtime.        Discharge Exam: Filed Weights   12/15/23 0631  Weight: 74 kg   Physical Exam HENT:     Head: Normocephalic.     Mouth/Throat:     Mouth: Mucous membranes are moist.  Cardiovascular:     Rate and Rhythm: Normal rate.  Pulmonary:     Effort: Pulmonary effort is normal.  Abdominal:     Palpations: Abdomen is soft.  Skin:    General: Skin is warm.  Neurological:     Mental Status: He is alert. Mental status is at baseline.  Psychiatric:        Mood and Affect: Mood normal.  Condition at discharge: fair  The results of significant diagnostics from this hospitalization (including imaging, microbiology, ancillary and laboratory) are listed below for reference.   Imaging Studies: ECHOCARDIOGRAM COMPLETE Result Date: 12/15/2023    ECHOCARDIOGRAM REPORT   Patient Name:   Taylor Myers Date of Exam: 12/15/2023 Medical Rec #:  [id number]     Height:       69.0 in Accession #:    [id number]    Weight:       163.1 lb Date of Birth:  05/20/1951    BSA:          1.895 m Patient Age:    72 years      BP:           109/83 mmHg Patient Gender: M             HR:           90 bpm. Exam Location:  Inpatient Procedure: 2D Echo, Cardiac Doppler and Color Doppler (Both Spectral and Color            Flow Doppler were utilized  during procedure). Indications:    Bacteremia  History:        Patient has prior history of Echocardiogram examinations, most                 recent 06/29/2023. ETOH abuse; Risk Factors:Hypertension,                 Diabetes and Former Smoker.  Sonographer:    Adelia Homestead RVT RCS Referring Phys: Jaleya Pebley IMPRESSIONS  1. Left ventricular ejection fraction, by estimation, is 55 to 60%. The left ventricle has normal function. The left ventricle has no regional wall motion abnormalities. There is mild concentric left ventricular hypertrophy. Left ventricular diastolic function could not be evaluated.  2. Right ventricular systolic function is normal. The right ventricular size is normal.  3. Left atrial size was moderately dilated.  4. Right atrial size was moderately dilated.  5. The mitral valve is normal in structure. Trivial mitral valve regurgitation. No evidence of mitral stenosis.  6. By gradients, aortic stensosis is very mild but visually there appears to be at least moderate AS. Suspect component of LF/LG AS. Aaron Aas The aortic valve is tricuspid. There is moderate calcification of the aortic valve. Aortic valve regurgitation is trivial. Mild to moderate aortic valve stenosis. Aortic valve area, by VTI measures 1.56 cm. Aortic valve mean gradient measures 4.4 mmHg. Aortic valve Vmax measures 1.43 m/s.  7. Aortic dilatation noted. There is mild dilatation of the ascending aorta, measuring 40 mm. There is borderline dilatation of the aortic root, measuring 38 mm.  8. The inferior vena cava is normal in size with greater than 50% respiratory variability, suggesting right atrial pressure of 3 mmHg. FINDINGS  Left Ventricle: Left ventricular ejection fraction, by estimation, is 55 to 60%. The left ventricle has normal function. The left ventricle has no regional wall motion abnormalities. The left ventricular internal cavity size was normal in size. There is  mild concentric left ventricular hypertrophy. Left  ventricular diastolic function could not be evaluated due to atrial fibrillation. Left ventricular diastolic function could not be evaluated. Right Ventricle: The right ventricular size is normal. No increase in right ventricular wall thickness. Right ventricular systolic function is normal. Left Atrium: Left atrial size was moderately dilated. Right Atrium: Right atrial size was moderately dilated. Pericardium: There is no evidence of pericardial effusion. Mitral Valve: The mitral valve  is normal in structure. Trivial mitral valve regurgitation. No evidence of mitral valve stenosis. Tricuspid Valve: The tricuspid valve is normal in structure. Tricuspid valve regurgitation is trivial. No evidence of tricuspid stenosis. Aortic Valve: By gradients, aortic stensosis is very mild but visually there appears to be at least moderate AS. Suspect component of LF/LG AS. The aortic valve is tricuspid. There is moderate calcification of the aortic valve. Aortic valve regurgitation  is trivial. Mild to moderate aortic stenosis is present. Aortic valve mean gradient measures 4.4 mmHg. Aortic valve peak gradient measures 8.2 mmHg. Aortic valve area, by VTI measures 1.56 cm. Pulmonic Valve: The pulmonic valve was normal in structure. Pulmonic valve regurgitation is trivial. No evidence of pulmonic stenosis. Aorta: Aortic dilatation noted. There is mild dilatation of the ascending aorta, measuring 40 mm. There is borderline dilatation of the aortic root, measuring 38 mm. Venous: The inferior vena cava is normal in size with greater than 50% respiratory variability, suggesting right atrial pressure of 3 mmHg. IAS/Shunts: No atrial level shunt detected by color flow Doppler.  LEFT VENTRICLE PLAX 2D LVIDd:         5.60 cm   Diastology LVIDs:         3.80 cm   LV e' medial:    7.62 cm/s LV PW:         1.20 cm   LV E/e' medial:  9.9 LV IVS:        1.20 cm   LV e' lateral:   11.20 cm/s LVOT diam:     2.10 cm   LV E/e' lateral: 6.7 LV SV:          37 LV SV Index:   19 LVOT Area:     3.46 cm  RIGHT VENTRICLE            IVC RV Basal diam:  4.20 cm    IVC diam: 1.60 cm RV Mid diam:    3.40 cm RV S prime:     9.65 cm/s LEFT ATRIUM             Index        RIGHT ATRIUM           Index LA diam:        4.90 cm 2.59 cm/m   RA Area:     19.00 cm LA Vol (A2C):   82.9 ml 43.75 ml/m  RA Volume:   48.30 ml  25.49 ml/m LA Vol (A4C):   63.2 ml 33.33 ml/m LA Biplane Vol: 86.6 ml 45.71 ml/m  AORTIC VALVE                    PULMONIC VALVE AV Area (Vmax):    1.61 cm     PV Vmax:       0.77 m/s AV Area (Vmean):   1.60 cm     PV Peak grad:  2.4 mmHg AV Area (VTI):     1.56 cm AV Vmax:           143.00 cm/s AV Vmean:          97.200 cm/s AV VTI:            0.235 m AV Peak Grad:      8.2 mmHg AV Mean Grad:      4.4 mmHg LVOT Vmax:         66.47 cm/s LVOT Vmean:        44.867 cm/s LVOT VTI:  0.106 m LVOT/AV VTI ratio: 0.45  AORTA Ao Root diam: 3.80 cm Ao Asc diam:  4.00 cm MITRAL VALVE MV Area (PHT): 3.77 cm    SHUNTS MV Decel Time: 201 msec    Systemic VTI:  0.11 m MV E velocity: 75.55 cm/s  Systemic Diam: 2.10 cm MV A velocity: 25.95 cm/s MV E/A ratio:  2.91 Jules Oar MD Electronically signed by Jules Oar MD Signature Date/Time: 12/15/2023/1:24:35 PM    Final    CT ABDOMEN PELVIS W CONTRAST Result Date: 12/13/2023 CLINICAL DATA:  Acute nonlocalized abdominal pain. Perirectal abscess. Rash in groin and lower abdomen for 3 days. EXAM: CT ABDOMEN AND PELVIS WITH CONTRAST TECHNIQUE: Multidetector CT imaging of the abdomen and pelvis was performed using the standard protocol following bolus administration of intravenous contrast. RADIATION DOSE REDUCTION: This exam was performed according to the departmental dose-optimization program which includes automated exposure control, adjustment of the mA and/or kV according to patient size and/or use of iterative reconstruction technique. CONTRAST:  OMNIPAQUE  IOHEXOL  300 MG/ML  SOLN COMPARISON:   CT abdomen pelvis 05/19/2021 FINDINGS: Lower chest: No acute abnormality. Cardiomegaly. Coronary artery atherosclerotic calcification. Hepatobiliary: Marked hepatic steatosis. Gallbladder and biliary tree are unremarkable. Pancreas: Atrophic.  No acute abnormality. Spleen: Unremarkable. Adrenals/Urinary Tract: Normal adrenal glands. Bilateral cortical renal scarring. Large cyst in the right kidney. No follow-up recommended. Nonobstructing right renal stone. No ureteral calculi or hydronephrosis. Unremarkable bladder. Stomach/Bowel: Normal caliber large and small bowel. No bowel wall thickening. Appendix is not visualized. No secondary signs of appendicitis. Stomach is within normal limits. No perirectal fluid collection or stranding. Vascular/Lymphatic: Aortic atherosclerosis. No enlarged abdominal or pelvic lymph nodes. Reproductive: Unremarkable. Other: No free intraperitoneal fluid or air. Musculoskeletal: No acute fracture. Right THA. Chronic compression fractures of T11, L4, and L5. Soft tissue thickening and mild stranding in the posterior right gluteal soft tissues posterior to the right ischial tuberosity. No evidence of osteomyelitis. IMPRESSION: 1. No acute abnormality in the abdomen or pelvis. 2. Soft tissue thickening and mild stranding in the posterior right gluteal soft tissues posterior to the right ischial tuberosity. Question developing ischial decubitus ulcer. 3. Marked hepatic steatosis. 4. Nonobstructing right renal stone. 5. Aortic Atherosclerosis (ICD10-I70.0). Electronically Signed   By: Rozell Cornet M.D.   On: 12/13/2023 02:06    Microbiology: Results for orders placed or performed during the hospital encounter of 12/14/23  Blood culture (routine x 2)     Status: Abnormal (Preliminary result)   Collection Time: 12/14/23  9:45 AM   Specimen: BLOOD  Result Value Ref Range Status   Specimen Description   Final    BLOOD FOREARM Performed at [facility], [address],  [place], [place] [address]    Special Requests   Final    Blood Culture adequate volume Performed at [facility], [address] [place], [place] [address]    Culture  Setup Time   Final    GRAM POSITIVE COCCI IN BOTH AEROBIC AND ANAEROBIC BOTTLES CRITICAL VALUE NOTED.  VALUE IS CONSISTENT WITH PREVIOUSLY REPORTED AND CALLED VALUE.    Culture (A)  Final    STAPHYLOCOCCUS HAEMOLYTICUS THE SIGNIFICANCE OF ISOLATING THIS ORGANISM FROM A SINGLE SET OF BLOOD CULTURES WHEN MULTIPLE SETS ARE DRAWN IS UNCERTAIN. PLEASE NOTIFY THE MICROBIOLOGY DEPARTMENT WITHIN ONE WEEK IF SPECIATION AND SENSITIVITIES ARE REQUIRED. Performed at [facility], [address], [place], [place] [address]    Report Status PENDING  Incomplete  Blood culture (  routine x 2)     Status: None (Preliminary result)   Collection Time: 12/14/23  9:45 AM   Specimen: BLOOD  Result Value Ref Range Status   Specimen Description   Final    BLOOD FATTY CASTS Performed at [facility], [address] [place], [place] [address]    Special Requests   Final    Blood Culture adequate volume Performed at [facility], [address] [place], [place] [address]    Culture   Final    NO GROWTH 3 DAYS Performed at [facility], [address], [place], [place] [address]    Report Status PENDING  Incomplete    Labs: CBC: Recent Labs  Lab 12/12/23 2142 12/14/23 0945 12/15/23 0531 12/16/23 0606 12/17/23 0549  WBC 8.4 6.6 4.3 3.5* 3.8*  NEUTROABS 7.2 5.2  --   --   --   HGB 13.4 12.9* 11.1* 11.2* 10.6*  HCT 38.7* 37.6* 32.7* 33.3* 31.9*  MCV 94.4 95.4 96.5 99.4 99.4  PLT 110* 100* 92* 98* 103*   Basic Metabolic Panel: Recent Labs  Lab 12/12/23 2142 12/14/23 0945 12/15/23 0531 12/16/23 0606 12/17/23 0549  NA 139 140 139 138 139  K 3.2* 3.4* 2.9* 3.7 3.4*  CL 98 99 101 104 106  CO2 30 26 29 26 26   GLUCOSE 104* 96 98 89 90  BUN 9 16 13 9 11    CREATININE 0.77 0.92 0.80 0.75 0.87  CALCIUM  8.6* 8.9 8.1* 8.1* 7.8*  MG  --   --  1.2* 1.9 1.6*   Liver Function Tests: Recent Labs  Lab 12/12/23 2142 12/14/23 0945 12/15/23 0531 12/16/23 0606 12/17/23 0549  AST 22 35 25 26 24   ALT 11 14 12 17 15   ALKPHOS 71 75 43 45 38  BILITOT 3.6* 1.7* 1.1 0.8 0.8  PROT 6.5 6.3* 5.3* 5.5* 5.1*  ALBUMIN 3.8 3.9 2.7* 2.7* 2.6*   CBG: No results for input(s): "GLUCAP" in the last 168 hours.  Discharge time spent: greater than 30 minutes.  Signed: Mechel Schutter , MD [facility] 12/17/2023

## 2023-12-17 NOTE — Plan of Care (Signed)

## 2023-12-19 LAB — CULTURE, BLOOD (ROUTINE X 2)
Culture: NO GROWTH
Special Requests: ADEQUATE

## 2023-12-19 NOTE — Progress Notes (Signed)
 [facility] Liaison Note  12/19/2023  Taylor Myers December 14, 1950 [id number]  Location: RN Hospital Liaison screened the patient remotely at [facility].  Insurance: Medicare ACO [employer]   Taylor Myers is a 73 y.o. male who is a Primary Care Patient of Dr[facility]). The patient was screened for readmission hospitalization with noted high risk score for unplanned readmission risk with 1 IP/1 ED in 6 months.  The patient was assessed for potential Care Management service needs for post hospital transition for care coordination. Review of patient's electronic medical record reveals patient was admitted for bacteriemia (abnormal labs).   Patient is currently active with Care Management for chronic disease management services.  Patient has been engaged by a Presenter, broadcasting.  Our community based plan of care has focused on disease management and community resource support. Liaison will collaborate with the VBCI team on pt's discharge disposition.  Patient will receive a post hospital call and will be evaluated for assessments and disease process education.   VBCI Care Management/Population Health does not replace or interfere with any arrangements made by the Inpatient Transition of Care team.   For questions contact:   Lilla Reichert, RN, BSN Hospital Liaison [place]   [facility], Population Health Office Hours MTWF  8:00 am-6:00 pm Direct Dial: [phone] mobile [email or web address]

## 2023-12-20 ENCOUNTER — Telehealth: Payer: Self-pay | Admitting: *Deleted

## 2023-12-20 NOTE — Progress Notes (Signed)
 Complex Care Management Care Guide Note  12/20/2023 Name: Taylor Myers MRN: [id number] DOB: 09-26-1950  Taylor Myers is a 73 y.o. year old male who is a primary care patient of Maryellen Snare, NP and is actively engaged with the care management team. I reached out to Taylor Myers by phone today to assist with scheduling  with the RN Case Manager.  Follow up plan: Unsuccessful telephone outreach attempt made. Phone number on file is not working.   Barnie Bora  [facility]  [facility], [facility] Guide  Direct Dial: [phone]  Fax [phone]

## 2023-12-30 ENCOUNTER — Ambulatory Visit (INDEPENDENT_AMBULATORY_CARE_PROVIDER_SITE_OTHER): Admitting: Podiatry

## 2023-12-30 DIAGNOSIS — M7751 Other enthesopathy of right foot: Secondary | ICD-10-CM

## 2023-12-30 DIAGNOSIS — M7752 Other enthesopathy of left foot: Secondary | ICD-10-CM

## 2023-12-30 NOTE — Progress Notes (Unsigned)
 Subjective:  Patient ID: Taylor Myers, male    DOB: 05/14/1951,  MRN: [id number]  Chief Complaint  Patient presents with   Nail Problem    73 y.o. male presents with the above complaint.  Patient presents with complaint of bilateral ankle pain that has been going for quite some time painful to touch is progressive gotten worse worse with ambulation is with pain scale 7 out of 10 dull aching nature worse with taking every step in the morning.  Feels very stiff.   Review of Systems: Negative except as noted in the HPI. Denies N/V/F/Ch.  Past Medical History:  Diagnosis Date   A-fib (HCC)    Alcohol  abuse, in remission    Arthritis    Asthma    child [place] asthma   History of kidney stones    "dormant kidney stone"   Hypertension    Wheelchair dependent     Current Outpatient Medications:    amLODipine  (NORVASC ) 5 MG tablet, Take 1 tablet (5 mg total) by mouth daily., Disp: 90 tablet, Rfl: 3   apixaban  (ELIQUIS ) 5 MG TABS tablet, Take 1 tablet (5 mg total) by mouth 2 (two) times daily., Disp: 90 tablet, Rfl: 3   atorvastatin  (LIPITOR) 40 MG tablet, Take 1 tablet (40 mg total) by mouth daily., Disp: 90 tablet, Rfl: 3   Magnesium  Oxide 400 MG CAPS, Take 1 capsule (400 mg total) by mouth daily. (Patient taking differently: Take 400 mg by mouth at bedtime.), Disp: , Rfl:    MILK THISTLE PO, Take 1 tablet by mouth at bedtime., Disp: , Rfl:    Misc Natural Products (BEET ROOT PO), Take 1 tablet by mouth at bedtime., Disp: , Rfl:    nystatin  cream (MYCOSTATIN ), Apply to affected area 2 times daily, Disp: 30 g, Rfl: 0   OVER THE COUNTER MEDICATION, Take 1 capsule by mouth at bedtime. OTC supplement - liver and kidney cleanser, Disp: , Rfl:    oxyCODONE  (OXY IR/ROXICODONE ) 5 MG immediate release tablet, Take 1 tablet (5 mg total) by mouth every 6 (six) hours as needed for up to 10 doses for severe pain (pain score 7-10)., Disp: 10 tablet, Rfl: 0   TURMERIC CURCUMIN PO, Take 1 tablet by  mouth at bedtime., Disp: , Rfl:   Social History   Tobacco Use  Smoking Status Former   Types: Cigarettes  Smokeless Tobacco Never    No Known Allergies Objective:  There were no vitals filed for this visit. There is no height or weight on file to calculate BMI. Constitutional Well developed. Well nourished.  Vascular Dorsalis pedis pulses palpable bilaterally. Posterior tibial pulses palpable bilaterally. Capillary refill normal to all digits.  No cyanosis or clubbing noted. Pedal hair growth normal.  Neurologic Normal speech. Oriented to person, place, and time. Epicritic sensation to light touch grossly present bilaterally.  Dermatologic Nails well groomed and normal in appearance. No open wounds. No skin lesions.  Orthopedic: Bilateral ankle pain with pain with range of motion of the ankle joint deep intra-articular pain noted pain at the medial lateral gutter of the ankle joint.  No pain at the Achilles tendon peroneal tendon ATFL ligament   Radiographs: None Assessment:   1. Capsulitis of right ankle   2. Capsulitis of left ankle    Plan:  Patient was evaluated and treated and all questions answered.  Bilateral ankle capsulitis - All questions and concerns were discussed with the patient in extensive detail given the amount of pain that  he is experiencing he will benefit from steroid injection to help decrease inflammatory component associate with pain.  Patient agrees with plan like to proceed with steroid injection -A steroid injection was performed at bilateral ankle joint using 1% plain Lidocaine  and 10 mg of Kenalog. This was well tolerated. - Shoe gear modification discussed   Return in about 3 months (around 03/31/2024) for RFC .

## 2024-01-28 ENCOUNTER — Emergency Department (HOSPITAL_COMMUNITY)

## 2024-01-28 ENCOUNTER — Other Ambulatory Visit: Payer: Self-pay

## 2024-01-28 ENCOUNTER — Emergency Department (HOSPITAL_COMMUNITY)
Admission: EM | Admit: 2024-01-28 | Discharge: 2024-01-29 | Disposition: A | Discharge status: Home / Self Care | Attending: Emergency Medicine | Admitting: Emergency Medicine

## 2024-01-28 ENCOUNTER — Encounter (HOSPITAL_COMMUNITY): Payer: Self-pay

## 2024-01-28 DIAGNOSIS — F10920 Alcohol use, unspecified with intoxication, uncomplicated: Secondary | ICD-10-CM

## 2024-01-28 DIAGNOSIS — E876 Hypokalemia: Secondary | ICD-10-CM | POA: Diagnosis not present

## 2024-01-28 DIAGNOSIS — Z79899 Other long term (current) drug therapy: Secondary | ICD-10-CM | POA: Insufficient documentation

## 2024-01-28 DIAGNOSIS — D72819 Decreased white blood cell count, unspecified: Secondary | ICD-10-CM | POA: Insufficient documentation

## 2024-01-28 DIAGNOSIS — S0003XA Contusion of scalp, initial encounter: Secondary | ICD-10-CM | POA: Insufficient documentation

## 2024-01-28 DIAGNOSIS — W19XXXA Unspecified fall, initial encounter: Secondary | ICD-10-CM

## 2024-01-28 DIAGNOSIS — S0990XA Unspecified injury of head, initial encounter: Secondary | ICD-10-CM | POA: Diagnosis present

## 2024-01-28 DIAGNOSIS — W0110XA Fall on same level from slipping, tripping and stumbling with subsequent striking against unspecified object, initial encounter: Secondary | ICD-10-CM | POA: Insufficient documentation

## 2024-01-28 DIAGNOSIS — Z7901 Long term (current) use of anticoagulants: Secondary | ICD-10-CM | POA: Diagnosis not present

## 2024-01-28 DIAGNOSIS — F10120 Alcohol abuse with intoxication, uncomplicated: Secondary | ICD-10-CM | POA: Diagnosis not present

## 2024-01-28 DIAGNOSIS — E119 Type 2 diabetes mellitus without complications: Secondary | ICD-10-CM | POA: Insufficient documentation

## 2024-01-28 DIAGNOSIS — I10 Essential (primary) hypertension: Secondary | ICD-10-CM | POA: Diagnosis not present

## 2024-01-28 DIAGNOSIS — Y908 Blood alcohol level of 240 mg/100 ml or more: Secondary | ICD-10-CM | POA: Diagnosis not present

## 2024-01-28 LAB — COMPREHENSIVE METABOLIC PANEL WITH GFR
ALT: 20 U/L (ref 0–44)
AST: 42 U/L — ABNORMAL HIGH (ref 15–41)
Albumin: 3.5 g/dL (ref 3.5–5.0)
Alkaline Phosphatase: 55 U/L (ref 38–126)
Anion gap: 11 (ref 5–15)
BUN: 10 mg/dL (ref 8–23)
CO2: 27 mmol/L (ref 22–32)
Calcium: 9.4 mg/dL (ref 8.9–10.3)
Chloride: 105 mmol/L (ref 98–111)
Creatinine, Ser: 0.81 mg/dL (ref 0.61–1.24)
GFR, Estimated: >60 mL/min (ref >60)
Glucose, Bld: 102 mg/dL — ABNORMAL HIGH (ref 70–99)
Potassium: 3.2 mmol/L — ABNORMAL LOW (ref 3.5–5.1)
Sodium: 143 mmol/L (ref 135–145)
Total Bilirubin: 0.6 mg/dL (ref 0.0–1.2)
Total Protein: 6.6 g/dL (ref 6.5–8.1)

## 2024-01-28 LAB — CBC
HCT: 39 % (ref 39.0–52.0)
Hemoglobin: 13.2 g/dL (ref 13.0–17.0)
MCH: 33.4 pg (ref 26.0–34.0)
MCHC: 33.8 g/dL (ref 30.0–36.0)
MCV: 98.7 fL (ref 80.0–100.0)
Platelets: 259 10*3/uL (ref 150–400)
RBC: 3.95 MIL/uL — ABNORMAL LOW (ref 4.22–5.81)
RDW: 13.1 % (ref 11.5–15.5)
WBC: 3.6 10*3/uL — ABNORMAL LOW (ref 4.0–10.5)
nRBC: 0 % (ref 0.0–0.2)

## 2024-01-28 LAB — PROTIME-INR
INR: 1.2 (ref 0.8–1.2)
Prothrombin Time: 15.3 s — ABNORMAL HIGH (ref 11.4–15.2)

## 2024-01-28 LAB — ETHANOL: Alcohol, Ethyl (B): 313 mg/dL (ref <15)

## 2024-01-28 LAB — I-STAT CHEM 8, ED
BUN: 12 mg/dL (ref 8–23)
Calcium, Ion: 1.1 mmol/L — ABNORMAL LOW (ref 1.15–1.40)
Chloride: 103 mmol/L (ref 98–111)
Creatinine, Ser: 1.2 mg/dL (ref 0.61–1.24)
Glucose, Bld: 98 mg/dL (ref 70–99)
HCT: 40 % (ref 39.0–52.0)
Hemoglobin: 13.6 g/dL (ref 13.0–17.0)
Potassium: 3.3 mmol/L — ABNORMAL LOW (ref 3.5–5.1)
Sodium: 143 mmol/L (ref 135–145)
TCO2: 25 mmol/L (ref 22–32)

## 2024-01-28 LAB — APTT: aPTT: 32 s (ref 24–36)

## 2024-01-28 LAB — SALICYLATE LEVEL: Salicylate Lvl: <7 mg/dL — ABNORMAL LOW (ref 7.0–30.0)

## 2024-01-28 MED ORDER — THIAMINE HCL 100 MG/ML IJ SOLN
100.0000 mg | Freq: Every day | INTRAMUSCULAR | Status: DC
  Administered 2024-01-28: 100 mg via INTRAVENOUS
  Filled 2024-01-28: qty 2

## 2024-01-28 MED ORDER — FENTANYL CITRATE PF 50 MCG/ML IJ SOSY
50.0000 ug | PREFILLED_SYRINGE | Freq: Once | INTRAMUSCULAR | Status: AC
  Administered 2024-01-28: 50 ug via INTRAVENOUS
  Filled 2024-01-28: qty 1

## 2024-01-28 MED ORDER — POTASSIUM CHLORIDE CRYS ER 20 MEQ PO TBCR
40.0000 meq | EXTENDED_RELEASE_TABLET | Freq: Once | ORAL | Status: AC
  Administered 2024-01-28: 40 meq via ORAL
  Filled 2024-01-28: qty 2

## 2024-01-28 MED ORDER — SODIUM CHLORIDE 0.9 % IV BOLUS
1000.0000 mL | Freq: Once | INTRAVENOUS | Status: AC
  Administered 2024-01-28: 1000 mL via INTRAVENOUS

## 2024-01-28 MED ORDER — ACETAMINOPHEN 325 MG PO TABS
650.0000 mg | ORAL_TABLET | Freq: Once | ORAL | Status: AC
  Administered 2024-01-28: 650 mg via ORAL
  Filled 2024-01-28: qty 2

## 2024-01-28 NOTE — Progress Notes (Signed)
   01/28/24 1833  Spiritual Encounters  Type of Visit Initial  Care provided to: Pt not available  Conversation partners present during encounter Nurse;Other (comment) (EMS)  Referral source Trauma page  Reason for visit Trauma  OnCall Visit Yes   Chaplain responded to a trauma in the ED - level II, fall on blood thinners. Per EMS, pt's father was at home and on the scene, and knows the patient is here at the hospital. No needs at this time. Spiritual care services available as needed.   Marion Sicilian, Chaplain 01/28/24

## 2024-01-28 NOTE — ED Notes (Signed)
 Ptar called

## 2024-01-28 NOTE — ED Triage Notes (Signed)
 Pt BIB [facility] from home for a fall backwards onto his wheelchair ramp. +ETOH. Did hit head but no LOC. Pt takes eliquis . Hematoma to L posterior scalp. VSS.

## 2024-01-28 NOTE — ED Provider Notes (Signed)
 [place] [facility] Provider Note   CSN: [id number] Arrival date & time: 01/28/24  1810     History  Chief Complaint  Patient presents with   Taylor Myers is a 73 y.o. male with past medical history significant for alcohol  abuse, hypertension, wheelchair dependence, gait difficulty, paroxysmal A-fib, diabetes who is on Eliquis  secondary to his paroxysmal A-fib who presents with fall backwards on wheelchair ramp.  Does endorse 3 shots of vodka prior to arrival.  Did hit head but denies loss of consciousness.  He has a hematoma on left posterior scalp.  Some deformity noted of his right ankle but he reports this is old.  Reports 5/10 generalized pain.   Fall       Home Medications Prior to Admission medications   Medication Sig Start Date End Date Taking? Authorizing Provider  amLODipine  (NORVASC ) 5 MG tablet Take 1 tablet (5 mg total) by mouth daily. 09/02/23 01/28/24  Wendie Hamburg, MD  apixaban  (ELIQUIS ) 5 MG TABS tablet Take 1 tablet (5 mg total) by mouth 2 (two) times daily. 09/02/23   Wendie Hamburg, MD  atorvastatin  (LIPITOR) 40 MG tablet Take 1 tablet (40 mg total) by mouth daily. 09/02/23 01/28/24  Wendie Hamburg, MD  Magnesium  Oxide 400 MG CAPS Take 1 capsule (400 mg total) by mouth daily. Patient taking differently: Take 400 mg by mouth at bedtime. 09/14/23   Wendie Hamburg, MD  MILK THISTLE PO Take 1 tablet by mouth at bedtime.    [provider]  Misc Natural Products (BEET ROOT PO) Take 1 tablet by mouth at bedtime.    [provider]  nystatin  cream (MYCOSTATIN ) Apply to affected area 2 times daily 12/12/23   Afton Horse T, DO  OVER THE COUNTER MEDICATION Take 1 capsule by mouth at bedtime. OTC supplement - liver and kidney cleanser    [provider]  oxyCODONE  (OXY IR/ROXICODONE ) 5 MG immediate release tablet Take 1 tablet (5 mg total) by mouth every 6 (six) hours  as needed for up to 10 doses for severe pain (pain score 7-10). 12/17/23   Sira, Zackery, MD  TURMERIC CURCUMIN PO Take 1 tablet by mouth at bedtime.    [provider]      Allergies    Patient has no known allergies.    Review of Systems   Review of Systems  All other systems reviewed and are negative.   Physical Exam Updated Vital Signs BP 111/75 (BP Location: Right Arm)   Pulse 71   Temp (!) 97.2 F (36.2 C) (Axillary)   Resp 18   Ht 5\' 9"  (1.753 m)   Wt 74 kg   SpO2 95%   BMI 24.09 kg/m  Physical Exam Vitals and nursing note reviewed.  Constitutional:      General: He is not in acute distress.    Appearance: Normal appearance.     Comments: Chronically ill-appearing, non toxic, non septic appearance  HENT:     Head: Normocephalic and atraumatic.  Eyes:     General:        Right eye: No discharge.        Left eye: No discharge.  Cardiovascular:     Rate and Rhythm: Normal rate and regular rhythm.     Heart sounds: No murmur heard.    No friction rub. No gallop.  Pulmonary:     Effort: Pulmonary effort is normal.  Breath sounds: Normal breath sounds.  Abdominal:     General: Bowel sounds are normal.     Palpations: Abdomen is soft.  Skin:    General: Skin is warm and dry.     Capillary Refill: Capillary refill takes less than 2 seconds.     Comments: Small hematoma to posterior scalp, no laceration requiring repair  Neurological:     Mental Status: He is alert and oriented to person, place, and time.     Comments: Response to questions appropriately, moves all 4 limbs spontaneously  Psychiatric:        Mood and Affect: Mood normal.        Behavior: Behavior normal.     ED Results / Procedures / Treatments   Labs (all labs ordered are listed, but only abnormal results are displayed) Labs Reviewed  CBC - Abnormal; Notable for the following components:      Result Value   WBC 3.6 (*)    RBC 3.95 (*)    All other components within normal  limits  COMPREHENSIVE METABOLIC PANEL WITH GFR - Abnormal; Notable for the following components:   Potassium 3.2 (*)    Glucose, Bld 102 (*)    AST 42 (*)    All other components within normal limits  ETHANOL - Abnormal; Notable for the following components:   Alcohol , Ethyl (B) 313 (*)    All other components within normal limits  SALICYLATE LEVEL - Abnormal; Notable for the following components:   Salicylate Lvl <7.0 (*)    All other components within normal limits  PROTIME-INR - Abnormal; Notable for the following components:   Prothrombin Time 15.3 (*)    All other components within normal limits  I-STAT CHEM 8, ED - Abnormal; Notable for the following components:   Potassium 3.3 (*)    Calcium , Ion 1.10 (*)    All other components within normal limits  APTT    EKG EKG Interpretation Date/Time:  Saturday January 28 2024 18:32:38 EDT Ventricular Rate:  71 PR Interval:    QRS Duration:  162 QT Interval:  429 QTC Calculation: 467 R Axis:   206  Text Interpretation: Atrial fibrillation Right bundle branch block No significant change since last tracing Confirmed by Celesta Coke ([id number]) on 01/28/2024 9:02:12 PM  Radiology CT Head Wo Contrast Result Date: 01/28/2024 CLINICAL DATA:  Fall.  Neck trauma, head trauma EXAM: CT HEAD WITHOUT CONTRAST CT CERVICAL SPINE WITHOUT CONTRAST TECHNIQUE: Multidetector CT imaging of the head and cervical spine was performed following the standard protocol without intravenous contrast. Multiplanar CT image reconstructions of the cervical spine were also generated. RADIATION DOSE REDUCTION: This exam was performed according to the departmental dose-optimization program which includes automated exposure control, adjustment of the mA and/or kV according to patient size and/or use of iterative reconstruction technique. COMPARISON:  CT head and cervical spine 08/15/2023 FINDINGS: CT HEAD FINDINGS Brain: No intracranial hemorrhage, mass effect, or evidence of  acute infarct. No hydrocephalus. No extra-axial fluid collection. Generalized cerebral atrophy and chronic small vessel ischemic disease. Vascular: No hyperdense vessel. Intracranial arterial calcification. Skull: No fracture or focal lesion. Sinuses/Orbits: No acute finding. Right-sided exophthalmos. Remote fracture of the left orbital floor. Paranasal sinuses are well aerated Other: None. CT CERVICAL SPINE FINDINGS Alignment: No evidence of traumatic malalignment. Skull base and vertebrae: No acute fracture. No primary bone lesion or focal pathologic process. Soft tissues and spinal canal: No prevertebral fluid or swelling. No visible canal hematoma. Disc levels: No change  from 08/15/2023 in large bulky bridging anterior osteophytes from C3-C6. Advanced disc space height loss and degenerative endplate changes at C7-T1. Multilevel facet arthropathy with ankylosis of the left C4-C5 facets. Posterior disc osteophyte complexes cause moderate effacement of the ventral thecal sac at C3-C4. Upper chest: No acute abnormality. Other: Carotid calcification. IMPRESSION: 1. No acute intracranial abnormality. 2. No acute fracture in the cervical spine. Electronically Signed   By: Rozell Cornet M.D.   On: 01/28/2024 19:17   CT Cervical Spine Wo Contrast Result Date: 01/28/2024 CLINICAL DATA:  Fall.  Neck trauma, head trauma EXAM: CT HEAD WITHOUT CONTRAST CT CERVICAL SPINE WITHOUT CONTRAST TECHNIQUE: Multidetector CT imaging of the head and cervical spine was performed following the standard protocol without intravenous contrast. Multiplanar CT image reconstructions of the cervical spine were also generated. RADIATION DOSE REDUCTION: This exam was performed according to the departmental dose-optimization program which includes automated exposure control, adjustment of the mA and/or kV according to patient size and/or use of iterative reconstruction technique. COMPARISON:  CT head and cervical spine 08/15/2023 FINDINGS: CT  HEAD FINDINGS Brain: No intracranial hemorrhage, mass effect, or evidence of acute infarct. No hydrocephalus. No extra-axial fluid collection. Generalized cerebral atrophy and chronic small vessel ischemic disease. Vascular: No hyperdense vessel. Intracranial arterial calcification. Skull: No fracture or focal lesion. Sinuses/Orbits: No acute finding. Right-sided exophthalmos. Remote fracture of the left orbital floor. Paranasal sinuses are well aerated Other: None. CT CERVICAL SPINE FINDINGS Alignment: No evidence of traumatic malalignment. Skull base and vertebrae: No acute fracture. No primary bone lesion or focal pathologic process. Soft tissues and spinal canal: No prevertebral fluid or swelling. No visible canal hematoma. Disc levels: No change from 08/15/2023 in large bulky bridging anterior osteophytes from C3-C6. Advanced disc space height loss and degenerative endplate changes at C7-T1. Multilevel facet arthropathy with ankylosis of the left C4-C5 facets. Posterior disc osteophyte complexes cause moderate effacement of the ventral thecal sac at C3-C4. Upper chest: No acute abnormality. Other: Carotid calcification. IMPRESSION: 1. No acute intracranial abnormality. 2. No acute fracture in the cervical spine. Electronically Signed   By: Rozell Cornet M.D.   On: 01/28/2024 19:17   DG Pelvis Portable Result Date: 01/28/2024 CLINICAL DATA:  Status post fall. EXAM: PORTABLE PELVIS 1-2 VIEWS COMPARISON:  November 18, 2022 FINDINGS: There is no evidence of acute pelvic fracture or diastasis. An intact total right hip replacement is noted. Moderate severity degenerative changes are seen involving the left hip in the form of joint space narrowing, acetabular sclerosis and lateral acetabular bony spurring. No acute pelvic bone lesions are seen. IMPRESSION: 1. No acute osseous abnormality. 2. Intact total right hip replacement. 3. Moderate severity degenerative changes involving the left hip. Electronically Signed    By: Virgle Grime M.D.   On: 01/28/2024 18:48    Procedures Procedures    Medications Ordered in ED Medications  thiamine  (VITAMIN B1) injection 100 mg (100 mg Intravenous Given 01/28/24 1936)  acetaminophen  (TYLENOL ) tablet 650 mg (650 mg Oral Given 01/28/24 1912)  fentaNYL  (SUBLIMAZE ) injection 50 mcg (50 mcg Intravenous Given 01/28/24 1912)  potassium chloride  SA (KLOR-CON  M) CR tablet 40 mEq (40 mEq Oral Given 01/28/24 1936)  sodium chloride  0.9 % bolus 1,000 mL (1,000 mLs Intravenous New Bag/Given 01/28/24 1937)    ED Course/ Medical Decision Making/ A&P  Medical Decision Making  This patient is a 73 y.o. male  who presents to the ED for concern of fall, head injury, alcohol  intoxication.   Differential diagnoses prior to evaluation: The emergent differential diagnosis includes, but is not limited to,  epidural hematoma, subdural hematoma, skull fracture, subarachnoid hemorrhage, unstable cervical spine fracture, concussion vs other MSK injury  . This is not an exhaustive differential.   Past Medical History / Co-morbidities / Social History: significant for alcohol  abuse, hypertension, wheelchair dependence, gait difficulty, paroxysmal A-fib, diabetes who is on Eliquis  secondary to his paroxysmal A-fib  Additional history: Chart reviewed. Pertinent results include: reviewed labwork, imaging from previous ED visits, hospitalizations  Physical Exam: Physical exam performed. The pertinent findings include: clinically intoxicated, small posterior scalp hematoma, vital signs stable other than some hypertension, bp 141/94  Lab Tests/Imaging studies: I personally interpreted labs/imaging and the pertinent results include:  CBC with mild leukocytopenia WBC 3.6, CMP with mild hypokalemia, Tessman 3.2, mildly elevated AST at 42 consistent with alcohol  abuse.  His ethanol level is elevated at 313.  Salicylate unremarkable.  Mildly elevated PT, normal INR,  APTT..  Independent interpreted plain film radiograph of the pelvis, CT of the head and C-spine, overall with no evidence of acute intracranial or unstable cervical spinal pathology, unremarkable appearance of pelvis.  I agree with the radiologist interpretation.  Cardiac monitoring: EKG obtained and interpreted by myself and attending physician which shows: A-fib with normal rhythm   Medications: I ordered medication including fentanyl  for pain, fluid bolus, thiamine  for alcohol  use.  I have reviewed the patients home medicines and have made adjustments as needed.   Disposition: After consideration of the diagnostic results and the patients response to treatment, I feel that patient will need to metabolize his ethanol, otherwise stable for discharge after fall with no acute injury other than small hematoma posterior scalp.   emergency department workup does not suggest an emergent condition requiring admission or immediate intervention beyond what has been performed at this time. The plan is: as above. The patient is safe for discharge and has been instructed to return immediately for worsening symptoms, change in symptoms or any other concerns.  Final Clinical Impression(s) / ED Diagnoses Final diagnoses:  Fall, initial encounter  Injury of head, initial encounter  Alcoholic intoxication without complication [facility])    Rx / DC Orders ED Discharge Orders     None         Nelly Banco, PA-C 01/28/24 2322    Celesta Coke K, DO 01/29/24 1457

## 2024-01-28 NOTE — ED Notes (Signed)
 Discharge instructions reviewed with patient. Patient questions answered and opportunity for education reviewed. Patient voices understanding of discharge instructions with no further questions.

## 2024-01-28 NOTE — Discharge Instructions (Signed)
 I recommend no longer drinking alcohol  so that you do not have any repeated falls.  You may want to reach out to the resources I provided above for help with quitting drinking.

## 2024-01-28 NOTE — ED Notes (Signed)
 Trauma Response Nurse Documentation  Taylor Myers is a 73 y.o. male arriving to [facility] ED via EMS  On Eliquis  (apixaban ) daily. Trauma was activated as a Level 2 based on the following trauma criteria Elderly patients > 65 with head trauma on anti-coagulation (excluding ASA).  Patient cleared for CT by Dr. Nora Beal. Pt transported to CT with trauma response nurse present to monitor. RN remained with the patient throughout their absence from the department for clinical observation.   GCS 15.  History   Past Medical History:  Diagnosis Date   A-fib (HCC)    Alcohol  abuse, in remission    Arthritis    Asthma    child hood asthma   History of kidney stones    "dormant kidney stone"   Hypertension    Wheelchair dependent      Past Surgical History:  Procedure Laterality Date   ANKLE SURGERY Right    APPLICATION OF WOUND VAC Right 03/19/2022   Procedure: APPLICATION OF WOUND VAC;  Surgeon: Wes Hamman, MD;  Location: [facility] OR;  Service: Orthopedics;  Laterality: Right;   HIP SURGERY Right    total replacement   KNEE SURGERY     bilateral replacements   ORIF HUMERUS FRACTURE Right 03/19/2022   Procedure: OPEN REDUCTION INTERNAL FIXATION (ORIF) RIGHT DISTAL HUMERUS FRACTURE;  Surgeon: Wes Hamman, MD;  Location: [facility] OR;  Service: Orthopedics;  Laterality: Right;     Initial Focused Assessment (If applicable, or please see trauma documentation): Patient alert and oriented x4, GCS 15, PERR 3 Airway intact, bilateral breath sounds Pulses 2+  CT's Completed:   CT Head and CT C-Spine   Interventions:  IV, labs CXR/PXR CT Head/Cspine Tylenol  Fentanyl   Plan for disposition:  Discharge home   Event Summary: Patient to ED after fall backwards from wheelchair. Patient unsure if he hit his head, no LOC. Patient is on Eliquis  for Afib. Patient says his joints "hurt all over"". Imaging was ordered and revealed no traumatic injury. Patient will be discharged once able. Lives  with his father.   Bedside handoff with ED RN Orion Birks.    Henri Loft Larena Ohnemus  Trauma Response RN  Please call TRN at [phone] for further assistance.

## 2024-01-28 NOTE — ED Notes (Signed)
 Trauma Event Note    Assumed care from [facility] at shift change. Pt presents via EMS after a fall backwrds, hematoma to posterior head. Reportedly drinks 3 martinis a day, ETOH 313 on arrival. Trauma scans negative. Anticipate d/c when clinically sober.  Last imported Vital Signs BP 101/62   Pulse 87   Temp 98.5 F (36.9 C)   Resp 13   Ht 5\' 9"  (1.753 m)   Wt 163 lb 2.3 oz (74 kg)   SpO2 91%   BMI 24.09 kg/m   Trending CBC Recent Labs    01/28/24 1823 01/28/24 1830  WBC 3.6*  --   HGB 13.2 13.6  HCT 39.0 40.0  PLT 259  --     Trending Coag's Recent Labs    01/28/24 1823  APTT 32  INR 1.2    Trending BMET Recent Labs    01/28/24 1823 01/28/24 1830  NA 143 143  K 3.2* 3.3*  CL 105 103  CO2 27  --   BUN 10 12  CREATININE 0.81 1.20  GLUCOSE 102* 98      Nekia Maxham O Symon Norwood  Trauma Response RN  Please call TRN at [phone] for further assistance.

## 2024-01-28 NOTE — ED Notes (Signed)
 Prosperi, MD at bedside.

## 2024-01-28 NOTE — Progress Notes (Signed)
 Orthopedic Tech Progress Note Patient Details:  YAZID POP 03/17/1951 [id number]  Level II trauma, no ortho tech orders at this time.  Patient ID: SEANN GENTHER, male   DOB: 1951-07-21, 73 y.o.   MRN: [id number]  Cozette Divine 01/28/2024, 6:11 PM

## 2024-04-05 ENCOUNTER — Encounter: Payer: Self-pay | Admitting: Podiatry

## 2024-04-05 ENCOUNTER — Ambulatory Visit (INDEPENDENT_AMBULATORY_CARE_PROVIDER_SITE_OTHER): Admitting: Podiatry

## 2024-04-05 DIAGNOSIS — B351 Tinea unguium: Secondary | ICD-10-CM | POA: Diagnosis not present

## 2024-04-05 DIAGNOSIS — M79674 Pain in right toe(s): Secondary | ICD-10-CM | POA: Diagnosis not present

## 2024-04-05 DIAGNOSIS — M79675 Pain in left toe(s): Secondary | ICD-10-CM | POA: Diagnosis not present

## 2024-04-05 NOTE — Progress Notes (Signed)
 This patient returns to my office for at risk foot care.  This patient requires this care by a professional since this patient will be at risk due to having diabetes and coagulation defect due to eliquis . This patient is unable to cut nails himself since the patient cannot reach his nails.These nails are painful walking and wearing shoes. Patient was seen in a chair but came in a wheelchair.  This patient presents for at risk foot care today.  General Appearance  Alert, conversant and in no acute stress.  Vascular  Dorsalis pedis and posterior tibial  pulses are palpable  bilaterally.  Capillary return is within normal limits  bilaterally. Temperature is within normal limits  bilaterally.  Neurologic  Senn-Weinstein monofilament wire test within normal limits  bilaterally. Muscle power within normal limits bilaterally.  Nails Thick disfigured discolored nails with subungual debris  from hallux to fifth toes bilaterally. No evidence of bacterial infection or drainage bilaterally.  Orthopedic  No limitations of motion  feet .  No crepitus or effusions noted.  No bony pathology or digital deformities noted.  Skin  normotropic skin with no porokeratosis noted bilaterally.  No signs of infections or ulcers noted.     Onychomycosis  Pain in right toes  Pain in left toes  Consent was obtained for treatment procedures.   Mechanical debridement of nails 1-5  bilaterally performed with a nail nipper.  Filed with dremel without incident.    Return office visit     3 months                 Told patient to return for periodic foot care and evaluation due to potential at risk complications.   Cordella Bold DPM

## 2024-05-26 ENCOUNTER — Other Ambulatory Visit: Payer: Self-pay

## 2024-05-26 ENCOUNTER — Emergency Department (HOSPITAL_COMMUNITY)
Admission: EM | Admit: 2024-05-26 | Discharge: 2024-05-26 | Disposition: A | Discharge status: Home / Self Care | Attending: Emergency Medicine | Admitting: Emergency Medicine

## 2024-05-26 ENCOUNTER — Encounter (HOSPITAL_COMMUNITY): Payer: Self-pay

## 2024-05-26 ENCOUNTER — Emergency Department (HOSPITAL_COMMUNITY)

## 2024-05-26 DIAGNOSIS — I4891 Unspecified atrial fibrillation: Secondary | ICD-10-CM | POA: Insufficient documentation

## 2024-05-26 DIAGNOSIS — I4510 Unspecified right bundle-branch block: Secondary | ICD-10-CM | POA: Diagnosis not present

## 2024-05-26 DIAGNOSIS — R220 Localized swelling, mass and lump, head: Secondary | ICD-10-CM | POA: Insufficient documentation

## 2024-05-26 DIAGNOSIS — W19XXXA Unspecified fall, initial encounter: Secondary | ICD-10-CM

## 2024-05-26 DIAGNOSIS — W050XXA Fall from non-moving wheelchair, initial encounter: Secondary | ICD-10-CM | POA: Insufficient documentation

## 2024-05-26 DIAGNOSIS — F10920 Alcohol use, unspecified with intoxication, uncomplicated: Secondary | ICD-10-CM

## 2024-05-26 DIAGNOSIS — M160 Bilateral primary osteoarthritis of hip: Secondary | ICD-10-CM | POA: Insufficient documentation

## 2024-05-26 DIAGNOSIS — Y908 Blood alcohol level of 240 mg/100 ml or more: Secondary | ICD-10-CM | POA: Diagnosis not present

## 2024-05-26 DIAGNOSIS — Z7901 Long term (current) use of anticoagulants: Secondary | ICD-10-CM | POA: Diagnosis not present

## 2024-05-26 DIAGNOSIS — Z993 Dependence on wheelchair: Secondary | ICD-10-CM | POA: Diagnosis not present

## 2024-05-26 DIAGNOSIS — M4312 Spondylolisthesis, cervical region: Secondary | ICD-10-CM | POA: Diagnosis not present

## 2024-05-26 DIAGNOSIS — W1830XA Fall on same level, unspecified, initial encounter: Secondary | ICD-10-CM | POA: Insufficient documentation

## 2024-05-26 DIAGNOSIS — Z96641 Presence of right artificial hip joint: Secondary | ICD-10-CM | POA: Insufficient documentation

## 2024-05-26 DIAGNOSIS — M4802 Spinal stenosis, cervical region: Secondary | ICD-10-CM | POA: Diagnosis not present

## 2024-05-26 DIAGNOSIS — I10 Essential (primary) hypertension: Secondary | ICD-10-CM | POA: Diagnosis not present

## 2024-05-26 DIAGNOSIS — M47812 Spondylosis without myelopathy or radiculopathy, cervical region: Secondary | ICD-10-CM | POA: Diagnosis not present

## 2024-05-26 DIAGNOSIS — Z87440 Personal history of urinary (tract) infections: Secondary | ICD-10-CM | POA: Diagnosis not present

## 2024-05-26 DIAGNOSIS — N200 Calculus of kidney: Secondary | ICD-10-CM | POA: Insufficient documentation

## 2024-05-26 DIAGNOSIS — M542 Cervicalgia: Secondary | ICD-10-CM | POA: Insufficient documentation

## 2024-05-26 DIAGNOSIS — J90 Pleural effusion, not elsewhere classified: Secondary | ICD-10-CM | POA: Insufficient documentation

## 2024-05-26 DIAGNOSIS — Z79899 Other long term (current) drug therapy: Secondary | ICD-10-CM | POA: Insufficient documentation

## 2024-05-26 DIAGNOSIS — H0520 Unspecified exophthalmos: Secondary | ICD-10-CM | POA: Diagnosis not present

## 2024-05-26 DIAGNOSIS — G8929 Other chronic pain: Secondary | ICD-10-CM | POA: Insufficient documentation

## 2024-05-26 DIAGNOSIS — E872 Acidosis, unspecified: Secondary | ICD-10-CM | POA: Diagnosis not present

## 2024-05-26 DIAGNOSIS — F10129 Alcohol abuse with intoxication, unspecified: Secondary | ICD-10-CM | POA: Diagnosis not present

## 2024-05-26 DIAGNOSIS — L98419 Non-pressure chronic ulcer of buttock with unspecified severity: Secondary | ICD-10-CM | POA: Diagnosis not present

## 2024-05-26 DIAGNOSIS — R42 Dizziness and giddiness: Secondary | ICD-10-CM | POA: Diagnosis present

## 2024-05-26 LAB — URINALYSIS, ROUTINE W REFLEX MICROSCOPIC
Bilirubin Urine: NEGATIVE
Glucose, UA: NEGATIVE mg/dL
Hgb urine dipstick: NEGATIVE
Ketones, ur: NEGATIVE mg/dL
Leukocytes,Ua: NEGATIVE
Nitrite: NEGATIVE
Protein, ur: NEGATIVE mg/dL
Specific Gravity, Urine: 1.026 (ref 1.005–1.030)
pH: 5 (ref 5.0–8.0)

## 2024-05-26 LAB — BLOOD CULTURE ID PANEL (REFLEXED) - BCID2

## 2024-05-26 LAB — COMPREHENSIVE METABOLIC PANEL WITH GFR
ALT: 32 U/L (ref 0–44)
AST: 51 U/L — ABNORMAL HIGH (ref 15–41)
Albumin: 3.5 g/dL (ref 3.5–5.0)
Alkaline Phosphatase: 70 U/L (ref 38–126)
Anion gap: 14 (ref 5–15)
BUN: 9 mg/dL (ref 8–23)
CO2: 26 mmol/L (ref 22–32)
Calcium: 8.4 mg/dL — ABNORMAL LOW (ref 8.9–10.3)
Chloride: 102 mmol/L (ref 98–111)
Creatinine, Ser: 0.75 mg/dL (ref 0.61–1.24)
GFR, Estimated: >60 mL/min (ref >60)
Glucose, Bld: 96 mg/dL (ref 70–99)
Potassium: 3.5 mmol/L (ref 3.5–5.1)
Sodium: 142 mmol/L (ref 135–145)
Total Bilirubin: 1 mg/dL (ref 0.0–1.2)
Total Protein: 6.6 g/dL (ref 6.5–8.1)

## 2024-05-26 LAB — I-STAT CHEM 8, ED
BUN: 10 mg/dL (ref 8–23)
Calcium, Ion: 0.99 mmol/L — ABNORMAL LOW (ref 1.15–1.40)
Chloride: 103 mmol/L (ref 98–111)
Creatinine, Ser: 1.1 mg/dL (ref 0.61–1.24)
Glucose, Bld: 94 mg/dL (ref 70–99)
HCT: 39 % (ref 39.0–52.0)
Hemoglobin: 13.3 g/dL (ref 13.0–17.0)
Potassium: 3.5 mmol/L (ref 3.5–5.1)
Sodium: 144 mmol/L (ref 135–145)
TCO2: 25 mmol/L (ref 22–32)

## 2024-05-26 LAB — CBC
HCT: 38 % — ABNORMAL LOW (ref 39.0–52.0)
Hemoglobin: 12.5 g/dL — ABNORMAL LOW (ref 13.0–17.0)
MCH: 32.2 pg (ref 26.0–34.0)
MCHC: 32.9 g/dL (ref 30.0–36.0)
MCV: 97.9 fL (ref 80.0–100.0)
Platelets: 228 K/uL (ref 150–400)
RBC: 3.88 MIL/uL — ABNORMAL LOW (ref 4.22–5.81)
RDW: 12.9 % (ref 11.5–15.5)
WBC: 5 K/uL (ref 4.0–10.5)
nRBC: 0 % (ref 0.0–0.2)

## 2024-05-26 LAB — I-STAT CG4 LACTIC ACID, ED
Lactic Acid, Venous: 3.5 mmol/L (ref 0.5–1.9)
Lactic Acid, Venous: 3.8 mmol/L (ref 0.5–1.9)

## 2024-05-26 LAB — PROTIME-INR
INR: 1.1 (ref 0.8–1.2)
Prothrombin Time: 15.1 s (ref 11.4–15.2)

## 2024-05-26 LAB — ETHANOL: Alcohol, Ethyl (B): 293 mg/dL — ABNORMAL HIGH (ref <15)

## 2024-05-26 MED ORDER — LACTATED RINGERS IV BOLUS
1000.0000 mL | Freq: Once | INTRAVENOUS | Status: AC
  Administered 2024-05-26: 1000 mL via INTRAVENOUS

## 2024-05-26 MED ORDER — IOHEXOL 350 MG/ML SOLN
75.0000 mL | Freq: Once | INTRAVENOUS | Status: AC | PRN
  Administered 2024-05-26: 75 mL via INTRAVENOUS

## 2024-05-26 MED ORDER — HYDROCODONE-ACETAMINOPHEN 5-325 MG PO TABS
1.0000 | ORAL_TABLET | Freq: Once | ORAL | Status: AC
  Administered 2024-05-26: 1 via ORAL
  Filled 2024-05-26: qty 1

## 2024-05-26 MED ORDER — FLUORESCEIN SODIUM 1 MG OP STRP
1.0000 | ORAL_STRIP | Freq: Once | OPHTHALMIC | Status: DC
  Filled 2024-05-26: qty 1

## 2024-05-26 MED ORDER — TETRACAINE HCL 0.5 % OP SOLN
2.0000 [drp] | Freq: Once | OPHTHALMIC | Status: DC
  Filled 2024-05-26: qty 4

## 2024-05-26 NOTE — ED Triage Notes (Addendum)
 BIB [facility] from home. Pt reports feeling dizzy and slipping to the floor while transferring from [facility] to bed. Denies headstrike. Denies LOC. On Eliquis . Pt reports corneal abrasion x2 years causing significant R eye swelling. No new vision changes. Pt denies new pain, endorses chronic back pain. No interventions en route.

## 2024-05-26 NOTE — Progress Notes (Signed)
 Orthopedic Tech Progress Note Patient Details:  Taylor Myers 06-18-1951 [id number]  Patient ID: Lamar [name], male   DOB: Jul 16, 1951, 73 y.o.   MRN: [id number] LV2T FOT. No orders at this time. Ahmia Colford L [name] 05/26/2024, 5:57 AM

## 2024-05-26 NOTE — ED Provider Notes (Signed)
 Pt here with mechanical fall at home Acute alcohol  intoxication Chronic pain all over, chronic swollen eye with old abrasions reported, no change in vision CT trauma imaging without significant traumatic findings  Lactate 3.5 initially, may be etoh intoxication - pending repeat level Pending UA as well w/ hx of UTI Will also need improved sobriety  Pt wheelchair bound reportedly at baseline  WBC wnl, chronic sacral wound does not appear overtly infected   Physical Exam  BP 136/70   Pulse 94   Temp 98 F (36.7 C) (Oral)   Resp 18   SpO2 95%   Physical Exam  Procedures  Procedures  ED Course / MDM    Medical Decision Making Amount and/or Complexity of Data Reviewed Labs: ordered. Radiology: ordered.  Risk Prescription drug management.   Patient was reevaluated this morning after nearly 5 hours observation in the ED.  He is now awake, appears clinically sober.  He has no acute complaints for me.  He says he is comfortable and wanting to go home.  I reviewed his workup in the ED, including labs, CT imaging, and urine.  There is no clear evidence of infection here.  He does have a mildly elevated lactate level, likely related to dehydration, as he does not drink a lot of water at home.  But I do not have any clinical concern for sepsis or bacteremia.  There is no evidence of aspiration pneumonia or other infection from his CT imaging.  Of note, the patient does have obstructive sleep apnea, and he was noted to have some borderline hypoxia while dozing, but when he is awake and talking to me I weaned him quickly off of oxygen and he is maintaining his saturation at 97%.  Compliant with eliquis  at home - low suspicion for acute PE. I think he is stable for discharge and will need PTAR transportation home as he primarily uses a wheelchair       Cottie Donnice PARAS, MD 05/26/24 1002

## 2024-05-26 NOTE — Discharge Instructions (Addendum)
 Please follow up with your primary care office next week for a recheck of your symptoms.  Please try to cut back on your drinking.  This can lead to dehydration, falls and weakness.  We drew blood cultures while you were in the ER today.  If your cultures do end up growing bacteria, or signs of infection on the blood, you will receive a phone call telling you to urgently come back to the hospital.  You should also return if you have worsening weakness, fevers, persistent vomiting, confusion, or any other emergency concerns.

## 2024-05-26 NOTE — ED Notes (Signed)
 Stage 2 pressure sore noted to R bottom. Mepilex applied.

## 2024-05-26 NOTE — Progress Notes (Addendum)
..  Trauma Response Nurse Documentation   Taylor Myers is a 73 y.o. male arriving to [facility] ED via EMS  On Eliquis  (apixaban ) daily. Trauma was activated as a Level 2 by charge nurse based on the following trauma criteria Elderly patients > 65 with head trauma on anti-coagulation (excluding ASA).  Patient cleared for CT by Dr. Carita. Pt transported to CT with trauma response nurse present to monitor. RN remained with the patient throughout their absence from the department for clinical observation.   GCS 15.  Trauma MD Arrival Time: N/A  History   Past Medical History:  Diagnosis Date   A-fib (HCC)    Alcohol  abuse, in remission    Arthritis    Asthma    child hood asthma   History of kidney stones    dormant kidney stone   Hypertension    Wheelchair dependent      Past Surgical History:  Procedure Laterality Date   ANKLE SURGERY Right    APPLICATION OF WOUND VAC Right 03/19/2022   Procedure: APPLICATION OF WOUND VAC;  Surgeon: Jerri Kay HERO, MD;  Location: [facility] OR;  Service: Orthopedics;  Laterality: Right;   HIP SURGERY Right    total replacement   KNEE SURGERY     bilateral replacements   ORIF HUMERUS FRACTURE Right 03/19/2022   Procedure: OPEN REDUCTION INTERNAL FIXATION (ORIF) RIGHT DISTAL HUMERUS FRACTURE;  Surgeon: Jerri Kay HERO, MD;  Location: [facility] OR;  Service: Orthopedics;  Laterality: Right;       Initial Focused Assessment (If applicable, or please see trauma documentation):   CT's Completed:   CT Head, CT Maxillofacial, CT C-Spine, CT Chest w/ contrast, and CT abdomen/pelvis w/ contrast   Interventions:   Plan for disposition:  Unkn at this time  Consults completed:  none   Event Summary: Pt arrived via [facility] from home after falling from toilet onto floor. Pt c/o low back and hip pain. GCS 15, denies vision disturbance.  Pt denies LOC, denies hitting head. Significant R side periorbital edema noted. Pt reports this is normal since corneal abrasion  2 years ago          (photo in chart). Pt reports pain to wound crevice between R thigh and scrotum. IV est, labs and BC drawn.  Pt reports feeling febrile recently, general malaise. Pt reports he was recently admitted for infection in his blood. Pt reports having a few martinis last night, urinary incontinence noted. hx of etoh abuse.   Tono pen, woods lamp, fluorescein and tetracaine  at the bedside.    MTP Summary (If applicable): N/A  Bedside handoff with ED RN Annabella.    Jeri Jeanbaptiste Dee  Trauma Response RN  Please call TRN at [phone] for further assistance.

## 2024-05-26 NOTE — ED Notes (Signed)
 Pt reports having a few martinis last night.

## 2024-05-26 NOTE — ED Notes (Signed)
 PTAR called, eta next

## 2024-05-26 NOTE — ED Provider Notes (Signed)
  EMERGENCY DEPARTMENT AT [facility] Provider Note   CSN: [id number] Arrival date & time: 05/26/24  0501     Patient presents with: Taylor Myers is a 73 y.o. male.   Patient arrives as a level 2 trauma with fall on Eliquis .  He does have a history of hypertension, atrial fibrillation, alcohol  abuse.  He states he is mostly wheelchair-bound.  Today he fell from his wheelchair while transferring from the commode.  Does not think like he hit his head but landed on his back.  EMS reported he was found in a prone position complaining of feeling dizzy and lightheaded.  He does not think he passed out.  He is not feeling dizzy currently.  He denies hitting his head.  But has pain all over which is chronic.  He was apparently hospitalized several months ago for sepsis from a UTI and has been feeling poorly since. He does have a wound to his right hip area which is slowly healing. Arrives with significant right orbital swelling which he states is not from the fall but is chronic for the past 2 years.  He states this is from a corneal abrasion and follows with an ophthalmologist.  Vision has not changed today.  The history is provided by the patient.  Fall Pertinent negatives include no abdominal pain and no headaches.       Prior to Admission medications   Medication Sig Start Date End Date Taking? Authorizing Provider  amLODipine  (NORVASC ) 5 MG tablet Take 1 tablet (5 mg total) by mouth daily. 09/02/23 01/28/24  Kate Lonni CROME, MD  apixaban  (ELIQUIS ) 5 MG TABS tablet Take 1 tablet (5 mg total) by mouth 2 (two) times daily. 09/02/23   Kate Lonni CROME, MD  atorvastatin  (LIPITOR) 40 MG tablet Take 1 tablet (40 mg total) by mouth daily. 09/02/23 01/28/24  Kate Lonni CROME, MD  Magnesium  Oxide 400 MG CAPS Take 1 capsule (400 mg total) by mouth daily. Patient taking differently: Take 400 mg by mouth at bedtime. 09/14/23   Kate Lonni CROME, MD   MILK THISTLE PO Take 1 tablet by mouth at bedtime.    [provider]  Misc Natural Products (BEET ROOT PO) Take 1 tablet by mouth at bedtime.    [provider]  nystatin  cream (MYCOSTATIN ) Apply to affected area 2 times daily 12/12/23   Mannie Pac T, DO  OVER THE COUNTER MEDICATION Take 1 capsule by mouth at bedtime. OTC supplement - liver and kidney cleanser    [provider]  oxyCODONE  (OXY IR/ROXICODONE ) 5 MG immediate release tablet Take 1 tablet (5 mg total) by mouth every 6 (six) hours as needed for up to 10 doses for severe pain (pain score 7-10). 12/17/23   Sira, Zackery, MD  TURMERIC CURCUMIN PO Take 1 tablet by mouth at bedtime.    [provider]    Allergies: Patient has no known allergies.    Review of Systems  Constitutional:  Negative for activity change, appetite change and fever.  HENT:  Negative for congestion and rhinorrhea.   Eyes:  Negative for visual disturbance.  Gastrointestinal:  Negative for abdominal pain, nausea and vomiting.  Genitourinary:  Negative for dysuria and hematuria.  Musculoskeletal:  Positive for arthralgias, back pain and myalgias.  Skin:  Negative for rash.  Neurological:  Positive for dizziness and light-headedness. Negative for weakness and headaches.   all other systems are negative except as noted in the HPI  and PMH.    Updated Vital Signs BP 136/70   Pulse 94   Temp 98 F (36.7 C) (Oral)   Resp 18   SpO2 95%   Physical Exam Vitals and nursing note reviewed.  Constitutional:      General: He is not in acute distress.    Appearance: He is well-developed. He is ill-appearing.     Comments: Chronically ill-appearing  HENT:     Head: Normocephalic and atraumatic.     Mouth/Throat:     Pharynx: No oropharyngeal exudate.  Eyes:     Conjunctiva/sclera: Conjunctivae normal.     Pupils: Pupils are equal, round, and reactive to light.     Comments: Significant right periorbital swelling which  he says is chronic. No warmth or erythema extraocular movements are intact. No areas of fluorescein uptake.  Intraocular pressure is 11.  Neck:     Comments: Paraspinal C-spine pain without midline pain. Cardiovascular:     Rate and Rhythm: Normal rate and regular rhythm.     Heart sounds: Normal heart sounds. No murmur heard. Pulmonary:     Effort: Pulmonary effort is normal. No respiratory distress.     Breath sounds: Normal breath sounds.  Abdominal:     Palpations: Abdomen is soft.     Tenderness: There is no abdominal tenderness. There is no guarding or rebound.  Musculoskeletal:        General: No tenderness. Normal range of motion.     Cervical back: Normal range of motion and neck supple.     Comments: Shallow ulceration to R buttock.  Pelvis stable  Skin:    General: Skin is warm.  Neurological:     Mental Status: He is alert and oriented to person, place, and time.     Cranial Nerves: No cranial nerve deficit.     Motor: No abnormal muscle tone.     Coordination: Coordination normal.     Comments:  5/5 strength throughout. CN 2-12 intact.Equal grip strength.   Psychiatric:        Behavior: Behavior normal.        (all labs ordered are listed, but only abnormal results are displayed) Labs Reviewed  COMPREHENSIVE METABOLIC PANEL WITH GFR - Abnormal; Notable for the following components:      Result Value   Calcium  8.4 (*)    AST 51 (*)    All other components within normal limits  CBC - Abnormal; Notable for the following components:   RBC 3.88 (*)    Hemoglobin 12.5 (*)    HCT 38.0 (*)    All other components within normal limits  ETHANOL - Abnormal; Notable for the following components:   Alcohol , Ethyl (B) 293 (*)    All other components within normal limits  I-STAT CHEM 8, ED - Abnormal; Notable for the following components:   Calcium , Ion 0.99 (*)    All other components within normal limits  I-STAT CG4 LACTIC ACID, ED - Abnormal; Notable for the  following components:   Lactic Acid, Venous 3.5 (*)    All other components within normal limits  I-STAT CG4 LACTIC ACID, ED - Abnormal; Notable for the following components:   Lactic Acid, Venous 3.8 (*)    All other components within normal limits  CULTURE, BLOOD (ROUTINE X 2)  CULTURE, BLOOD (ROUTINE X 2)  URINALYSIS, ROUTINE W REFLEX MICROSCOPIC  PROTIME-INR    EKG: EKG Interpretation Date/Time:  Saturday May 26 2024 08:51:37 EDT Ventricular Rate:  80  PR Interval:    QRS Duration:  144 QT Interval:  536 QTC Calculation: 619 R Axis:   239  Text Interpretation: Atrial fibrillation Right bundle branch block No significant change was found Confirmed by Carita Senior [phone]) on 05/26/2024 8:57:35 AM  Radiology: CT CHEST ABDOMEN PELVIS W CONTRAST Result Date: 05/26/2024 EXAM: CT CHEST, ABDOMEN AND PELVIS WITH CONTRAST 05/26/2024 06:34:49 AM TECHNIQUE: CT of the chest, abdomen and pelvis was performed with the administration of 75 mL of iohexol  (OMNIPAQUE ) 350 MG/ML injection. Multiplanar reformatted images are provided for review. Automated exposure control, iterative reconstruction, and/or weight based adjustment of the mA/kV was utilized to reduce the radiation dose to as low as reasonably achievable. COMPARISON: CT of the abdomen and pelvis dated 12/12/2023. CLINICAL HISTORY: Polytrauma, blunt. Patient reports feeling dizzy and slipping to the floor while transferring from wheelchair to bed. Denies headstrike. Denies loss of consciousness. On Eliquis . Patient reports corneal abrasion 2 years ago causing significant right eye swelling. No new vision changes. Patient denies new pain; endorses chronic back pain. FINDINGS: MEDIASTINUM AND LYMPH NODES: The heart is moderately enlarged and there is moderate calcific coronary artery disease. There is mild calcific atheromatous disease within the aortic arch. The central airways are clear. No mediastinal, hilar or axillary lymphadenopathy.  LUNGS AND PLEURA: There is mild dependent atelectasis within the right lower lobe. There are few irregular reticular opacities within the periphery of the lung bases. There is mild bronchiectasis present within the right lower lobe. No focal consolidation or pulmonary edema. No pleural effusion or pneumothorax. LIVER: The liver is unremarkable. GALLBLADDER AND BILE DUCTS: There are mild edematous changes within the wall of the gallbladder. No biliary ductal dilatation. SPLEEN: No acute abnormality. PANCREAS: No acute abnormality. ADRENAL GLANDS: No acute abnormality. KIDNEYS, URETERS AND BLADDER: There is a 6 mm nonobstructive calculus within the right kidney. There is an 8.5 cm exophytic cyst arising laterally from the right kidney. There is also a 3 cm cyst arising from the inferior pole of the right kidney. There is a 3 mm nonobstructive calculus within the left kidney. There is also a small simple cyst. No stones in the ureters. No hydronephrosis. No perinephric or periureteral stranding. There is no evidence of obstructive uropathy. Urinary bladder is unremarkable. Per consensus, no follow-up is needed for simple Bosniak type 1 and 2 renal cysts, unless the patient has a malignancy history or risk factors. GI AND BOWEL: Stomach demonstrates no acute abnormality. There is no bowel obstruction. REPRODUCTIVE ORGANS: No acute abnormality. PERITONEUM AND RETROPERITONEUM: No ascites. No free air. VASCULATURE: Aorta is normal in caliber. There is moderate calcific atheromatous disease within the abdominal aorta. ABDOMINAL AND PELVIS LYMPH NODES: No lymphadenopathy. BONES AND SOFT TISSUES: The patient is status post right total hip arthroplasty. There are chronic degenerative changes present throughout the thoracolumbar spine. There is a mild chronic compression deformity of T11. There is grade 1 degenerative anterolisthesis at L4-L5. There is chronic downward bowing of the superior endplate of L5. No focal soft tissue  abnormality. IMPRESSION: 1. No evidence of acute traumatic injury. 2. Mild gallbladder wall edema. 3. Moderately enlarged heart with moderate calcific coronary artery disease and mild calcific atheromatous disease within the aortic arch. 4. Nonobstructive renal calculi (6 mm right, 3 mm left) without obstructive uropathy. 5. Chronic degenerative changes of the thoracolumbar spine, including mild chronic T11 compression deformity, grade 1 degenerative anterolisthesis at L4-5, and chronic downward bowing of the L5 superior endplate. Electronically signed by: Evalene Coho MD  05/26/2024 07:13 AM EDT RP Workstation: [id number]   CT CERVICAL SPINE WO CONTRAST Result Date: 05/26/2024 EXAM: CT CERVICAL SPINE WITHOUT CONTRAST 05/26/2024 06:34:49 AM TECHNIQUE: CT of the cervical spine was performed without the administration of intravenous contrast. Multiplanar reformatted images are provided for review. Automated exposure control, iterative reconstruction, and/or weight based adjustment of the mA/kV was utilized to reduce the radiation dose to as low as reasonably achievable. COMPARISON: CT of the cervical spine dated 04/29/2024. CLINICAL HISTORY: Polytrauma, blunt. Pt reports feeling dizzy and slipping to the floor while transferring from [facility] to bed. Denies headstrike. Denies LOC. On Eliquis . Pt reports corneal abrasion x2 years causing significant R eye swelling. No new vision changes. Pt denies new pain, endorses chronic back pain. FINDINGS: CERVICAL SPINE: BONES AND ALIGNMENT: No acute fracture or traumatic malalignment. There is degenerative range of listhesis at C4-C5 and C7-T1 as before. DEGENERATIVE CHANGES: There are anterior bridging osteophytes again seen extending from C3 through C6. At C3-C4, there is a central posterior osteophyte causing moderate central spinal canal stenosis and apartment of the ventral surface of the spinal cord. There is moderate diffuse facet arthrosis. SOFT TISSUES: No prevertebral  soft tissue swelling. There is extensive calcific atheromatous disease within the carotid bulbs. IMPRESSION: 1. No acute abnormality of the cervical spine. 2. At C3-4, moderate central spinal canal stenosis with effacement of the ventral cord from a central posterior osteophyte. 3. Stable degenerative changes including anterior bridging osteophytes from C3C6, degenerative listhesis at C4-5 and C7-T1, and moderate diffuse facet arthrosis. Electronically signed by: Evalene Coho MD 05/26/2024 07:05 AM EDT RP Workstation: [id number]   CT MAXILLOFACIAL WO CONTRAST Result Date: 05/26/2024 EXAM: CT OF THE FACE WITHOUT CONTRAST 05/26/2024 06:34:49 AM TECHNIQUE: CT of the face was performed without the administration of intravenous contrast. Multiplanar reformatted images are provided for review. Automated exposure control, iterative reconstruction, and/or weight based adjustment of the mA/kV was utilized to reduce the radiation dose to as low as reasonably achievable. COMPARISON: Maxillofacial CT dated 03/28/2021. CLINICAL HISTORY: Facial trauma, blunt. Patient reports feeling dizzy and slipping to the floor while transferring from wheelchair to bed. Denies headstrike. Denies loss of consciousness. On Eliquis . Patient reports corneal abrasion for 2 years causing significant right eye swelling. No new vision changes. Patient denies new pain; endorses chronic back pain. FINDINGS: FACIAL BONES: Interval healing of the patient's left orbital floor fracture. No acute facial fracture. No mandibular dislocation. No suspicious bone lesion. ORBITS: Globes are intact. Right protosis. Right periorbital soft tissue swelling. No acute traumatic injury. No inflammatory change. SINUSES AND MASTOIDS: Mild mucosal disease within the ethmoid air cells and floors of the maxillary sinuses. SOFT TISSUES: Right periorbital soft tissue swelling. IMPRESSION: 1. No acute facial fracture. 2. Right proptosis with right periorbital soft tissue  swelling. 3. Interval healing of the left orbital floor fracture. 4. Mild mucosal disease within the ethmoid air cells and floors of the maxillary sinuses. Electronically signed by: Evalene Coho MD 05/26/2024 07:00 AM EDT RP Workstation: [id number]   CT HEAD WO CONTRAST Result Date: 05/26/2024 EXAM: CT HEAD WITHOUT CONTRAST 05/26/2024 06:34:49 AM TECHNIQUE: CT of the head was performed without the administration of intravenous contrast. Automated exposure control, iterative reconstruction, and/or weight based adjustment of the mA/kV was utilized to reduce the radiation dose to as low as reasonably achievable. COMPARISON: CT of the head dated 04/29/2024. CLINICAL HISTORY: Head trauma, moderate-severe. Patient reports feeling dizzy and slipping to the floor while transferring from [facility] to bed. Denies headstrike. Denies  LOC. On Eliquis . Patient reports corneal abrasion x2 years causing significant R eye swelling. No new vision changes. Patient denies new pain, endorses chronic back pain. FINDINGS: BRAIN AND VENTRICLES: Age-related cerebral atrophy and mild periventricular white matter disease. Chronic encephalomalacia changes are again demonstrated within the left cerebellar hemisphere. No acute hemorrhage. No evidence of acute infarct. No hydrocephalus. No extra-axial collection. No mass effect or midline shift. There is no evidence of acute intracranial injury. ORBITS: The patient is status post bilateral lens replacement. There is right-sided proptosis, similar to the prior exam. SINUSES: There is mucosal disease within the ethmoid air cells. SOFT TISSUES AND SKULL: There is right supraorbital soft tissue swelling. No skull fracture. IMPRESSION: 1. No acute intracranial abnormality. 2. Right supraorbital soft tissue swelling and chronic right-sided proptosis. 3. Mucosal disease within the ethmoid air cells. Electronically signed by: Evalene Coho MD 05/26/2024 06:54 AM EDT RP Workstation: [id number]   DG  Pelvis Portable Result Date: 05/26/2024 EXAM: 1 or 2 VIEW(S) XRAY OF THE PELVIS 05/26/2024 05:17:00 AM COMPARISON: 01/28/2024 CLINICAL HISTORY: Trauma. fall FINDINGS: BONES AND JOINTS: No acute fracture. No focal osseous lesion. No joint dislocation. Right hip arthroplasty noted. Degenerative changes of the left hip. SOFT TISSUES: The soft tissues are unremarkable. Vascular calcifications. IMPRESSION: 1. No acute osseous abnormality. 2. Right hip arthroplasty without acute hardware complication identified. 3. Degenerative changes of the left hip. Electronically signed by: Evalene Coho MD 05/26/2024 05:30 AM EDT RP Workstation: [id number]   DG Chest Port 1 View Result Date: 05/26/2024 EXAM: 1 VIEW(S) XRAY OF THE CHEST 05/26/2024 05:17:00 AM COMPARISON: 08/15/2023 CLINICAL HISTORY: Trauma. fall FINDINGS: LUNGS AND PLEURA: No focal pulmonary opacity. No pulmonary edema. Trace right pleural effusion. No pneumothorax. HEART AND MEDIASTINUM: Similar cardiomegaly. BONES AND SOFT TISSUES: Remote proximal left humerus fracture. Polyarticular degenerative changes. IMPRESSION: 1. No acute cardiopulmonary process. 2. Trace right pleural effusion. Electronically signed by: Evalene Coho MD 05/26/2024 05:29 AM EDT RP Workstation: [id number]     Procedures   Medications Ordered in the ED  fluorescein ophthalmic strip 1 strip (has no administration in time range)  tetracaine  (PONTOCAINE) 0.5 % ophthalmic solution 2 drop (has no administration in time range)                                    Medical Decision Making Amount and/or Complexity of Data Reviewed Labs: ordered. Decision-making details documented in ED Course. Radiology: ordered and independent interpretation performed. Decision-making details documented in ED Course. ECG/medicine tests: ordered and independent interpretation performed. Decision-making details documented in ED Course.  Risk Prescription drug management.   Fall on blood  thinners but denies specific injury.  Does not think he hit his head.  Complains of pain all over which is chronic.  GCS is 15, ABCs are intact and vital signs are stable. Does have significant swelling of right orbit which he says is chronic  Has pain all over but none of it is reported new from fall. Does take eliquis . Will obtain trauma imaging.   Vitals stable. Hx UTI and bacteremia in past. Does have R buttock ulceration but this does not appear overtly infected.  Labs with lactic acidosis thought to be due from alcohol  intoxication rather than sepsis. No fever or leukocytosis.   CT head and C Spine negative for acute traumatic injury. Patient already aware of spinal canal stenosis  CT abdomen and pelvis without traumatic injury as well. Does  show gallbladder edema but he denies abdominal pain and has no RUQ tenderness.  Chronic changes to T and L spine.   Will allow to sober. IVF given. Repeat lactate pending as well as urinalysis. Doubt sepsis and suspect lactate elevation secondary to intoxication. Dr. Cottie to assume care.      Final diagnoses:  None    ED Discharge Orders     None          [employer]is[employer] Claudio, Garnette, MD 05/26/24 [phone]

## 2024-05-30 LAB — CULTURE, BLOOD (ROUTINE X 2): Special Requests: ADEQUATE

## 2024-05-31 ENCOUNTER — Telehealth (HOSPITAL_BASED_OUTPATIENT_CLINIC_OR_DEPARTMENT_OTHER): Payer: Self-pay | Admitting: Emergency Medicine

## 2024-05-31 LAB — CULTURE, BLOOD (ROUTINE X 2)
Culture: NO GROWTH
Special Requests: ADEQUATE

## 2024-05-31 NOTE — Telephone Encounter (Signed)
 Post ED Visit - Positive Culture Follow-up: Successful Patient Follow-Up  Culture assessed and recommendations reviewed by:  []  Rankin Dee, Pharm.D. []  Venetia Gully, Pharm.D., BCPS AQ-ID []  Garrel Crews, Pharm.D., BCPS []  Almarie Lunger, Pharm.D., BCPS []  [place], [place].D., BCPS, AAHIVP []  Rosaline Bihari, Pharm.D., BCPS, AAHIVP []  Vernell Meier, PharmD, BCPS []  Latanya Hint, PharmD, BCPS []  Donald Medley, PharmD, BCPS [x]  Joesph Rocher, PharmD  Positive blood culture  []  Patient discharged without antimicrobial prescription and treatment is now indicated []  Organism is resistant to prescribed ED discharge antimicrobial [x]  Patient with positive blood cultures  Changes discussed with ED provider: Lavonia Pat, MD  Plan:  call patient and notify of positive blood culture, IF s/sx of infection present, instruct patient to return to ED for treatment.  Contacted patient, date 05/31/2024, time 1400 Pt denies s/sx of infection, reports feeling better after ED visit, but that his urine is darker than time of ED visit. No other urinary complaints. Pt reports they have a appt for follow-up with PCP in 4 days. Instructed to return to ED for any new or worsening symptoms. Pt verbalized understanding.   Taylor Myers Taylor Myers 05/31/2024, 2:32 PM

## 2024-05-31 NOTE — Progress Notes (Signed)
 ED Antimicrobial Stewardship Positive Culture Follow Up   Taylor Myers is an 73 y.o. male who presented to [facility] on 05/26/2024 with a chief complaint of  Chief Complaint  Patient presents with   Fall    Recent Results (from the past 720 hours)  Blood culture (routine x 2)     Status: None   Collection Time: 05/26/24  5:23 AM   Specimen: BLOOD  Result Value Ref Range Status   Specimen Description BLOOD SITE NOT SPECIFIED  Final   Special Requests   Final    BOTTLES DRAWN AEROBIC AND ANAEROBIC Blood Culture adequate volume   Culture   Final    NO GROWTH 5 DAYS Performed at [facility], [address] [place], [place] [address]    Report Status 05/31/2024 FINAL  Final  Blood culture (routine x 2)     Status: Abnormal   Collection Time: 05/26/24  5:23 AM   Specimen: BLOOD  Result Value Ref Range Status   Specimen Description BLOOD SITE NOT SPECIFIED  Final   Special Requests   Final    BOTTLES DRAWN AEROBIC AND ANAEROBIC Blood Culture adequate volume   Culture  Setup Time   Final    GRAM NEGATIVE RODS GRAM POSITIVE COCCI IN CLUSTERS ANAEROBIC BOTTLE ONLY CRITICAL RESULT CALLED TO, READ BACK BY AND VERIFIED WITH: J GROSE CHARGE NURSE 05/26/2024 @ 2309 BY AB    Culture (A)  Final    ACINETOBACTER LWOFFII STAPHYLOCOCCUS EPIDERMIDIS THE SIGNIFICANCE OF ISOLATING THIS ORGANISM FROM A SINGLE SET OF BLOOD CULTURES WHEN MULTIPLE SETS ARE DRAWN IS UNCERTAIN. PLEASE NOTIFY THE MICROBIOLOGY DEPARTMENT WITHIN ONE WEEK IF SPECIATION AND SENSITIVITIES ARE REQUIRED. Performed at [facility], [address] [place], [place] [address]    Report Status 05/30/2024 FINAL  Final   Organism ID, Bacteria ACINETOBACTER LWOFFII  Final      Susceptibility   Acinetobacter lwoffii - MIC*    AMPICILLIN/SULBACTAM <=2 SENSITIVE Sensitive     MEROPENEM <=0.25 SENSITIVE Sensitive     * ACINETOBACTER LWOFFII  Blood Culture ID Panel (Reflexed)     Status: Abnormal   Collection Time:  05/26/24  5:23 AM  Result Value Ref Range Status   Enterococcus faecalis NOT DETECTED NOT DETECTED Final   Enterococcus Faecium NOT DETECTED NOT DETECTED Final   Listeria monocytogenes NOT DETECTED NOT DETECTED Final   Staphylococcus species DETECTED (A) NOT DETECTED Final    Comment: CRITICAL RESULT CALLED TO, READ BACK BY AND VERIFIED WITH: J GROSE CHARGE NURSE 05/26/2024 @ 2309 BY AB    Staphylococcus aureus (BCID) NOT DETECTED NOT DETECTED Final   Staphylococcus epidermidis DETECTED (A) NOT DETECTED Final    Comment: Methicillin (oxacillin) resistant coagulase negative staphylococcus. Possible blood culture contaminant (unless isolated from more than one blood culture draw or clinical case suggests pathogenicity). No antibiotic treatment is indicated for blood  culture contaminants. CRITICAL RESULT CALLED TO, READ BACK BY AND VERIFIED WITH: J GROSE CHARGE NURSE 05/26/2024 @ 2309 BY AB    Staphylococcus lugdunensis NOT DETECTED NOT DETECTED Final   Streptococcus species NOT DETECTED NOT DETECTED Final   Streptococcus agalactiae NOT DETECTED NOT DETECTED Final   Streptococcus pneumoniae NOT DETECTED NOT DETECTED Final   Streptococcus pyogenes NOT DETECTED NOT DETECTED Final   A.calcoaceticus-baumannii NOT DETECTED NOT DETECTED Final   Bacteroides fragilis NOT DETECTED NOT DETECTED Final   Enterobacterales NOT DETECTED NOT DETECTED Final   Enterobacter cloacae complex NOT DETECTED NOT DETECTED  Final   Escherichia coli NOT DETECTED NOT DETECTED Final   Klebsiella aerogenes NOT DETECTED NOT DETECTED Final   Klebsiella oxytoca NOT DETECTED NOT DETECTED Final   Klebsiella pneumoniae NOT DETECTED NOT DETECTED Final   Proteus species NOT DETECTED NOT DETECTED Final   Salmonella species NOT DETECTED NOT DETECTED Final   Serratia marcescens NOT DETECTED NOT DETECTED Final   Haemophilus influenzae NOT DETECTED NOT DETECTED Final   Neisseria meningitidis NOT DETECTED NOT DETECTED Final    Pseudomonas aeruginosa NOT DETECTED NOT DETECTED Final   Stenotrophomonas maltophilia NOT DETECTED NOT DETECTED Final   Candida albicans NOT DETECTED NOT DETECTED Final   Candida auris NOT DETECTED NOT DETECTED Final   Candida glabrata NOT DETECTED NOT DETECTED Final   Candida krusei NOT DETECTED NOT DETECTED Final   Candida parapsilosis NOT DETECTED NOT DETECTED Final   Candida tropicalis NOT DETECTED NOT DETECTED Final   Cryptococcus neoformans/gattii NOT DETECTED NOT DETECTED Final   Methicillin resistance mecA/C DETECTED (A) NOT DETECTED Final    Comment: CRITICAL RESULT CALLED TO, READ BACK BY AND VERIFIED WITH: J GROSE CHARGE NURSE 05/26/2024 @ 2309 BY AB Performed at [facility], [address] [place], [place] [address]    [x]  Patient discharged originally without antimicrobial agent.  Plan: Contact patient. Alert patient of positive blood culture and identify if signs/symptoms of infection are present. If symptoms present, instruct patient to return to the ED for treatment. If patient is without signs/symptoms of infection, okay to continue without antibiotics.  ED Provider: Lavonia Pat, MD  B. Maegan Rogerio Boutelle, PharmD PGY-1 Pharmacy Resident [facility] 05/31/2024 9:28 AM   Monday - Friday phone -  [phone] Saturday - Sunday phone - [phone]

## 2024-06-11 ENCOUNTER — Other Ambulatory Visit: Payer: Self-pay | Admitting: Cardiology

## 2024-06-11 DIAGNOSIS — I4821 Permanent atrial fibrillation: Secondary | ICD-10-CM

## 2024-06-11 DIAGNOSIS — I10 Essential (primary) hypertension: Secondary | ICD-10-CM

## 2024-06-11 NOTE — Telephone Encounter (Signed)
 Prescription refill request for Eliquis  received. Indication:afib Last office visit:1/25 Scr:1.10  10/25 Age: 73 Weight:74  kg  Prescription refilled

## 2024-06-25 ENCOUNTER — Encounter (HOSPITAL_BASED_OUTPATIENT_CLINIC_OR_DEPARTMENT_OTHER): Attending: Internal Medicine | Admitting: General Surgery

## 2024-06-25 DIAGNOSIS — Z6825 Body mass index (BMI) 25.0-25.9, adult: Secondary | ICD-10-CM | POA: Insufficient documentation

## 2024-06-25 DIAGNOSIS — Z993 Dependence on wheelchair: Secondary | ICD-10-CM | POA: Insufficient documentation

## 2024-06-25 DIAGNOSIS — E119 Type 2 diabetes mellitus without complications: Secondary | ICD-10-CM | POA: Insufficient documentation

## 2024-06-25 DIAGNOSIS — E440 Moderate protein-calorie malnutrition: Secondary | ICD-10-CM | POA: Insufficient documentation

## 2024-06-25 DIAGNOSIS — L89313 Pressure ulcer of right buttock, stage 3: Secondary | ICD-10-CM | POA: Insufficient documentation

## 2024-06-28 ENCOUNTER — Ambulatory Visit: Attending: Student

## 2024-06-28 DIAGNOSIS — M6281 Muscle weakness (generalized): Secondary | ICD-10-CM | POA: Insufficient documentation

## 2024-06-28 DIAGNOSIS — R293 Abnormal posture: Secondary | ICD-10-CM | POA: Insufficient documentation

## 2024-06-28 DIAGNOSIS — R262 Difficulty in walking, not elsewhere classified: Secondary | ICD-10-CM | POA: Insufficient documentation

## 2024-06-28 NOTE — Therapy (Signed)
 OUTPATIENT PHYSICAL THERAPY WHEELCHAIR EVALUATION   Patient Name: Taylor Myers MRN: [id number] DOB:01/03/1951, 73 y.o., male Today's Date: 06/28/2024  END OF SESSION:  PT End of Session - 06/28/24 0945     Visit Number 1    Number of Visits 1    Authorization Type Medicare + UHC    PT Start Time [phone]    PT Stop Time 1045    PT Time Calculation (min) 47 min    Activity Tolerance Patient tolerated treatment well    Behavior During Therapy WFL for tasks assessed/performed          Past Medical History:  Diagnosis Date   A-fib (HCC)    Alcohol  abuse, in remission    Arthritis    Asthma    child hood asthma   History of kidney stones    dormant kidney stone   Hypertension    Wheelchair dependent    Past Surgical History:  Procedure Laterality Date   ANKLE SURGERY Right    APPLICATION OF WOUND VAC Right 03/19/2022   Procedure: APPLICATION OF WOUND VAC;  Surgeon: Jerri Kay HERO, MD;  Location: [facility] OR;  Service: Orthopedics;  Laterality: Right;   HIP SURGERY Right    total replacement   KNEE SURGERY     bilateral replacements   ORIF HUMERUS FRACTURE Right 03/19/2022   Procedure: OPEN REDUCTION INTERNAL FIXATION (ORIF) RIGHT DISTAL HUMERUS FRACTURE;  Surgeon: Jerri Kay HERO, MD;  Location: [facility] OR;  Service: Orthopedics;  Laterality: Right;   Patient Active Problem List   Diagnosis Date Noted   Bacteremia due to Staphylococcus 12/14/2023   Atrial fibrillation with RVR (HCC) 06/27/2023   Alcohol  use 07/13/2022   Bacterial conjunctivitis of right eye 07/13/2022   Candidal dermatitis 07/13/2022   Falls, initial encounter 07/13/2022   Bradycardia 07/12/2022   Hypokalemia 07/12/2022   Hypomagnesemia 07/12/2022   Hypocalcemia 07/12/2022   S/P ORIF (open reduction internal fixation) fracture 06/08/2022   History of open reduction and internal fixation (ORIF) procedure 03/19/2022   Right supracondylar humerus fracture, closed, initial encounter 03/12/2022   Malnutrition of  moderate degree 05/21/2021   Malnutrition 05/19/2021   Gait difficulty 05/19/2021   Muscle weakness 05/19/2021   Noncompliance with medication regimen 05/19/2021   Stenosis of left carotid artery 05/19/2021   AKI (acute kidney injury) 05/19/2021   Elevated LFTs 05/19/2021   Wheelchair dependence 05/19/2021   Closed fracture of left orbital floor (HCC) 05/19/2021   S/P craniotomy 11/28/2020   Hypoalbuminemia 09/19/2020   Alcohol  abuse, in remission 02/06/2020   Benign prostatic hyperplasia with lower urinary tract symptoms 02/06/2020   Chronic pain 02/06/2020   Essential hypertension 02/06/2020   Long term (current) use of anticoagulants 02/06/2020   Gout 02/06/2020   Asthma without status asthmaticus 02/06/2020   Atrial fibrillation (HCC) 02/06/2020   Type 2 diabetes mellitus without complications (HCC) 02/06/2020   Trigeminal neuralgia of right side of face 02/06/2020    PCP: Damian Christians, NP  REFERRING PROVIDER: Damian Christians, NP  THERAPY DIAG:  Abnormal posture - Plan: PT plan of care cert/re-cert  Difficulty in walking, not elsewhere classified - Plan: PT plan of care cert/re-cert  Muscle weakness (generalized) - Plan: PT plan of care cert/re-cert  Rationale for Evaluation and Treatment Rehabilitation  SUBJECTIVE:  SUBJECTIVE STATEMENT: Pt presents for wheelchair evaluation. He is currently in a standard manual wheelchair and has been for quite some time. He reports bilateral cataracts. L eye has improved since surgery, but R eye sustained a corneal abrasion and he reports no vision in that R eye. Reports difficulty with depth perception and peripheral vision loss given impaired binocular vision. Does have an Aid come 3x a week to help with heavier ADLs (laundry, taking the trash out,  etc).   PRECAUTIONS: Fall   WEIGHT BEARING RESTRICTIONS No   PLOF:  Independent  PATIENT GOALS: to get a power wheelchair    MEDICAL HISTORY:  Primary diagnosis onset: ~24 years ago     Medical Diagnosis with ICD-10 code: M19.90 (ICD-10-CM) - Unspecified osteoarthritis, unspecified site  Z74.09 (ICD-10-CM) - Other reduced mobility     [] Progressive disease  Relevant future surgeries:     Height: 5'9 Weight: 163lb Explain recent changes or trends in weight:      History:  Past Medical History:  Diagnosis Date   A-fib (HCC)    Alcohol  abuse, in remission    Arthritis    Asthma    child hood asthma   History of kidney stones    dormant kidney stone   Hypertension    Wheelchair dependent        Cardio Status:  Functional Limitations: a-fib  [] Intact  [x]  Impaired      Respiratory Status:  Functional Limitations:   [x] Intact  [] Impaired   [] SOB [] COPD [] O2 Dependent ______LPM  [] Ventilator Dependent  Resp equip:                                                     Objective Measure(s):   Orthotics:   [] Amputee:                                                             [] Prosthesis:       HOME ENVIRONMENT:  [x] House [] Condo/town home [] Apartment [] Asst living [] LTCF         [x] Own  [] Rent   [x] Lives alone [] Lives with others -                             Hours without assistance: 24 hours  [x] Home is accessible to patient                                 Storage of wheelchair:  [x] In home   [] Other Comments:       COMMUNITY :  TRANSPORTATION:  [x] Car [] Programmer, Applications [] Adapted w/c Lift []  Ambulance [] Other:                     [] Sits in wheelchair during transport   Where is w/c stored during transport?  [x] [employer]  []  [employer]  r   [] Self-Driver       Drive while in  Biomedical Scientist [] yes [x] no   Employment and/or school:  Specific requirements pertaining to mobility  Other:  COMMUNICATION:  Verbal Communication  [x] WFL [] receptive  [x] WFL [] expressive [] Understandable  [] Difficult to understand  [] non-communicative  Primary Language:________english______ 2nd:_____________  Communication provided by:[x] Patient [] Family [] Caregiver [] Translator   [] Uses an augmentative communication device     Manufacturer/Model :    MOBILITY/BALANCE:  Sitting Balance  Standing Balance  Transfers  Ambulation   [] WFL      [] WFL  [x] Independent  []  Independent   [x] Uses UE for balance in sitting Comments:  [] Uses UE/device for stability Comments:  [x]  Min assist  []  Ambulates independently with       device:___________________      []  Mod assist  []  Able to ambulate ______ feet        safely/functionally/independently   []  Min assist  []  Min assist  []  Max assist  []  Non-functional ambulator         History/High risk of falls   []  Mod assist  []  Mod assist  []  Dependent  [x]  Unable to ambulate   []  Max  assist  []  Max assist  Transfer method:[x] 1 person [] 2 person [] sliding board [x] squat pivot [] stand pivot [] mechanical patient lift  [] other:   []  Unable  [x]  Unable    Fall History: # of falls in the past 6 months? 2, slipped out of bed and tipped backwards on wc ramp in wheelchair # of "near" falls in the past 6 months? 0    CURRENT SEATING / MOBILITY:  Current Mobility Device: [] None [] Cane/Walker [x] Manual [] Dependent [] Dependent w/ Tilt rScooter  [] Power (type of control):   Manufacturer:  Model: K3 Serial #:   Size:  Color:  Age:   Purchased by whom:   Current condition of mobility base:    Current seating system:                                                                       Age of seating system:    Describe posture in present seating system:    Is the current mobility meeting medical necessity?:  [] Yes [x] No Describe: he us  unable to efficiently propel a manual wheelchair due to strength and range of motion limitations   Ability to complete Mobility-Related Activities of Daily Living (MRADL's) with Current Mobility  Device:   Move room to room  [x] Independent  [] Min [] Mod [] Max assist  [] Unable  Comments:   Meal prep  [x] Independent  [] Min [] Mod [] Max assist  [] Unable    Feeding  [x] Independent  [] Min [] Mod [] Max assist  [] Unable    Bathing  [] Independent  [x] Min [] Mod [] Max assist  [] Unable    Grooming  [] Independent  [x] Min [] Mod [] Max assist  [] Unable    UE dressing  [] Independent  [x] Min [] Mod [] Max assist  [] Unable    LE dressing  [] Independent   [x] Min [] Mod [] Max assist  [] Unable    Toileting  [] Independent  [x] Min [] Mod [] Max assist  [] Unable    Bowel Mgt: [x]  Continent []  Incontinent []  Accidents []  Diapers []  Colostomy []  Bowel Program:  Bladder Mgt: [x]  Continent []  Incontinent []  Accidents []  Diapers []  Urinal []  Intermittent Cath []  Indwelling Cath []  Supra-pubic Cath     Current Mobility Equipment Trialed/ Ruled Out:    Does not meet mobility needs due to:  Mark all boxes that indicate inability to use the specific equipment listed     Meets needs for safe  independent functional  ambulation  / mobility    Risk of  Falling or History of Falls    Enviromental limitations      Cognition    Safety concerns with  physical ability    Decreased / limitations endurance  & strength     Decreased / limitations  motor skills  & coordination    Pain    Pace /  Speed    Cardiac and/or  respiratory condition    Contra - indicated by diagnosis   Cane/Crutches  []   []   []   []   []   []   []   []   []   []   []    Walker / Rollator  []  NA   []   [x]   []   []   [x]   [x]   [x]   [x]   [x]   [x]   []     Manual Wheelchair [id number]:  []  NA  []   []   []   []   [x]   [x]   [x]   [x]   [x]   [x]   []    Manual W/C (K0005) with power assist  []  NA  []   []   []   []   []   []   []   []   []   []   []    Scooter  []  NA  []   []   []   []   []   []   []   []   []   []   []    Power Wheelchair: standard joystick  []  NA  [x]   []   []   []   []   []   []   []   []   []   []    Power Wheelchair: alternative controls  []  NA  []   []   []   []   []   []    []   []   []   []   []    Summary:  The least costly alternative for independent functional mobility was found to be:    []  Crutch/Cane  []  Walker []  Manual w/c  []  Manual w/c with power assist   []  Scooter   [x]  Power w/c std joystick   []  Power w/c alternative control        []  Requires dependent care mobility device   Cabin Crew for [employer] skills are adequate for safe mobility equipment operation  [x]   Yes []   No  Patient is willing and motivated to use recommended mobility equipment  [x]   Yes []   No       []  Patient is unable to safely operate mobility equipment independently and requires dependent care equipment Comments:           SENSATION and SKIN ISSUES:  Sensation []  Intact  [x]  Impaired []  Absent []  Hyposensate []  Hypersensate  []  Defensiveness  Location(s) of impairment: B hands and B feet   Pressure Relief Method(s):  []  Lean side to side to offload (without risk of falling)  []   W/C push up (4+ times/hour for 15+ seconds) []  Stand up (without risk of falling)    []  Other: (Describe): Effective pressure relief method(s) above can be performed consistently throughout the day: [] Yes  [x]  No If not, Why?:  Skin Integrity Risk:       []  Low risk           []  Moderate risk            [x]  High risk  If high risk, explain: current stage III wound on his  R buttock, severely limited mobility impacting transfers to toilet, high fall risk, increased risk for macerated skin, unable to complete pressure relief methods safely   Skin Issues/Skin Integrity  Current skin Issues  [x]  Yes []  No []  Intact  []   Red area   [x]   Open area  []  Scar tissue  []  At risk from prolonged sitting  Where: R buttock History of Skin Issues  [x]  Yes []  No Where : R buttock When: current Stage: 3 Hx of skin flap surgeries  []  Yes [x]  No Where:  When:  Pain: [x]  Yes []  No   Pain Location(s): B shoulders, B LE Intensity scale: (0-10) : 5/10, as high as 9-10/10 How  does pain interfere with mobility and/or MRADLs? - unable to manually propel wc due to shoulder pain    MAT EVALUATION:  Neuro-Muscular Status: (Tone, Reflexive, Responses, etc.)     [x]   Intact   []  Spasticity:  []  Hypotonicity  []  Fluctuating  []  Muscle Spasms  []  Poor Righting Reactions/Poor Equilibrium Reactions  []  Primal Reflex(s):    Comments:            COMMENTS:    POSTURE:     Comments:  Pelvis Anterior/Posterior:  []  Neutral   [x]  Posterior  []  Anterior  []  Fixed - No movement [x]  Tendency away from neutral [x]  Flexible [x]  Self-correction [x]  External correction Obliquity (viewed from front)  [x]  WFL []  R Obliquity []  L Obliquity  []  Fixed - No movement []  Tendency away from neutral []  Flexible []  Self-correction []  External correction Rotation  []  WFL [x]  R anterior []  L anterior  []  Fixed - No movement [x]  Tendency away from neutral [x]  Flexible [x]  Self-correction []  External correction Tonal Influence Pelvis:  [x]  Normal []  Flaccid []  Low tone []  Spasticity []  Dystonia []  Pelvis thrust []  Other:    Trunk Anterior/Posterior:  []  WFL [x]  Thoracic kyphosis []  Lumbar lordosis  []  Fixed - No movement [x]  Tendency away from neutral [x]  Flexible [x]  Self-correction [x]  External correction  [x]  WFL []  Convex to left  []  Convex to right []  S-curve   []  C-curve []  Multiple curves []  Tendency away from neutral []  Flexible []  Self-correction []  External correction Rotation of shoulders and upper trunk:  [x]  Neutral []  Left-anterior []  Right- anterior []  Fixed- no movement []  Tendency away from neutral []  Flexible []  Self correction []  External correction Tonal influence Trunk:  [x]  Normal []  Flaccid []  Low tone []  Spasticity []  Dystonia []  Other:   Head & Neck  [x]  Functional [x]  Flexed    []  Extended []  Rotated right  []  Rotated left []  Laterally flexed right []  Laterally flexed left []  Cervical  hyperextension   []  Good head control [x]  Adequate head control []  Limited head control []  Absent head control Describe tone/movement of head and neck:      Lower Extremity Measurements:  LE MMT:  MMT Right 06/28/2024 Left 06/28/2024  Hip flexion 3- 3+  Hip extension    Hip abduction 3 4  Hip adduction 4 4  Knee flexion 3 3+  Knee extension 3+ 4-  Ankle dorsiflexion 3 3  Ankle plantarflexion     (Blank rows = not tested)  Hip positions:  [x]  Neutral   []  Abducted   []  Adducted  []  Subluxed   []  Dislocated   []  Fixed   []  Tendency away from neutral []  Flexible []  Self-correction []  External correction   Hip Windswept:[x]  Neutral  []  Right    []   Left  []  Subluxed   []  Dislocated   []  Fixed   []  Tendency away from neutral []  Flexible []  Self-correction []  External correction  LE Tone: [x]  Normal []  Low tone []  Spasticity []  Flaccid []  Dystonia []  Rocks/Extends at hip []  Thrust into knee extension []  Pushes legs downward into footrest  Foot positioning: ROM Concerns: Dorsiflexed: []  Right   []  Left Plantar flexed: []  Right    []  Left Inversion: [x]  Right    [x]  Left Eversion: []  Right    []  Left  LE Edema: []  1+ (Barely detectable impression when finger is pressed into skin) L LE: [x]  2+ (slight indentation. 15 seconds to rebound) R LE: [x]  3+ (deeper indentation. 30 seconds to rebound) []  4+ (>30 seconds to rebound)  UE Measurements:  R shoulder abduction: 37* L shoulder abduction: 42* R elbow flexion: 72* R elbow extension: lacking 37*  UPPER EXTREMITY MMT:  MMT Right 06/28/2024 Left 06/28/2024  Shoulder flexion 2 2  Shoulder abduction 2 2  Shoulder adduction    Elbow flexion  3  Elbow extension  3  Wrist flexion    Wrist extension    Pinch strength    Grip strength ~20lb ~15lb  (Blank rows = not tested)  Shoulder Posture:  Right Tendency towards Left  []   Functional []    []   Elevation []    [x]   Depression [x]    [x]   Protraction  [x]    []   Retraction []    [x]   Internal rotation [x]    []   External rotation []    []   Subluxed []     UE Tone: [x]  Normal []  Flaccid []  Low tone []  Spasticity  []  Dystonia []  Other:    Wrist/Hand: Handedness: [x]  Right   [x]  Left   []  NA: Comments: currently using L UE for ADLs due to R elbow limitations Right  Left  []   WNL []    [x]   Limitations [x]    []   Contractures []    []   Fisting []    []   Tremors []    [x]   Weak grasp [x]    [x]   Poor dexterity [x]    []   Hand movement non functional []    []   Paralysis []         MOBILITY BASE RECOMMENDATIONS and JUSTIFICATION:  MOBILITY BASE  JUSTIFICATION   Manufacturer:   Merits Model:                              Color:  Seat Width:   Seat Depth    []  Manual mobility base (continue below)   []  Scooter/POV  [x]  Power mobility base   Number of hours per day spent in above selected mobility base: ~8-10 hours  Typical daily mobility base use Schedule: Patient transfers into his wheelchair to complete any and all MRADLs.    [x]  is not a safe, functional ambulator  [x]  limitation prevents from completing a MRADL(s) within a reasonable time frame    [x]  limitation places at high risk of morbidity or mortality secondary to  the attempts to perform a    MRADL(s)  [x]  limitation prevents accomplishing a MRADL(s) entirely  [x]  provide independent mobility  [x]  equipment is a lifetime medical need  [x]  walker or cane inadequate  [x]  any type manual wheelchair      inadequate  [x]  scooter/POV inadequate      []  requires dependent mobility  POWER MOBILITY      []  Scooter/POV    []  can safely operate   []  can safely transfer   []  has adequate trunk stability   []  cannot functionally propel  manual wheelchair    [x]  Power mobility base    [x]  non-ambulatory   [x]  cannot functionally propel manual wheelchair   [x]  cannot functionally and safely      operate scooter/POV  [x]  can safely operate power       wheelchair   [x]  home is accessible  [x]  willing to use power wheelchair     Tilt  [x]  Powered tilt on powered chair  []  Powered tilt on manual chair  []  Manual tilt on manual chair Comments:  [x]  change position for pressure      [x]  elief/cannot weight shift   []  change position against      gravitational force on head and      shoulders   [x]  decrease pain  []  blood pressure management   []  control autonomic dysreflexia  []  decrease respiratory distress  []  management of spasticity  []  management of low tone  [x]  facilitate postural control   [x]  rest periods   [x]  control edema  [x]  increase sitting tolerance   []  aid with transfers     Recline   []  Power recline on power chair  []  Manual recline on manual chair  Comments:    []  intermittent catheterization  []  manage spasticity  []  accommodate femur to back angle  []  change position for pressure relief/cannot weight shift rhigh risk of pressure sore development  []  tilt alone does not accomplish     effective pressure relief, maximum pressure relief achieved at -      _______ degrees tilt   _______ degrees recline   []  difficult to transfer to and from bed []  rest periods and sleeping in chair  []  repositioning for transfers  []  bring to full recline for ADL care  []  clothing/diaper changes in chair  []  gravity PEG tube feeding  []  head positioning  []  decrease pain  []  blood pressure management   []  control autonomic dysreflexia  []  decrease respiratory distress  []  user on ventilator     Elevator on mobility base  []  Power wheelchair  []  Scooter  []  increase Indep in transfers   []  increase Indep in ADLs    []  bathroom function and safety  []  kitchen/cooking function and safety  []  shopping  []  raise height for communication at standing level  []  raise height for eye contact which reduces cervical neck strain and pain  []  drive at raised height for safety and navigating crowds  []  Other:   []  Vertical position system   (anterior tilt)     (Drive locks-out)    []  Stand       (Drive enabled)  []  independent weight bearing  []  decrease joint contractures  []  decrease/manage spasticity  []  decrease/manage spasms  []  pressure distribution away from   scapula, sacrum, coccyx, and ischial tuberosity  []  increase digestion and elimination   []  access to counters and cabinets  []  increase reach  []  increase interaction with others at eye level, reduces neck strain  []  increase performance of       MRADL(s)      Power elevating legrest    []  Center mount (Single) 85-170 degrees       []  Standard (Pair) 100-170 degrees  []  position legs at 90 degrees, not  available with std power ELR  []  center mount tucks into chair to decrease turning radius in home, not available with std power ELR  []  provide change in position for LE  []  elevate legs during recline    []  maintain placement of feet on      footplate  []  decrease edema  []  improve circulation  []  actuator needed to elevate legrest  []  actuator needed to articulate legrest preventing knees from flexing  []  Increase ground clearance over      curbs  []   STD (pair) independently                     elevate legrest   POWER WHEELCHAIR CONTROLS      Controls/input device  [x]  Expandable  []  Non-expandable  [x]  Proportional  [x]  Right Hand []  Left Hand  []  Non-proportional/switches/head-array  []  Electrical/proximity         []   Mechanical      Manufacturer:___________________   Type:________________________ [x]  provides access for controlling wheelchair  [x]  programming for accurate control  []  progressive disease/changing condition  []  required for alternative drive      controls       []  lacks motor control to operate  proportional drive control  []  unable to understand proportional controls  []  limited movement/strength  []  extraneous movement / tremors / ataxic / spastic       []  Upgraded electronics controller/harness    [x]  Single  power (tilt or recline)   [x]  Expandable    []  Non-expandable plus   []  Multi-power (tilt, recline, power legrest, power seat lift, vertical positioning system, stand)  [x]  allows input device to communicate with drive motors  [x]  harness provides necessary connections between the controller, input device, and seat functions     [x]  needed in order to operate power seat functions through joystick/ input device  []  required for alternative drive controls     []  Enhanced display  []  required to connect all alternative drive controls   []  required for upgraded joystick      (lite-throw, heavy duty, micro)  []  Allows user to see in which mode and drive the wheelchair is set; necessary for alternate controls       []  Upgraded tracking electronics  []  correct tracking when on uneven surfaces makes switch driving more efficient and less fatiguing  []  increase safety when driving  []  increase ability to traverse thresholds    []  Safety / reset / mode switches     Type:    []  Used to change modes and stop the wheelchair when driving     [x]  Mount for joystick / input device/switches  [x]  swing away for access or transfers   [x]  attaches joystick / input device / switches to wheelchair   [x]  provides for consistent access  []  midline for optimal placement    []  Attendant controlled joystick plus     mount  []  safety  []  long distance driving  []  operation of seat functions  []  compliance with transportation regulations    [x]  Battery  [x]  required to power (power assist / scooter/ power wc / other):   []  Power inverter (24V to 12V)  []  required for ventilator / respiratory equipment / other:     CHAIR OPTIONS MANUAL & POWER      Armrests   [x]  adjustable height []  removable  []  swing away []  fixed  [x]  flip back  []  reclining  []   full length pads [x]  desk []  tube arms []  gel pads  [x]  provide support with elbow at 90    [x]  remove/flip back/swing away for  transfers  [x]  provide support and  positioning of upper body    [x]  allow to come closer to table top  [x]  remove for access to tables  []  provide support for w/c tray  []  change of height/angles for variable activities   []  Elbow support / Elbow stop  []  keep elbow positioned on arm pad  []  keep arms from falling off arm pad  during tilt and/or recline   Upper Extremity Support  []  Arm trough  []   R  []   L  Style:  []  swivel mount []  fixed mount   []  posterior hand support  []   tray  []  full tray  []  joystick cut out  []   R  []   L  Style:  []  decrease gravitational pull on      shoulders  []  provide support to increase UE  function  []  provide hand support in natural    position  []  position flaccid UE  []  decrease subluxation    []  decrease edema       []  manage spasticity   []  provide midline positioning  []  provide work surface  []  placement for AAC/ Computer/ EADL       Hangers/ Legrests   []  ______ degree  []  Elevating []  articulating  []  swing away []  fixed []  lift off  []  heavy duty  []  adjustable knee angle  []  adjustable calf panel   []  longer extension tube              []  provide LE support  []  maintain placement of feet on      footplate   []  accommodate lower leg length  []  accommodate to hamstring       tightness  []  enable transfers  []  provide change in position for LE's  []  elevate legs during recline    []  decrease edema  []  durability      Foot support   [x]  footplate [x]  R [x]  L [x]  flip up           [x]  Depth adjustable   [x]  angle adjustable  []  foot board/one piece    [x]  provide foot support  [x]  accommodate to ankle ROM  [x]  allow foot to go under wheelchair base  [x]  enable transfers     []  Shoe holders  []  position foot    []  decrease / manage spasticity  []  control position of LE  []  stability    []  safety     []  Ankle strap/heel      loops  []  support foot on foot support  []  decrease extraneous movement  []  provide input to heel   []  protect foot     []  Amputee  adapter []  R  []  L     Style:                  Size:  []  Provide support for stump/residual extremity    []  Transportation tie-down  []  to provide crash tested tie-down brackets    []  Crutch/cane holder    []  O2 holder    []  IV hanger   []  Ventilator tray/mount    []  stabilize accessory on wheelchair       Component  Justification     [x]  Seat cushion     ROHO [x]   accommodate impaired sensation  [x]  decubitus ulcers present or history  [x]  unable to shift weight  [x]  increase pressure distribution  []  prevent pelvic extension  []  custom required "off-the-shelf"    seat cushion will not accommodate deformity  []  stabilize/promote pelvis alignment  []  stabilize/promote femur alignment  []  accommodate obliquity  []  accommodate multiple deformity  []  incontinent/accidents  []  low maintenance     []  seat mounts                 []  fixed []  removable  []  attach seat platform/cushion to wheelchair frame    []  Seat wedge    []  provide increased aggressiveness of seat shape to decrease sliding  down in the seat  []  accommodate ROM        []  Cover replacement   []  protect back or seat cushion  []  incontinent/accidents    []  Solid seat / insert    []  support cushion to prevent      hammocking  []  allows attachment of cushion to mobility base    []  Lateral pelvic/thigh/hip     support (Guides)     []  decrease abduction  []  accommodate pelvis  []  position upper legs  []  accommodate spasticity  []  removable for transfers     []  Lateral pelvic/thigh      supports mounts  []  fixed   []  swing-away   []  removable  []  mounts lateral pelvic/thigh supports     []  mounts lateral pelvic/thigh supports swing-away or removable for transfers    []  Medial thigh support ([place]mel)  [] decrease adduction  [] accommodate ROM  []  remove for transfers   []  alignment      []  Medial thigh   []  fixed      support mounts      []  swing-away   []  removable  []  mounts medial thigh supports   []  Mounts medial  supports swing- away or removable for transfers       Component  Justification   [x]  Back       [x]  provide posterior trunk support []  facilitate tone  [x]  provide lumbar/sacral support []  accommodate deformity  [x]  support trunk in midline   []  custom required "off-the-shelf" back support will not accommodate deformity   []  provide lateral trunk support []  accommodate or decrease tone            []  Back mounts  []  fixed  []  removable  []  attach back rest/cushion to wheelchair frame   []  Lateral trunk      supports  []  R []  L  []  decrease lateral trunk leaning  []  accommodate asymmetry    []  contour for increased contact  []  safety    []  control of tone    []  Lateral trunk      supports mounts  []  fixed  []  swing-away   []  removable  []  mounts lateral trunk supports     []  Mounts lateral trunk supports swing-away or removable for transfers   []  Anterior chest      strap, vest     []  decrease forward movement of shoulder  []  decrease forward movement of trunk  []  safety/stability  []  added abdominal support  []  trunk alignment  []  assistance with shoulder control   []  decrease shoulder elevation    [x]  Headrest      [x]  provide posterior head support  []  provide posterior neck support  []  provide lateral head support  []  provide anterior head  support  [x]  support during tilt and recline  []  improve feeding     []  improve respiration  []  placement of switches  [x]  safety    []  accommodate ROM   []  accommodate tone  []  improve visual orientation   [x]  Headrest           []  fixed [x]  removable []  flip down      Mounting hardware   []  swing-away laterals/switches  [x]  mount headrest   [x]  mounts headrest flip down or  removable for transfers  []  mount headrest swing-away laterals   []  mount switches     []  Neck Support    []  decrease neck rotation  []  decrease forward neck flexion   Pelvic Positioner    [x]  std hip belt          []  padded hip belt  []  dual pull hip  belt  []  four point hip belt  [x]  stabilize tone  [x]  decrease falling out of chair  []  prevent excessive extension  []  special pull angle to control      rotation  []  pad for protection over boney   prominence  []  promote comfort    []  Essential needs        bag/pouch   []  medicines []  special food rorthotics []  clothing changes  []  diapers  []  catheter/hygiene []  ostomy supplies   The above equipment has a life- long use expectancy.  Growth and changes in medical and/or functional conditions would be the exceptions.   SUMMARY:  Why mobility device was selected; include why a lower level device is not appropriate:    ASSESSMENT:  CLINICAL IMPRESSION: Patient is a 73 y.o. male who was seen today for physical therapy evaluation for a custom power wheelchair. He has been essentially wheelchair bound for many years due to multiple, significant joint surgeries. He transfers in and out of his wheelchair independently using a squat pivot method. He sustained a fall in the recent past resulting in a significant R humerus and ulna fracture leaving him with severely limited R elbow range of motion and arm strength. He is no longer able to efficiently propel a manual wheelchair, thus necessitating a power wheelchair. A custom power wheelchair will allow him to efficiently, safely and independently complete his MRADLs.  He requires power tilt on his power wheelchair in order to complete any amount of a pressure relief. 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 Given his poor upper extremity strength and range of motion- he cannot complete complete a wheelchair push up for a pressure relief. Given his limited trunk support (requiring upper extremity use to maintain seated balance on a therapy mat) he cannot sufficiently and safely lean to the side to complete a partial pressure relief. Given the extent of his joint surgeries, poor range of motion and  lower extremity strength, he cannot stand up to complete a full pressure relief. All of these contributing factors demonstrate a need for a power tilt feature on his wheelchair. He also has 2+ and 3+ pitting edema in his L and R lower extremities, respectively. Power tilt will allow him to recline and assist in managing this edema. Tilting will also improve the patients sitting tolerance and allow him to remain out of his bed for longer periods of the day, improving not only his participation in his MRADLs, but also his mental health. A headrest is necessary for safety  while he uses the tilt feature. The headrest should be removable for safe transfers in and out of his wheelchair as necessary.  A ROHO cushion will provide the patient with the greatest amount of pressure relief while seated in his power wheelchair. His Stage 3 pressure injury has the best chance of healing with the patient seated on a properly inflated ROHO cushion. He is unable to effectively complete any pressure reliefs and is dependent on his wheelchair for all mobility; this substantially increases his risk for worsening of his current pressure injury, or development of new ones.  He requires a back rest on his wheelchair to provide him with poster support while he is seated in his wheelchair completing his MRADLs. His arm rests should be height adjustable, flip back and desk-length. This will provide adequate support to his upper extremities. The flip back feature will allow for safe transfers in and out of his wheelchair. Desk-length arm rests will allow him to approach various work surfaces necessary for him to complete his MRADLs. His joystick should be mounted on his R, dominant, side and be swing away for safe transfers in and out of his wheelchair. A battery is essential for him to be able to use his custom power wheelchair. His footplate should be angle and depth adjustable, as well as flip up. This will accommodate his impaired foot  positioning, related to his multiple joint surgeries, but will also allow for safe transfers in and out of his wheelchair. A standard pelvic belt will help minimize his risk for falling out of his wheelchair.  A custom power wheelchair is a lifelong medical need for this patient. He is non-ambulatory and requires a wheelchair to complete any and all of his MRADLs. He is unable to use a manual wheelchair due to profoundly limited bilateral upper extremity strength and range of motion. He is currently suffering from a Stage 3 pressure injury that has the best chance of healing, and not worsening, if provided with tilt and a ROHO cushion on his custom power wheelchair. A custom power wheelchair will allow the patient to complete his MRADLs as efficiently, safe and independently as possible.    OBJECTIVE IMPAIRMENTS cardiopulmonary status limiting activity, decreased activity tolerance, decreased endurance, decreased knowledge of condition, decreased knowledge of use of DME, decreased mobility, difficulty walking, decreased ROM, decreased strength, impaired sensation, impaired UE functional use, impaired vision/preception, postural dysfunction, and pain.   ACTIVITY LIMITATIONS carrying, lifting, bending, standing, stairs, transfers, bed mobility, bathing, dressing, reach over head, hygiene/grooming, locomotion level, and caring for others  PARTICIPATION LIMITATIONS: meal prep, cleaning, laundry, interpersonal relationship, driving, shopping, community activity, and occupation  PERSONAL FACTORS Age, Behavior pattern, Fitness, Past/current experiences, Social background, Time since onset of injury/illness/exacerbation, and Transportation are also affecting patient's functional outcome.   REHAB POTENTIAL: Fair time since onset  CLINICAL DECISION MAKING: Stable/uncomplicated  EVALUATION COMPLEXITY: High         GOALS: One time visit. No goals established.    PLAN: PT FREQUENCY: one time  visit    Delon DELENA Pop, PT Delon DELENA Pop, PT, DPT, CBIS  06/28/2024, 11:41 AM    I concur with the above findings and recommendations of the therapist:  Physician name printed:         Physician's signature:      Date:

## 2024-07-06 ENCOUNTER — Ambulatory Visit: Admitting: Podiatry
